# Patient Record
Sex: Female | Born: 1940 | Race: Black or African American | Hispanic: No | Smoking: Current some day smoker
Health system: Southern US, Community
[De-identification: ages and names within clinical notes are randomized; demographics above are authoritative.]

## PROBLEM LIST (undated history)

## (undated) DIAGNOSIS — F32A Depression, unspecified: Secondary | ICD-10-CM

## (undated) DIAGNOSIS — E785 Hyperlipidemia, unspecified: Secondary | ICD-10-CM

## (undated) DIAGNOSIS — M810 Age-related osteoporosis without current pathological fracture: Secondary | ICD-10-CM

## (undated) DIAGNOSIS — G459 Transient cerebral ischemic attack, unspecified: Secondary | ICD-10-CM

## (undated) DIAGNOSIS — D649 Anemia, unspecified: Secondary | ICD-10-CM

## (undated) DIAGNOSIS — T7840XA Allergy, unspecified, initial encounter: Secondary | ICD-10-CM

## (undated) DIAGNOSIS — K635 Polyp of colon: Secondary | ICD-10-CM

## (undated) DIAGNOSIS — M0520 Rheumatoid vasculitis with rheumatoid arthritis of unspecified site: Secondary | ICD-10-CM

## (undated) DIAGNOSIS — E079 Disorder of thyroid, unspecified: Secondary | ICD-10-CM

## (undated) DIAGNOSIS — C801 Malignant (primary) neoplasm, unspecified: Secondary | ICD-10-CM

## (undated) DIAGNOSIS — F329 Major depressive disorder, single episode, unspecified: Secondary | ICD-10-CM

## (undated) DIAGNOSIS — H269 Unspecified cataract: Secondary | ICD-10-CM

## (undated) DIAGNOSIS — I10 Essential (primary) hypertension: Secondary | ICD-10-CM

## (undated) DIAGNOSIS — I639 Cerebral infarction, unspecified: Secondary | ICD-10-CM

## (undated) HISTORY — DX: Major depressive disorder, single episode, unspecified: F32.9

## (undated) HISTORY — DX: Disorder of thyroid, unspecified: E07.9

## (undated) HISTORY — DX: Anemia, unspecified: D64.9

## (undated) HISTORY — PX: CATARACT EXTRACTION: SUR2

## (undated) HISTORY — DX: Transient cerebral ischemic attack, unspecified: G45.9

## (undated) HISTORY — DX: Rheumatoid vasculitis with rheumatoid arthritis of unspecified site: M05.20

## (undated) HISTORY — PX: TUMOR REMOVAL: SHX12

## (undated) HISTORY — DX: Allergy, unspecified, initial encounter: T78.40XA

## (undated) HISTORY — DX: Cerebral infarction, unspecified: I63.9

## (undated) HISTORY — DX: Unspecified cataract: H26.9

## (undated) HISTORY — DX: Polyp of colon: K63.5

## (undated) HISTORY — DX: Hyperlipidemia, unspecified: E78.5

## (undated) HISTORY — DX: Depression, unspecified: F32.A

## (undated) HISTORY — DX: Essential (primary) hypertension: I10

---

## 1974-11-09 HISTORY — PX: ABDOMINAL HYSTERECTOMY: SHX81

## 1993-11-09 DIAGNOSIS — C801 Malignant (primary) neoplasm, unspecified: Secondary | ICD-10-CM

## 1993-11-09 HISTORY — DX: Malignant (primary) neoplasm, unspecified: C80.1

## 2008-02-28 LAB — HM COLONOSCOPY

## 2009-07-01 LAB — CONVERTED CEMR LAB: Pap Smear: NORMAL

## 2009-08-13 ENCOUNTER — Ambulatory Visit (HOSPITAL_BASED_OUTPATIENT_CLINIC_OR_DEPARTMENT_OTHER): Admission: RE | Admit: 2009-08-13 | Discharge: 2009-08-13 | Payer: Self-pay | Admitting: Rheumatology

## 2009-08-13 ENCOUNTER — Ambulatory Visit: Payer: Self-pay | Admitting: Diagnostic Radiology

## 2009-12-17 ENCOUNTER — Ambulatory Visit: Payer: Self-pay | Admitting: Family

## 2009-12-17 ENCOUNTER — Telehealth: Payer: Self-pay | Admitting: Family

## 2009-12-17 DIAGNOSIS — M810 Age-related osteoporosis without current pathological fracture: Secondary | ICD-10-CM | POA: Insufficient documentation

## 2009-12-17 DIAGNOSIS — I10 Essential (primary) hypertension: Secondary | ICD-10-CM | POA: Insufficient documentation

## 2009-12-17 DIAGNOSIS — M069 Rheumatoid arthritis, unspecified: Secondary | ICD-10-CM | POA: Insufficient documentation

## 2009-12-17 DIAGNOSIS — E559 Vitamin D deficiency, unspecified: Secondary | ICD-10-CM | POA: Insufficient documentation

## 2009-12-17 DIAGNOSIS — F17200 Nicotine dependence, unspecified, uncomplicated: Secondary | ICD-10-CM | POA: Insufficient documentation

## 2009-12-17 DIAGNOSIS — F329 Major depressive disorder, single episode, unspecified: Secondary | ICD-10-CM

## 2009-12-17 DIAGNOSIS — R32 Unspecified urinary incontinence: Secondary | ICD-10-CM | POA: Insufficient documentation

## 2009-12-17 DIAGNOSIS — E785 Hyperlipidemia, unspecified: Secondary | ICD-10-CM | POA: Insufficient documentation

## 2009-12-17 DIAGNOSIS — F32A Depression, unspecified: Secondary | ICD-10-CM | POA: Insufficient documentation

## 2009-12-17 DIAGNOSIS — D509 Iron deficiency anemia, unspecified: Secondary | ICD-10-CM | POA: Insufficient documentation

## 2009-12-17 DIAGNOSIS — J309 Allergic rhinitis, unspecified: Secondary | ICD-10-CM | POA: Insufficient documentation

## 2009-12-18 ENCOUNTER — Ambulatory Visit: Payer: Self-pay | Admitting: Family

## 2009-12-18 LAB — CONVERTED CEMR LAB
ALT: 9 units/L (ref 0–35)
AST: 13 units/L (ref 0–37)
Albumin: 4.2 g/dL (ref 3.5–5.2)
Alkaline Phosphatase: 66 units/L (ref 39–117)
BUN: 13 mg/dL (ref 6–23)
Basophils Absolute: 0 10*3/uL (ref 0.0–0.1)
Basophils Relative: 0 % (ref 0–1)
Bilirubin, Direct: 0.1 mg/dL (ref 0.0–0.3)
CO2: 26 meq/L (ref 19–32)
Calcium: 10 mg/dL (ref 8.4–10.5)
Chloride: 106 meq/L (ref 96–112)
Cholesterol: 251 mg/dL — ABNORMAL HIGH (ref 0–200)
Creatinine, Ser: 0.81 mg/dL (ref 0.40–1.20)
Eosinophils Absolute: 0.3 10*3/uL (ref 0.0–0.7)
Eosinophils Relative: 4 % (ref 0–5)
Glucose, Bld: 89 mg/dL (ref 70–99)
HCT: 40.4 % (ref 36.0–46.0)
HDL: 42 mg/dL (ref >39)
Hemoglobin: 12.7 g/dL (ref 12.0–15.0)
Indirect Bilirubin: 0.6 mg/dL (ref 0.0–0.9)
LDL Cholesterol: 174 mg/dL — ABNORMAL HIGH (ref 0–99)
Lymphocytes Relative: 37 % (ref 12–46)
Lymphs Abs: 2.7 10*3/uL (ref 0.7–4.0)
MCHC: 31.4 g/dL (ref 30.0–36.0)
MCV: 88.4 fL (ref 78.0–100.0)
Monocytes Absolute: 0.6 10*3/uL (ref 0.1–1.0)
Monocytes Relative: 8 % (ref 3–12)
Neutro Abs: 3.7 10*3/uL (ref 1.7–7.7)
Neutrophils Relative %: 50 % (ref 43–77)
Platelets: 319 10*3/uL (ref 150–400)
Potassium: 4.5 meq/L (ref 3.5–5.3)
RBC: 4.57 M/uL (ref 3.87–5.11)
RDW: 16.1 % — ABNORMAL HIGH (ref 11.5–15.5)
Sodium: 143 meq/L (ref 135–145)
Total Bilirubin: 0.7 mg/dL (ref 0.3–1.2)
Total CHOL/HDL Ratio: 6
Total Protein: 7.3 g/dL (ref 6.0–8.3)
Triglycerides: 174 mg/dL — ABNORMAL HIGH (ref <150)
VLDL: 35 mg/dL (ref 0–40)
Vit D, 1,25-Dihydroxy: 17 — ABNORMAL LOW (ref 30–89)
WBC: 7.3 10*3/uL (ref 4.0–10.5)

## 2009-12-20 ENCOUNTER — Telehealth (INDEPENDENT_AMBULATORY_CARE_PROVIDER_SITE_OTHER): Payer: Self-pay | Admitting: *Deleted

## 2009-12-23 ENCOUNTER — Telehealth: Payer: Self-pay | Admitting: Family

## 2009-12-26 ENCOUNTER — Telehealth: Payer: Self-pay | Admitting: Family

## 2009-12-27 ENCOUNTER — Ambulatory Visit: Payer: Self-pay | Admitting: Family Medicine

## 2009-12-30 ENCOUNTER — Ambulatory Visit: Payer: Self-pay | Admitting: Internal Medicine

## 2010-01-01 ENCOUNTER — Ambulatory Visit: Payer: Self-pay

## 2010-01-01 ENCOUNTER — Telehealth: Payer: Self-pay | Admitting: Family

## 2010-01-01 ENCOUNTER — Ambulatory Visit: Payer: Self-pay | Admitting: Cardiology

## 2010-01-02 ENCOUNTER — Encounter: Payer: Self-pay | Admitting: Family

## 2010-01-15 ENCOUNTER — Ambulatory Visit: Payer: Self-pay | Admitting: Family

## 2010-01-15 LAB — CONVERTED CEMR LAB
BUN: 20 mg/dL (ref 6–23)
Basophils Absolute: 0 10*3/uL (ref 0.0–0.1)
Basophils Relative: 0 % (ref 0–1)
CO2: 28 meq/L (ref 19–32)
Calcium: 10.3 mg/dL (ref 8.4–10.5)
Chloride: 106 meq/L (ref 96–112)
Creatinine, Ser: 0.94 mg/dL (ref 0.40–1.20)
Eosinophils Absolute: 0.3 10*3/uL (ref 0.0–0.7)
Eosinophils Relative: 4 % (ref 0–5)
Glucose, Bld: 97 mg/dL (ref 70–99)
HCT: 40.4 % (ref 36.0–46.0)
Hemoglobin: 12.8 g/dL (ref 12.0–15.0)
INR: 1.04 (ref <1.50)
Lymphocytes Relative: 37 % (ref 12–46)
Lymphs Abs: 3.1 10*3/uL (ref 0.7–4.0)
MCHC: 31.7 g/dL (ref 30.0–36.0)
MCV: 88.2 fL (ref 78.0–100.0)
Monocytes Absolute: 0.6 10*3/uL (ref 0.1–1.0)
Monocytes Relative: 7 % (ref 3–12)
Neutro Abs: 4.4 10*3/uL (ref 1.7–7.7)
Neutrophils Relative %: 53 % (ref 43–77)
Platelets: 300 10*3/uL (ref 150–400)
Potassium: 4 meq/L (ref 3.5–5.3)
Prothrombin Time: 13.5 s (ref 11.6–15.2)
RBC: 4.58 M/uL (ref 3.87–5.11)
RDW: 15.2 % (ref 11.5–15.5)
Sodium: 143 meq/L (ref 135–145)
WBC: 8.4 10*3/uL (ref 4.0–10.5)
aPTT: 24 s (ref 24–37)

## 2010-01-16 ENCOUNTER — Encounter: Payer: Self-pay | Admitting: Family

## 2010-02-20 ENCOUNTER — Telehealth: Payer: Self-pay | Admitting: Family

## 2010-02-21 ENCOUNTER — Telehealth: Payer: Self-pay | Admitting: Family

## 2010-04-14 ENCOUNTER — Ambulatory Visit (HOSPITAL_BASED_OUTPATIENT_CLINIC_OR_DEPARTMENT_OTHER): Admission: RE | Admit: 2010-04-14 | Discharge: 2010-04-14 | Payer: Self-pay | Admitting: Internal Medicine

## 2010-04-14 ENCOUNTER — Ambulatory Visit: Payer: Self-pay | Admitting: Diagnostic Radiology

## 2010-04-14 ENCOUNTER — Ambulatory Visit: Payer: Self-pay | Admitting: Family

## 2010-04-14 LAB — CONVERTED CEMR LAB
ALT: 10 units/L (ref 0–35)
AST: 15 units/L (ref 0–37)
Albumin: 4.3 g/dL (ref 3.5–5.2)
Alkaline Phosphatase: 60 units/L (ref 39–117)
BUN: 13 mg/dL (ref 6–23)
CO2: 28 meq/L (ref 19–32)
Calcium: 9.9 mg/dL (ref 8.4–10.5)
Chloride: 104 meq/L (ref 96–112)
Creatinine, Ser: 0.76 mg/dL (ref 0.40–1.20)
Glucose, Bld: 87 mg/dL (ref 70–99)
Potassium: 4.6 meq/L (ref 3.5–5.3)
Sodium: 142 meq/L (ref 135–145)
Total Bilirubin: 0.5 mg/dL (ref 0.3–1.2)
Total Protein: 7.3 g/dL (ref 6.0–8.3)

## 2010-04-14 LAB — HM MAMMOGRAPHY: HM Mammogram: NEGATIVE

## 2010-04-21 ENCOUNTER — Encounter: Payer: Self-pay | Admitting: Family

## 2010-04-21 LAB — CONVERTED CEMR LAB
Cholesterol: 230 mg/dL — ABNORMAL HIGH (ref 0–200)
HDL: 41 mg/dL (ref >39)
LDL Cholesterol: 151 mg/dL — ABNORMAL HIGH (ref 0–99)
Total CHOL/HDL Ratio: 5.6
Triglycerides: 190 mg/dL — ABNORMAL HIGH (ref <150)
VLDL: 38 mg/dL (ref 0–40)

## 2010-04-22 ENCOUNTER — Encounter: Payer: Self-pay | Admitting: Family

## 2010-05-13 ENCOUNTER — Encounter: Payer: Self-pay | Admitting: Family

## 2010-07-21 ENCOUNTER — Ambulatory Visit: Payer: Self-pay | Admitting: Family

## 2010-07-21 LAB — CONVERTED CEMR LAB
ALT: 9 units/L (ref 0–35)
AST: 11 units/L (ref 0–37)
Albumin: 4.2 g/dL (ref 3.5–5.2)
Alkaline Phosphatase: 53 units/L (ref 39–117)
Bilirubin, Direct: 0.1 mg/dL (ref 0.0–0.3)
Cholesterol: 219 mg/dL — ABNORMAL HIGH (ref 0–200)
HDL: 51 mg/dL (ref >39)
Indirect Bilirubin: 0.4 mg/dL (ref 0.0–0.9)
LDL Cholesterol: 149 mg/dL — ABNORMAL HIGH (ref 0–99)
Total Bilirubin: 0.5 mg/dL (ref 0.3–1.2)
Total CHOL/HDL Ratio: 4.3
Total Protein: 6.9 g/dL (ref 6.0–8.3)
Triglycerides: 96 mg/dL (ref <150)
VLDL: 19 mg/dL (ref 0–40)

## 2010-07-22 ENCOUNTER — Telehealth: Payer: Self-pay | Admitting: Family

## 2010-08-18 ENCOUNTER — Ambulatory Visit: Payer: Self-pay | Admitting: Family

## 2010-08-18 LAB — CONVERTED CEMR LAB
BUN: 20 mg/dL (ref 6–23)
CO2: 28 meq/L (ref 19–32)
Calcium: 9.9 mg/dL (ref 8.4–10.5)
Chloride: 104 meq/L (ref 96–112)
Cholesterol: 205 mg/dL — ABNORMAL HIGH (ref 0–200)
Creatinine, Ser: 0.84 mg/dL (ref 0.40–1.20)
Glucose, Bld: 103 mg/dL — ABNORMAL HIGH (ref 70–99)
HDL: 47 mg/dL (ref >39)
LDL Cholesterol: 124 mg/dL — ABNORMAL HIGH (ref 0–99)
Potassium: 4.6 meq/L (ref 3.5–5.3)
Sodium: 143 meq/L (ref 135–145)
Total CHOL/HDL Ratio: 4.4
Triglycerides: 170 mg/dL — ABNORMAL HIGH (ref <150)
VLDL: 34 mg/dL (ref 0–40)

## 2010-08-19 ENCOUNTER — Encounter: Payer: Self-pay | Admitting: Family

## 2010-08-31 ENCOUNTER — Encounter: Payer: Self-pay | Admitting: Family

## 2010-09-01 ENCOUNTER — Encounter: Payer: Self-pay | Admitting: Family

## 2010-09-02 ENCOUNTER — Encounter: Payer: Self-pay | Admitting: Family

## 2010-09-08 ENCOUNTER — Encounter: Payer: Self-pay | Admitting: Family

## 2010-09-08 ENCOUNTER — Telehealth: Payer: Self-pay | Admitting: Family

## 2010-09-17 ENCOUNTER — Encounter: Payer: Self-pay | Admitting: Family

## 2010-09-17 ENCOUNTER — Ambulatory Visit: Payer: Self-pay | Admitting: Internal Medicine

## 2010-09-26 ENCOUNTER — Telehealth: Payer: Self-pay | Admitting: Family

## 2010-10-14 ENCOUNTER — Ambulatory Visit: Payer: Self-pay | Admitting: Family

## 2010-10-14 DIAGNOSIS — R413 Other amnesia: Secondary | ICD-10-CM | POA: Insufficient documentation

## 2010-10-14 DIAGNOSIS — R55 Syncope and collapse: Secondary | ICD-10-CM | POA: Insufficient documentation

## 2010-10-14 DIAGNOSIS — F411 Generalized anxiety disorder: Secondary | ICD-10-CM | POA: Insufficient documentation

## 2010-10-14 LAB — CONVERTED CEMR LAB
Folate: 15.9 ng/mL
Vitamin B-12: 213 pg/mL (ref 211–911)

## 2010-10-15 ENCOUNTER — Telehealth: Payer: Self-pay | Admitting: Family

## 2010-11-12 ENCOUNTER — Ambulatory Visit: Admit: 2010-11-12 | Payer: Self-pay | Admitting: Family

## 2010-11-12 ENCOUNTER — Ambulatory Visit
Admission: RE | Admit: 2010-11-12 | Discharge: 2010-11-12 | Payer: Self-pay | Source: Home / Self Care | Attending: Family | Admitting: Family

## 2010-11-12 LAB — CONVERTED CEMR LAB
Basophils Absolute: 0 10*3/uL (ref 0.0–0.1)
Basophils Relative: 0 % (ref 0–1)
Eosinophils Absolute: 0.3 10*3/uL (ref 0.0–0.7)
Eosinophils Relative: 4 % (ref 0–5)
HCT: 37.1 % (ref 36.0–46.0)
Hemoglobin: 11.6 g/dL — ABNORMAL LOW (ref 12.0–15.0)
Lymphocytes Relative: 28 % (ref 12–46)
Lymphs Abs: 2.6 10*3/uL (ref 0.7–4.0)
MCHC: 31.3 g/dL (ref 30.0–36.0)
MCV: 87.7 fL (ref 78.0–100.0)
Monocytes Absolute: 0.8 10*3/uL (ref 0.1–1.0)
Monocytes Relative: 9 % (ref 3–12)
Neutro Abs: 5.5 10*3/uL (ref 1.7–7.7)
Neutrophils Relative %: 59 % (ref 43–77)
Platelets: 299 10*3/uL (ref 150–400)
RBC: 4.23 M/uL (ref 3.87–5.11)
RDW: 15.8 % — ABNORMAL HIGH (ref 11.5–15.5)
WBC: 9.2 10*3/uL (ref 4.0–10.5)

## 2010-11-14 ENCOUNTER — Telehealth: Payer: Self-pay | Admitting: Family

## 2010-11-14 ENCOUNTER — Encounter: Payer: Self-pay | Admitting: Family

## 2010-11-14 LAB — CONVERTED CEMR LAB
Ferritin: 28 ng/mL (ref 10–291)
Folate: 13.5 ng/mL
Iron: 57 ug/dL (ref 42–145)
Saturation Ratios: 18 % — ABNORMAL LOW (ref 20–55)
TIBC: 320 ug/dL (ref 250–470)
UIBC: 263 ug/dL
Vitamin B-12: 188 pg/mL — ABNORMAL LOW (ref 211–911)

## 2010-11-19 ENCOUNTER — Telehealth: Payer: Self-pay | Admitting: Family

## 2010-11-19 DIAGNOSIS — D518 Other vitamin B12 deficiency anemias: Secondary | ICD-10-CM | POA: Insufficient documentation

## 2010-11-28 ENCOUNTER — Other Ambulatory Visit: Payer: Self-pay | Admitting: Family

## 2010-11-28 ENCOUNTER — Ambulatory Visit
Admission: RE | Admit: 2010-11-28 | Discharge: 2010-11-28 | Payer: Self-pay | Source: Home / Self Care | Attending: Family | Admitting: Family

## 2010-11-28 LAB — FECAL OCCULT BLOOD, IMMUNOCHEMICAL: Fecal Occult Bld: NEGATIVE

## 2010-11-30 ENCOUNTER — Encounter: Payer: Self-pay | Admitting: Family

## 2010-12-09 NOTE — Assessment & Plan Note (Signed)
Summary: headache/swh   Vital Signs:  Patient profile:   70 year old female Weight:      143 pounds Pulse rate:   62 / minute Pulse rhythm:   regular BP sitting:   140 / 88  (left arm) Cuff size:   regular  Vitals Entered By: Army Fossa CMA (December 27, 2009 1:28 PM) CC: Pt states she is still having real bad HA on the left side ongoing over the past 3 weeks has been on an ATB(amoxicillin) , URI symptoms Comments Saw melissa originally for this   History of Present Illness:       This is a 70 year old woman who presents with URI symptoms.  The symptoms began 3 weeks ago.  Pt took amoxicillin ---see last visit.  The patient complains of nasal congestion, purulent nasal discharge, sore throat, productive cough, and earache.  Associated symptoms include fever.  The patient denies low-grade fever (<100.5 degrees), fever of 100.5-103 degrees, fever of 103.1-104 degrees, fever to >104 degrees, stiff neck, dyspnea, wheezing, rash, vomiting, diarrhea, use of an antipyretic, and response to antipyretic.  The patient also reports headache.  The patient denies itchy watery eyes, itchy throat, sneezing, seasonal symptoms, response to antihistamine, muscle aches, and severe fatigue.  Risk factors for Strep sinusitis include unilateral facial pain.  The patient denies the following risk factors for Strep sinusitis: unilateral nasal discharge, poor response to decongestant, double sickening, tooth pain, Strep exposure, tender adenopathy, and absence of cough.    Current Medications (verified): 1)  Metoprolol Succinate 50 Mg Xr24h-Tab (Metoprolol Succinate) .... Take 1 Tablet By Mouth Once A Day 2)  Methotrexate 2.5 Mg Tabs (Methotrexate Sodium) .... 8 Tablets By Mouth Once Weekly 3)  Pravastatin Sodium 40 Mg Tabs (Pravastatin Sodium) .... Take 1 Tab By Mouth At Bedtime 4)  Folic Acid 1 Mg Tabs (Folic Acid) .... Take 1 Tablet By Mouth Once A Day 5)  Caltrate 600+d 600-400 Mg-Unit Tabs (Calcium  Carbonate-Vitamin D) .... One Tablet By Mouth Bid 6)  Zyban 150 Mg Xr12h-Tab (Bupropion Hcl (Smoking Deter)) .... One Tab By Mouth Daily X 3 Days, Then Increase To One Tablet By Mouth Two Times A Day X 7-12 Weeks 7)  Pravastatin Sodium 40 Mg Tabs (Pravastatin Sodium) .... One Tab By Mouth Daily At Bedtime 8)  Levaquin 500 Mg Tabs (Levofloxacin) .Marland Kitchen.. 1 By Mouth Once Daily 9)  Prednisone 10 Mg Tabs (Prednisone) .... 3 By Mouth Once Daily For 3 Days Then 2 By Mouth Once Daily For 3 Days Then 1 By Mouth Once Daily For 3 Days  Allergies (verified): No Known Drug Allergies  Past History:  Past medical, surgical, family and social histories (including risk factors) reviewed for relevance to current acute and chronic problems.  Past Medical History: Reviewed history from 12/17/2009 and no changes required. Allergic rhinitis Anemia-iron deficiency Depression Hyperlipidemia Hypertension Urinary incontinence Rheumatoid arthritis  Past Surgical History: Reviewed history from 12/17/2009 and no changes required. Hysterectomy  Family History: Reviewed history from 12/17/2009 and no changes required. Family History of Arthritis Family History of CAD Female 1st degree relative <50 Family History High cholesterol Family History Hypertension Family History of Stroke F 1st degree relative <60  Mom- deceased at 70, had colostomy due to severe GI bleed Dad-deceased at 89, HTN, ?cancer sister- CABG x 3 at age 68.  Brother- cirrhosis, ETOH abuse 2 additional sisters- one with arthritis (osteoarthritis) one sister with HTN  Daugher- colitis Son with HTN, kidney transplant- due to  kidney problems after deset storm. 3 daughters with thyroid problems  Social History: Reviewed history from 12/17/2009 and no changes required. Divorced 3 sons  5 daughters Retired from [employer] denies ETOH 1/2 PPD x 32 years Denies drug use.  Review of Systems      See HPI  Physical Exam  General:   Well-developed,well-nourished,in no acute distress; alert,appropriate and cooperative throughout examination Ears:  External ear exam shows no significant lesions or deformities.  Otoscopic examination reveals clear canals, tympanic membranes are intact bilaterally without bulging, retraction, inflammation or discharge. Hearing is grossly normal bilaterally. Nose:  L frontal sinus tenderness and L maxillary sinus tenderness.   Mouth:  Oral mucosa and oropharynx without lesions or exudates.  Teeth in good repair. Neck:  No deformities, masses, or tenderness noted. Lungs:  Normal respiratory effort, chest expands symmetrically. Lungs are clear to auscultation, no crackles or wheezes. Heart:  normal rate and no murmur.   Psych:  Oriented X3 and normally interactive.     Impression & Recommendations:  Problem # 1:  SINUSITIS (ICD-473.9)  Her updated medication list for this problem includes:    Levaquin 500 Mg Tabs (Levofloxacin) .Marland Kitchen... 1 by mouth once daily  Take antibiotics for full duration. Discussed treatment options including indications for coronal CT scan of sinuses and ENT referral.   Complete Medication List: 1)  Metoprolol Succinate 50 Mg Xr24h-tab (Metoprolol succinate) .... Take 1 tablet by mouth once a day 2)  Methotrexate 2.5 Mg Tabs (Methotrexate sodium) .... 8 tablets by mouth once weekly 3)  Pravastatin Sodium 40 Mg Tabs (Pravastatin sodium) .... Take 1 tab by mouth at bedtime 4)  Folic Acid 1 Mg Tabs (Folic acid) .... Take 1 tablet by mouth once a day 5)  Caltrate 600+d 600-400 Mg-unit Tabs (Calcium carbonate-vitamin d) .... One tablet by mouth bid 6)  Zyban 150 Mg Xr12h-tab (Bupropion hcl (smoking deter)) .... One tab by mouth daily x 3 days, then increase to one tablet by mouth two times a day x 7-12 weeks 7)  Pravastatin Sodium 40 Mg Tabs (Pravastatin sodium) .... One tab by mouth daily at bedtime 8)  Levaquin 500 Mg Tabs (Levofloxacin) .Marland Kitchen.. 1 by mouth once daily 9)   Prednisone 10 Mg Tabs (Prednisone) .... 3 by mouth once daily for 3 days then 2 by mouth once daily for 3 days then 1 by mouth once daily for 3 days  Other Orders: Radiology Referral (Radiology) Admin of Therapeutic Inj  intramuscular or subcutaneous (16109) Depo- Medrol 80mg  (J1040) Prescriptions: PREDNISONE 10 MG TABS (PREDNISONE) 3 by mouth once daily for 3 days then 2 by mouth once daily for 3 days then 1 by mouth once daily for 3 days  #18 x 0   Entered and Authorized by:   Loreen Freud DO   Signed by:   Loreen Freud DO on 12/27/2009   Method used:   [facility] [phone]* (retail)       [address] [place]  [address]       Ph: [phone] or [phone]       Fax: [phone]   RxID:   [id number] LEVAQUIN 500 MG TABS (LEVOFLOXACIN) 1 by mouth once daily  #10 x 0   Entered and Authorized by:   Loreen Freud DO   Signed by:   Loreen Freud DO on 12/27/2009   Method used:   [facility]  to        [facility]. [phone]* (retail)       [address] [place]  [address]       Ph: [phone] or [phone]       Fax: [phone]   RxID:   [id number]    Medication Administration  Injection # 1:    Medication: Depo- Medrol 80mg     Diagnosis: HEADACHE (ICD-784.0)    Route: IM    Site: RUOQ gluteus    Exp Date: 12/2010    Lot #: Madelaine Bhat    Mfr: novaplus    Patient tolerated injection without complications    Given by: Army Fossa CMA (December 27, 2009 2:11 PM)  Orders Added: 1)  Radiology Referral [Radiology] 2)  Admin of Therapeutic Inj  intramuscular or subcutaneous [96372] 3)  Depo- Medrol 80mg  [J1040] 4)  Est. Patient Level III [36644]

## 2010-12-09 NOTE — Letter (Signed)
Summary: [facility]  [facility]   Imported By: Lanelle Bal 05/26/2010 08:42:28  _____________________________________________________________________  External Attachment:    Type:   Image     Comment:   External Document

## 2010-12-09 NOTE — Assessment & Plan Note (Signed)
Summary: 1 MONTH FOLLOW UP/[facility]--Rm 5   Vital Signs:  Patient profile:   70 year old female Height:      66.5 inches Weight:      144.50 pounds BMI:     23.06 Temp:     97.8 degrees F oral Pulse rate:   66 / minute Pulse rhythm:   regular Resp:     16 per minute BP sitting:   134 / 82  (right arm) Cuff size:   regular  Vitals Entered By: Mervin Kung CMA Duncan Dull) (August 18, 2010 8:01 AM) CC: rm 5   1 month f/u, Hypertension Management Is Patient Diabetic? No Pain Assessment Patient in pain? no      Comments Pt states she has had intermittent onstipation x 2 yrs but recently is getting worse.  Pt will need rx for Crestor sent to pharmacy. Pt completed Pravastatin.  Nicki Guadalajara Fergerson CMA Duncan Dull)  August 18, 2010 8:08 AM    Primary Care Provider:  Lemont Fillers FNP  CC:  rm 5   1 month f/u and Hypertension Management.  History of Present Illness: Ms Sallas is a 70 year old female who presents today for follow up.    1. Hyperlipidemia-Denies muscle pain, reports that she has been compliant with the Pravastatin 80mg .  "I might have missed one or two doses."  2. RA- Pt is following with Dr. Corliss Skains- on Methotrexate.  Feels like this is well controlled  3. Constipation-  notes that she sometimes will go as long as one week without a BM  4. Osteoporosis- was on fosamax briefly, discontinued due to  concern re: atypical fractures.   Hypertension History:      She complains of headache, but denies chest pain, palpitations, dyspnea with exertion, and peripheral edema.  Further comments include: mild sinus HA.        Positive major cardiovascular risk factors include female age 50 years old or older, hyperlipidemia, hypertension, and current tobacco user.     Allergies: No Known Drug Allergies  Past History:  Past Medical History: Last updated: 12/17/2009 Allergic rhinitis Anemia-iron deficiency Depression Hyperlipidemia Hypertension Urinary  incontinence Rheumatoid arthritis  Past Surgical History: Last updated: 12/17/2009 Hysterectomy  Review of Systems       see HPI  Physical Exam  General:  Well-developed,well-nourished,in no acute distress; alert,appropriate and cooperative throughout examination Lungs:  Normal respiratory effort, chest expands symmetrically. Lungs are clear to auscultation, no crackles or wheezes. Heart:  Normal rate and regular rhythm. S1 and S2 normal without gallop, murmur, click, rub or other extra sounds. Abdomen:  Bowel sounds positive,abdomen soft and non-tender without masses, organomegaly or hernias noted.   Impression & Recommendations:  Problem # 1:  CONSTIPATION (ICD-564.00)  Her updated medication list for this problem includes:    Metamucil 0.52 Gm Caps (Psyllium) ..... Start 2 caps by mouth once daily.  may increase up to 5 caps once daily as needed for regularity  Problem # 2:  HYPERTENSION (ICD-401.9)  Her updated medication list for this problem includes:    Metoprolol Succinate 50 Mg Xr24h-tab (Metoprolol succinate) .Marland Kitchen... Take 1 tablet by mouth once a day    Hydrochlorothiazide 25 Mg Tabs (Hydrochlorothiazide) ..... One tablet by mouth daily  Orders: TLB-BMP (Basic Metabolic Panel-BMET) (80048-METABOL) Prescription Created Electronically [phone])  Problem # 3:  HYPERLIPIDEMIA (ICD-272.4)  The following medications were removed from the medication list:    Crestor 10 Mg Tabs (Rosuvastatin calcium) ..... One tablet by mouth daily Her  updated medication list for this problem includes:    Pravastatin Sodium 80 Mg Tabs (Pravastatin sodium) ..... One tab by mouth once daily in the evening  Orders: TLB-Lipid Panel (80061-LIPID)  Problem # 4:  OSTEOPOROSIS (ICD-733.00) Assessment: Comment Only Will order f/u dexascan, may consider prolia or reclast.  Complete Medication List: 1)  Metoprolol Succinate 50 Mg Xr24h-tab (Metoprolol succinate) .... Take 1 tablet by mouth once  a day 2)  Methotrexate 2.5 Mg Tabs (Methotrexate sodium) .... 8 tablets by mouth once weekly 3)  Folic Acid 1 Mg Tabs (Folic acid) .... Take 1 tablet by mouth once a day 4)  Caltrate 600+d 600-400 Mg-unit Tabs (Calcium carbonate-vitamin d) .... One tablet by mouth bid 5)  Hydrochlorothiazide 25 Mg Tabs (Hydrochlorothiazide) .... One tablet by mouth daily 6)  Claritin 10 Mg Caps (Loratadine) .... One tablet by mouth daily as needed for allergies 7)  Metamucil 0.52 Gm Caps (Psyllium) .... Start 2 caps by mouth once daily.  may increase up to 5 caps once daily as needed for regularity 8)  Pravastatin Sodium 80 Mg Tabs (Pravastatin sodium) .... One tab by mouth once daily in the evening  Hypertension Assessment/Plan:      The patient's hypertensive risk group is category B: At least one risk factor (excluding diabetes) with no target organ damage.  Her calculated 10 year risk of coronary heart disease is 13 %.  Today's blood pressure is 134/82.  Her blood pressure goal is < 140/90.  Patient Instructions: 1)  Please schedule a bone density test at the front desk. 2)  Please complete your blood work today on the first floor. 3)  Call if your constipation does not improve with the metamucil and increased water. 4)  Follow up in 3 months for a medicare wellness visit. Prescriptions: PRAVASTATIN SODIUM 80 MG TABS (PRAVASTATIN SODIUM) one tab by mouth once daily in the evening  #30 x 3   Entered and Authorized by:   Lemont Fillers FNP   Signed by:   Lemont Fillers FNP on 08/18/2010   Method used:   [facility] to        [facility]. [phone]* (retail)       [address] [place]  [address]       Ph: [phone] or [phone]       Fax: [phone]   RxID:   [id number] METOPROLOL SUCCINATE 50 MG XR24H-TAB (METOPROLOL SUCCINATE) Take 1 tablet by mouth once a day  #30 x 3   Entered and Authorized by:   Lemont Fillers FNP   Signed by:   Lemont Fillers  FNP on 08/18/2010   Method used:   [facility] to        [facility]. [phone]* (retail)       [address]       [place], [place]  [address]       Ph: [phone] or [phone]       Fax: [phone]   RxID:   [id number]

## 2010-12-09 NOTE — Assessment & Plan Note (Signed)
Summary: 3 MONTH FOLLOW UP/[facility]--room 4    Vital Signs:  Patient profile:   70 year old female Height:      66.5 inches Weight:      143.50 pounds BMI:     22.90 Temp:     98.0 degrees F oral Pulse rate:   72 / minute Pulse rhythm:   regular Resp:     16 per minute BP sitting:   142 / 78  (left arm) Cuff size:   regular  Vitals Entered By: Mervin Kung CMA (April 14, 2010 8:14 AM) CC: room 4  3 month follow up. Needs to schedule mammogram Is Patient Diabetic? No   Primary Care Provider:  Laury Axon  CC:  room 4  3 month follow up. Needs to schedule mammogram.  History of Present Illness: Nancy [name] is a 70 year old female who presents today for follow up of her HTN.  She reports + compliance with her medications.  She has no complaints today.  Reports that she has been following with Dr. Corliss Skains for her RA which is well controlled on methotrexate- just notes some stiffness in her hands.    Allergies (verified): No Known Drug Allergies  Physical Exam  General:  Well-developed,well-nourished,in no acute distress; alert,appropriate and cooperative throughout examination Lungs:  Normal respiratory effort, chest expands symmetrically. Lungs are clear to auscultation, no crackles or wheezes. Heart:  Normal rate and regular rhythm. S1 and S2 normal without gallop, murmur, click, rub or other extra sounds.   Impression & Recommendations:  Problem # 1:  HYPERTENSION (ICD-401.9)  Repeat BP in the room today was 132/82.  Will continue current medications, check CMET today to evaluate electrolytes and LFT's (on statin) Her updated medication list for this problem includes:    Metoprolol Succinate 50 Mg Xr24h-tab (Metoprolol succinate) .Marland Kitchen... Take 1 tablet by mouth once a day    Hydrochlorothiazide 25 Mg Tabs (Hydrochlorothiazide) ..... One tablet by mouth daily  Orders: T-Comprehensive Metabolic Panel [id number]) Prescription Created Electronically [id number])  Problem # 2:   HYPERLIPIDEMIA (ICD-272.4) Assessment: Comment Only Pt will return fasting for FLP Her updated medication list for this problem includes:    Pravastatin Sodium 40 Mg Tabs (Pravastatin sodium) ..... One tab by mouth daily at bedtime  Orders: T-Comprehensive Metabolic Panel ([id number])Future Orders: T-Lipid Profile ([id number]) ... 04/18/2010  Labs Reviewed: SGOT: 13 (12/18/2009)   SGPT: 9 (12/18/2009)  Prior 10 Yr Risk Heart Disease: > 32 % (01/01/2010)   HDL:42 (12/18/2009)  LDL:174 (12/18/2009)  Chol:251 (12/18/2009)  Trig:174 (12/18/2009)  Problem # 3:  ARTHRITIS, RHEUMATOID (ICD-714.0) Assessment: Unchanged Patient follows with Dr. Corliss Skains, stable on methotrexate- defer management to rheumatology.  Complete Medication List: 1)  Metoprolol Succinate 50 Mg Xr24h-tab (Metoprolol succinate) .... Take 1 tablet by mouth once a day 2)  Methotrexate 2.5 Mg Tabs (Methotrexate sodium) .... 8 tablets by mouth once weekly 3)  Folic Acid 1 Mg Tabs (Folic acid) .... Take 1 tablet by mouth once a day 4)  Caltrate 600+d 600-400 Mg-unit Tabs (Calcium carbonate-vitamin d) .... One tablet by mouth bid 5)  Pravastatin Sodium 40 Mg Tabs (Pravastatin sodium) .... One tab by mouth daily at bedtime 6)  Hydrochlorothiazide 25 Mg Tabs (Hydrochlorothiazide) .... One tablet by mouth daily 7)  Claritin 10 Mg Caps (Loratadine) .... One tablet by mouth daily as needed for allergies  Other Orders: Mammogram (Screening) (Mammo)  Patient Instructions: 1)  Please complete your mammogram downstairs in the Imaging Center-first floor. 2)  Complete  blood work today Advice worker lab partners. 3)  Please return fasting for a follow up cholesterol 4)  Follow up in 3 months for an appointment. Prescriptions: PRAVASTATIN SODIUM 40 MG TABS (PRAVASTATIN SODIUM) one tab by mouth daily at bedtime  #30 x 3   Entered and Authorized by:   Lemont Fillers FNP   Signed by:   Lemont Fillers FNP on  04/14/2010   Method used:   [facility] to        [facility]. [phone]* (retail)       [address], [place]  [address]       Ph: [phone] or [phone]       Fax: [phone]   RxID:   [id number] METOPROLOL SUCCINATE 50 MG XR24H-TAB (METOPROLOL SUCCINATE) Take 1 tablet by mouth once a day  #30 x 2   Entered and Authorized by:   Lemont Fillers FNP   Signed by:   Lemont Fillers FNP on 04/14/2010   Method used:   [facility] to        [facility]. [phone]* (retail)       [address]       [place], [place]  [address]       Ph: [phone] or [phone]       Fax: [phone]   RxID:   [id number]   Current Allergies (reviewed today): No known allergies

## 2010-12-09 NOTE — Progress Notes (Signed)
  Phone Note Outgoing Call   Summary of Call: Please call Ms Cade and let her know that I have reviewed Dr. Alford Highland rec's from the stress test.  Her BP was high.  I would like to add a BP med and have her return in 2 weeks for follow up appointment Initial call taken by: Lemont Fillers FNP,  January 01, 2010 12:49 PM  Follow-up for Phone Call        Pt notified to add HCTZ to current meds. Has f/u 01-15-10/ Follow-up by: Mervin Kung CMA,  January 01, 2010 3:03 PM    New/Updated Medications: HYDROCHLOROTHIAZIDE 25 MG TABS (HYDROCHLOROTHIAZIDE) one tablet by mouth daily Prescriptions: HYDROCHLOROTHIAZIDE 25 MG TABS (HYDROCHLOROTHIAZIDE) one tablet by mouth daily  #30 x 0   Entered and Authorized by:   Lemont Fillers FNP   Signed by:   Lemont Fillers FNP on 01/01/2010   Method used:   [facility]ron[facility] to        [facility]. [phone]* (retail)       [address]       [place], [place]  [address]       Ph: [phone] or [phone]       Fax: [phone]   RxID:   [id number]

## 2010-12-09 NOTE — Progress Notes (Signed)
Summary: discuss labs  Phone Note Outgoing Call   Summary of Call: Please call patient and verify that she is taking pravastatin, if so, please ask her to increase dose as below.  She should follow up in 1 month for LFT's, FLP (280.9) thanks Initial call taken by: Lemont Fillers FNP,  December 20, 2009 11:58 PM  Follow-up for Phone Call        left message on machine .......Marland KitchenDoristine [name]  December 23, 2009 2:56 PM   spoke w/ patient says she hasn't been taken pravastatin regularly so informed patient instead of increasing dose to make sure she is taking medication daily and f/u for recheck at next office visit patient agreed........Marland KitchenDoristine [name]  December 23, 2009 4:47 PM     New/Updated Medications: PRAVASTATIN SODIUM 40 MG TABS (PRAVASTATIN SODIUM) one and 1/2 tabs by mouth daily at bedtime PRAVASTATIN SODIUM 40 MG TABS (PRAVASTATIN SODIUM) one tab by mouth daily at bedtime Prescriptions: PRAVASTATIN SODIUM 40 MG TABS (PRAVASTATIN SODIUM) one and 1/2 tabs by mouth daily at bedtime  #45 x 0   Entered and Authorized by:   Lemont Fillers FNP   Signed by:   Lemont Fillers FNP on 12/21/2009   Method used:   [facility]        [facility] [phone]* (retail)       [address]       [place], [place]  [address]       Ph: [phone] or [phone]       Fax: [phone]   RxID:   [id number]

## 2010-12-09 NOTE — Progress Notes (Signed)
Summary: lab result  Phone Note Outgoing Call   Summary of Call: Please call patient and let her know that her lab work is all normal.  B12 is on the low end of normal.  I would like for her to add a daily OTC B12 vitamin as below. Initial call taken by: Lemont Fillers FNP,  October 15, 2010 8:24 AM  Follow-up for Phone Call        Pt informed. Nicki Guadalajara Fergerson CMA Duncan Dull)  October 15, 2010 9:54 AM     New/Updated Medications: VITAMIN B-12 250 MCG TABS (CYANOCOBALAMIN) one tablet by mouth once daily

## 2010-12-09 NOTE — Letter (Signed)
   [place] at [facility] [address] [place], [place]  [address]  [place] Phone: [phone]      April 22, 2010   Nancy [name] [address] [place], [place] [address]  RE:  LAB RESULTS  Dear  Ms. STUDENT,  The following is an interpretation of your most recent lab tests.  Please take note of any instructions provided or changes to medications that have resulted from your lab work.  Health professionals look at cholesterol as more involved than just the total cholesterol. We consider the level of LDL (bad) cholesterol, HDL (good), cholesterol, and Triglycerides (Grease) in the blood.  1. Your LDL should be under 100, and the HDL should be over 45, if you have any vascular disease such as heart attack, angina, stroke, TIA (mini stroke), claudication (pain in the legs when you walk due to poor circulation),  Abdominal Aortic Aneurysm (AAA), diabetes or prediabetes.  2. Your LDL should be under 130 if you have any two of the following:     a. Smoke or chew tobacco,     b. High blood pressure (if you are on medication or over 140/90 without medication),     c. Female gender,    d. HDL below 40,    e. A female relative (father, brother, or son), who have had any vascular event          as described in #1. above under the age of 41, or a female relative (mother,       sister, or daughter) who had an event as described above under age 60. (An HDL over 60 will subtract one risk factor from the total, so if you have two items in # 2 above, but an HDL over 60, you then fall into category # 3 below).  3. Your LDL should be under 160 if you have any one of the above.  Triglycerides should be under 200 with the ideal being under 150.  For diabetes or pre-diabetes, the ideal HgbA1C should be under 6.0%.  If you fall into any of the above categories, you should make a follow up appointment to discuss this with your physician.  LIPID PANEL:  Fair - review at your next visit,  Please see the enclosed Rx for a change in medication Triglyceride: 190   Cholesterol: 230   LDL: 151   HDL: 41   Chol/HDL%:  5.6 Ratio  Please call my office to make arrangements for further tests or an appointment. Please call to arrange a follow up appointment for fasting lab work in 1 month.  I have increased your pravastatin from 40 mg to 80 mg to try to get your cholesterol to goal.  This has been sent to your pharmacy.   Medications Prescribed or Changed PRAVACHOL 80 MG TABS (PRAVASTATIN SODIUM) one tablet by mouth daily   Sincerely Yours,    Lemont Fillers FNP

## 2010-12-09 NOTE — Letter (Signed)
Summary: Imaging Options Form/[facility]  Imaging Options Form/[facility]   Imported By: Lanelle Bal 01/01/2010 11:19:29  _____________________________________________________________________  External Attachment:    Type:   Image     Comment:   External Document

## 2010-12-09 NOTE — Assessment & Plan Note (Signed)
Summary: hosp f/u / tf,cma--Rm 5   Vital Signs:  Patient profile:   70 year old female Height:      66.5 inches Weight:      142.75 pounds BMI:     22.78 Temp:     97.9 degrees F oral Pulse rate:   72 / minute Pulse rhythm:   regular Resp:     16 per minute BP sitting:   110 / 70  (right arm) Cuff size:   regular  Vitals Entered By: Mervin Kung CMA Duncan Dull) (October 14, 2010 8:07 AM) CC: Pt here for hospital f/u of low BP., Depression Is Patient Diabetic? No Pain Assessment Patient in pain? no      Comments HCTZ 25mg  was changed to 1/2 tablet daily in the hospital. Pt agrees all other med doses and directions are correct. Nicki Guadalajara Fergerson CMA Duncan Dull)  October 14, 2010 8:14 AM    Primary Care Provider:  Lemont Fillers FNP  CC:  Pt here for hospital f/u of low BP. and Depression.  History of Present Illness: Ms.  Klar is a 70 year old female who present today for hospital follow up of a Vasovagal syncopal event.  1. Syncope- She was hospitalized 10/22-10/24.  During this hospitalization she underwent a CT scan of her head which showed no acute abnormality. She also had bilateral carotid artery dopplers which were negative for ICA stenosis. A 2-D echo was performed which revealed an ejection fraction of 60-65% with a normal left ventricular size and systolic function. She had 3 sets of negative cardiac enzymes. It was thought that her vasovagal event may have been due to dehydration. As result,  her hydrochlorothiazide was placed on hold throughout her admission. Upon discharge they recommended that she take a half tablet of her hydrochlorothiazide.  Since discharge, she notes that she has had some lightheadeded- she usually notes that this occurs on the days when she takes HCTZ.  No further syncope or near syncope.  Denies associated palpitations/chest pain or shortness of breath.    2) Anxiety- notes some difficulty focusing on tasks,  forgot number of mail boxes.   Goes somewhere can't remeber how she got there.  Forgets directions on how to drive somewhere.   Difficulty remembering names.  Mother had alzheimers and PD.  She has been hospitalized for depression in the past with "mental breakdown."  Some difficulty sleeping.  Denies suicidal ideation.  Panic attacks mostly at night.   3) Tobacco abuse- still smoking 1/2 PPD   Allergies (verified): No Known Drug Allergies  Past History:  Past Medical History: Last updated: 12/17/2009 Allergic rhinitis Anemia-iron deficiency Depression Hyperlipidemia Hypertension Urinary incontinence Rheumatoid arthritis  Past Surgical History: Last updated: 12/17/2009 Hysterectomy  Family History: Reviewed history from 12/17/2009 and no changes required. Family History of Arthritis Family History of CAD Female 1st degree relative <50 Family History High cholesterol Family History Hypertension Family History of Stroke F 1st degree relative <60  Mom- deceased at 9, had colostomy due to severe GI bleed Dad-deceased at 57, HTN, ?cancer sister- CABG x 3 at age 74.  Brother- cirrhosis, ETOH abuse 2 additional sisters- one with arthritis (osteoarthritis) one sister with HTN  Daugher- colitis Son with HTN, kidney transplant- due to kidney problems after deset storm. 3 daughters with thyroid problems  Social History: Reviewed history from 12/17/2009 and no changes required. Divorced 3 sons  5 daughters Retired from [employer] denies ETOH 1/2 PPD x 32 years Denies drug  use.  Review of Systems       Constitutional: Denies Fever ENT:  Denies nasal congestion or sore throat. Resp: Denies cough CV:  Denies Chest Pain GI:  Denies nausea or vomitting GU: Denies dysuria Lymphatic: some tenderness left neck Musculoskeletal:  feet, hands, shoulders, feet Skin:  Denies Rashes Psychiatric: Denies depression Neuro: Denies numbness     Physical Exam  General:  Well-developed,well-nourished,in  no acute distress; alert,appropriate and cooperative throughout examination Head:  Normocephalic and atraumatic without obvious abnormalities. No apparent alopecia or balding. Ears:  L ear lobe with healed tear from earing. Lungs:  Normal respiratory effort, chest expands symmetrically. Lungs are clear to auscultation, no crackles or wheezes. Heart:  Normal rate and regular rhythm. S1 and S2 normal without gallop, murmur, click, rub or other extra sounds. Extremities:  No peripheral edema Skin:  No rashes or induration noted. Psych:  Cognition and judgment appear intact. Alert and cooperative with normal attention span and concentration. No apparent delusions, illusions, hallucinations   Impression & Recommendations:  Problem # 1:  VASOVAGAL SYNCOPE (ICD-780.2) Assessment Improved No further syncope since her discharge.  Inpatient evaluation was unremarkable. She is currently taking 1/2 tab of HCTZ, will plan to stop HCTZ completely as her blood pressure is on the low side and she reports some light headedness on the days she takes it.  Records from hospitalization were reviewed.   Problem # 2:  HYPERTENSION (ICD-401.9) D/C HCTZ The following medications were removed from the medication list:    Hydrochlorothiazide 25 Mg Tabs (Hydrochlorothiazide) ..... One tablet by mouth daily Her updated medication list for this problem includes:    Metoprolol Succinate 50 Mg Xr24h-tab (Metoprolol succinate) .Marland Kitchen... Take 1 tablet by mouth once a day  BP today: 110/70 Prior BP: 134/82 (08/18/2010)  Prior 10 Yr Risk Heart Disease: 13 % (08/18/2010)  Labs Reviewed: K+: 4.6 (08/18/2010) Creat: : 0.84 (08/18/2010)   Chol: 205 (08/18/2010)   HDL: 47 (08/18/2010)   LDL: 124 (08/18/2010)   TG: 170 (08/18/2010)  Problem # 3:  ANXIETY (ICD-300.00) Assessment: Deteriorated  Suspect that her "memory loss" is due to anxiety.  See discussion below. Will give her a trial of sertraline to see if this helps. 45  minutes were spent with the patient today.  Greater than 50% of this time was spent counseling patient on her memory loss/anxiety Her updated medication list for this problem includes:    Sertraline Hcl 50 Mg Tabs (Sertraline hcl) .Marland Kitchen... 1/2 tablet by mouth once daily for 1 week, then increase to 1 tablet by mouth daily on the second week  Orders: Prescription Created Electronically [phone])  Problem # 4:  MEMORY LOSS (ICD-780.93) Assessment: Comment Only Check baseline laboratories as below.  MMSE  was performed today.  Patient scored 25/30.  She has only an 8th grade education.  No clear cognitive impairment at this time.  Suspect that her subjective symptoms are due to anxiety. Given family history of alzheimers, consider repeating annually. Orders: TLB-B12 + Folate Pnl ([id number]-B12/FOL) [facility] ([id number])  Problem # 5:  OSTEOPOROSIS (ICD-733.00) Assessment: Comment Only Patient is now agreeable to resuming fosamax.  She has been counseled on risks and benefits Her updated medication list for this problem includes:    Alendronate Sodium 70 Mg Tabs (Alendronate sodium) ..... One tablet by mouth once weekly on an empty stomach.  sit upright, no food or other medicines for 30 minutes after dose.  Complete Medication List: 1)  Metoprolol Succinate 50 Mg Xr24h-tab (Metoprolol  succinate) .... Take 1 tablet by mouth once a day 2)  Methotrexate 2.5 Mg Tabs (Methotrexate sodium) .... 8 tablets by mouth once weekly 3)  Folic Acid 1 Mg Tabs (Folic acid) .... Take 1 tablet by mouth once a day 4)  Caltrate 600+d 600-400 Mg-unit Tabs (Calcium carbonate-vitamin d) .... One tablet by mouth bid 5)  Claritin 10 Mg Caps (Loratadine) .... One tablet by mouth daily as needed for allergies 6)  Metamucil 0.52 Gm Caps (Psyllium) .... Start 2 caps by mouth once daily.  may increase up to 5 caps once daily as needed for regularity 7)  Pravastatin Sodium 80 Mg Tabs (Pravastatin sodium) .... One tab by mouth  once daily in the evening 8)  Plaquenil 200 Mg Tabs (Hydroxychloroquine sulfate) .... One tablet by mouth two times a day 9)  Prednisone 5 Mg Tabs (Prednisone) .... One tablet for 1 month, 2.5mg  for one month, then 2.5 mg every other day for 1 month then stop 10)  Sertraline Hcl 50 Mg Tabs (Sertraline hcl) .... 1/2 tablet by mouth once daily for 1 week, then increase to 1 tablet by mouth daily on the second week 11)  Alendronate Sodium 70 Mg Tabs (Alendronate sodium) .... One tablet by mouth once weekly on an empty stomach.  sit upright, no food or other medicines for 30 minutes after dose.  Patient Instructions: 1)  Please call if you develop BP greater tha 150/90 or less than 110/80. 2)  Check your blood pressure several times a week.   3)  Stop HCTZ. 4)  Sertraline- Please start 1/2 tablet once a day for one week, then increase to a full tablet. 5)  It will likely take several weeks before you will notice improvement. 6)  Side effects of this medicine may include drowsiness or nausea.  If this becomes an issue for you call for further instructions. 7)  Very rarely people may develop suicidal thoughts when taking these types of medicines- should this happen to you, discontinue medication and go directly to the emergency room. 8)  Please arrange a follow up appointment in 1 month  Prescriptions: ALENDRONATE SODIUM 70 MG TABS (ALENDRONATE SODIUM) one tablet by mouth once weekly on an empty stomach.  Sit upright, no food or other medicines for 30 minutes after dose.  #4 x 5   Entered and Authorized by:   Lemont Fillers FNP   Signed by:   Lemont Fillers FNP on 10/14/2010   Method used:   Electronically to        [facility] [phone]* (retail)       [address] [place]  [address]       Ph: [phone] or [phone]       Fax: [phone]   RxID:   [id number] SERTRALINE HCL 50 MG TABS (SERTRALINE HCL) 1/2 tablet by mouth once daily for 1 week, then increase  to 1 tablet by mouth daily on the second week  #30 x 1   Entered and Authorized by:   Lemont Fillers FNP   Signed by:   Lemont Fillers FNP on 10/14/2010   Method used:   [facility] to        [facility] [phone]* (retail)       [address]       [place], [place]  [address]       Ph: [phone] or [phone]  Fax: [phone]   RxID:   [id number]    Orders Added: 1)  TLB-B12 + Folate Pnl [[id number]/FOL] 2)  [facility] [[id number]] 3)  Est. Patient Level IV [[address]] 4)  Prescription Created Electronically [phone]    Current Allergies (reviewed today): No known allergies

## 2010-12-09 NOTE — Progress Notes (Signed)
Summary: FAXED DR Corliss Skains FOR RECORDS   Phone Note Outgoing Call   Call placed by: [facility] Call placed to: DR DEVESHWAR  Summary of Call: FAXED [facility] DEVESHWAS FOR RECORDS ON PATIENT  Initial call taken by: Roselle Locus,  December 17, 2009 3:24 PM

## 2010-12-09 NOTE — Assessment & Plan Note (Signed)
Summary: new to be est - jr   Vital Signs:  Patient profile:   70 year old female Height:      66.5 inches Weight:      142 pounds BMI:     22.66 O2 Sat:      100 % on Room air Temp:     98 degrees F oral Pulse rate:   57 / minute Pulse rhythm:   regular Resp:     18 per minute BP sitting:   152 / 90  (right arm) Cuff size:   regular  Vitals Entered By: Glendell Docker CMA (December 17, 2009 1:46 PM)  O2 Flow:  Room air  History of Present Illness: Ms Caprio is a 70 year old female who presents to establish care. Moved from [place] 6/10.    Preventative- notes that he last tetanus was approximately 2 years ago, not sure of date.  Up to date on pneumovax and flu shot. Exercises 3x a week- walking and aerobics.  Notes that her diet is healthy, "most of the time".  Up to date on mammogram, Pap.    RA- notes that she underwent 4 month treatment with remicaid- which she tells me was from remicaid. Now on methotrexate which she feels is helping her symptoms.   HTN- keeps a low sodium diet  Depression- notes that depression is well controlled at present, has been associated with situational stressors in the past.    Hyperlipidemia- trying to watch her cholesterol  Tobacco abuse- has tried "cold [place]", she does not wish to try chantix due to a friend who had a recent adverse effect to this med.  Wants to quit.    Preventive Screening-Counseling & Management  Alcohol-Tobacco     Alcohol drinks/day: 0     Smoking Status: current     Packs/Day: 0.5     Year Started: 190's  Caffeine-Diet-Exercise     Caffeine use/day: 4 cups coffee daily     Does Patient Exercise: yes     Times/week: 3  Allergies (verified): No Known Drug Allergies  Past History:  Past Medical History: Allergic rhinitis Anemia-iron deficiency Depression Hyperlipidemia Hypertension Urinary incontinence Rheumatoid arthritis  Past Surgical History: Hysterectomy  Family History: Family History of  Arthritis Family History of CAD Female 1st degree relative <50 Family History High cholesterol Family History Hypertension Family History of Stroke F 1st degree relative <60  Mom- deceased at 30, had colostomy due to severe GI bleed Dad-deceased at 88, HTN, ?cancer sister- CABG x 3 at age 41.  Brother- cirrhosis, ETOH abuse 2 additional sisters- one with arthritis (osteoarthritis) one sister with HTN  Daugher- colitis Son with HTN, kidney transplant- due to kidney problems after deset storm. 3 daughters with thyroid problems  Social History: Divorced 3 sons  5 daughters Retired from [employer] denies ETOH 1/2 PPD x 32 years Denies drug use.Smoking Status:  current Packs/Day:  0.5 Caffeine use/day:  4 cups coffee daily Does Patient Exercise:  yes  Review of Systems       Constitutional: Denies Fever ENT:  + nasal congestion x 2 weeks, taking otc sinus/allergy pills, clear nasal discharge. Resp: mild cough CV:  Denies Chest Pain, notes occasional chest tightness and fluttering in chest, these symptoms occur at rest and with exertion.   GI:  Denies nausea or vomitting GU: Denies dysuria Lymphatic: Denies lymphadenopathy Musculoskeletal:  Denies muscle, + joint pain in right shoulder and fingers Skin:  Denies Rashes Psychiatric: Denies current depression  Neuro: Denies numbness     Physical Exam  General:  Thin AA female, appears younger than stated age.   Awake, alert, NAD Head:  Normocephalic and atraumatic without obvious abnormalities. No apparent alopecia or balding. Eyes:  PERRLA Ears:  impacted cerumen right ear, cerumen removed with curette Mouth:  Oral mucosa and oropharynx without lesions or exudates.  Teeth in good repair. Neck:  No deformities, masses, or tenderness noted. Breasts:  No mass, nodules, thickening, tenderness, bulging, retraction, inflamation, nipple discharge or skin changes noted.   Lungs:  Normal respiratory effort, chest expands  symmetrically. Lungs are clear to auscultation, no crackles or wheezes. Heart:  Normal rate and regular rhythm. S1 and S2 normal without gallop, murmur, click, rub or other extra sounds. Abdomen:  Bowel sounds positive,abdomen soft and non-tender without masses, organomegaly or hernias noted. Msk:  normal ROM and no joint swelling.   Extremities:  No clubbing, cyanosis, edema, or deformity noted with normal full range of motion of all joints.   Neurologic:  alert & oriented X3, gait normal, and DTRs symmetrical and normal.   Skin:  Intact without suspicious lesions or rashes Cervical Nodes:  No lymphadenopathy noted Axillary Nodes:  No palpable lymphadenopathy Psych:  Cognition and judgment appear intact. Alert and cooperative with normal attention span and concentration. No apparent delusions, illusions, hallucinations   Impression & Recommendations:  Problem # 1:  HYPERTENSION (ICD-401.9) Assessment Deteriorated Took a benadryl/sinus prep last night which I suspect has some sudafed in it.  Patient instructed to avoid products with sudafed.  Took her BP med this AM.  Patient counselled on low sodium diet. Plan f/u BP check in 1 month. Her updated medication list for this problem includes:    Metoprolol Succinate 50 Mg Xr24h-tab (Metoprolol succinate) .Marland Kitchen... Take 1 tablet by mouth once a day  Problem # 2:  ARTHRITIS, RHEUMATOID (ICD-714.0) Assessment: Unchanged Patient is followed by Dr Corliss Skains.  Feels that her RA is well controlled with methotrexate.  She recently weaned herself off of prednisone. Will check CBC due to methotrexate use  Problem # 3:  OSTEOPOROSIS (ICD-733.00) Assessment: Comment Only + history prednisone use, she tells me that she weaned herself off of it.  Last dose over 1 week ago. Previously on 5 mg.  Tells me that she was previously treated with fosamax but she discontinued this med due to fear of femur fx per recent reports of adverse events.  I counseled patient  that she is likely at high risk of fracture given osteoporosis and benefits likely outweigh risks for her.  She will consider resuming this med.  In the meantime, she is agreeable to starting a calcium supplement.    Problem # 4:  VITAMIN D DEFICIENCY (ICD-268.9) Was previously on vitamin D 400  units daily, will change to caltrate + D two times a day, plan to check a vitamin D level as well  Problem # 5:  HYPERLIPIDEMIA (ICD-272.4) Assessment: Comment Only Will check FLP and LFT's, continue pravastatin. Her updated medication list for this problem includes:    Pravastatin Sodium 40 Mg Tabs (Pravastatin sodium) .Marland Kitchen... Take 1 tab by mouth at bedtime  Problem # 6:  TOBACCO ABUSE (ICD-305.1)  wants to quit smoking, declines patch or chantix, agreeablet to zyban.  Patient counselled on importance of smoking cessation.  Her updated medication list for this problem includes:    Zyban 150 Mg Xr12h-tab (Bupropion hcl (smoking deter)) ..... One tab by mouth daily x 3 days, then increase  to one tablet by mouth two times a day x 7-12 weeks  Orders: Tobacco use cessation intermediate 3-10 minutes (30160)  Problem # 7:  SINUSITIS (ICD-473.9)  I suspect mild sinusitis, will treat with amoxicillin Her updated medication list for this problem includes:    Amoxicillin 500 Mg Cap (Amoxicillin) .Marland Kitchen... Take 1 capsule by mouth three times a day x 10 days  Orders: Prescription Created Electronically [phone])  Problem # 8:  PALPITATIONS, OCCASIONAL (ICD-785.1) Assessment: Comment Only  Patient notes that she occasionally experiences palpitations and associated chest tightness.  + family history of CAD.  Will check TSH and will also order exercise stress test.  EKG today notes sinus bradycardia rate 53.   Her updated medication list for this problem includes:    Metoprolol Succinate 50 Mg Xr24h-tab (Metoprolol succinate) .Marland Kitchen... Take 1 tablet by mouth once a day  Orders: Misc. Referral (Misc.  Ref)  Complete Medication List: 1)  Metoprolol Succinate 50 Mg Xr24h-tab (Metoprolol succinate) .... Take 1 tablet by mouth once a day 2)  Methotrexate 2.5 Mg Tabs (Methotrexate sodium) .... 8 tablets by mouth once weekly 3)  Pravastatin Sodium 40 Mg Tabs (Pravastatin sodium) .... Take 1 tab by mouth at bedtime 4)  Folic Acid 1 Mg Tabs (Folic acid) .... Take 1 tablet by mouth once a day 5)  Caltrate 600+d 600-400 Mg-unit Tabs (Calcium carbonate-vitamin d) .... One tablet by mouth bid 6)  Zyban 150 Mg Xr12h-tab (Bupropion hcl (smoking deter)) .... One tab by mouth daily x 3 days, then increase to one tablet by mouth two times a day x 7-12 weeks 7)  Amoxicillin 500 Mg Cap (Amoxicillin) .... Take 1 capsule by mouth three times a day x 10 days  Other Orders: Mammogram (Screening) (Mammo) Cerumen Impaction Removal (35573)  Patient Instructions: 1)  Stop Smoking Tips: Choose a Quit date. Cut down before the Quit date. decide what you will do as a substitute when you feel the urge to smoke(gum,toothpick,exercise). 2)  Start Zyban (bupropion) to help you quit smoking. 3)  Please complete your screening mammogram in Early April 4)  Please return fasting for the following labs: 5)  Vitamin D 268.9 6)  CBC v58.83 7)  BMET 401.9 8)  FLP 272.4 9)  LFT's 272.4 10)  TSH 785.1 11)  Call if you develop fever over 101, increasing sinus pressure, pain with eye movement, increased facial tenderness of swelling, or if you develop visual changes. 12)  Please follow up in 1 month for a blood pressure check. Prescriptions: AMOXICILLIN 500 MG CAP (AMOXICILLIN) Take 1 capsule by mouth three times a day X 10 days  #30 x 0   Entered and Authorized by:   Lemont Fillers FNP   Signed by:   Lemont Fillers FNP on 12/17/2009   Method used:   Print then Give to Patient   RxID:   [id number] ZYBAN 150 MG XR12H-TAB (BUPROPION HCL (SMOKING DETER)) one tab by mouth daily x 3 days, then increase to one  tablet by mouth two times a day x 7-12 weeks  #60 x 2   Entered and Authorized by:   Lemont Fillers FNP   Signed by:   Lemont Fillers FNP on 12/17/2009   Method used:   Print then Give to Patient   RxID:   [id number]   Current Allergies (reviewed today): No known allergies    Preventive Care Screening  Last Flu Shot:    Date:  09/10/2009  Results:  given   Pap Smear:    Date:  07/01/2009    Results:  normal   Mammogram:    Date:  01/28/2009    Results:  normal   Last Pneumovax:    Date:  01/01/2009    Results:  given   Colonoscopy:    Date:  02/28/2008    Results:  Polyps Removed   Bone Density:    Date:  02/14/2008    Results:  Osteoporosis std dev      Preventive Care Screening  Last Flu Shot:    Date:  09/10/2009    Results:  given   Pap Smear:    Date:  07/01/2009    Results:  normal   Mammogram:    Date:  01/28/2009    Results:  normal   Last Pneumovax:    Date:  01/01/2009    Results:  given   Colonoscopy:    Date:  02/28/2008    Results:  Polyps Removed   Bone Density:    Date:  02/14/2008    Results:  Osteoporosis std dev

## 2010-12-09 NOTE — Assessment & Plan Note (Signed)
Summary: 1 MONTH FOLLOW UP/MHF   Vital Signs:  Patient profile:   70 year old female Height:      66.5 inches Weight:      142 pounds BMI:     22.66 Temp:     98.0 degrees F oral Pulse rate:   72 / minute Pulse rhythm:   regular Resp:     16 per minute BP sitting:   110 / 62  (right arm) Cuff size:   regular  Vitals Entered By: Mervin Kung CMA (January 15, 2010 2:47 PM) CC: room 5  One month follow up Comments Pt states she is staying constipated with no relief after making some diet changes.   Primary Care Provider:  Laury Axon  CC:  room 5  One month follow up.  History of Present Illness: Nancy [name] is a 70 year old female who presents today for follow up.  Since she last saw me she saw Dr. Laury Axon for HA/sinusitus and was treated with Levaquin and prednisone.  Tells me that her symptoms are much better, though does occasionally get some left ear pain.  Stress test was normal.  HCTZ was added for BP control.    Patient notes that her joint pain gets worse when she is constipation.  Also notes easy bruisibilty.  Allergies (verified): No Known Drug Allergies  Physical Exam  General:  Well-developed,well-nourished,in no acute distress; alert,appropriate and cooperative throughout examination Ears:  ?mild serous effusion left ear without erythema or bulging.   Neck:  No deformities, masses, or tenderness noted. Lungs:  Normal respiratory effort, chest expands symmetrically. Lungs are clear to auscultation, no crackles or wheezes. Heart:  Normal rate and regular rhythm. S1 and S2 normal without gallop, murmur, click, rub or other extra sounds.   Impression & Recommendations:  Problem # 1:  HEADACHE (ICD-784.0) Assessment Improved She continues to take metoprolol, symptoms improved.  ? mild effusion in left ear.  This may be causing some of her ear discomfort.  Theres is no erythema or bulging.  She does note that she experiences some seasonal allergies.  I recommended that she  add claritin 10mg  by mouth daily.  Her updated medication list for this problem includes:    Metoprolol Succinate 50 Mg Xr24h-tab (Metoprolol succinate) .Marland Kitchen... Take 1 tablet by mouth once a day  Problem # 2:  HYPERTENSION (ICD-401.9) Assessment: Improved BP improved since addition of HCTZ, check BMET, continue same. Her updated medication list for this problem includes:    Metoprolol Succinate 50 Mg Xr24h-tab (Metoprolol succinate) .Marland Kitchen... Take 1 tablet by mouth once a day    Hydrochlorothiazide 25 Mg Tabs (Hydrochlorothiazide) ..... One tablet by mouth daily  BP today: 110/62 Prior BP: 144/74 (01/01/2010)  Prior 10 Yr Risk Heart Disease: > 32 % (01/01/2010)  Labs Reviewed: K+: 4.5 (12/18/2009) Creat: : 0.81 (12/18/2009)   Chol: 251 (12/18/2009)   HDL: 42 (12/18/2009)   LDL: 174 (12/18/2009)   TG: 174 (12/18/2009)  Orders: T-Basic Metabolic Panel [phone])  Complete Medication List: 1)  Metoprolol Succinate 50 Mg Xr24h-tab (Metoprolol succinate) .... Take 1 tablet by mouth once a day 2)  Methotrexate 2.5 Mg Tabs (Methotrexate sodium) .... 8 tablets by mouth once weekly 3)  Folic Acid 1 Mg Tabs (Folic acid) .... Take 1 tablet by mouth once a day 4)  Caltrate 600+d 600-400 Mg-unit Tabs (Calcium carbonate-vitamin d) .... One tablet by mouth bid 5)  Zyban 150 Mg Xr12h-tab (Bupropion hcl (smoking deter)) .... One tablet by mouth  two times a day x 7-12 weeks 6)  Pravastatin Sodium 40 Mg Tabs (Pravastatin sodium) .... One tab by mouth daily at bedtime 7)  Hydrochlorothiazide 25 Mg Tabs (Hydrochlorothiazide) .... One tablet by mouth daily 8)  Claritin 10 Mg Caps (Loratadine) .... One tablet by mouth daily as needed for allergies  Other Orders: T-CBC w/Diff ([id number]) INR/PT-[facility] ([address]) T-PTT ([id number])  Patient Instructions: 1)  Please follow up in 3 months- sooner if problems or concerns.  Current Allergies (reviewed today): No known allergies

## 2010-12-09 NOTE — Letter (Signed)
   [place] at [facility] [address] [place], [place]  [address]  [place] Phone: [phone]      August 19, 2010   Nancy [name] [address] [place], [place] [address]  RE:  LAB RESULTS  Dear  Ms. OSORIA,  The following is an interpretation of your most recent lab tests.  Please take note of any instructions provided or changes to medications that have resulted from your lab work.  ELECTROLYTES:  Good - no changes needed  KIDNEY FUNCTION TESTS:  Good - no changes needed  LIPID PANEL:  Stable - no changes needed Triglyceride: 170   Cholesterol: 205   LDL: 124   HDL: 47   Chol/HDL%:  4.4 Ratio Your cholesterol is improving.  Continue the pravastatin and continue to work hard on a low fat/low cholesterol diet.  Please see attached information about dietary changes.  Also, your triglycerides are high.  Try to avoid concentrated sweets.  Subsitute whole grained versions of rice, pasta, bread and cut back on your portions of these foods.    Sincerely Yours,    Lemont Fillers FNP  Appended Document:  Mailed.

## 2010-12-09 NOTE — Letter (Signed)
   [place] at [facility] [address] [place], [place]  [address]  [place] Phone: [phone]      January 16, 2010   KAMYIA THOMASON [address] [place], [place] [address]  RE:  LAB RESULTS  Dear  Ms. DIEMER,  The following is an interpretation of your most recent lab tests.  Please take note of any instructions provided or changes to medications that have resulted from your lab work.  ELECTROLYTES:  Good - no changes needed  KIDNEY FUNCTION TESTS:  Good - no changes needed   DIABETIC STUDIES:  Excellent - no changes needed Blood Glucose: 97    CBC:  Good - no changes needed Your blood clotting studies were also normal.   Sincerely Yours,    Lemont Fillers FNP

## 2010-12-09 NOTE — Miscellaneous (Signed)
Summary: flu vaccine  Clinical Lists Changes  Orders: Added new Service order of Flu Vaccine 78yrs + MEDICARE PATIENTS (A6301) - Signed Added new Service order of Administration Flu vaccine - MCR (S0109) - Signed Observations: Added new observation of FLU VAX VIS: 06/03/2010 version (08/18/2010 10:59) Added new observation of FLU VAXLOT: [id number] (08/18/2010 10:59) Added new observation of FLU VAXMFR: Glaxosmithkline (08/18/2010 10:59) Added new observation of FLU VAX EXP: 05/09/2011 (08/18/2010 10:59) Added new observation of FLU VAX DSE: 0.21ml (08/18/2010 10:59) Added new observation of FLU VAX: Fluvax 3+ (08/18/2010 10:59)     Flu Vaccine Consent Questions     Do you have a history of severe allergic reactions to this vaccine? no    Any prior history of allergic reactions to egg and/or gelatin? no    Do you have a sensitivity to the preservative Thimersol? no    Do you have a past history of Guillan-Barre Syndrome? no    Do you currently have an acute febrile illness? no    Have you ever had a severe reaction to latex? no    Vaccine information given and explained to patient? yes    Are you currently pregnant? no    Lot Number:[id number]   Exp Date:05/09/2011   Site Given  Left Deltoid IM.  Nicki Guadalajara Fergerson CMA (AAMA)  August 18, 2010 11:00 AM

## 2010-12-09 NOTE — Miscellaneous (Signed)
  Clinical Lists Changes  Medications: Added new medication of PLAQUENIL 200 MG TABS (HYDROXYCHLOROQUINE SULFATE) one tablet by mouth two times a day Added new medication of PREDNISONE 5 MG TABS (PREDNISONE) one tablet for 1 month, 2.5mg  for one month, then 2.5 mg every other day for 1 month then stop

## 2010-12-09 NOTE — Letter (Signed)
Summary: [facility]  [facility]   Imported By: Lanelle Bal 01/21/2010 10:23:01  _____________________________________________________________________  External Attachment:    Type:   Image     Comment:   External Document

## 2010-12-09 NOTE — Progress Notes (Signed)
Summary: needs bone density  Phone Note Outgoing Call   Summary of Call: Could you please call patient and arrange a dexascan?  I had asked her to schedule last visit.  Initial call taken by: Lemont Fillers FNP,  September 08, 2010 3:52 PM  Follow-up for Phone Call        Advised pt. Pt was transferred to Marj to schedule appt. Nicki Guadalajara Fergerson CMA Duncan Dull)  September 09, 2010 10:35 AM   Additional Follow-up for Phone Call Additional follow up Details #1::        PATIENT SCHEDULED  Nancy [name]  September 09, 2010 11:10 AM

## 2010-12-09 NOTE — Miscellaneous (Signed)
Summary: BONE DENSITY  Clinical Lists Changes  Orders: Added new Test order of T-Bone Densitometry (77080) - Signed Added new Test order of T-Lumbar Vertebral Assessment (77082) - Signed 

## 2010-12-09 NOTE — Letter (Signed)
Summary: Records Dated 08-08-09 thru 11-12-09/[facility]   Records Dated 08-08-09 thru 11-12-09/[facility]   Imported By: Lanelle Bal 12/30/2009 11:59:33  _____________________________________________________________________  External Attachment:    Type:   Image     Comment:   External Document

## 2010-12-09 NOTE — Progress Notes (Signed)
  Phone Note Outgoing Call   Action Taken: Assistance medications ready for pick up Summary of Call: Called patient and left message for her to return my call to discuss bone density test. Initial call taken by: Lemont Fillers FNP,  September 29, 2010 10:17 AM  Follow-up for Phone Call        Called patient, reviewed bone density findings.  She will consider fosamax.  Patient reports that she had a syncopal event 1 month ago at church and was admitted at [facility].  They adjusted her BP meds.  Recommended that she be seen in office for f/u.  She will come tomorrow at 8AM.

## 2010-12-09 NOTE — Progress Notes (Signed)
Summary: Bad Headache again   Phone Note Call from Patient   Caller: Daughter Summary of Call: Grandaughter- Tamika called & states that pt. was seen here on 2/8 and was prescribed antibiotics and lastnight she has started having bad headaches again and needs to know if she can have something for the headaches? Call pt. back at [phone] Initial call taken by: Michaelle Copas,  December 26, 2009 1:58 PM  Follow-up for Phone Call        Patient states she started having HA again yesterday.  No nasal congestion.  +sinus pain, pressure.  afebrile.   BP ok 140's/70-80.  Took 800mg  ibuprofen, no relief.  Patiet has 3 days left of amoxil for sinusitis. [facility]  December 26, 2009 3:27 PM  discussed with Efraim Kaufmann - needs ov, ov scheduled for tomorrow afternoon.  Advised ED if severe HA Shary Decamp  December 26, 2009 4:25 PM

## 2010-12-09 NOTE — Progress Notes (Signed)
Summary: records rec   Phone Note Other Incoming   Caller: smoc  Summary of Call: records rec from dr devashwar.  coded and on your desk  Initial call taken by: Roselle Locus,  December 23, 2009 9:49 AM

## 2010-12-09 NOTE — Progress Notes (Signed)
Summary: Hydrochlorothiazide Refill request  Phone Note Refill Request Message from:  Fax from Pharmacy on February 20, 2010 9:33 AM  Refills Requested: Medication #1:  HYDROCHLOROTHIAZIDE 25 MG TABS one tablet by mouth daily   Supply Requested: 1 month   Last Ref[facility]: [facility]2010 [facility] [address] [place] [phone] (fax)   Method Requested: Electronic Next Appointment Scheduled: 6.6.11 8:15AM Initial call taken by: Lannette Donath,  February 20, 2010 9:35 AM    Prescriptions: HYDROCHLOROTHIAZIDE 25 MG TABS (HYDROCHLOROTHIAZIDE) one tablet by mouth daily  #30 x 1   Entered by:   Mervin Kung CMA   Authorized by:   Lemont Fillers FNP   Signed by:   Mervin Kung CMA on 02/20/2010   Method used:   [facility]ron[facility] to        [facility] [phone]* (retail)       [address]       [place], [place]  [address]       Ph: [phone] or [phone]       Fax: [phone]   RxID:   [id number]

## 2010-12-09 NOTE — Letter (Signed)
Summary: [facility]  [facility]   Imported By: Lanelle Bal 09/20/2010 09:07:40  _____________________________________________________________________  External Attachment:    Type:   Image     Comment:   External Document

## 2010-12-09 NOTE — Assessment & Plan Note (Signed)
Summary: 3 month f/u / tf,cma-Rm 4   Vital Signs:  Patient profile:   70 year old female Height:      66.5 inches Weight:      142.75 pounds BMI:     22.78 Temp:     97.7 degrees F oral Pulse rate:   66 / minute Pulse rhythm:   regular Resp:     16 per minute BP sitting:   160 / 82  (right arm) Cuff size:   regular  Vitals Entered By: Mervin Kung CMA Duncan Dull) (July 21, 2010 8:12 AM)  Primary Care Provider:  Lemont Fillers FNP  CC:  Rm 4  3 month f/u. Needs refills on Metoprolol & Pravachol. Also wants to know if she should continue HCTZ; has not had it refilled. and Hypertension Management.  History of Present Illness: Nancy [name] is a 70 year old female who presents today for follow up of her hypertension.  She ran out of her HCTZ and has not had it for some time.  Notes + compliance with beta blocker.  Pt notes that she has had a lot of stress this summer due to her children being ill.    Hypertension History:      She denies headache, chest pain, palpitations, dyspnea with exertion, orthopnea, and peripheral edema.        Positive major cardiovascular risk factors include female age 70 years old or older, hyperlipidemia, hypertension, and current tobacco user.     Allergies (verified): No Known Drug Allergies  Past History:  Past Medical History: Last updated: 12/17/2009 Allergic rhinitis Anemia-iron deficiency Depression Hyperlipidemia Hypertension Urinary incontinence Rheumatoid arthritis  Past Surgical History: Last updated: 12/17/2009 Hysterectomy  Physical Exam  General:  Well-developed,well-nourished,in no acute distress; alert,appropriate and cooperative throughout examination Lungs:  Normal respiratory effort, chest expands symmetrically. Lungs are clear to auscultation, no crackles or wheezes. Heart:  Normal rate and regular rhythm. S1 and S2 normal without gallop, murmur, click, rub or other extra sounds. Extremities:  No clubbing,  cyanosis, edema, or deformity noted with normal full range of motion of all joints.   Psych:  Cognition and judgment appear intact. Alert and cooperative with normal attention span and concentration. No apparent delusions, illusions, hallucinations   Impression & Recommendations:  Problem # 1:  HYPERTENSION (ICD-401.9) Assessment Deteriorated  Plan to resume HCTZ and have patient f/u in 1 month Her updated medication list for this problem includes:    Metoprolol Succinate 50 Mg Xr24h-tab (Metoprolol succinate) .Marland Kitchen... Take 1 tablet by mouth once a day    Hydrochlorothiazide 25 Mg Tabs (Hydrochlorothiazide) ..... One tablet by mouth daily  Orders: Prescription Created Electronically [phone])  Problem # 2:  HYPERLIPIDEMIA (ICD-272.4) Assessment: Comment Only Pravachol was increased last visit.  Will repeat FLP and check LFT's Her updated medication list for this problem includes:    Pravachol 80 Mg Tabs (Pravastatin sodium) ..... One tablet by mouth daily  Orders: TLB-Hepatic/Liver Function Pnl (80076-HEPATIC) TLB-Lipid Panel (80061-LIPID)  Complete Medication List: 1)  Metoprolol Succinate 50 Mg Xr24h-tab (Metoprolol succinate) .... Take 1 tablet by mouth once a day 2)  Methotrexate 2.5 Mg Tabs (Methotrexate sodium) .... 8 tablets by mouth once weekly 3)  Folic Acid 1 Mg Tabs (Folic acid) .... Take 1 tablet by mouth once a day 4)  Caltrate 600+d 600-400 Mg-unit Tabs (Calcium carbonate-vitamin d) .... One tablet by mouth bid 5)  Pravachol 80 Mg Tabs (Pravastatin sodium) .... One tablet by mouth daily 6)  Hydrochlorothiazide  25 Mg Tabs (Hydrochlorothiazide) .... One tablet by mouth daily 7)  Claritin 10 Mg Caps (Loratadine) .... One tablet by mouth daily as needed for allergies  Hypertension Assessment/Plan:      The patient's hypertensive risk group is category B: At least one risk factor (excluding diabetes) with no target organ damage.  Her calculated 10 year risk of coronary heart  disease is 27 %.  Today's blood pressure is 160/82.  Her blood pressure goal is < 140/90.  Patient Instructions: 1)  Please schedule a follow-up appointment in 1 month. 2)  Complete your  blood work downstairs today. Prescriptions: HYDROCHLOROTHIAZIDE 25 MG TABS (HYDROCHLOROTHIAZIDE) one tablet by mouth daily  #30 x 3   Entered and Authorized by:   Lemont Fillers FNP   Signed by:   Lemont Fillers FNP on 07/21/2010   Method used:   [facility] [phone]* (retail)       [address] [place]  [address]       Ph: [phone] or [phone]       Fax: [phone]   RxID:   [id number] PRAVACHOL 80 MG TABS (PRAVASTATIN SODIUM) one tablet by mouth daily  #30 Tablet x 3   Entered and Authorized by:   Lemont Fillers FNP   Signed by:   Lemont Fillers FNP on 07/21/2010   Method used:   [facility]        [facility] [phone]* (retail)       [address] [place]  [address]       Ph: [phone] or [phone]       Fax: [phone]   RxID:   [id number] METOPROLOL SUCCINATE 50 MG XR24H-TAB (METOPROLOL SUCCINATE) Take 1 tablet by mouth once a day  #30 x 3   Entered and Authorized by:   Lemont Fillers FNP   Signed by:   Lemont Fillers FNP on 07/21/2010   Method used:   [facility]        [facility] [phone]* (retail)       [address]       [place], [place]  [address]       Ph: [phone] or [phone]       Fax: [phone]   RxID:   [id number]   CC: Rm 4  3 month f/u. Needs refills on Metoprolol & Pravachol. Also wants to know if she should continue HCTZ; has not had it refilled., Hypertension Management Is Patient Diabetic? No   Current Allergies (reviewed today): No known allergies

## 2010-12-09 NOTE — Progress Notes (Signed)
Summary: pravastatin  Phone Note Outgoing Call   Summary of Call: Please call patient and let her know that her cholesterol is not at goal.  I would like for her to start a different medication for her cholesterol.  Stop pravastatin, start crestor.  We will plan to repeat her cholesterol and LFT's in 3 months. Initial call taken by: Lemont Fillers FNP,  July 22, 2010 9:20 AM  Follow-up for Phone Call        Notified pt per [facility] instructions. She stated that she had not been taking Pravastatin regularly the last few months. She wants to take it regularly and then have [place] recheck it before she starts a different med. Please advise. Nicki Guadalajara Fergerson CMA Duncan Dull)  July 23, 2010 9:20 AM   Additional Follow-up for Phone Call Additional follow up Details #1::        That should be fine, pls cancel rx for crestor. Additional Follow-up by: Lemont Fillers FNP,  July 23, 2010 9:21 AM    Additional Follow-up for Phone Call Additional follow up Details #2::    [facility]. Mervin Kung CMA (AAMA)  July 23, 2010 9:24 AM   New/Updated Medications: CRESTOR 10 MG TABS (ROSUVASTATIN CALCIUM) one tablet by mouth daily Prescriptions: CRESTOR 10 MG TABS (ROSUVASTATIN CALCIUM) one tablet by mouth daily  #30 x 3   Entered and Authorized by:   Lemont Fillers FNP   Signed by:   Lemont Fillers FNP on 07/22/2010   Method used:   [facility]        [facility] [phone]* (retail)       [address]       [place], [place]  [address]       Ph: [phone] or [phone]       Fax: [phone]   RxID:   [id number]

## 2010-12-09 NOTE — Progress Notes (Signed)
Summary: duplicate refill  Phone Note Refill Request Message from:  Fax from Pharmacy on February 21, 2010 8:49 AM  Refills Requested: Medication #1:  HYDROCHLOROTHIAZIDE 25 MG TABS one tablet by mouth daily   Dosage confirmed as above?Dosage Confirmed   Supply Requested: 1 month   [facility] [address] [place] [phone] (fax)  Next Appointment Scheduled: 6.6.11 8:15AM Initial call taken by: Lannette Donath,  February 21, 2010 8:51 AM  Follow-up for Phone Call        Spoke to pharmacist and verified that electronic authorization was received yesterday.  They will remove request from their fax system. Mervin Kung CMA  February 21, 2010 2:25 PM

## 2010-12-11 NOTE — Letter (Signed)
   [place] at [facility] [address] [place], [place]  [address]  [place] Phone: [phone]      November 30, 2010   Nancy [name] [address] [place], [place] [address]  RE:  LAB RESULTS  Dear  [name],  The following is an interpretation of your most recent lab tests.  Please take note of any instructions provided or changes to medications that have resulted from your lab work.  Hemoccult Test for blood in stool:  negative  The stool sample was negative for blood.     Sincerely Yours,    Lemont Fillers FNP  Appended Document:  Mailed.

## 2010-12-11 NOTE — Progress Notes (Signed)
Summary: lab results  Phone Note Outgoing Call   Summary of Call: Please call patient and let her know that her b12 levels are a little bit low.  I would like for her to add an otc b12 supplement as below.  She should keep upcoming apt in march. Initial call taken by: Lemont Fillers FNP,  November 19, 2010 11:31 AM  Follow-up for Phone Call        Left detailed message on pt's voice mail re: Bridgitte Felicetti's instructions and to call if any questions. Nicki Guadalajara Fergerson CMA (AAMA)  November 19, 2010 1:34 PM   New Problems: ANEMIA, B12 DEFICIENCY (ICD-281.1)   New Problems: ANEMIA, B12 DEFICIENCY (ICD-281.1) New/Updated Medications: VITAMIN B-12 100 MCG TABS (CYANOCOBALAMIN) one tablet by mouth daily

## 2010-12-11 NOTE — Assessment & Plan Note (Signed)
Summary: 1 MONTH FOLLOW UP/MHF--rm 5   Vital Signs:  Patient profile:   70 year old female Height:      66.5 inches Weight:      144.25 pounds BMI:     23.02 Temp:     97.7 degrees F oral Pulse rate:   60 / minute Pulse rhythm:   regular Resp:     16 per minute BP sitting:   124 / 68  (right arm) Cuff size:   regular  Vitals Entered By: Mervin Kung CMA Duncan Dull) (November 12, 2010 8:03 AM) CC: Pt here for 1 month f/u.  Has had abdominal pain and diarrhea last Saturday. Diarrhea resolved but still has abdominal pain after eating. Is Patient Diabetic? No Pain Assessment Patient in pain? no      Comments Pt has completed Prednisone. Rheumatologist has prescribed a new med that she has not filled yet; trying to decide if she wants to start it.  Nicki Guadalajara Fergerson CMA (AAMA)  November 12, 2010 8:12 AM    Primary Care Provider:  Lemont Fillers FNP  CC:  Pt here for 1 month f/u.  Has had abdominal pain and diarrhea last Saturday. Diarrhea resolved but still has abdominal pain after eating.Marland Kitchen  History of Present Illness: Ms.  Salvas is a 70 year old female who presents today for follow up.  1)  Anxiety- Last visit she was started on Sertraline.  She reports that she is tolerating medication without adverse side effects.  Reports that her nerves are "calm".  No longer feeling depressed.  Sleeping better.  Memory is some better.    2) Osteoporosis-  using generic fosamax.    3) HTN-  less light headed,  stopped HCTZ,  continuing metoprolol.  4) Diarrhea-  Friday evening had abdominal cramping, gas, diarrhea.  Took immodium.  Stools now more formed, but still having some nausea.   Allergies (verified): No Known Drug Allergies  Past History:  Past Medical History: Last updated: 12/17/2009 Allergic rhinitis Anemia-iron deficiency Depression Hyperlipidemia Hypertension Urinary incontinence Rheumatoid arthritis  Past Surgical History: Last updated:  12/17/2009 Hysterectomy  Review of Systems       see HPI  Physical Exam  General:  Well-developed,well-nourished,in no acute distress; alert,appropriate and cooperative throughout examination Head:  Normocephalic and atraumatic without obvious abnormalities. No apparent alopecia or balding. Lungs:  Normal respiratory effort, chest expands symmetrically. Lungs are clear to auscultation, no crackles or wheezes. Heart:  Normal rate and regular rhythm. S1 and S2 normal without gallop, murmur, click, rub or other extra sounds. Abdomen:  mild generalized abdominal tenderness without guarding.  + BS, soft, non-distended.   Impression & Recommendations:  Problem # 1:  ANXIETY (ICD-300.00) Assessment Improved  Improved, continue sertraline.  Her updated medication list for this problem includes:    Sertraline Hcl 50 Mg Tabs (Sertraline hcl) ..... One tablet by mouth once daily  Orders: Prescription Created Electronically [phone])  Problem # 2:  GASTROENTERITIS, ACUTE (ICD-558.9) Assessment: Improved clinically improving.  Will check CBC. Orders: TLB-CBC Platelet - w/Differential (85025-CBCD)  Problem # 3:  HYPERTENSION (ICD-401.9) Assessment: Improved BP is stable on current dose of metoprolol and off of HCTZ. Continue same.   Her updated medication list for this problem includes:    Metoprolol Succinate 50 Mg Xr24h-tab (Metoprolol succinate) .Marland Kitchen... Take 1 tablet by mouth once a day  BP today: 124/68 Prior BP: 110/70 (10/14/2010)  Prior 10 Yr Risk Heart Disease: 13 % (08/18/2010)  Labs Reviewed: K+:  4.6 (08/18/2010) Creat: : 0.84 (08/18/2010)   Chol: 205 (08/18/2010)   HDL: 47 (08/18/2010)   LDL: 124 (08/18/2010)   TG: 170 (08/18/2010)  Complete Medication List: 1)  Metoprolol Succinate 50 Mg Xr24h-tab (Metoprolol succinate) .... Take 1 tablet by mouth once a day 2)  Methotrexate 2.5 Mg Tabs (Methotrexate sodium) .... 8 tablets by mouth once weekly 3)  Folic Acid 1 Mg Tabs  (Folic acid) .... Take 1 tablet by mouth once a day 4)  Caltrate 600+d 600-400 Mg-unit Tabs (Calcium carbonate-vitamin d) .... One tablet by mouth bid 5)  Claritin 10 Mg Caps (Loratadine) .... One tablet by mouth daily as needed for allergies 6)  Metamucil 0.52 Gm Caps (Psyllium) .... Start 2 caps by mouth once daily.  may increase up to 5 caps once daily as needed for regularity 7)  Pravastatin Sodium 80 Mg Tabs (Pravastatin sodium) .... One tab by mouth once daily in the evening 8)  Plaquenil 200 Mg Tabs (Hydroxychloroquine sulfate) .... One tablet by mouth two times a day 9)  Sertraline Hcl 50 Mg Tabs (Sertraline hcl) .... One tablet by mouth once daily 10)  Alendronate Sodium 70 Mg Tabs (Alendronate sodium) .... One tablet by mouth once weekly on an empty stomach.  sit upright, no food or other medicines for 30 minutes after dose. 11)  Vitamin B-12 250 Mcg Tabs (Cyanocobalamin) .... One tablet by mouth once daily 12)  Ibuprofen 800 Mg Tabs (Ibuprofen) .... Take 1 tablet every 8 hours as needed.  Patient Instructions: 1)  Please call if your abdominal discomfort worsens or is not resolved by the weekend, if recurrent diarrhea, or if vomitting. 2)  Follow up in 3 months.   Prescriptions: SERTRALINE HCL 50 MG TABS (SERTRALINE HCL) one tablet by mouth once daily  #30 x 2   Entered and Authorized by:   Lemont Fillers FNP   Signed by:   Lemont Fillers FNP on 11/12/2010   Method used:   [facility]        [facility] [phone]* (retail)       [address] [place]  [address]       Ph: [phone] or [phone]       Fax: [phone]   RxID:   [id number]    Orders Added: 1)  TLB-CBC Platelet - w/Differential [85025-CBCD] 2)  Est. Patient Level III [36644] 3)  Prescription Created Electronically [phone]    Current Allergies (reviewed today): No known allergies

## 2010-12-11 NOTE — Letter (Signed)
Summary: MMSE Form  MMSE Form   Imported By: Lanelle Bal 10/25/2010 11:25:23  _____________________________________________________________________  External Attachment:    Type:   Image     Comment:   External Document

## 2010-12-11 NOTE — Letter (Signed)
Summary: [facility]: Immunoassay Fecal Occult Blood (iFOB) Order Psychologist, counselling at [facility]  [address]   [place], [place] [address]   Phone: [phone]  Fax: [phone]      [facility]: Immunoassay Fecal Occult Blood (iFOB) Order Form   November 14, 2010 MRN: [id number]   Nancy [name] 1941-03-26   Physicican Name:_____[name], NP  Diagnosis Code:______285.9____________________      Mervin Kung CMA (AAMA)

## 2010-12-11 NOTE — Progress Notes (Signed)
Summary: lab result, additional testing needed.  ---- Converted from flag ---- ---- 11/14/2010 10:21 AM, Lemont Fillers FNP wrote: Yes, please let her know that she is mildly anemic.  ---- 11/14/2010 10:03 AM, Mervin Kung CMA (AAMA) wrote: Tests cannot be added as serum is needed to run these tests and only a lavender tube was drawn previously. Is it ok to call pt to return for additional testing?  ---- 11/14/2010 9:55 AM, Lemont Fillers FNP wrote: Please see if the following labs can be added:  TIBC, ferritin, serum iron, b12/folate, IFOB  Diagnosis Anemia NOS ------------------------------  Phone Note Call from Patient   Summary of Call: Notified pt she is mildly anemic and will need to return to the lab for additional work up of this per Sandford Craze, NP. Pt voices understanding and states she will return to the lab this afternoon. States her abdominal pain has improved. Order entered and faxed to the lab. Pt states she will see [place] at 1:30pm to pick up IFOB kit.  Nicki Guadalajara Fergerson CMA Duncan Dull)  November 14, 2010 10:40 AM   Follow-up for Phone Call        Pt picked up IFOB on Friday. Process explained to pt and she voices understanding. Nicki Guadalajara Fergerson CMA (AAMA)  November 17, 2010 10:45 AM

## 2010-12-15 ENCOUNTER — Ambulatory Visit (INDEPENDENT_AMBULATORY_CARE_PROVIDER_SITE_OTHER): Payer: Medicare Other | Admitting: Family

## 2010-12-15 ENCOUNTER — Telehealth: Payer: Self-pay | Admitting: Family

## 2010-12-15 ENCOUNTER — Encounter: Payer: Self-pay | Admitting: Family

## 2010-12-15 DIAGNOSIS — R350 Frequency of micturition: Secondary | ICD-10-CM

## 2010-12-15 DIAGNOSIS — M069 Rheumatoid arthritis, unspecified: Secondary | ICD-10-CM

## 2010-12-15 DIAGNOSIS — J329 Chronic sinusitis, unspecified: Secondary | ICD-10-CM

## 2010-12-15 LAB — CONVERTED CEMR LAB
Bilirubin Urine: NEGATIVE
Glucose, Urine, Semiquant: NEGATIVE
Ketones, urine, test strip: NEGATIVE
Nitrite: NEGATIVE
Protein, U semiquant: NEGATIVE
Specific Gravity, Urine: <1.005
Urobilinogen, UA: 0.2
WBC Urine, dipstick: NEGATIVE
pH: 8

## 2010-12-25 NOTE — Assessment & Plan Note (Signed)
Summary: congestion/ss--rm 5   Vital Signs:  Patient profile:   70 year old female Height:      66.5 inches Weight:      144.50 pounds BMI:     23.06 Temp:     98.3 degrees F oral Pulse rate:   72 / minute Pulse rhythm:   regular Resp:     18 per minute BP sitting:   140 / 84  (right arm) Cuff size:   regular  Vitals Entered By: Mervin Kung CMA Duncan Dull) (December 15, 2010 1:21 PM) CC: Pt states she has had headaches, sneezing, productive cough and pain in both arms and hands since Friday. Pt states she has had urinary frequency and pressure x 1 week. Is Patient Diabetic? No Pain Assessment Patient in pain? no      Comments Pt not taking claritin. Nicki Guadalajara Fergerson CMA Duncan Dull)  December 15, 2010 1:27 PM    Primary Care Provider:  Lemont Fillers FNP  CC:  Pt states she has had headaches, sneezing, and productive cough and pain in both arms and hands since Friday. Pt states she has had urinary frequency and pressure x 1 week.Marland Kitchen  History of Present Illness: Ms.  Eble is a 70 year old female who presents today with multiple complaints.    1) HA- notes frontal HA, nose burning, chest congestion.  Clear phlegm.  Symptoms started Friday (4 days ago)  Denies associated fever.  Also having frequency and urgency of urination.    2) RA- was taken off of her prednisone. Was given rx for hydroxychloroquine.  She did not start this medication due to concerns about side effects. She is now complaining of pain and swelling in both hands and the right shoulder pain.    Allergies (verified): No Known Drug Allergies  Past History:  Past Medical History: Last updated: 12/17/2009 Allergic rhinitis Anemia-iron deficiency Depression Hyperlipidemia Hypertension Urinary incontinence Rheumatoid arthritis  Past Surgical History: Last updated: 12/17/2009 Hysterectomy  Review of Systems       see HPI  Physical Exam  General:  Awake, alert, in NAD.  Looks tired. Eyes:  PERRLA,  sclera clear Ears:  External ear exam shows no significant lesions or deformities.  Otoscopic examination reveals clear canals, tympanic membranes are intact bilaterally without bulging, retraction, inflammation or discharge. Hearing is grossly normal bilaterally. Mouth:  Oral mucosa and oropharynx without lesions or exudates.  Teeth in good repair. Neck:  No deformities, masses, or tenderness noted. Lungs:  Normal respiratory effort, chest expands symmetrically. Lungs are clear to auscultation, no crackles or wheezes. Heart:  Normal rate and regular rhythm. S1 and S2 normal without gallop, murmur, click, rub or other extra sounds. Extremities:  bilateral hands noted to have swelling at PIP joints.  + tenderness to palpation.     Impression & Recommendations:  Problem # 1:  SINUSITIS (ICD-473.9) Assessment New  Will plan treatement with ceftin Her updated medication list for this problem includes:    Ceftin 500 Mg Tabs (Cefuroxime axetil) ..... One tablet by mouth two times a day for 10 days  Orders: Prescription Created Electronically [phone])  Problem # 2:  ARTHRITIS, RHEUMATOID (ICD-714.0) Assessment: Deteriorated Called Dr. Corliss Skains, case was reviewed with her.  She recommended prednisone taper as ordered and that patient start plaquenil.    Complete Medication List: 1)  Metoprolol Succinate 50 Mg Xr24h-tab (Metoprolol succinate) .... Take 1 tablet by mouth once a day 2)  Methotrexate 2.5 Mg Tabs (Methotrexate sodium) .Marland KitchenMarland KitchenMarland Kitchen  8 tablets by mouth once weekly 3)  Folic Acid 1 Mg Tabs (Folic acid) .... Take 1 tablet by mouth once a day 4)  Caltrate 600+d 600-400 Mg-unit Tabs (Calcium carbonate-vitamin d) .... One tablet by mouth bid 5)  Claritin 10 Mg Caps (Loratadine) .... One tablet by mouth daily as needed for allergies 6)  Metamucil 0.52 Gm Caps (Psyllium) .... Start 2 caps by mouth once daily.  may increase up to 5 caps once daily as needed for regularity 7)  Pravastatin Sodium 80  Mg Tabs (Pravastatin sodium) .... One tab by mouth once daily in the evening 8)  Plaquenil 200 Mg Tabs (Hydroxychloroquine sulfate) .... One tablet by mouth two times a day 9)  Sertraline Hcl 50 Mg Tabs (Sertraline hcl) .... One tablet by mouth once daily 10)  Alendronate Sodium 70 Mg Tabs (Alendronate sodium) .... One tablet by mouth once weekly on an empty stomach.  sit upright, no food or other medicines for 30 minutes after dose. 11)  Vitamin B-12 250 Mcg Tabs (Cyanocobalamin) .... One tablet by mouth once daily 12)  Ibuprofen 800 Mg Tabs (Ibuprofen) .... Take 1 tablet every 8 hours as needed. 13)  Vitamin B-12 100 Mcg Tabs (Cyanocobalamin) .... One tablet by mouth daily 14)  Ceftin 500 Mg Tabs (Cefuroxime axetil) .... One tablet by mouth two times a day for 10 days 15)  Prednisone 5 Mg Tabs (Prednisone) .... 4 tabs by mouth daily x 4 days, then 3 tabs daily for 4 days, then 2 tabs daily for 7 days, then 1.5 tabs for 1 week, then 1 tab for one week, then 1/2 tab for 1 week  Other Orders: UA Dipstick w/o Micro (manual) ([id number])  Patient Instructions: 1)  Call if you develop fever over 101, increasing sinus pressure, pain with eye movement, increased facial tenderness of swelling, or if you develop visual changes. Prescriptions: PREDNISONE 5 MG TABS (PREDNISONE) 4 tabs by mouth daily x 4 days, then 3 tabs daily for 4 days, then 2 tabs daily for 7 days, then 1.5 tabs for 1 week, then 1 tab for one week, then 1/2 tab for 1 week  #64 x 0   Entered and Authorized by:   Lemont Fillers FNP   Signed by:   Lemont Fillers FNP on 12/15/2010   Method used:   Faxed to ...       [facility] [place] [phone]* (retail)       [phone] [address], [place]  [address]       Ph: [phone] or [phone]       Fax: [phone]   RxID:   [id number] CEFTIN 500 MG TABS (CEFUROXIME AXETIL) one tablet by mouth two times a day for 10 days  #20 x 0   Entered and Authorized by:   Lemont Fillers FNP   Signed by:   Lemont Fillers FNP on 12/15/2010   Method used:   Electronically to        [facility] [phone]* (retail)       [address] [place]  [address]       Ph: [phone] or [phone]       Fax: [phone]   RxID:   [id number]    Orders Added: 1)  UA Dipstick w/o Micro (manual) [81002] 2)  Est. Patient Level III [24580] 3)  Prescription Created Electronically [  G8553]    Current Allergies (reviewed today): No known allergies    Laboratory Results   Urine Tests   Date/Time Reported: Mervin Kung CMA (AAMA)  December 15, 2010 2:21 PM   Routine Urinalysis   Color: yellow Appearance: Clear Glucose: negative   (Normal Range: Negative) Bilirubin: negative   (Normal Range: Negative) Ketone: negative   (Normal Range: Negative) Spec. Gravity: <1.005   (Normal Range: 1.003-1.035) Blood: trace-intact   (Normal Range: Negative) pH: 8.0   (Normal Range: 5.0-8.0) Protein: negative   (Normal Range: Negative) Urobilinogen: 0.2   (Normal Range: 0-1) Nitrite: negative   (Normal Range: Negative) Leukocyte Esterace: negative   (Normal Range: Negative)

## 2010-12-25 NOTE — Progress Notes (Signed)
Summary: additional recommendations  Phone Note Outgoing Call   Call placed by: Lemont Fillers FNP,  December 15, 2010 3:29 PM Call placed to: Specialist Summary of Call: Spoke with Dr. Corliss Skains.  She recommends prednisone taper- I have sent to patient's pharmacy.  She also recommended that she start plaquenil.  Please call patient and review these recommendations. Initial call taken by: Lemont Fillers FNP,  December 15, 2010 3:30 PM  Follow-up for Phone Call        Pt notified. Nicki Guadalajara Fergerson CMA Duncan Dull)  December 16, 2010 11:51 AM

## 2011-01-14 ENCOUNTER — Telehealth: Payer: Self-pay | Admitting: Family

## 2011-01-14 ENCOUNTER — Encounter: Payer: Self-pay | Admitting: Family

## 2011-01-14 ENCOUNTER — Ambulatory Visit (INDEPENDENT_AMBULATORY_CARE_PROVIDER_SITE_OTHER): Payer: Medicare Other | Admitting: Family

## 2011-01-14 ENCOUNTER — Other Ambulatory Visit: Payer: Self-pay | Admitting: Internal Medicine

## 2011-01-14 ENCOUNTER — Ambulatory Visit (HOSPITAL_BASED_OUTPATIENT_CLINIC_OR_DEPARTMENT_OTHER)
Admission: RE | Admit: 2011-01-14 | Discharge: 2011-01-14 | Disposition: A | Discharge status: Home / Self Care | Payer: Medicare Other | Source: Ambulatory Visit | Attending: Internal Medicine | Admitting: Internal Medicine

## 2011-01-14 DIAGNOSIS — R1031 Right lower quadrant pain: Secondary | ICD-10-CM | POA: Insufficient documentation

## 2011-01-14 DIAGNOSIS — K5900 Constipation, unspecified: Secondary | ICD-10-CM

## 2011-01-14 DIAGNOSIS — M069 Rheumatoid arthritis, unspecified: Secondary | ICD-10-CM

## 2011-01-14 LAB — CONVERTED CEMR LAB
BUN: 16 mg/dL (ref 6–23)
CO2: 25 meq/L (ref 19–32)
Calcium: 9.4 mg/dL (ref 8.4–10.5)
Chloride: 105 meq/L (ref 96–112)
Creatinine, Ser: 0.79 mg/dL (ref 0.40–1.20)
Glucose, Bld: 94 mg/dL (ref 70–99)
Potassium: 4.5 meq/L (ref 3.5–5.3)
Sodium: 141 meq/L (ref 135–145)

## 2011-01-14 MED ORDER — IOHEXOL 300 MG/ML  SOLN
100.0000 mL | Freq: Once | INTRAMUSCULAR | Status: AC | PRN
  Administered 2011-01-14: 100 mL via INTRAVENOUS

## 2011-01-20 NOTE — Progress Notes (Signed)
----   Converted from flag ---- ---- 01/14/2011 1:47 PM, Mervin Kung CMA (AAMA) wrote: Nancy Fetter in Imaging.  ---- 01/14/2011 12:29 PM, Lemont Fillers FNP wrote: Thank you.  Could you please ask them to call me with report on my cell? [phone].   ---- 01/14/2011 12:04 PM, Mervin Kung CMA Duncan Dull) wrote: Edwena Felty results given to [facility]. Pt is having CT @ 4:30 today. ------------------------------

## 2011-01-20 NOTE — Assessment & Plan Note (Signed)
Summary: 3 month fu/mhf--rm 5   Vital Signs:  Patient profile:   70 year old female Height:      66.5 inches Weight:      143.75 pounds BMI:     22.94 Temp:     98.2 degrees F oral Pulse rate:   72 / minute Pulse rhythm:   regular Resp:     16 per minute BP sitting:   136 / 64  (right arm) Cuff size:   regular  Vitals Entered By: Mervin Kung CMA Duncan Dull) (January 14, 2011 8:20 AM) CC: Pt here for 3 month follow up. Is Patient Diabetic? No Pain Assessment Patient in pain? no      Comments Pt completed prednisone and ceftin. Mervin Kung CMA Duncan Dull)  January 14, 2011 8:24 AM    Primary Care Provider:  Lemont Fillers FNP  CC:  Pt here for 3 month follow up.Marland Kitchen  History of Present Illness: Nancy [name] is a 70 year old female who presents today for follow up.  She presents today with chief complaint of constipation.    Constipation- notes that this has been going on for several years.  Has tried metamucil in the past, with some improvement, but quickly returns.  Dulcolax helps.  Symptoms worsened 1 week ago and are accompanied by lower abdominal pain.  Denies associated fever.  Small BM yesterday- prior to that she had not had BM since saturday3/3.  Denies hematochezia. Symptoms improved with use of metamucil.  RA- started plaquenil- reports considerable improvement in her symptoms.  She has f/u with Rheumatology on  3/23.       Allergies (verified): No Known Drug Allergies  Past History:  Past Medical History: Last updated: 12/17/2009 Allergic rhinitis Anemia-iron deficiency Depression Hyperlipidemia Hypertension Urinary incontinence Rheumatoid arthritis  Past Surgical History: Last updated: 12/17/2009 Hysterectomy  Review of Systems       see HPI  Physical Exam  General:  Well-developed,well-nourished,in no acute distress; alert,appropriate and cooperative throughout examination Head:  Normocephalic and atraumatic without obvious abnormalities. No  apparent alopecia or balding. Neck:  No deformities, masses, or tenderness noted. Lungs:  Normal respiratory effort, chest expands symmetrically. few bibasilar crackles noted. Heart:  Normal rate and regular rhythm. S1 and S2 normal without gallop, murmur, click, rub or other extra sounds. Abdomen:  soft abdomen, + BS, RLQ guarding with palpation. Extremities:  bilateral hands with decreased inflammation/swelling of PIP joints.  Some swelling noted bilateral knees.  R shoulder tender to palpation.   Impression & Recommendations:  Problem # 1:  RLQ PAIN (ICD-789.03) Assessment New Pt noted to have guarding with palpation of right lower quadrant.  Sent for CT abdomen and pelvis to rule out diverticulitis.  She tells me that she had appendectomy during her hysterectomy.  Orders: Misc. Referral (Misc. Ref)  Problem # 2:  CONSTIPATION (ICD-564.00) Assessment: New Pt notes improvement when she takes metamucil.  Plan to continue same.  Her updated medication list for this problem includes:    Metamucil 0.52 Gm Caps (Psyllium) ..... Start 2 caps by mouth once daily.  may increase up to 5 caps once daily as needed for regularity  Problem # 3:  ARTHRITIS, RHEUMATOID (ICD-714.0) Assessment: Improved Clinically improved on current regimen, pt to keep follow up with Dr. Corliss Skains.  Complete Medication List: 1)  Metoprolol Succinate 50 Mg Xr24h-tab (Metoprolol succinate) .... Take 1 tablet by mouth once a day 2)  Methotrexate 2.5 Mg Tabs (Methotrexate sodium) .... 8 tablets  by mouth once weekly 3)  Folic Acid 1 Mg Tabs (Folic acid) .... Take 1 tablet by mouth once a day 4)  Caltrate 600+d 600-400 Mg-unit Tabs (Calcium carbonate-vitamin d) .... One tablet by mouth bid 5)  Claritin 10 Mg Caps (Loratadine) .... One tablet by mouth daily as needed for allergies 6)  Metamucil 0.52 Gm Caps (Psyllium) .... Start 2 caps by mouth once daily.  may increase up to 5 caps once daily as needed for regularity 7)   Pravastatin Sodium 80 Mg Tabs (Pravastatin sodium) .... One tab by mouth once daily in the evening 8)  Plaquenil 200 Mg Tabs (Hydroxychloroquine sulfate) .... One tablet by mouth two times a day 9)  Sertraline Hcl 50 Mg Tabs (Sertraline hcl) .... One tablet by mouth once daily 10)  Alendronate Sodium 70 Mg Tabs (Alendronate sodium) .... One tablet by mouth once weekly on an empty stomach.  sit upright, no food or other medicines for 30 minutes after dose. 11)  Ibuprofen 800 Mg Tabs (Ibuprofen) .... Take 1 tablet every 8 hours as needed. 12)  Vitamin B-12 100 Mcg Tabs (Cyanocobalamin) .... One tablet by mouth daily  Other Orders: TLB-BMP (Basic Metabolic Panel-BMET) (80048-METABOL)  Patient Instructions: 1)  Please complete your lab work on the first floor this AM. 2)  Please complete your CT abdomen later this afternoon.  3)  We will call you with results. 4)  Follow up in 1 week.   Orders Added: 1)  TLB-BMP (Basic Metabolic Panel-BMET) [80048-METABOL] 2)  Misc. Referral [Misc. Ref] 3)  Est. Patient Level IV [[address]]    Current Allergies (reviewed today): No known allergies

## 2011-03-16 ENCOUNTER — Encounter: Payer: Self-pay | Admitting: Family

## 2011-03-18 ENCOUNTER — Encounter: Payer: Self-pay | Admitting: Family

## 2011-03-30 ENCOUNTER — Other Ambulatory Visit: Payer: Self-pay | Admitting: Family

## 2011-03-31 NOTE — Telephone Encounter (Signed)
Spoke to pt, Dr Corliss Skains has been managing her "joint pain" and pt has been out of Methotrexate and pt was requesting refill of Prednisone to help with her pain in place of Methotrexate. Advised pt to call Dr Fatima Sanger office tomorrow re: methotrexate refill or alternative treatment. We are denying Prednisone, pt voices understanding.

## 2011-04-02 ENCOUNTER — Other Ambulatory Visit: Payer: Self-pay | Admitting: Gastroenterology

## 2011-04-02 ENCOUNTER — Ambulatory Visit (INDEPENDENT_AMBULATORY_CARE_PROVIDER_SITE_OTHER): Payer: Medicare Other | Admitting: Gastroenterology

## 2011-04-02 ENCOUNTER — Encounter: Payer: Self-pay | Admitting: Gastroenterology

## 2011-04-02 VITALS — BP 171/91 | HR 65 | Temp 97.7°F | Ht 65.5 in | Wt 143.0 lb

## 2011-04-02 DIAGNOSIS — B181 Chronic viral hepatitis B without delta-agent: Secondary | ICD-10-CM | POA: Insufficient documentation

## 2011-04-02 LAB — HEPATIC FUNCTION PANEL
ALT: 9 U/L (ref 0–35)
AST: 14 U/L (ref 0–37)
Albumin: 4.2 g/dL (ref 3.5–5.2)
Alkaline Phosphatase: 68 U/L (ref 39–117)
Bilirubin, Direct: 0.1 mg/dL (ref 0.0–0.3)
Indirect Bilirubin: 0.3 mg/dL (ref 0.0–0.9)
Total Bilirubin: 0.4 mg/dL (ref 0.3–1.2)
Total Protein: 7 g/dL (ref 6.0–8.3)

## 2011-04-02 LAB — HEPATITIS B SURFACE ANTIBODY,QUALITATIVE: Hep B S Ab: NEGATIVE

## 2011-04-02 LAB — HEPATITIS B SURFACE ANTIGEN: Hepatitis B Surface Ag: NEGATIVE

## 2011-04-03 LAB — HEPATITIS B E ANTIBODY: Hepatitis Be Antibody: NONREACTIVE

## 2011-04-03 LAB — HEPATITIS B E ANTIGEN: Hepatitis Be Antigen: NONREACTIVE

## 2011-04-07 LAB — HEPATITIS B CORE ANTIBODY, TOTAL: Hep B Core Total Ab: POSITIVE — AB

## 2011-04-07 LAB — HEPATITIS B CORE ANTIBODY, IGM: Hep B C IgM: NEGATIVE

## 2011-04-09 LAB — HEPATITIS B DNA, ULTRAQUANTITATIVE, PCR

## 2011-04-09 NOTE — Progress Notes (Signed)
cc: Primary Care Physician:  Sandford Craze, FNP-BC, [facility], [address], [place], [place] [address], Fax [phone] Referring Physician:  Pollyann Savoy, MD, [facility], [address] [place], [place], [place] [address], Fax [phone]    Reason for referral:  Positive hepatitis B core IgM.    history:  The patient is a 70 year old woman who I have been asked to see in consultation by Dr. Corliss Skains regarding a positive hepatitis B core IgM antibody in the context of attempting to treat rheumatoid  arthritis.  According to the patient, she has no known history of liver disease. The patient was treated with Remicade while living in [place] for 4 months, and was unaware of her hepatitis B testing prior to commencing  therapy. She reports she was intolerant of it developing a psoriatic  skin reaction and therefore was discontinued. There were no adverse liver events that she was aware of. When she moved to the area, she  saw Dr. Corliss Skains regarding her rheumatoid arthritis. Dr. Corliss Skains did standard lab testing, I gather in anticipation of using a TNF-alpha inhibitor.  On 08/08/2009, her hepatitis B surface antigen was negative, but the hepatitis B core antibody IgM fraction was positive. At the same time her hepatitis C was antibody was negative.  Her liver enzymes were normal.  On followup testing on 08/16/2009, her hepatitis B DNA was negative.  On 05/13/2010, her liver enzymes were normal, and her Hepatitis B surface antibody was positive.  There are currently no symptoms to suggest liver disease nor are there symptoms to suggest decompensated liver disease.  With respect to risk factors for liver disease, she has not consumed any alcohol in years and before that rarely. There is no history of intravenous or intranasal drug use. There is no history of tattoos.  She received a blood transfusion in the 1960s when she  hemorrhaged after delivery of her child. There is a history of unsterile body piercing. There is no family history of liver disease. She cannot  recall receiving hepatitis A or B vaccination though she worked in a nursing in 1995.   Past MEDICAL HISTORY:  Significant for hypertension and osteoporosis. 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 There is a history of  degenerative disk disease of cervical spine, as well as the rheumatoid arthritis. There is also a history of dyslipidemia.   PAST SURGICAL HISTORY:  Hysterectomy in 1976, possible appendectomy at the same time, removal of tumor left leg 1979, but she cannot give further details of this.   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   CURRENT MEDICATIONS:  Alendronate 70 mg p.o. weekly, calcium carbonate with vitamin D 600 mg/40 units p.o., folic acid 1 mg daily, hydroxychloroquine 200 mg daily, ibuprofen 800 mg p.o. p.r.n., loratadine 10 mg p.o. daily,  methotrexate, metoprolol, 50 mg p.o. daily, pravastatin 80 mg p.o. daily, psyllium 0.52 g capsules daily, sertraline 50 mg p.o. daily, vitamin B12 100 mcg p.o. daily.    Allergies:  Denies.   habits:  Smoking a third a pack cigarettes per day. Alcohol as above.   FAMILY HISTORY:  As above.   Social history:  Has 8 children. She is divorced and retired from [employer] work.   REVIEW OF SYSTEMS:  All 10 systems reviewed today with the patient and they  are negative other than which is mentioned above. CES-D was 20.    PHYSICAL EXAMINATION:  Constitutional: Appeared well and stated age. Vital signs: Height 65.5 inches, weight 143 pounds, blood pressure 171/91, pulse 65, temperature 97.7 Fahrenheit.  Ears, nose, mouth and throat:   Unremarkable oropharynx.  No thyromegaly or neck masses.  Chest:  Resonant to percussion.  Clear to auscultation.   Cardiovascular:  Heart sounds normal S1, S2 without murmurs or rubs.  There is no  peripheral edema.  Abdominal:  Normal bowel sounds.  No masses or tenderness.  I could not appreciate a liver edge or spleen tip.  I could not appreciate any hernias.  Lymphatics:  No cervical or  inguinal lymphadenopathy.  Central Nervous System:  No asterixis or focal neurologic findings.  Dermatologic:  Anicteric without palmar  erythema or spider angiomata.  Eyes:  Anicteric sclerae.  Pupils are equal and reactive to light.   laboratories:  On 08/08/2009, ALT less than 8, AST 12, ALP 67, total bilirubin 0.7, creatinine 0.87, albumin 4.2. Her hepatitis B surface antigen was negative yet the IgM fraction of hepatitis B core antibody was  positive. Her hepatitis C antibody was negative.   On 08/16/2009, her hepatitis B DNA was not calculable and not detectable.   Repeat lab testing on 05/13/2010, AST was 12, ALT was 10. Hepatitis B surface antibody was positive.   ASSESSMENT:  The patient is a 70 year old woman with a history of a positive IgM fraction of hepatitis B core antibody without risk factors for liver disease, normal liver enzymes, and a negative hepatitis B surface  antigen and hepatitis B DNA. The serologies were discovered over a year and half ago, and they have not been repeated in a systematic basis to date, so we do not have an opportunity to follow these over  time to see if it is false positive or indicative of resolving infection.  At this time, I will repeat all the hepatitis B serologies.  In my discussion today with the patient, I discussed the issue with the positive hepatitis B core IgM antibody. I have explained to her it  may be a false-positive but we will need to confirm this by doing all the lab testing that I ordered today.   PLAN:  1. I have ordered hepatitis B surface antigen, antibody, hepatitis B E antigen, antibody, and HBV DNA. 2. Will order liver enzymes. 3. Will repeat the  hepatitis B core IgM and the total hepatitis B core antibody. 4. If all the serology is negative, will write her a letter to that effect and she would not need to return in follow up. 5. If the serologies are positive, will need to determine whether this is a true infection or a false positive.            Brooke Dare, MD    ADDENDUM:   Her liver enzymes are completely normal.  Her Hep B surface antigen and antibody are both negative.   Her Total Hep B core antibody is positive but the IgM fraction is negative.  The HBV DNA is pending.  At this time, though I still need the HBV DNA for the final recommendation, I suspect the Hep B core total and IgM tests were false positives.   Even if they could have indicated acute infection in 2010 (Hep B core IgM positive), the negative result now with a positive Total Hep B core antibody would indicate prior exposure and part of the profile  of labs indicating immunity, for which a TNFalpha inhibitor would be safe without the need for HBV monitoring.  403 .[id number]  D:  Thu May 24 21:11:30 2012 ; T:  Fri May 25 14:14:21 2012  Job #:  [id number]

## 2011-04-16 ENCOUNTER — Encounter: Payer: Self-pay | Admitting: Gastroenterology

## 2011-04-24 ENCOUNTER — Other Ambulatory Visit: Payer: Self-pay | Admitting: Family

## 2011-04-24 ENCOUNTER — Encounter: Payer: Self-pay | Admitting: Family Medicine

## 2011-04-24 ENCOUNTER — Ambulatory Visit (INDEPENDENT_AMBULATORY_CARE_PROVIDER_SITE_OTHER): Payer: Medicare Other | Admitting: Family Medicine

## 2011-04-24 VITALS — BP 112/74 | HR 65 | Temp 98.1°F | Resp 18 | Ht 66.5 in | Wt 140.0 lb

## 2011-04-24 DIAGNOSIS — Z1231 Encounter for screening mammogram for malignant neoplasm of breast: Secondary | ICD-10-CM

## 2011-04-24 DIAGNOSIS — L989 Disorder of the skin and subcutaneous tissue, unspecified: Secondary | ICD-10-CM

## 2011-04-24 MED ORDER — MUPIROCIN 2 % EX OINT: TOPICAL_OINTMENT | Freq: Three times a day (TID) | CUTANEOUS | Status: AC

## 2011-04-24 NOTE — Progress Notes (Signed)
OFFICE NOTE  04/24/2011  CC:  Chief Complaint  Patient presents with  . Medication Reaction    blisters of fingers onset last week     HPI:   Patient is a 70 y.o. African-American female who is here for rash on left hand 4th digit. Noted about 1 wk ago, very itchy but not painful.  No other areas of skin affected.   No fever, no malaise. She has RA, and the spot came up about 2 wks after restarting plaquenil.  She remembers having a similar rash---more fingers involved--in the past after getting a remicaid infusion.  So, she is suspicious that what she currently has is a drug rxn.  Pertinent PMH:  RA HTN Hyperlipidemia  MEDS;   Outpatient Prescriptions Prior to Visit  Medication Sig Dispense Refill  . Calcium Carbonate-Vitamin D (CALTRATE 600+D) 600-400 MG-UNIT per tablet Take 1 tablet by mouth 2 (two) times daily.        . folic acid (FOLVITE) 1 MG tablet Take 1 mg by mouth daily.        . hydroxychloroquine (PLAQUENIL) 200 MG tablet Take 200 mg by mouth 2 (two) times daily.        Marland Kitchen ibuprofen (ADVIL,MOTRIN) 800 MG tablet Take 800 mg by mouth every 8 (eight) hours as needed.        . loratadine (CLARITIN) 10 MG tablet Take 10 mg by mouth daily as needed. For allergies       . methotrexate (RHEUMATREX) 2.5 MG tablet Take 8 tabs by mouth once weekly.  Caution:Chemotherapy. Protect from light.       . metoprolol (TOPROL-XL) 50 MG 24 hr tablet Take 50 mg by mouth daily.        . vitamin B-12 (CYANOCOBALAMIN) 100 MCG tablet Take 100 mcg by mouth daily.        Marland Kitchen alendronate (FOSAMAX) 70 MG tablet Take 70 mg by mouth every 7 (seven) days. Take with a full glass of water on an empty stomach.  Sit upright, no food or other medicines for 30 minutes after dose.       . pravastatin (PRAVACHOL) 80 MG tablet Take 80 mg by mouth daily. In the evening.       . psyllium (METAMUCIL) 0.52 G capsule as directed.        . sertraline (ZOLOFT) 50 MG tablet Take 50 mg by mouth daily.        Prednisone  5mg  qd  PE: Blood pressure 112/74, pulse 65, temperature 98.1 F (36.7 C), temperature source Oral, resp. rate 18, height 5' 6.5" (1.689 m), weight 140 lb (63.504 kg), SpO2 98.00%. Gen: Alert, well appearing.  Patient is oriented to person, place, time, and situation. SKIN: near the proximal nail fold on the 4th finger of the left hand she has a 1 cm oval greyish swelling with punctate hole draining clear fluid.  There are 3-4 punctate papulopustular lesions trailing away from this on lateral edge of finger.  No tenderness, no erythema.  The nail itself is normal.  No streaking. Remainder of skin/nails normal.  IMPRESSION AND PLAN:  Skin lesion Looks like a paronychium but it is not abutting the nail, plus it has the punctate lesions next to it. I recommended we treat this like paronychium first--warm soaks x bid, express any draining contents, apply bactroban ointment tid. If not improving in 4-5 days, then d/c plaquenil for a week to see if lesion spontaneously resolves.  I advised her to notify Dr.  Devoshwar, her Rheumatologist, if she does decide to try temporary d/c of the plaquenil.     FOLLOW UP:  Return if symptoms worsen or fail to improve.

## 2011-04-24 NOTE — Assessment & Plan Note (Signed)
Looks like a paronychium but it is not abutting the nail, plus it has the punctate lesions next to it. I recommended we treat this like paronychium first--warm soaks x bid, express any draining contents, apply bactroban ointment tid. If not improving in 4-5 days, then d/c plaquenil for a week to see if lesion spontaneously resolves.  I advised her to notify Dr. Consuella Lose, her Rheumatologist, if she does decide to try temporary d/c of the plaquenil.

## 2011-04-27 ENCOUNTER — Telehealth: Payer: Self-pay | Admitting: Family

## 2011-04-27 MED ORDER — METOPROLOL SUCCINATE ER 50 MG PO TB24: 50.0000 mg | ORAL_TABLET | Freq: Every day | ORAL | Status: DC

## 2011-04-27 NOTE — Telephone Encounter (Signed)
Refill- metoprolol succ er 50mg  tab. Take 1 tablet by mouth once a day. Qty 30. Last fill 4.24.12

## 2011-04-28 ENCOUNTER — Ambulatory Visit (HOSPITAL_BASED_OUTPATIENT_CLINIC_OR_DEPARTMENT_OTHER)
Admission: RE | Admit: 2011-04-28 | Discharge: 2011-04-28 | Disposition: A | Discharge status: Home / Self Care | Payer: Medicare Other | Source: Ambulatory Visit | Attending: Family | Admitting: Family

## 2011-04-28 DIAGNOSIS — Z1231 Encounter for screening mammogram for malignant neoplasm of breast: Secondary | ICD-10-CM

## 2011-06-15 ENCOUNTER — Ambulatory Visit (INDEPENDENT_AMBULATORY_CARE_PROVIDER_SITE_OTHER): Payer: Medicare Other | Admitting: Family

## 2011-06-15 ENCOUNTER — Encounter: Payer: Self-pay | Admitting: Family

## 2011-06-15 DIAGNOSIS — M069 Rheumatoid arthritis, unspecified: Secondary | ICD-10-CM

## 2011-06-15 DIAGNOSIS — R609 Edema, unspecified: Secondary | ICD-10-CM

## 2011-06-15 DIAGNOSIS — L989 Disorder of the skin and subcutaneous tissue, unspecified: Secondary | ICD-10-CM

## 2011-06-15 MED ORDER — FUROSEMIDE 20 MG PO TABS: ORAL_TABLET | ORAL | Status: DC

## 2011-06-15 NOTE — Assessment & Plan Note (Signed)
Resolved

## 2011-06-15 NOTE — Progress Notes (Signed)
Subjective:    [name], female    DOB: [birth year], 70 y.o.   MRN: [id number]  HPI  [name] is a 70 yr old female who presents today for follow up.  Skin rash- she reports that this has resolved.  She continues plaquenil.  She reports that she is only taking one tablet a day instead of two tabs a day.    Hep B-  Saw ID. Was told that she may have been exposed in the past but that the infection is no longer present.   Swelling started about 1 month ago.  Notes that denies any shortness of breath.  She reports some chest wall tenderness but no chest pain.   Mood is good.  Notes that she feels better if she keeps moving.  Sleeps well.     Review of Systems    see HPI  Past Medical History  Diagnosis Date  . Allergy   . Anemia     iron deficiency  . Depression   . Hyperlipidemia   . Hypertension   . Urinary incontinence   . Arthritis     rheumatoid  . Hep C w/o coma, chronic     History   Social History  . Marital Status: Divorced    Spouse Name: N/A    Number of Children: 8  . Years of Education: N/A   Occupational History  .      Retired from [employer]   Social History Main Topics  . Smoking status: Current Everyday Smoker -- 32 years  . Smokeless tobacco: Not on file   Comment: 6-7 cigarettes a day.  . Alcohol Use: No  . Drug Use: No  . Sexually Active: Not on file   Other Topics Concern  . Not on file   Social History Narrative  . No narrative on file    Past Surgical History  Procedure Date  . Abdominal hysterectomy     Family History  Problem Relation Age of Onset  . Hypertension Father   . Heart disease Sister 16    CABG x 3  . Cirrhosis Brother   . Alcohol abuse Brother   . Colitis Daughter   . Hypertension Son   . Kidney disease Son     transplant due to kidney problems after [facility]  . Arthritis Other   . Coronary artery disease Other   . Hyperlipidemia Other   . Hypertension Other   . Stroke Other    . Arthritis Sister   . Hypertension Sister   . Thyroid disease Daughter   . Thyroid disease Daughter   . Thyroid disease Daughter     No Known Allergies  Current Outpatient Prescriptions on File Prior to Visit  Medication Sig Dispense Refill  . alendronate (FOSAMAX) 70 MG tablet Take 70 mg by mouth every 7 (seven) days. Take with a full glass of water on an empty stomach.  Sit upright, no food or other medicines for 30 minutes after dose.       . Calcium Carbonate-Vitamin D (CALTRATE 600+D) 600-400 MG-UNIT per tablet Take 1 tablet by mouth 2 (two) times daily.        . folic acid (FOLVITE) 1 MG tablet Take 1 mg by mouth daily.        . hydroxychloroquine (PLAQUENIL) 200 MG tablet Take 200 mg by mouth 2 (two) times daily.        Marland Kitchen ibuprofen (ADVIL,MOTRIN) 800 MG tablet Take 800  mg by mouth every 8 (eight) hours as needed.        . loratadine (CLARITIN) 10 MG tablet Take 10 mg by mouth daily as needed. For allergies       . methotrexate (RHEUMATREX) 2.5 MG tablet Take 8 tabs by mouth once weekly.  Caution:Chemotherapy. Protect from light.       . metoprolol (TOPROL-XL) 50 MG 24 hr tablet Take 1 tablet (50 mg total) by mouth daily.  30 tablet  1  . psyllium (METAMUCIL) 0.52 G capsule as directed.        . vitamin B-12 (CYANOCOBALAMIN) 100 MCG tablet Take 100 mcg by mouth daily.          BP 120/68  Pulse 66  Temp(Src) 98.1 F (36.7 C) (Oral)  Resp 16  Ht 5' 6.5" (1.689 m)  Wt 142 lb 1.3 oz (64.447 kg)  BMI 22.59 kg/m2     Objective:   Physical Exam  Constitutional: She appears well-developed and well-nourished.  Cardiovascular: Normal rate and regular rhythm.   Pulmonary/Chest: Effort normal and breath sounds normal.  Musculoskeletal:       1 -2 + bilateral lower extremity edema.   Psychiatric:       Mood is slightly flat today, but pleasant and cooperative.           Assessment & Plan:  Skin rash-  She has apt with Dr. Corliss Skains on 8/21.

## 2011-06-15 NOTE — Assessment & Plan Note (Signed)
Will add lasix prn.  If shortness of breath or symptoms worsen, consider echo.

## 2011-06-15 NOTE — Patient Instructions (Signed)
Please follow up in 1 month.  

## 2011-06-15 NOTE — Assessment & Plan Note (Signed)
This is managed by rheumatology.

## 2011-07-14 ENCOUNTER — Ambulatory Visit (INDEPENDENT_AMBULATORY_CARE_PROVIDER_SITE_OTHER): Payer: Medicare Other | Admitting: Family

## 2011-07-14 ENCOUNTER — Encounter: Payer: Self-pay | Admitting: Family

## 2011-07-14 DIAGNOSIS — I10 Essential (primary) hypertension: Secondary | ICD-10-CM

## 2011-07-14 DIAGNOSIS — M069 Rheumatoid arthritis, unspecified: Secondary | ICD-10-CM

## 2011-07-14 DIAGNOSIS — R609 Edema, unspecified: Secondary | ICD-10-CM

## 2011-07-14 DIAGNOSIS — F329 Major depressive disorder, single episode, unspecified: Secondary | ICD-10-CM

## 2011-07-14 DIAGNOSIS — F3289 Other specified depressive episodes: Secondary | ICD-10-CM

## 2011-07-14 LAB — BASIC METABOLIC PANEL
BUN: 21 mg/dL (ref 6–23)
CO2: 23 mEq/L (ref 19–32)
Calcium: 10.1 mg/dL (ref 8.4–10.5)
Chloride: 107 mEq/L (ref 96–112)
Creat: 0.78 mg/dL (ref 0.50–1.10)
Glucose, Bld: 92 mg/dL (ref 70–99)
Potassium: 4.6 mEq/L (ref 3.5–5.3)
Sodium: 142 mEq/L (ref 135–145)

## 2011-07-14 NOTE — Patient Instructions (Signed)
Complete your blood work on the first floor.  Follow up in 3 months, sooner if problems or concerns.

## 2011-07-14 NOTE — Progress Notes (Signed)
Subjective:    [name], female    DOB: 02-03-1941, 70 y.o.   MRN: [id number]  HPI  Nancy [name] is a 70 yr old female who presents today for follow up of her edema.  1) Edema- last visit she was started on lasix 20mg  once daily PRN.  She reports that she is using about 1-2 times a week. Overall swelling is improved.    2) Depression- reports that this remains well controlled.  3) RA- reports that she has had significant swelling.   She took a taper of prednisone which helped with the pain.  She is now on methotrexate injection. This is being managed by Dr. Corliss Skains.       Review of Systems see HPI    Past Medical History  Diagnosis Date  . Allergy   . Anemia     iron deficiency  . Depression   . Hyperlipidemia   . Hypertension   . Urinary incontinence   . Arthritis     rheumatoid  . Hep C w/o coma, chronic     History   Social History  . Marital Status: Divorced    Spouse Name: N/A    Number of Children: 8  . Years of Education: N/A   Occupational History  .      Retired from [employer]   Social History Main Topics  . Smoking status: Current Everyday Smoker -- 32 years  . Smokeless tobacco: Not on file   Comment: 6-7 cigarettes a day.  . Alcohol Use: No  . Drug Use: No  . Sexually Active: Not on file   Other Topics Concern  . Not on file   Social History Narrative  . No narrative on file    Past Surgical History  Procedure Date  . Abdominal hysterectomy     Family History  Problem Relation Age of Onset  . Hypertension Father   . Heart disease Sister 37    CABG x 3  . Cirrhosis Brother   . Alcohol abuse Brother   . Colitis Daughter   . Hypertension Son   . Kidney disease Son     transplant due to kidney problems after [facility]  . Arthritis Other   . Coronary artery disease Other   . Hyperlipidemia Other   . Hypertension Other   . Stroke Other   . Arthritis Sister   . Hypertension Sister   . Thyroid disease  Daughter   . Thyroid disease Daughter   . Thyroid disease Daughter     No Known Allergies  Current Outpatient Prescriptions on File Prior to Visit  Medication Sig Dispense Refill  . Calcium Carbonate-Vitamin D (CALTRATE 600+D) 600-400 MG-UNIT per tablet Take 1 tablet by mouth 2 (two) times daily.        . folic acid (FOLVITE) 1 MG tablet Take 1 mg by mouth 2 (two) times daily.       . hydroxychloroquine (PLAQUENIL) 200 MG tablet Take 200 mg by mouth 2 (two) times daily.        Marland Kitchen ibuprofen (ADVIL,MOTRIN) 800 MG tablet Take 800 mg by mouth every 8 (eight) hours as needed.        . loratadine (CLARITIN) 10 MG tablet Take 10 mg by mouth daily as needed. For allergies       . metoprolol (TOPROL-XL) 50 MG 24 hr tablet Take 1 tablet (50 mg total) by mouth daily.  30 tablet  1  . psyllium (METAMUCIL)  0.52 G capsule as directed.        Marland Kitchen alendronate (FOSAMAX) 70 MG tablet Take 70 mg by mouth every 7 (seven) days. Take with a full glass of water on an empty stomach.  Sit upright, no food or other medicines for 30 minutes after dose.       . furosemide (LASIX) 20 MG tablet One tablet daily as needed for swelling.  30 tablet  1  . vitamin B-12 (CYANOCOBALAMIN) 100 MCG tablet Take 100 mcg by mouth daily.          BP 130/80  Pulse 78  Temp(Src) 97.8 F (36.6 C) (Oral)  Resp 16  Ht 5' 6.5" (1.689 m)  Wt 143 lb 1.3 oz (64.901 kg)  BMI 22.75 kg/m2    Objective:   Physical Exam  Constitutional: She appears well-developed and well-nourished.  HENT:  Head: Normocephalic and atraumatic.  Cardiovascular: Normal rate and regular rhythm.   Pulmonary/Chest: Effort normal and breath sounds normal.  Musculoskeletal: She exhibits no edema.       Some swelling of the PIP joints of 1st finger bilaterally  Psychiatric: She has a normal mood and affect. Her behavior is normal. Judgment and thought content normal.          Assessment & Plan:

## 2011-07-15 ENCOUNTER — Encounter: Payer: Self-pay | Admitting: Family

## 2011-07-15 NOTE — Assessment & Plan Note (Signed)
This is reported as stable. She is not taking any medications currently.  Will monitor.

## 2011-07-15 NOTE — Assessment & Plan Note (Signed)
Reports feeling better since her recent round of prednisone.  Defer management to Rheumatology.

## 2011-07-15 NOTE — Assessment & Plan Note (Signed)
Improved.  Continue lasix on a PRN basis.  Check BMET today.

## 2011-07-23 ENCOUNTER — Other Ambulatory Visit: Payer: Self-pay | Admitting: Family

## 2011-07-31 ENCOUNTER — Encounter: Payer: Self-pay | Admitting: Family Medicine

## 2011-07-31 ENCOUNTER — Other Ambulatory Visit: Payer: Self-pay | Admitting: Family Medicine

## 2011-07-31 ENCOUNTER — Ambulatory Visit (INDEPENDENT_AMBULATORY_CARE_PROVIDER_SITE_OTHER): Payer: Medicare Other | Admitting: Family Medicine

## 2011-07-31 VITALS — BP 160/94 | HR 66 | Temp 97.8°F | Ht 66.5 in | Wt 143.8 lb

## 2011-07-31 DIAGNOSIS — R21 Rash and other nonspecific skin eruption: Secondary | ICD-10-CM

## 2011-07-31 NOTE — Progress Notes (Signed)
OFFICE NOTE  07/31/2011  CC:  Chief Complaint  Patient presents with  . exposed to mold  . spots on leg    on back of left leg- itching yesterday     HPI: Patient is a 70 y.o. African-American female who is here for rash. Noted a couple of days ago on back of left calf, a bit itchy.  Has not applied anything to it. No other skin lesions.  Has had black mold problem in her apartment due to excessive humidity in apt, has been getting it eradicated last couple of weeks and hasn't been in her home during that time but lived in potentially mold infested environment in apt prior to that for a while. Has mild chronic nasal allegy symptoms, claritin helps as does nasal saline spray. Pertinent PMH:  Rheumatoid arthritis, currently on plaquenil and methotrexate.  Pertinent Meds:  PE: Blood pressure 160/94, pulse 66, temperature 97.8 F (36.6 C), temperature source Oral, height 5' 6.5" (1.689 m), weight 143 lb 12.8 oz (65.227 kg), SpO2 99.00%. Gen: Alert, well appearing.  Patient is oriented to person, place, time, and situation. Chest: symmetric expansion, nonlabored respirations.  Clear and equal breath sounds in all lung fields.   CV: RRR, no m/r/g.  Peripheral pulses 2+ and symmetric. SKIN: back of left calf with approx 1-2 cm hyperpigmented macular lesion, small area of fissuring in the skin here, with slight indurated feel.  No papules, no vesicles, no tenderness, no erythema, no streaking, no nevus.  IMPRESSION AND PLAN: Rash, c/w small area of irritation or inflammation that is a bit excoriated. Fungal rash possible, esp given exposure history. I recommended she do trial of OTC lamisil cream and I also sent a scraping of the area for KOH prep and fungal culture.   FOLLOW UP: prn

## 2011-07-31 NOTE — Patient Instructions (Signed)
Buy over the counter cream called Lamisil (or generic/store brand for lamisil, active ingredient is terbinafine) and apply to the area twice daily.

## 2011-08-01 LAB — KOH PREP: RESULT - KOH: NONE SEEN

## 2011-08-03 NOTE — Progress Notes (Signed)
Quick Note:  Pls notify: initial lab test for mold/yeast (rash on leg) was NEGATIVE. The fungal culture takes 4 wks to do, so we'll call her when we get a result. Thx--PM ______

## 2011-08-27 LAB — CULTURE, FUNGUS WITHOUT SMEAR

## 2011-10-12 ENCOUNTER — Ambulatory Visit: Payer: Medicare Other | Admitting: Family

## 2011-10-16 ENCOUNTER — Encounter: Payer: Self-pay | Admitting: Family

## 2011-10-16 ENCOUNTER — Ambulatory Visit (INDEPENDENT_AMBULATORY_CARE_PROVIDER_SITE_OTHER): Payer: Medicare Other | Admitting: Family

## 2011-10-16 DIAGNOSIS — M069 Rheumatoid arthritis, unspecified: Secondary | ICD-10-CM

## 2011-10-16 DIAGNOSIS — D649 Anemia, unspecified: Secondary | ICD-10-CM

## 2011-10-16 DIAGNOSIS — E538 Deficiency of other specified B group vitamins: Secondary | ICD-10-CM

## 2011-10-16 DIAGNOSIS — F3289 Other specified depressive episodes: Secondary | ICD-10-CM

## 2011-10-16 DIAGNOSIS — I10 Essential (primary) hypertension: Secondary | ICD-10-CM

## 2011-10-16 DIAGNOSIS — R609 Edema, unspecified: Secondary | ICD-10-CM

## 2011-10-16 DIAGNOSIS — Z23 Encounter for immunization: Secondary | ICD-10-CM

## 2011-10-16 DIAGNOSIS — M810 Age-related osteoporosis without current pathological fracture: Secondary | ICD-10-CM

## 2011-10-16 DIAGNOSIS — F329 Major depressive disorder, single episode, unspecified: Secondary | ICD-10-CM

## 2011-10-16 NOTE — Assessment & Plan Note (Signed)
This is stable. Especially now that she has been volunteering to help a young child. She is not on medication.  Monitor.

## 2011-10-16 NOTE — Patient Instructions (Addendum)
Please complete your lab work prior to leaving today. Follow up in 3 months, sooner if problems/concerns.

## 2011-10-16 NOTE — Assessment & Plan Note (Signed)
Stable off of lasix.

## 2011-10-16 NOTE — Assessment & Plan Note (Signed)
We discussed the importance of treating her.  She stopped fosamax due to risk of side effects.  She has not had bone density in >2 yrs. Will obtain and consider Reclast infusion.  For now, I have advised pt to resume fosamax. We discussed that her frequent use of prednisone further thins her bones making treatment that much more important.

## 2011-10-16 NOTE — Progress Notes (Signed)
Subjective:    [name], female    DOB: Mar 15, 1941, 70 y.o.   MRN: [id number]  HPI  Ms.  Nancy [name] is a 70 yr old female here today for routine follow up:  1) Edema- reports that she has not been using the lasix. Tells me that rheum thinks that it is just inflammation of her ankles due to RA.  2) RA- she is following with Dr. Corliss Skains- she reports significant hand/foot pain some days.   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  She is injoying being out of the home.    4) HTN-  She is on toprol- no problems. Denies chest pain or sob.      Review of Systems See HPI  Past Medical History  Diagnosis Date  . Allergy   . Anemia     iron deficiency  . Depression   . Hyperlipidemia   . Hypertension   . Urinary incontinence   . Arthritis     rheumatoid  . Hep C w/o coma, chronic     History   Social History  . Marital Status: Divorced    Spouse Name: N/A    Number of Children: 8  . Years of Education: N/A   Occupational History  .      Retired from [employer]   Social History Main Topics  . Smoking status: Current Everyday Smoker -- 32 years    Types: Cigarettes  . Smokeless tobacco: Never Used   Comment: 6-7 cigarettes a day.  . Alcohol Use: No  . Drug Use: No  . Sexually Active: Not on file   Other Topics Concern  . Not on file   Social History Narrative  . No narrative on file    Past Surgical History  Procedure Date  . Abdominal hysterectomy     Family History  Problem Relation Age of Onset  . Hypertension Father   . Heart disease Sister 59    CABG x 3  . Cirrhosis Brother   . Alcohol abuse Brother   . Colitis Daughter   . Hypertension Son   . Kidney disease Son     transplant due to kidney problems after [facility]  . Arthritis Other   . Coronary artery disease Other   . Hyperlipidemia Other   . Hypertension Other   . Stroke Other   . Arthritis Sister    . Hypertension Sister   . Thyroid disease Daughter   . Thyroid disease Daughter   . Thyroid disease Daughter     No Known Allergies  Current Outpatient Prescriptions on File Prior to Visit  Medication Sig Dispense Refill  . Calcium Carbonate-Vitamin D (CALTRATE 600+D) 600-400 MG-UNIT per tablet Take 1 tablet by mouth 2 (two) times daily.        . folic acid (FOLVITE) 1 MG tablet Take 1 mg by mouth 2 (two) times daily.       . hydroxychloroquine (PLAQUENIL) 200 MG tablet Take 200 mg by mouth 2 (two) times daily.        Marland Kitchen ibuprofen (ADVIL,MOTRIN) 800 MG tablet Take 800 mg by mouth every 8 (eight) hours as needed.        . loratadine (CLARITIN) 10 MG tablet Take 10 mg by mouth daily as needed. For allergies       . methotrexate 1 G injection Inject 0.8 mg into the muscle once a  week.        . metoprolol (TOPROL-XL) 50 MG 24 hr tablet TAKE 1 TABLET BY MOUTH EVERY DAY  30 tablet  2  . psyllium (METAMUCIL) 0.52 G capsule as directed.        . vitamin B-12 (CYANOCOBALAMIN) 100 MCG tablet Take 100 mcg by mouth daily.          BP 130/84  Pulse 66  Temp(Src) 98 F (36.7 C) (Oral)  Resp 16  Ht 5' 6.5" (1.689 m)  Wt 142 lb 1.3 oz (64.447 kg)  BMI 22.59 kg/m2       Objective:   Physical Exam  Constitutional: She appears well-developed and well-nourished. No distress.  Cardiovascular: Normal rate and regular rhythm.   No murmur heard. Pulmonary/Chest: Effort normal and breath sounds normal. No respiratory distress. She has no wheezes. She has no rales. She exhibits no tenderness.  Neurological:       Joint swelling noted in hands and ankles.  Psychiatric: She has a normal mood and affect. Her behavior is normal. Judgment and thought content normal.          Assessment & Plan:

## 2011-10-16 NOTE — Assessment & Plan Note (Signed)
This is being managed by Dr. Corliss Skains Rheum.

## 2011-10-17 LAB — BASIC METABOLIC PANEL
BUN: 14 mg/dL (ref 6–23)
CO2: 26 mEq/L (ref 19–32)
Calcium: 9.6 mg/dL (ref 8.4–10.5)
Chloride: 105 mEq/L (ref 96–112)
Creat: 0.75 mg/dL (ref 0.50–1.10)
Glucose, Bld: 90 mg/dL (ref 70–99)
Potassium: 4.7 mEq/L (ref 3.5–5.3)
Sodium: 139 mEq/L (ref 135–145)

## 2011-10-18 ENCOUNTER — Encounter: Payer: Self-pay | Admitting: Family

## 2011-10-23 ENCOUNTER — Ambulatory Visit
Admission: RE | Admit: 2011-10-23 | Discharge: 2011-10-23 | Disposition: A | Discharge status: Home / Self Care | Payer: Medicare Other | Source: Ambulatory Visit

## 2011-10-23 ENCOUNTER — Telehealth: Payer: Self-pay | Admitting: *Deleted

## 2011-10-23 NOTE — Telephone Encounter (Signed)
Received call from [facility] radiology stating pt had bone density performed 09/17/10. Today's test has been cancelled.

## 2011-10-27 ENCOUNTER — Telehealth: Payer: Self-pay | Admitting: *Deleted

## 2011-10-27 NOTE — Telephone Encounter (Signed)
Spoke to [place] at [facility]. She states they only received an SST on 10/16/11 and they did not receive the requisition or specimens for the vit d. b12 or cbc. Do we need to have pt come back to the lab to obtain or can they be checked at her f/u in March? Please advise.

## 2011-10-27 NOTE — Telephone Encounter (Signed)
Will check in March.

## 2011-10-27 NOTE — Telephone Encounter (Signed)
Message copied by Kathi Simpers on Tue Oct 27, 2011  9:04 AM ------      Message from: O'SULLIVAN, MELISSA      Created: Mon Oct 26, 2011 11:25 AM       Could you pls check with [facility] re: B12, CBC and vitamin D ordered during her last visit? Only BMET resulted. thanks

## 2011-11-06 ENCOUNTER — Ambulatory Visit: Payer: Self-pay

## 2011-11-26 ENCOUNTER — Other Ambulatory Visit: Payer: Self-pay | Admitting: Family

## 2011-12-24 DIAGNOSIS — Z79899 Other long term (current) drug therapy: Secondary | ICD-10-CM | POA: Diagnosis not present

## 2012-01-11 ENCOUNTER — Encounter: Payer: Self-pay | Admitting: Family

## 2012-01-11 ENCOUNTER — Ambulatory Visit (INDEPENDENT_AMBULATORY_CARE_PROVIDER_SITE_OTHER): Payer: Medicare Other | Admitting: Family

## 2012-01-11 ENCOUNTER — Ambulatory Visit (HOSPITAL_BASED_OUTPATIENT_CLINIC_OR_DEPARTMENT_OTHER)
Admission: RE | Admit: 2012-01-11 | Discharge: 2012-01-11 | Disposition: A | Discharge status: Home / Self Care | Payer: Medicare Other | Source: Ambulatory Visit | Attending: Family | Admitting: Family

## 2012-01-11 ENCOUNTER — Other Ambulatory Visit: Payer: Self-pay | Admitting: Family

## 2012-01-11 VITALS — BP 126/60 | HR 68 | Temp 97.8°F | Resp 16 | Wt 137.1 lb

## 2012-01-11 DIAGNOSIS — R634 Abnormal weight loss: Secondary | ICD-10-CM | POA: Diagnosis not present

## 2012-01-11 DIAGNOSIS — R0602 Shortness of breath: Secondary | ICD-10-CM | POA: Insufficient documentation

## 2012-01-11 DIAGNOSIS — F17200 Nicotine dependence, unspecified, uncomplicated: Secondary | ICD-10-CM

## 2012-01-11 DIAGNOSIS — R5381 Other malaise: Secondary | ICD-10-CM | POA: Diagnosis not present

## 2012-01-11 DIAGNOSIS — D649 Anemia, unspecified: Secondary | ICD-10-CM

## 2012-01-11 DIAGNOSIS — E038 Other specified hypothyroidism: Secondary | ICD-10-CM

## 2012-01-11 DIAGNOSIS — M47814 Spondylosis without myelopathy or radiculopathy, thoracic region: Secondary | ICD-10-CM | POA: Insufficient documentation

## 2012-01-11 DIAGNOSIS — M545 Low back pain, unspecified: Secondary | ICD-10-CM | POA: Diagnosis not present

## 2012-01-11 DIAGNOSIS — I700 Atherosclerosis of aorta: Secondary | ICD-10-CM | POA: Diagnosis not present

## 2012-01-11 DIAGNOSIS — R079 Chest pain, unspecified: Secondary | ICD-10-CM | POA: Diagnosis not present

## 2012-01-11 DIAGNOSIS — M549 Dorsalgia, unspecified: Secondary | ICD-10-CM | POA: Insufficient documentation

## 2012-01-11 DIAGNOSIS — I10 Essential (primary) hypertension: Secondary | ICD-10-CM | POA: Insufficient documentation

## 2012-01-11 DIAGNOSIS — M810 Age-related osteoporosis without current pathological fracture: Secondary | ICD-10-CM

## 2012-01-11 NOTE — Assessment & Plan Note (Signed)
BP is stable on Toprol, continue same.

## 2012-01-11 NOTE — Patient Instructions (Addendum)
Complete your blood work prior to leaving.  Please complete your chest xray on the first floor.  Follow up in 3 months, sooner if symptoms worsen or if your symptoms do not improve.

## 2012-01-11 NOTE — Assessment & Plan Note (Signed)
I advised pt that the safety of electronic cigarettes is not well known at this point.  I did encourage her to work on quitting smoking.

## 2012-01-11 NOTE — Progress Notes (Signed)
Subjective:    [name], female    DOB: 13-Oct-1941, 71 y.o.   MRN: [id number]  HPI  [name] is a 71 yr old female who presents today with several concerns.   Had cough/cold.  These symptoms are improving.  She reports pain in the left mid back comes up her neck.  Pain is worse with turning.  Pain is improved by use of ibuprofen.    Fatigue- reports that she has felt this way for the last 2 weeks. Denies known fever.  Appetite is only fair this week.  Notes + weight loss- about 3 pounds in the last 1 month.  Feels off balance.    Tobacco abuse-    Wants to quit smoking.  She is interested in electric cigarettes.    Review of Systems  Constitutional: Positive for fatigue and unexpected weight change.  Genitourinary: Negative for dysuria.  Musculoskeletal: Positive for arthralgias.      see HPI  Past Medical History  Diagnosis Date  . Allergy   . Anemia     iron deficiency  . Depression   . Hyperlipidemia   . Hypertension   . Urinary incontinence   . Arthritis     rheumatoid  . Hep C w/o coma, chronic     History   Social History  . Marital Status: Divorced    Spouse Name: N/A    Number of Children: 8  . Years of Education: N/A   Occupational History  .      Retired from [employer]   Social History Main Topics  . Smoking status: Current Everyday Smoker -- 32 years    Types: Cigarettes  . Smokeless tobacco: Never Used   Comment: 6-7 cigarettes a day.  . Alcohol Use: No  . Drug Use: No  . Sexually Active: Not on file   Other Topics Concern  . Not on file   Social History Narrative  . No narrative on file    Past Surgical History  Procedure Date  . Abdominal hysterectomy     Family History  Problem Relation Age of Onset  . Hypertension Father   . Heart disease Sister 13    CABG x 3  . Cirrhosis Brother   . Alcohol abuse Brother   . Colitis Daughter   . Hypertension Son   . Kidney disease Son     transplant due to  kidney problems after [facility]  . Arthritis Other   . Coronary artery disease Other   . Hyperlipidemia Other   . Hypertension Other   . Stroke Other   . Arthritis Sister   . Hypertension Sister   . Thyroid disease Daughter   . Thyroid disease Daughter   . Thyroid disease Daughter     No Known Allergies  Current Outpatient Prescriptions on File Prior to Visit  Medication Sig Dispense Refill  . alendronate (FOSAMAX) 70 MG tablet Take 70 mg by mouth every 7 (seven) days. Take with a full glass of water on an empty stomach.       . Calcium Carbonate-Vitamin D (CALTRATE 600+D) 600-400 MG-UNIT per tablet Take 1 tablet by mouth 2 (two) times daily.        . folic acid (FOLVITE) 1 MG tablet Take 1 mg by mouth 2 (two) times daily.       . hydroxychloroquine (PLAQUENIL) 200 MG tablet Take 200 mg by mouth 2 (two) times daily.        Marland Kitchen  ibuprofen (ADVIL,MOTRIN) 800 MG tablet Take 800 mg by mouth every 8 (eight) hours as needed.        . loratadine (CLARITIN) 10 MG tablet Take 10 mg by mouth daily as needed. For allergies       . methotrexate 1 G injection Inject 0.8 mg into the muscle once a week.        . metoprolol succinate (TOPROL-XL) 50 MG 24 hr tablet TAKE 1 TABLET BY MOUTH EVERY DAY  30 tablet  2  . psyllium (METAMUCIL) 0.52 G capsule as directed.        . vitamin B-12 (CYANOCOBALAMIN) 100 MCG tablet Take 100 mcg by mouth daily.          BP 126/60  Pulse 68  Temp(Src) 97.8 F (36.6 C) (Oral)  Resp 16  Wt 137 lb 1.3 oz (62.179 kg)  SpO2 99%    Objective:   Physical Exam  Constitutional: She appears well-developed and well-nourished. No distress.  HENT:  Head: Normocephalic.  Eyes: Pupils are equal, round, and reactive to light. No scleral icterus.  Cardiovascular: Normal rate and regular rhythm.   No murmur heard. Pulmonary/Chest: Effort normal and breath sounds normal. No respiratory distress. She has no wheezes. She has no rales. She exhibits no tenderness.  Abdominal:  Soft. Bowel sounds are normal.  Musculoskeletal: She exhibits no edema.  Skin: Skin is warm and dry. No rash noted. No erythema. No pallor.  Psychiatric: She has a normal mood and affect. Her behavior is normal. Judgment and thought content normal.          Assessment & Plan:

## 2012-01-11 NOTE — Assessment & Plan Note (Signed)
Given recent URI symptoms/cough.  Will obtain cxr to exclude pna as cause for her back pain.

## 2012-01-11 NOTE — Assessment & Plan Note (Signed)
Did not complete dexa as radiology told her it was too soon.  Will request date of last dexa as our records show 11/11.

## 2012-01-12 ENCOUNTER — Telehealth: Payer: Self-pay | Admitting: *Deleted

## 2012-01-12 ENCOUNTER — Telehealth: Payer: Self-pay | Admitting: Family

## 2012-01-12 DIAGNOSIS — E0590 Thyrotoxicosis, unspecified without thyrotoxic crisis or storm: Secondary | ICD-10-CM

## 2012-01-12 DIAGNOSIS — E039 Hypothyroidism, unspecified: Secondary | ICD-10-CM | POA: Insufficient documentation

## 2012-01-12 LAB — CBC WITH DIFFERENTIAL/PLATELET
Basophils Absolute: 0 10*3/uL (ref 0.0–0.1)
Basophils Relative: 0 % (ref 0–1)
Eosinophils Absolute: 0.3 10*3/uL (ref 0.0–0.7)
Eosinophils Relative: 3 % (ref 0–5)
HCT: 37.6 % (ref 36.0–46.0)
Hemoglobin: 11.7 g/dL — ABNORMAL LOW (ref 12.0–15.0)
Lymphocytes Relative: 27 % (ref 12–46)
Lymphs Abs: 2.6 10*3/uL (ref 0.7–4.0)
MCH: 26.1 pg (ref 26.0–34.0)
MCHC: 31.1 g/dL (ref 30.0–36.0)
MCV: 83.7 fL (ref 78.0–100.0)
Monocytes Absolute: 0.6 10*3/uL (ref 0.1–1.0)
Monocytes Relative: 7 % (ref 3–12)
Neutro Abs: 6.2 10*3/uL (ref 1.7–7.7)
Neutrophils Relative %: 63 % (ref 43–77)
Platelets: 387 10*3/uL (ref 150–400)
RBC: 4.49 MIL/uL (ref 3.87–5.11)
RDW: 15.5 % (ref 11.5–15.5)
WBC: 9.7 10*3/uL (ref 4.0–10.5)

## 2012-01-12 LAB — T3, FREE: T3, Free: 3.2 pg/mL (ref 2.3–4.2)

## 2012-01-12 LAB — TSH: TSH: 0.062 u[IU]/mL — ABNORMAL LOW (ref 0.350–4.500)

## 2012-01-12 LAB — T4, FREE: Free T4: 1.48 ng/dL (ref 0.80–1.80)

## 2012-01-12 NOTE — Telephone Encounter (Signed)
Message copied by Kathi Simpers on Tue Jan 12, 2012  5:16 PM ------      Message from: O'SULLIVAN, MELISSA      Created: Tue Jan 12, 2012  4:51 PM       Could you pls call lab and request free T3/free T4 to be added on to yesterdays labs? (hyperthyroid)

## 2012-01-12 NOTE — Telephone Encounter (Signed)
Tests added per Annice Pih at [facility].

## 2012-01-12 NOTE — Telephone Encounter (Signed)
Spoke with pt and let her know that her thyroid is appearing overactive.  This could explain her weight loss and fatigue. I would like to refer her to see an endocrinologist to further evaluate her thyroid. Pt is agreeable.

## 2012-01-15 ENCOUNTER — Ambulatory Visit: Payer: Medicare Other | Admitting: Family

## 2012-02-04 DIAGNOSIS — R946 Abnormal results of thyroid function studies: Secondary | ICD-10-CM | POA: Diagnosis not present

## 2012-02-04 DIAGNOSIS — E559 Vitamin D deficiency, unspecified: Secondary | ICD-10-CM | POA: Diagnosis not present

## 2012-02-04 DIAGNOSIS — R5381 Other malaise: Secondary | ICD-10-CM | POA: Diagnosis not present

## 2012-02-08 ENCOUNTER — Telehealth: Payer: Self-pay | Admitting: Family

## 2012-02-08 NOTE — Telephone Encounter (Signed)
Opened in Error.

## 2012-02-16 ENCOUNTER — Encounter (HOSPITAL_COMMUNITY): Payer: Self-pay | Admitting: Emergency Medicine

## 2012-02-16 ENCOUNTER — Emergency Department (HOSPITAL_COMMUNITY)
Admission: EM | Admit: 2012-02-16 | Discharge: 2012-02-16 | Disposition: A | Discharge status: Home / Self Care | Payer: Medicare Other | Attending: Emergency Medicine | Admitting: Emergency Medicine

## 2012-02-16 DIAGNOSIS — Z79899 Other long term (current) drug therapy: Secondary | ICD-10-CM | POA: Insufficient documentation

## 2012-02-16 DIAGNOSIS — M2550 Pain in unspecified joint: Secondary | ICD-10-CM | POA: Insufficient documentation

## 2012-02-16 DIAGNOSIS — M069 Rheumatoid arthritis, unspecified: Secondary | ICD-10-CM | POA: Diagnosis not present

## 2012-02-16 DIAGNOSIS — E785 Hyperlipidemia, unspecified: Secondary | ICD-10-CM | POA: Diagnosis not present

## 2012-02-16 DIAGNOSIS — M542 Cervicalgia: Secondary | ICD-10-CM | POA: Insufficient documentation

## 2012-02-16 DIAGNOSIS — M79609 Pain in unspecified limb: Secondary | ICD-10-CM | POA: Diagnosis not present

## 2012-02-16 DIAGNOSIS — R609 Edema, unspecified: Secondary | ICD-10-CM | POA: Diagnosis not present

## 2012-02-16 DIAGNOSIS — Z8619 Personal history of other infectious and parasitic diseases: Secondary | ICD-10-CM | POA: Diagnosis not present

## 2012-02-16 DIAGNOSIS — IMO0001 Reserved for inherently not codable concepts without codable children: Secondary | ICD-10-CM | POA: Diagnosis not present

## 2012-02-16 DIAGNOSIS — M25549 Pain in joints of unspecified hand: Secondary | ICD-10-CM | POA: Diagnosis not present

## 2012-02-16 DIAGNOSIS — F3289 Other specified depressive episodes: Secondary | ICD-10-CM | POA: Insufficient documentation

## 2012-02-16 DIAGNOSIS — M7989 Other specified soft tissue disorders: Secondary | ICD-10-CM | POA: Insufficient documentation

## 2012-02-16 DIAGNOSIS — M549 Dorsalgia, unspecified: Secondary | ICD-10-CM | POA: Insufficient documentation

## 2012-02-16 DIAGNOSIS — F329 Major depressive disorder, single episode, unspecified: Secondary | ICD-10-CM | POA: Insufficient documentation

## 2012-02-16 DIAGNOSIS — I10 Essential (primary) hypertension: Secondary | ICD-10-CM | POA: Insufficient documentation

## 2012-02-16 DIAGNOSIS — M25519 Pain in unspecified shoulder: Secondary | ICD-10-CM | POA: Diagnosis not present

## 2012-02-16 MED ORDER — PREDNISONE 20 MG PO TABS: 20.0000 mg | ORAL_TABLET | Freq: Every day | ORAL | Status: DC

## 2012-02-16 MED ORDER — PREDNISONE 20 MG PO TABS
40.0000 mg | ORAL_TABLET | Freq: Once | ORAL | Status: AC
  Administered 2012-02-16: 40 mg via ORAL
  Filled 2012-02-16: qty 2

## 2012-02-16 MED ORDER — DIAZEPAM 5 MG PO TABS
5.0000 mg | ORAL_TABLET | Freq: Once | ORAL | Status: AC
  Administered 2012-02-16: 5 mg via ORAL
  Filled 2012-02-16: qty 1

## 2012-02-16 MED ORDER — HYDROMORPHONE HCL PF 1 MG/ML IJ SOLN
1.0000 mg | Freq: Once | INTRAMUSCULAR | Status: AC
  Administered 2012-02-16: 1 mg via INTRAMUSCULAR
  Filled 2012-02-16: qty 1

## 2012-02-16 MED ORDER — OXYCODONE-ACETAMINOPHEN 5-325 MG PO TABS: 1.0000 | ORAL_TABLET | ORAL | Status: AC | PRN

## 2012-02-16 NOTE — Discharge Instructions (Signed)
Arthralgia Your caregiver has diagnosed you as suffering from an arthralgia. Arthralgia means there is pain in a joint. This can come from many reasons including:  Bruising the joint which causes soreness (inflammation) in the joint.   Wear and tear on the joints which occur as we grow older (osteoarthritis).   Overusing the joint.   Various forms of arthritis.   Infections of the joint.  Regardless of the cause of pain in your joint, most of these different pains respond to anti-inflammatory drugs and rest. The exception to this is when a joint is infected, and these cases are treated with antibiotics, if it is a bacterial infection. HOME CARE INSTRUCTIONS   Rest the injured area for as long as directed by your caregiver. Then slowly start using the joint as directed by your caregiver and as the pain allows. Crutches as directed may be useful if the ankles, knees or hips are involved. If the knee was splinted or casted, continue use and care as directed. If an stretchy or elastic wrapping bandage has been applied today, it should be removed and re-applied every 3 to 4 hours. It should not be applied tightly, but firmly enough to keep swelling down. Watch toes and feet for swelling, bluish discoloration, coldness, numbness or excessive pain. If any of these problems (symptoms) occur, remove the ace bandage and re-apply more loosely. If these symptoms persist, contact your caregiver or return to this location.   For the first 24 hours, keep the injured extremity elevated on pillows while lying down.   Apply ice for 15 to 20 minutes to the sore joint every couple hours while awake for the first half day. Then 3 to 4 times per day for the first 48 hours. Put the ice in a plastic bag and place a towel between the bag of ice and your skin.   Wear any splinting, casting, elastic bandage applications, or slings as instructed.   Only take over-the-counter or prescription medicines for pain,  discomfort, or fever as directed by your caregiver. Do not use aspirin immediately after the injury unless instructed by your physician. Aspirin can cause increased bleeding and bruising of the tissues.   If you were given crutches, continue to use them as instructed and do not resume weight bearing on the sore joint until instructed.  Persistent pain and inability to use the sore joint as directed for more than 2 to 3 days are warning signs indicating that you should see a caregiver for a follow-up visit as soon as possible. Initially, a hairline fracture (break in bone) may not be evident on X-rays. Persistent pain and swelling indicate that further evaluation, non-weight bearing or use of the joint (use of crutches or slings as instructed), or further X-rays are indicated. X-rays may sometimes not show a small fracture until a week or 10 days later. Make a follow-up appointment with your own caregiver or one to whom we have referred you. A radiologist (specialist in reading X-rays) may read your X-rays. Make sure you know how you are to obtain your X-ray results. Do not assume everything is normal if you do not hear from us. SEEK MEDICAL CARE IF: Bruising, swelling, or pain increases. SEEK IMMEDIATE MEDICAL CARE IF:   Your fingers or toes are numb or blue.   The pain is not responding to medications and continues to stay the same or get worse.   The pain in your joint becomes severe.   You develop a fever over   102 F (38.9 C).   It becomes impossible to move or use the joint.  MAKE SURE YOU:   Understand these instructions.   Will watch your condition.   Will get help right away if you are not doing well or get worse.  Document Released: 10/26/2005 Document Revised: 10/15/2011 Document Reviewed: 06/13/2008 Exit[employer] Patient Information 2012 [employer].Arthralgia Your caregiver has diagnosed you as suffering from an arthralgia. Arthralgia means there is pain in a joint. This can come  from many reasons including:  Bruising the joint which causes soreness (inflammation) in the joint.   Wear and tear on the joints which occur as we grow older (osteoarthritis).   Overusing the joint.   Various forms of arthritis.   Infections of the joint.  Regardless of the cause of pain in your joint, most of these different pains respond to anti-inflammatory drugs and rest. The exception to this is when a joint is infected, and these cases are treated with antibiotics, if it is a bacterial infection. HOME CARE INSTRUCTIONS   Rest the injured area for as long as directed by your caregiver. Then slowly start using the joint as directed by your caregiver and as the pain allows. Crutches as directed may be useful if the ankles, knees or hips are involved. If the knee was splinted or casted, continue use and care as directed. If an stretchy or elastic wrapping bandage has been applied today, it should be removed and re-applied every 3 to 4 hours. It should not be applied tightly, but firmly enough to keep swelling down. Watch toes and feet for swelling, bluish discoloration, coldness, numbness or excessive pain. If any of these problems (symptoms) occur, remove the ace bandage and re-apply more loosely. If these symptoms persist, contact your caregiver or return to this location.   For the first 24 hours, keep the injured extremity elevated on pillows while lying down.   Apply ice for 15 to 20 minutes to the sore joint every couple hours while awake for the first half day. Then 3 to 4 times per day for the first 48 hours. Put the ice in a plastic bag and place a towel between the bag of ice and your skin.   Wear any splinting, casting, elastic bandage applications, or slings as instructed.   Only take over-the-counter or prescription medicines for pain, discomfort, or fever as directed by your caregiver. Do not use aspirin immediately after the injury unless instructed by your physician. Aspirin  can cause increased bleeding and bruising of the tissues.   If you were given crutches, continue to use them as instructed and do not resume weight bearing on the sore joint until instructed.  Persistent pain and inability to use the sore joint as directed for more than 2 to 3 days are warning signs indicating that you should see a caregiver for a follow-up visit as soon as possible. Initially, a hairline fracture (break in bone) may not be evident on X-rays. Persistent pain and swelling indicate that further evaluation, non-weight bearing or use of the joint (use of crutches or slings as instructed), or further X-rays are indicated. X-rays may sometimes not show a small fracture until a week or 10 days later. Make a follow-up appointment with your own caregiver or one to whom we have referred you. A radiologist (specialist in reading X-rays) may read your X-rays. Make sure you know how you are to obtain your X-ray results. Do not assume everything is normal if you do   not hear from us. SEEK MEDICAL CARE IF: Bruising, swelling, or pain increases. SEEK IMMEDIATE MEDICAL CARE IF:   Your fingers or toes are numb or blue.   The pain is not responding to medications and continues to stay the same or get worse.   The pain in your joint becomes severe.   You develop a fever over 102 F (38.9 C).   It becomes impossible to move or use the joint.  MAKE SURE YOU:   Understand these instructions.   Will watch your condition.   Will get help right away if you are not doing well or get worse.  Document Released: 10/26/2005 Document Revised: 10/15/2011 Document Reviewed: 06/13/2008 Exit[employer] Patient Information 2012 [employer]. 

## 2012-02-16 NOTE — ED Notes (Signed)
Pt presents to department for evaluation of all over body aches. Ongoing x2 weeks. Pt states severe soreness and pain to both shoulders, neck and back. Denies recent injury. Able to move all extremities without difficulty. Pt is alert and oriented x4. 9/10 pain at the time.

## 2012-02-16 NOTE — ED Provider Notes (Signed)
History    71 year old female with generalized body aches. Patient is history rheumatoid arthritis says that the pain is in her usual distributions worse than it typically is. Patient had previously been on chronic prednisone for a period of roughly 8 years. She said she was stopped about 6 weeks ago. Pain has been increasingly worse the last couple weeks. Pain in bilateral hands, both shoulders, right worse than left, back and neck. Denies any trauma. No fevers or chills. Mild swelling of bilateral hands unusual for her. Patient has been taking ibuprofen with little relief. Patient is on methotrexate and Plaquenil.  CSN: [id number]  Arrival date & time 02/16/12  1146   First MD Initiated Contact with Patient 02/16/12 1251      Chief Complaint  Patient presents with  . Generalized Body Aches    "Hurt all over."    (Consider location/radiation/quality/duration/timing/severity/associated sxs/prior treatment) HPI  Past Medical History  Diagnosis Date  . Allergy   . Anemia     iron deficiency  . Depression   . Hyperlipidemia   . Hypertension   . Urinary incontinence   . Hep C w/o coma, chronic   . Arthritis     rheumatoid    Past Surgical History  Procedure Date  . Abdominal hysterectomy     Family History  Problem Relation Age of Onset  . Hypertension Father   . Heart disease Sister 10    CABG x 3  . Cirrhosis Brother   . Alcohol abuse Brother   . Colitis Daughter   . Hypertension Son   . Kidney disease Son     transplant due to kidney problems after [facility]  . Arthritis Other   . Coronary artery disease Other   . Hyperlipidemia Other   . Hypertension Other   . Stroke Other   . Arthritis Sister   . Hypertension Sister   . Thyroid disease Daughter   . Thyroid disease Daughter   . Thyroid disease Daughter     History  Substance Use Topics  . Smoking status: Current Everyday Smoker -- 32 years    Types: Cigarettes  . Smokeless tobacco: Never Used   Comment: 6-7 cigarettes a day.  . Alcohol Use: No    OB History    Grav Para Term Preterm Abortions TAB SAB Ect Mult Living                  Review of Systems   Review of symptoms negative unless otherwise noted in HPI.   Allergies  Review of patient's allergies indicates no known allergies.  Home Medications   Current Outpatient Rx  Name Route Sig Dispense Refill  . CALCIUM CARBONATE-VITAMIN D 600-400 MG-UNIT PO TABS Oral Take 1 tablet by mouth 2 (two) times daily.      Marland Kitchen FOLIC ACID 1 MG PO TABS Oral Take 1 mg by mouth 2 (two) times daily.     Marland Kitchen HYDROXYCHLOROQUINE SULFATE 200 MG PO TABS Oral Take 200 mg by mouth 2 (two) times daily.      . IBUPROFEN 800 MG PO TABS Oral Take 800 mg by mouth every 8 (eight) hours as needed. For pain    . LORATADINE 10 MG PO TABS Oral Take 10 mg by mouth daily as needed. For allergies     . METHOTREXATE CHEMO INJECTION (PF) 1 GM **50 MG/ML** Intramuscular Inject 0.8 mg into the muscle once a week. On Sunday    . METOPROLOL SUCCINATE ER 50 MG PO  TB24 Oral Take 50 mg by mouth daily. Take with or immediately following a meal.    . PSYLLIUM 58.6 % PO PACK Oral Take 1 packet by mouth daily as needed. For constipation    . VITAMIN B-12 100 MCG PO TABS Oral Take 100 mcg by mouth daily.        BP 135/66  Pulse 83  Temp(Src) 98.3 F (36.8 C) (Oral)  Resp 20  SpO2 100%  Physical Exam  Nursing note and vitals reviewed. Constitutional: She is oriented to person, place, and time. She appears well-developed and well-nourished. No distress.  HENT:  Head: Normocephalic and atraumatic.  Right Ear: External ear normal.  Mouth/Throat: Oropharynx is clear and moist.  Eyes: Conjunctivae are normal. Right eye exhibits no discharge. Left eye exhibits no discharge.  Cardiovascular: Normal rate, regular rhythm and normal heart sounds.  Exam reveals no gallop and no friction rub.   No murmur heard. Pulmonary/Chest: Effort normal and breath sounds normal. No  respiratory distress.  Abdominal: Soft. She exhibits no distension. There is no tenderness.  Musculoskeletal: She exhibits edema and tenderness.       Mild diffuse edema bilateral hands. No overlying skin changes. Tenderness with range of motion of her wrist, bilateral shoulders and neck. No midline spinal.  Neurological: She is alert and oriented to person, place, and time. No cranial nerve deficit. She exhibits normal muscle tone.  Skin: Skin is warm and dry.  Psychiatric: She has a normal mood and affect. Her behavior is normal. Thought content normal.    ED Course  Procedures (including critical care time)  Labs Reviewed - No data to display No results found.   1. Arthralgia       MDM  71 year old female with arthralgias in multiple joints. Suspect related to rheumatoid arthritis. No evidence of septic joint on exam. Patient is mildly uncomfortable appearing but not toxic. There is no history of trauma. We'll give a short course of steroids given the apparent severity of patient's current symptoms.  prescription for Percocet as well. Patient states that she has a followup appointment with her rheumatologist next month. Understands return precautions sooner.        Raeford Razor, MD 02/16/12 [phone]

## 2012-02-16 NOTE — ED Notes (Signed)
Pt medicated, will monitor before discharged.

## 2012-02-16 NOTE — ED Notes (Signed)
Pt discharged home. Wheelchair to discharge. Had no further questions. Instructed to follow up with PCP.

## 2012-02-16 NOTE — ED Notes (Signed)
Pt reports pain all over x 3 weeks. Pt seen at PMD and told she had pneumonia. Pt c/o pain in neck increasing and headache.

## 2012-02-18 DIAGNOSIS — M069 Rheumatoid arthritis, unspecified: Secondary | ICD-10-CM | POA: Diagnosis not present

## 2012-02-18 DIAGNOSIS — Z79899 Other long term (current) drug therapy: Secondary | ICD-10-CM | POA: Diagnosis not present

## 2012-02-18 DIAGNOSIS — M25579 Pain in unspecified ankle and joints of unspecified foot: Secondary | ICD-10-CM | POA: Diagnosis not present

## 2012-02-22 ENCOUNTER — Telehealth: Payer: Self-pay | Admitting: Family

## 2012-02-22 MED ORDER — METOPROLOL SUCCINATE ER 50 MG PO TB24: 50.0000 mg | ORAL_TABLET | Freq: Every day | ORAL | Status: DC

## 2012-02-22 NOTE — Telephone Encounter (Signed)
Metoprolol refill sent to pharmacy

## 2012-02-22 NOTE — Telephone Encounter (Signed)
Refill- metoprolol succ er 50mg  tab. Take one tablet by mouth every day. Qty 30 last fill 3.16.13

## 2012-04-15 ENCOUNTER — Ambulatory Visit (INDEPENDENT_AMBULATORY_CARE_PROVIDER_SITE_OTHER): Payer: Medicare Other | Admitting: Family

## 2012-04-15 ENCOUNTER — Encounter: Payer: Self-pay | Admitting: Family

## 2012-04-15 VITALS — BP 130/80 | HR 60 | Temp 97.6°F | Resp 14 | Ht 66.5 in | Wt 135.0 lb

## 2012-04-15 DIAGNOSIS — M069 Rheumatoid arthritis, unspecified: Secondary | ICD-10-CM | POA: Diagnosis not present

## 2012-04-15 DIAGNOSIS — E0590 Thyrotoxicosis, unspecified without thyrotoxic crisis or storm: Secondary | ICD-10-CM | POA: Diagnosis not present

## 2012-04-15 DIAGNOSIS — I10 Essential (primary) hypertension: Secondary | ICD-10-CM

## 2012-04-15 LAB — BASIC METABOLIC PANEL
BUN: 19 mg/dL (ref 6–23)
CO2: 27 mEq/L (ref 19–32)
Calcium: 9.3 mg/dL (ref 8.4–10.5)
Chloride: 106 mEq/L (ref 96–112)
Creat: 0.72 mg/dL (ref 0.50–1.10)
Glucose, Bld: 79 mg/dL (ref 70–99)
Potassium: 4.1 mEq/L (ref 3.5–5.3)
Sodium: 141 mEq/L (ref 135–145)

## 2012-04-15 NOTE — Patient Instructions (Signed)
Please follow up in 3 months. Complete your lab work prior to leaving. 

## 2012-04-15 NOTE — Assessment & Plan Note (Signed)
Management per endocrinology.

## 2012-04-15 NOTE — Assessment & Plan Note (Addendum)
BP Readings from Last 3 Encounters:  04/15/12 130/80  02/16/12 135/66  01/11/12 126/60   BP stable, continue current meds. Due for BMET.

## 2012-04-15 NOTE — Progress Notes (Signed)
Subjective:    [name], female    DOB: [birth year], 71 y.o.   MRN: [id number]  HPI  Ms.  Nancy [name] is a 71 yr old female who presents today for follow up.  HTN-  She denies chest pain or shortness of breath.    RA- reports flare, saw Dr. Corliss [name] and was placed back on pred.  She will establish with Dr. Cardell [name] later this month.  She is continued on plaquenil and methotrexate injections. Some swelling of left ankle. She attributes this to her RA.  Hyperthyroid- she is following with Dr. Welford [name] at [facility].   Review of Systems see HPI    Past Medical History  Diagnosis Date  . Allergy   . Anemia     iron deficiency  . Depression   . Hyperlipidemia   . Hypertension   . Urinary incontinence   . Hep C w/o coma, chronic   . Arthritis     rheumatoid    History   Social History  . Marital Status: Divorced    Spouse Name: N/A    Number of Children: 8  . Years of Education: N/A   Occupational History  .      Retired from [employer]   Social History Main Topics  . Smoking status: Current Everyday Smoker -- 32 years    Types: Cigarettes  . Smokeless tobacco: Never Used   Comment: 6-7 cigarettes a day.  . Alcohol Use: No  . Drug Use: No  . Sexually Active: Not on file   Other Topics Concern  . Not on file   Social History Narrative  . No narrative on file    Past Surgical History  Procedure Date  . Abdominal hysterectomy     Family History  Problem Relation Age of Onset  . Hypertension Father   . Heart disease Sister 56    CABG x 3  . Cirrhosis Brother   . Alcohol abuse Brother   . Colitis Daughter   . Hypertension Son   . Kidney disease Son     transplant due to kidney problems after [name]  . Arthritis Other   . Coronary artery disease Other   . Hyperlipidemia Other   . Hypertension Other   . Stroke Other   . Arthritis Sister   . Hypertension Sister   . Thyroid disease Daughter   . Thyroid disease Daughter   . Thyroid  disease Daughter     No Known Allergies  Current Outpatient Prescriptions on File Prior to Visit  Medication Sig Dispense Refill  . Calcium Carbonate-Vitamin D (CALTRATE 600+D) 600-400 MG-UNIT per tablet Take 1 tablet by mouth 2 (two) times daily.        . folic acid (FOLVITE) 1 MG tablet Take 1 mg by mouth 2 (two) times daily.       . hydroxychloroquine (PLAQUENIL) 200 MG tablet Take 200 mg by mouth 2 (two) times daily.        Marland Kitchen ibuprofen (ADVIL,MOTRIN) 800 MG tablet Take 800 mg by mouth every 8 (eight) hours as needed. For pain      . loratadine (CLARITIN) 10 MG tablet Take 10 mg by mouth daily as needed. For allergies       . methotrexate 1 G injection Inject 0.8 mg into the muscle once a week. On Sunday      . metoprolol succinate (TOPROL-XL) 50 MG 24 hr tablet Take 1 tablet (50 mg total) by mouth daily. Take with  or immediately following a meal.  30 tablet  2  . psyllium (METAMUCIL) 58.6 % packet Take 1 packet by mouth daily as needed. For constipation      . vitamin B-12 (CYANOCOBALAMIN) 100 MCG tablet Take 100 mcg by mouth daily.          BP 130/80  Pulse 60  Temp(Src) 97.6 F (36.4 C) (Oral)  Resp 14  Ht 5' 6.5" (1.689 m)  Wt 135 lb (61.236 kg)  BMI 21.46 kg/m2  SpO2 99%    Objective:   Physical Exam  Constitutional: She appears well-developed and well-nourished. No distress.  Cardiovascular: Normal rate and regular rhythm.   No murmur heard. Pulmonary/Chest: Effort normal and breath sounds normal. No respiratory distress. She has no wheezes. She has no rales. She exhibits no tenderness.  Musculoskeletal:       Slight swelling of left lateral ankle.   Psychiatric: She has a normal mood and affect. Her behavior is normal. Judgment and thought content normal.          Assessment & Plan:

## 2012-04-15 NOTE — Assessment & Plan Note (Addendum)
Recent flare, but now back at baseline. Defer management to rheumatology.

## 2012-04-18 ENCOUNTER — Encounter: Payer: Self-pay | Admitting: Family

## 2012-04-21 DIAGNOSIS — M069 Rheumatoid arthritis, unspecified: Secondary | ICD-10-CM | POA: Diagnosis not present

## 2012-05-02 ENCOUNTER — Telehealth: Payer: Self-pay | Admitting: Family

## 2012-05-02 MED ORDER — METOPROLOL SUCCINATE ER 50 MG PO TB24: 50.0000 mg | ORAL_TABLET | Freq: Every day | ORAL | Status: DC

## 2012-05-02 NOTE — Telephone Encounter (Signed)
90 days request  Metoprolol succ er 50mg  tab.

## 2012-05-02 NOTE — Telephone Encounter (Signed)
Refill sent to pharmacy #[id number] x no refills.

## 2012-05-05 DIAGNOSIS — R946 Abnormal results of thyroid function studies: Secondary | ICD-10-CM | POA: Diagnosis not present

## 2012-06-02 DIAGNOSIS — M069 Rheumatoid arthritis, unspecified: Secondary | ICD-10-CM | POA: Diagnosis not present

## 2012-06-07 DIAGNOSIS — M069 Rheumatoid arthritis, unspecified: Secondary | ICD-10-CM | POA: Diagnosis not present

## 2012-07-06 DIAGNOSIS — M069 Rheumatoid arthritis, unspecified: Secondary | ICD-10-CM | POA: Diagnosis not present

## 2012-07-12 ENCOUNTER — Ambulatory Visit (INDEPENDENT_AMBULATORY_CARE_PROVIDER_SITE_OTHER): Payer: Medicare Other | Admitting: Family

## 2012-07-12 ENCOUNTER — Encounter: Payer: Self-pay | Admitting: Family

## 2012-07-12 VITALS — BP 116/60 | HR 61 | Temp 97.6°F | Resp 16 | Ht 66.5 in | Wt 129.0 lb

## 2012-07-12 DIAGNOSIS — M069 Rheumatoid arthritis, unspecified: Secondary | ICD-10-CM | POA: Diagnosis not present

## 2012-07-12 DIAGNOSIS — I10 Essential (primary) hypertension: Secondary | ICD-10-CM

## 2012-07-12 DIAGNOSIS — E0590 Thyrotoxicosis, unspecified without thyrotoxic crisis or storm: Secondary | ICD-10-CM | POA: Diagnosis not present

## 2012-07-12 MED ORDER — METOPROLOL SUCCINATE ER 50 MG PO TB24: 50.0000 mg | ORAL_TABLET | Freq: Every day | ORAL | Status: DC

## 2012-07-12 NOTE — Assessment & Plan Note (Signed)
On orencia/methotrexate.   Hopefully, she is going to have good results with the Orencia.  Defer management to Dr. Cardell Peach.

## 2012-07-12 NOTE — Patient Instructions (Addendum)
Please follow up in 3 months.  Call for flu shot.

## 2012-07-12 NOTE — Assessment & Plan Note (Signed)
I think that her weakness and fatigue are multifactorial in setting of hyperthyroid and RA.  I have encouraged her to keep her upcoming apt with endocrinology and make sure that she is eating healthy well balanced meals to avoid further weight loss.

## 2012-07-12 NOTE — Progress Notes (Signed)
Subjective:    [name], female    DOB: 10-13-1941, 71 y.o.   MRN: [id number]  HPI  [name] is a 71 yr old female who presents today for follow up.  1) HTN-  Pt continues on metoprolol.  2) RA- Has now established with Dr. Cardell [name].    Pt reports that she was weaned off of prednisone.   She is no longer taking  plaquenil, but instead is on Orencia.  She started Orencia last week. Reports that pain and stiffness has been worse since she has been off of prednisone.   3) Hyper thyroid- following with Dr. Welford [name] at [facility].  She reports that she has follow up next month.    Reports that Dr Nancy [name] is monitoring her TSH only at this point.  Pt report + fatigue, occasional weakness and weight loss. Doesn't think that she has been eating well.   Review of Systems See HPI  Past Medical History  Diagnosis Date  . Allergy   . Anemia     iron deficiency  . Depression   . Hyperlipidemia   . Hypertension   . Urinary incontinence   . Arthritis     rheumatoid    History   Social History  . Marital Status: Divorced    Spouse Name: N/A    Number of Children: 8  . Years of Education: N/A   Occupational History  .      Retired from [employer]   Social History Main Topics  . Smoking status: Current Everyday Smoker -- 32 years    Types: Cigarettes  . Smokeless tobacco: Never Used   Comment: 6-7 cigarettes a day.  . Alcohol Use: No  . Drug Use: No  . Sexually Active: Not on file   Other Topics Concern  . Not on file   Social History Narrative  . No narrative on file    Past Surgical History  Procedure Date  . Abdominal hysterectomy     Family History  Problem Relation Age of Onset  . Hypertension Father   . Heart disease Sister 58    CABG x 3  . Cirrhosis Brother   . Alcohol abuse Brother   . Colitis Daughter   . Hypertension Son   . Kidney disease Son     transplant due to kidney problems after [facility]  . Arthritis Other   .  Coronary artery disease Other   . Hyperlipidemia Other   . Hypertension Other   . Stroke Other   . Arthritis Sister   . Hypertension Sister   . Thyroid disease Daughter   . Thyroid disease Daughter   . Thyroid disease Daughter     No Known Allergies  Current Outpatient Prescriptions on File Prior to Visit  Medication Sig Dispense Refill  . Abatacept (ORENCIA IV) Inject into the vein every 14 (fourteen) days.      . Calcium Carbonate-Vitamin D (CALTRATE 600+D) 600-400 MG-UNIT per tablet Take 1 tablet by mouth 2 (two) times daily.        . folic acid (FOLVITE) 1 MG tablet Take 1 mg by mouth 2 (two) times daily.       Marland Kitchen ibuprofen (ADVIL,MOTRIN) 800 MG tablet Take 800 mg by mouth every 8 (eight) hours as needed. For pain      . loratadine (CLARITIN) 10 MG tablet Take 10 mg by mouth daily as needed. For allergies       . methotrexate 1  G injection Inject 0.8 mg into the muscle once a week. On Sunday      . psyllium (METAMUCIL) 58.6 % packet Take 1 packet by mouth daily as needed. For constipation      . vitamin B-12 (CYANOCOBALAMIN) 100 MCG tablet Take 100 mcg by mouth daily.        Marland Kitchen DISCONTD: metoprolol succinate (TOPROL-XL) 50 MG 24 hr tablet Take 1 tablet (50 mg total) by mouth daily. Take with or immediately following a meal.  90 tablet  0    BP 116/60  Pulse 61  Temp 97.6 F (36.4 C) (Oral)  Resp 16  Ht 5' 6.5" (1.689 m)  Wt 129 lb (58.514 kg)  BMI 20.51 kg/m2  SpO2 98%        Objective:   Physical Exam  Constitutional: She appears well-developed and well-nourished. No distress.  Cardiovascular: Normal rate and regular rhythm.   No murmur heard. Pulmonary/Chest: Effort normal and breath sounds normal. No respiratory distress. She has no wheezes. She has no rales. She exhibits no tenderness.  Abdominal: Soft. Bowel sounds are normal. She exhibits no distension and no mass. There is no tenderness. There is no rebound and no guarding.  Psychiatric: She has a normal mood  and affect. Her behavior is normal. Judgment and thought content normal.          Assessment & Plan:

## 2012-07-12 NOTE — Assessment & Plan Note (Signed)
Well controlled on Toprol xl, continue same.

## 2012-07-20 DIAGNOSIS — M069 Rheumatoid arthritis, unspecified: Secondary | ICD-10-CM | POA: Diagnosis not present

## 2012-08-03 DIAGNOSIS — M069 Rheumatoid arthritis, unspecified: Secondary | ICD-10-CM | POA: Diagnosis not present

## 2012-08-09 DIAGNOSIS — I10 Essential (primary) hypertension: Secondary | ICD-10-CM | POA: Diagnosis not present

## 2012-08-09 DIAGNOSIS — R079 Chest pain, unspecified: Secondary | ICD-10-CM | POA: Diagnosis not present

## 2012-08-09 DIAGNOSIS — D649 Anemia, unspecified: Secondary | ICD-10-CM | POA: Diagnosis not present

## 2012-08-09 DIAGNOSIS — R0602 Shortness of breath: Secondary | ICD-10-CM | POA: Diagnosis not present

## 2012-08-09 DIAGNOSIS — R110 Nausea: Secondary | ICD-10-CM | POA: Diagnosis not present

## 2012-08-09 DIAGNOSIS — M069 Rheumatoid arthritis, unspecified: Secondary | ICD-10-CM | POA: Diagnosis not present

## 2012-08-09 DIAGNOSIS — E039 Hypothyroidism, unspecified: Secondary | ICD-10-CM | POA: Diagnosis not present

## 2012-08-09 DIAGNOSIS — Z79899 Other long term (current) drug therapy: Secondary | ICD-10-CM | POA: Diagnosis not present

## 2012-08-09 DIAGNOSIS — R0789 Other chest pain: Secondary | ICD-10-CM | POA: Diagnosis not present

## 2012-08-11 DIAGNOSIS — R079 Chest pain, unspecified: Secondary | ICD-10-CM | POA: Diagnosis not present

## 2012-08-11 DIAGNOSIS — R0789 Other chest pain: Secondary | ICD-10-CM | POA: Diagnosis not present

## 2012-08-17 DIAGNOSIS — Z780 Asymptomatic menopausal state: Secondary | ICD-10-CM | POA: Diagnosis not present

## 2012-08-17 DIAGNOSIS — M810 Age-related osteoporosis without current pathological fracture: Secondary | ICD-10-CM | POA: Diagnosis not present

## 2012-08-18 ENCOUNTER — Other Ambulatory Visit: Payer: Self-pay | Admitting: Family

## 2012-08-18 DIAGNOSIS — Z1231 Encounter for screening mammogram for malignant neoplasm of breast: Secondary | ICD-10-CM

## 2012-08-23 DIAGNOSIS — M810 Age-related osteoporosis without current pathological fracture: Secondary | ICD-10-CM | POA: Diagnosis not present

## 2012-08-23 DIAGNOSIS — R946 Abnormal results of thyroid function studies: Secondary | ICD-10-CM | POA: Diagnosis not present

## 2012-09-05 DIAGNOSIS — M069 Rheumatoid arthritis, unspecified: Secondary | ICD-10-CM | POA: Diagnosis not present

## 2012-09-14 ENCOUNTER — Telehealth: Payer: Self-pay | Admitting: Family

## 2012-09-14 ENCOUNTER — Ambulatory Visit
Admission: RE | Admit: 2012-09-14 | Discharge: 2012-09-14 | Disposition: A | Discharge status: Home / Self Care | Payer: Medicare Other | Source: Ambulatory Visit | Attending: Family | Admitting: Family

## 2012-09-14 DIAGNOSIS — Z1231 Encounter for screening mammogram for malignant neoplasm of breast: Secondary | ICD-10-CM | POA: Diagnosis not present

## 2012-09-14 NOTE — Telephone Encounter (Signed)
Called pt. Reviewed abnormal mammogram results and need to complete additional studies.  Pt instructed to contact us if she has not heard back from the breast center in 1 week re: scheduling of additional imaging. Pt verbalizes understanding.

## 2012-09-15 ENCOUNTER — Other Ambulatory Visit: Payer: Self-pay | Admitting: Family

## 2012-09-15 DIAGNOSIS — R928 Other abnormal and inconclusive findings on diagnostic imaging of breast: Secondary | ICD-10-CM

## 2012-09-26 ENCOUNTER — Ambulatory Visit
Admission: RE | Admit: 2012-09-26 | Discharge: 2012-09-26 | Disposition: A | Discharge status: Home / Self Care | Payer: Medicare Other | Source: Ambulatory Visit | Attending: Family | Admitting: Family

## 2012-09-26 DIAGNOSIS — R928 Other abnormal and inconclusive findings on diagnostic imaging of breast: Secondary | ICD-10-CM

## 2012-10-03 DIAGNOSIS — M810 Age-related osteoporosis without current pathological fracture: Secondary | ICD-10-CM | POA: Diagnosis not present

## 2012-10-03 DIAGNOSIS — M069 Rheumatoid arthritis, unspecified: Secondary | ICD-10-CM | POA: Diagnosis not present

## 2012-10-12 ENCOUNTER — Encounter: Payer: Self-pay | Admitting: Family

## 2012-10-12 ENCOUNTER — Ambulatory Visit (INDEPENDENT_AMBULATORY_CARE_PROVIDER_SITE_OTHER): Payer: Medicare Other | Admitting: Family

## 2012-10-12 VITALS — BP 110/80 | HR 64 | Temp 98.0°F | Resp 16 | Ht 66.5 in | Wt 135.1 lb

## 2012-10-12 DIAGNOSIS — F17200 Nicotine dependence, unspecified, uncomplicated: Secondary | ICD-10-CM

## 2012-10-12 DIAGNOSIS — E0590 Thyrotoxicosis, unspecified without thyrotoxic crisis or storm: Secondary | ICD-10-CM | POA: Diagnosis not present

## 2012-10-12 DIAGNOSIS — M069 Rheumatoid arthritis, unspecified: Secondary | ICD-10-CM | POA: Diagnosis not present

## 2012-10-12 DIAGNOSIS — I10 Essential (primary) hypertension: Secondary | ICD-10-CM | POA: Diagnosis not present

## 2012-10-12 DIAGNOSIS — F329 Major depressive disorder, single episode, unspecified: Secondary | ICD-10-CM

## 2012-10-12 LAB — BASIC METABOLIC PANEL
BUN: 11 mg/dL (ref 6–23)
CO2: 27 mEq/L (ref 19–32)
Calcium: 9 mg/dL (ref 8.4–10.5)
Chloride: 109 mEq/L (ref 96–112)
Creat: 0.64 mg/dL (ref 0.50–1.10)
Glucose, Bld: 80 mg/dL (ref 70–99)
Potassium: 4.8 mEq/L (ref 3.5–5.3)
Sodium: 144 mEq/L (ref 135–145)

## 2012-10-12 MED ORDER — BUPROPION HCL ER (SR) 150 MG PO TB12: 150.0000 mg | ORAL_TABLET | Freq: Two times a day (BID) | ORAL | Status: DC

## 2012-10-12 NOTE — Progress Notes (Signed)
Subjective:    [name], female    DOB: 1941-01-14, 71 y.o.   MRN: [id number]  HPI  [name] is a 71 yr old female who presents today for follow up.  1) HTN-  She is maintained on metoprolol.  2) RA- following with Dr. Cardell Peach.  She is back on prednisone 5mg .  Reports that she is feeling better.  Joint pain and fatigue are improved. She had flu shot at rheumatology office.    3) Depression-Reports good mood at this point.  Anxiety is wel controlled.    4) Hyperthyroid- she is followed by Endo and reports that her last thyroid test was "normal."  5) tobacco- Has tried patches/gum, electronic cigarettes.  She reports that she quit for 6-7 months back in the 1980's.  She is smoking 6-7 cigarettes a day.    Review of Systems    see HPI  Past Medical History  Diagnosis Date  . Allergy   . Anemia     iron deficiency  . Depression   . Hyperlipidemia   . Hypertension   . Urinary incontinence   . Arthritis     rheumatoid    History   Social History  . Marital Status: Divorced    Spouse Name: N/A    Number of Children: 8  . Years of Education: N/A   Occupational History  .      Retired from [employer]   Social History Main Topics  . Smoking status: Current Every Day Smoker -- 32 years    Types: Cigarettes  . Smokeless tobacco: Never Used     Comment: 6-7 cigarettes a day.  . Alcohol Use: No  . Drug Use: No  . Sexually Active: Not on file   Other Topics Concern  . Not on file   Social History Narrative  . No narrative on file    Past Surgical History  Procedure Date  . Abdominal hysterectomy     Family History  Problem Relation Age of Onset  . Hypertension Father   . Heart disease Sister 81    CABG x 3  . Cirrhosis Brother   . Alcohol abuse Brother   . Colitis Daughter   . Hypertension Son   . Kidney disease Son     transplant due to kidney problems after [facility]  . Arthritis Other   . Coronary artery disease Other   .  Hyperlipidemia Other   . Hypertension Other   . Stroke Other   . Arthritis Sister   . Hypertension Sister   . Thyroid disease Daughter   . Thyroid disease Daughter   . Thyroid disease Daughter     No Known Allergies  Current Outpatient Prescriptions on File Prior to Visit  Medication Sig Dispense Refill  . Abatacept (ORENCIA IV) Inject into the vein every 14 (fourteen) days.      . Calcium Carbonate-Vitamin D (CALTRATE 600+D) 600-400 MG-UNIT per tablet Take 1 tablet by mouth 2 (two) times daily.        . folic acid (FOLVITE) 1 MG tablet Take 1 mg by mouth 2 (two) times daily.       Marland Kitchen ibuprofen (ADVIL,MOTRIN) 800 MG tablet Take 800 mg by mouth every 8 (eight) hours as needed. For pain      . loratadine (CLARITIN) 10 MG tablet Take 10 mg by mouth daily as needed. For allergies       . methotrexate 1 G injection Inject 0.8 mg into  the muscle once a week. On Sunday      . metoprolol succinate (TOPROL-XL) 50 MG 24 hr tablet Take 1 tablet (50 mg total) by mouth daily. Take with or immediately following a meal.  90 tablet  0  . psyllium (METAMUCIL) 58.6 % packet Take 1 packet by mouth daily as needed. For constipation      . vitamin B-12 (CYANOCOBALAMIN) 100 MCG tablet Take 100 mcg by mouth daily.          BP 110/80  Pulse 64  Temp 98 F (36.7 C) (Oral)  Resp 16  Ht 5' 6.5" (1.689 m)  Wt 135 lb 1.3 oz (61.272 kg)  BMI 21.48 kg/m2  SpO2 99%    Objective:   Physical Exam  Constitutional: She appears well-developed and well-nourished. No distress.  HENT:  Head: Normocephalic and atraumatic.  Right Ear: Tympanic membrane and ear canal normal.       L TM- occluded by cerumen.  Cerumen removed with curette which revealed normal TM.  Cardiovascular: Normal rate and regular rhythm.   No murmur heard. Pulmonary/Chest: Effort normal and breath sounds normal. No respiratory distress. She has no wheezes. She has no rales. She exhibits no tenderness.  Musculoskeletal: She exhibits no  edema.  Psychiatric: She has a normal mood and affect. Her behavior is normal. Judgment and thought content normal.          Assessment & Plan:

## 2012-10-12 NOTE — Patient Instructions (Addendum)
Please complete your lab work prior to leaving.  Zyban- start 1 tablet by mouth daily for 3 days,then increase to twice daily on 4th day. Follow up in 6 weeks, sooner if problems/concerns.

## 2012-10-12 NOTE — Assessment & Plan Note (Addendum)
BP stable on toprol xl- continues same. Obtain bmet.

## 2012-10-12 NOTE — Assessment & Plan Note (Addendum)
>  5 minutes spent counseling pt on tobacco cessation.  She wishes to try zyban.  I also gave the the number for the smoking cessation program at [facility].

## 2012-10-12 NOTE — Assessment & Plan Note (Signed)
Improved on prednisone, orencia- managed by Rheumatology- Dr. Cardell Peach.

## 2012-10-12 NOTE — Assessment & Plan Note (Signed)
Clinically stable.  Managed by endo.  Monitor.

## 2012-10-12 NOTE — Assessment & Plan Note (Signed)
Stable.  Monitor.  

## 2012-11-04 DIAGNOSIS — M069 Rheumatoid arthritis, unspecified: Secondary | ICD-10-CM | POA: Diagnosis not present

## 2012-11-05 ENCOUNTER — Other Ambulatory Visit: Payer: Self-pay | Admitting: Family

## 2012-11-22 ENCOUNTER — Ambulatory Visit (INDEPENDENT_AMBULATORY_CARE_PROVIDER_SITE_OTHER): Payer: Medicare Other | Admitting: Family

## 2012-11-22 ENCOUNTER — Encounter: Payer: Self-pay | Admitting: Family

## 2012-11-22 VITALS — BP 130/80 | HR 62 | Temp 98.0°F | Resp 16 | Ht 66.5 in | Wt 137.0 lb

## 2012-11-22 DIAGNOSIS — F17200 Nicotine dependence, unspecified, uncomplicated: Secondary | ICD-10-CM | POA: Diagnosis not present

## 2012-11-22 NOTE — Assessment & Plan Note (Signed)
She has cut back and I commended her. Continue zyban.  Recommended 1 tablet of benadryl HS prn for sleep.  Plan to substitute nicorette gum for remaining 3 cigarettes.  Support provided.

## 2012-11-22 NOTE — Progress Notes (Signed)
Subjective:    [name], female    DOB: 1941-03-15, 72 y.o.   MRN: [id number]  HPI  Nancy [name] is a 72 yr old female who presents today for follow up.  1) Tobacco abuse- started zyban last visit. She has cut down from 6-8 cig a day to 3-4 cig a day.  Feels more gassy and notes that she can fall asleep but has trouble staying asleep.  Review of Systems See HPI  Past Medical History  Diagnosis Date  . Allergy   . Anemia     iron deficiency  . Depression   . Hyperlipidemia   . Hypertension   . Urinary incontinence   . Arthritis     rheumatoid    History   Social History  . Marital Status: Divorced    Spouse Name: N/A    Number of Children: 8  . Years of Education: N/A   Occupational History  .      Retired from [employer]   Social History Main Topics  . Smoking status: Current Every Day Smoker -- 32 years    Types: Cigarettes  . Smokeless tobacco: Never Used     Comment: 3-4 cigarettes a day.  . Alcohol Use: No  . Drug Use: No  . Sexually Active: Not on file   Other Topics Concern  . Not on file   Social History Narrative  . No narrative on file    Past Surgical History  Procedure Date  . Abdominal hysterectomy     Family History  Problem Relation Age of Onset  . Hypertension Father   . Heart disease Sister 2    CABG x 3  . Cirrhosis Brother   . Alcohol abuse Brother   . Colitis Daughter   . Hypertension Son   . Kidney disease Son     transplant due to kidney problems after [facility]  . Arthritis Other   . Coronary artery disease Other   . Hyperlipidemia Other   . Hypertension Other   . Stroke Other   . Arthritis Sister   . Hypertension Sister   . Thyroid disease Daughter   . Thyroid disease Daughter   . Thyroid disease Daughter     No Known Allergies  Current Outpatient Prescriptions on File Prior to Visit  Medication Sig Dispense Refill  . Abatacept (ORENCIA IV) Inject into the vein every 30 (thirty) days.        Marland Kitchen buPROPion (WELLBUTRIN SR) 150 MG 12 hr tablet Take 1 tablet (150 mg total) by mouth 2 (two) times daily.  60 tablet  2  . Calcium Carbonate-Vitamin D (CALTRATE 600+D) 600-400 MG-UNIT per tablet Take 1 tablet by mouth 2 (two) times daily.        . folic acid (FOLVITE) 1 MG tablet Take 1 mg by mouth 2 (two) times daily.       Marland Kitchen ibuprofen (ADVIL,MOTRIN) 800 MG tablet Take 800 mg by mouth every 8 (eight) hours as needed. For pain      . loratadine (CLARITIN) 10 MG tablet Take 10 mg by mouth daily as needed. For allergies       . methotrexate 1 G injection Inject 0.8 mg into the muscle once a week. On Sunday      . metoprolol succinate (TOPROL-XL) 50 MG 24 hr tablet TAKE 1 TABLET BY MOUTH EVERY DAY WITH OR IMMEDIATELY FOLLOWING A MEAL  90 tablet  1  . predniSONE (DELTASONE) 5 MG tablet  Take 5 mg by mouth daily.      . psyllium (METAMUCIL) 58.6 % packet Take 1 packet by mouth daily as needed. For constipation      . vitamin B-12 (CYANOCOBALAMIN) 100 MCG tablet Take 100 mcg by mouth daily.          BP 130/80  Pulse 62  Temp 98 F (36.7 C) (Oral)  Resp 16  Ht 5' 6.5" (1.689 m)  Wt 137 lb (62.143 kg)  BMI 21.78 kg/m2  SpO2 99%       Objective:   Physical Exam  Constitutional: She appears well-developed and well-nourished.  Psychiatric: She has a normal mood and affect. Her behavior is normal. Judgment and thought content normal.          Assessment & Plan:

## 2012-11-22 NOTE — Patient Instructions (Addendum)
Please follow up in 3 months.  

## 2012-12-05 DIAGNOSIS — M069 Rheumatoid arthritis, unspecified: Secondary | ICD-10-CM | POA: Diagnosis not present

## 2013-01-05 DIAGNOSIS — M069 Rheumatoid arthritis, unspecified: Secondary | ICD-10-CM | POA: Diagnosis not present

## 2013-01-05 DIAGNOSIS — M810 Age-related osteoporosis without current pathological fracture: Secondary | ICD-10-CM | POA: Diagnosis not present

## 2013-01-07 ENCOUNTER — Other Ambulatory Visit: Payer: Self-pay | Admitting: Family

## 2013-02-03 DIAGNOSIS — M069 Rheumatoid arthritis, unspecified: Secondary | ICD-10-CM | POA: Diagnosis not present

## 2013-02-20 ENCOUNTER — Encounter: Payer: Self-pay | Admitting: Family

## 2013-02-20 ENCOUNTER — Encounter: Payer: Self-pay | Admitting: Gastroenterology

## 2013-02-20 ENCOUNTER — Ambulatory Visit (INDEPENDENT_AMBULATORY_CARE_PROVIDER_SITE_OTHER): Payer: Medicare Other | Admitting: Family

## 2013-02-20 VITALS — BP 140/82 | HR 64 | Temp 98.2°F | Resp 16 | Ht 66.5 in | Wt 144.1 lb

## 2013-02-20 DIAGNOSIS — D649 Anemia, unspecified: Secondary | ICD-10-CM

## 2013-02-20 DIAGNOSIS — I10 Essential (primary) hypertension: Secondary | ICD-10-CM

## 2013-02-20 DIAGNOSIS — E0590 Thyrotoxicosis, unspecified without thyrotoxic crisis or storm: Secondary | ICD-10-CM | POA: Diagnosis not present

## 2013-02-20 DIAGNOSIS — F3289 Other specified depressive episodes: Secondary | ICD-10-CM

## 2013-02-20 DIAGNOSIS — Z860100 Personal history of colon polyps, unspecified: Secondary | ICD-10-CM

## 2013-02-20 DIAGNOSIS — F17200 Nicotine dependence, unspecified, uncomplicated: Secondary | ICD-10-CM | POA: Diagnosis not present

## 2013-02-20 DIAGNOSIS — F329 Major depressive disorder, single episode, unspecified: Secondary | ICD-10-CM

## 2013-02-20 LAB — CBC WITH DIFFERENTIAL/PLATELET
Basophils Absolute: 0 10*3/uL (ref 0.0–0.1)
Basophils Relative: 0 % (ref 0–1)
Eosinophils Absolute: 0.4 10*3/uL (ref 0.0–0.7)
Eosinophils Relative: 4 % (ref 0–5)
HCT: 34.1 % — ABNORMAL LOW (ref 36.0–46.0)
Hemoglobin: 11 g/dL — ABNORMAL LOW (ref 12.0–15.0)
Lymphocytes Relative: 36 % (ref 12–46)
Lymphs Abs: 3.2 10*3/uL (ref 0.7–4.0)
MCH: 25.8 pg — ABNORMAL LOW (ref 26.0–34.0)
MCHC: 32.3 g/dL (ref 30.0–36.0)
MCV: 79.9 fL (ref 78.0–100.0)
Monocytes Absolute: 0.6 10*3/uL (ref 0.1–1.0)
Monocytes Relative: 7 % (ref 3–12)
Neutro Abs: 4.8 10*3/uL (ref 1.7–7.7)
Neutrophils Relative %: 53 % (ref 43–77)
Platelets: 392 10*3/uL (ref 150–400)
RBC: 4.27 MIL/uL (ref 3.87–5.11)
RDW: 17.2 % — ABNORMAL HIGH (ref 11.5–15.5)
WBC: 8.9 10*3/uL (ref 4.0–10.5)

## 2013-02-20 LAB — BASIC METABOLIC PANEL
BUN: 16 mg/dL (ref 6–23)
CO2: 26 mEq/L (ref 19–32)
Calcium: 9.3 mg/dL (ref 8.4–10.5)
Chloride: 109 mEq/L (ref 96–112)
Creat: 0.74 mg/dL (ref 0.50–1.10)
Glucose, Bld: 89 mg/dL (ref 70–99)
Potassium: 4.5 mEq/L (ref 3.5–5.3)
Sodium: 145 mEq/L (ref 135–145)

## 2013-02-20 LAB — TSH: TSH: 0.701 u[IU]/mL (ref 0.350–4.500)

## 2013-02-20 LAB — T4, FREE: Free T4: 1.26 ng/dL (ref 0.80–1.80)

## 2013-02-20 LAB — T3, FREE: T3, Free: 2.6 pg/mL (ref 2.3–4.2)

## 2013-02-20 MED ORDER — VARENICLINE TARTRATE 0.5 MG X 11 & 1 MG X 42 PO MISC: ORAL | Status: DC

## 2013-02-20 NOTE — Assessment & Plan Note (Signed)
Obtain tfts

## 2013-02-20 NOTE — Progress Notes (Signed)
Subjective:    [name], female    DOB: [birth year], 72 y.o.   MRN: [id number]  HPI  [name] is a 72 yr old female who presents today for follow up.  1) HTN- she is currently maintained on toprol xl 50mg . She denies CP/SOB or swelling.   2) Depression- she is currently maintained on bupropion- primarily for help with smoking cessation.  Reports that she feels down sometimes because she feels bored now that she is retired.  3) Tobacco abuse- Using zyban.  Rports that she went 1.5 days without a cigarette but has trouble sleeping on zyban. hyroidism-    4) RA- follows with Nancy [name]- currently maintained on prednisone, methotrexate and orencia.    Review of Systems See HPI  Past Medical History  Diagnosis Date  . Allergy   . Anemia     iron deficiency  . Depression   . Hyperlipidemia   . Hypertension   . Urinary incontinence   . Arthritis     rheumatoid    History   Social History  . Marital Status: Divorced    Spouse Name: N/A    Number of Children: 8  . Years of Education: N/A   Occupational History  .      Retired from [employer]   Social History Main Topics  . Smoking status: Current Every Day Smoker -- 32 years    Types: Cigarettes  . Smokeless tobacco: Never Used     Comment: 3-6 cigarettes a day.  . Alcohol Use: No  . Drug Use: No  . Sexually Active: Not on file   Other Topics Concern  . Not on file   Social History Narrative  . No narrative on file    Past Surgical History  Procedure Laterality Date  . Abdominal hysterectomy      Family History  Problem Relation Age of Onset  . Hypertension Father   . Heart disease Sister 86    CABG x 3  . Cirrhosis Brother   . Alcohol abuse Brother   . Colitis Daughter   . Hypertension Son   . Kidney disease Son     transplant due to kidney problems after [facility]  . Arthritis Other   . Coronary artery disease Other   . Hyperlipidemia Other   . Hypertension Other   .  Stroke Other   . Arthritis Sister   . Hypertension Sister   . Thyroid disease Daughter   . Thyroid disease Daughter   . Thyroid disease Daughter     No Known Allergies  Current Outpatient Prescriptions on File Prior to Visit  Medication Sig Dispense Refill  . Abatacept (ORENCIA IV) Inject into the vein every 30 (thirty) days.       Marland Kitchen buPROPion (WELLBUTRIN SR) 150 MG 12 hr tablet TAKE 1 TABLET BY MOUTH TWICE A DAY  60 tablet  2  . Calcium Carbonate-Vitamin D (CALTRATE 600+D) 600-400 MG-UNIT per tablet Take 1 tablet by mouth 2 (two) times daily.        . folic acid (FOLVITE) 1 MG tablet Take 1 mg by mouth 2 (two) times daily.       Marland Kitchen ibuprofen (ADVIL,MOTRIN) 800 MG tablet Take 800 mg by mouth every 8 (eight) hours as needed. For pain      . loratadine (CLARITIN) 10 MG tablet Take 10 mg by mouth daily as needed. For allergies       . methotrexate 1 G injection  Inject 0.8 mg into the muscle once a week. On Sunday      . metoprolol succinate (TOPROL-XL) 50 MG 24 hr tablet TAKE 1 TABLET BY MOUTH EVERY DAY WITH OR IMMEDIATELY FOLLOWING A MEAL  90 tablet  1  . predniSONE (DELTASONE) 5 MG tablet Take 5 mg by mouth daily.      . psyllium (METAMUCIL) 58.6 % packet Take 1 packet by mouth daily as needed. For constipation      . vitamin B-12 (CYANOCOBALAMIN) 100 MCG tablet Take 100 mcg by mouth daily.         No current facility-administered medications on file prior to visit.    BP 140/82  Pulse 64  Temp(Src) 98.2 F (36.8 C) (Oral)  Resp 16  Ht 5' 6.5" (1.689 m)  Wt 144 lb 1.3 oz (65.354 kg)  BMI 22.91 kg/m2  SpO2 98%       Objective:   Physical Exam  Constitutional: She is oriented to person, place, and time. She appears well-developed and well-nourished. No distress.  HENT:  Head: Normocephalic and atraumatic.  Cardiovascular: Normal rate and regular rhythm.   No murmur heard. Pulmonary/Chest: Effort normal and breath sounds normal. No respiratory distress. She has no wheezes.  She has no rales. She exhibits no tenderness.  Lymphadenopathy:    She has no cervical adenopathy.  Neurological: She is alert and oriented to person, place, and time.  Skin: Skin is warm and dry.  Psychiatric: She has a normal mood and affect. Her behavior is normal. Judgment and thought content normal.          Assessment & Plan:

## 2013-02-20 NOTE — Assessment & Plan Note (Signed)
BP Readings from Last 3 Encounters:  02/20/13 140/82  11/22/12 130/80  10/12/12 110/80   BP control OK.  Continue current dose of toprol xl.

## 2013-02-20 NOTE — Assessment & Plan Note (Signed)
Stable. She is having trouble sleeping on the zyban. Taper off and see how she does.

## 2013-02-20 NOTE — Patient Instructions (Addendum)
Please complete lab work prior to leaving. Take wellbutrin once daily for 1 week then stop. Start chantix when you stop wellbutrin. Follow up in 6 weeks.

## 2013-02-20 NOTE — Assessment & Plan Note (Signed)
Will d/c zyban and give trial of chantix.  Trial of chantix. Common side effects including rare risk of suicide ideation was discussed with the patient today.   We discussed that patient can continue to smoke for 1 week after starting chantix, but then must discontinue cigarettes.  He is also instructed to contact us prior to completion of the starter month pack for an rx for the continuation month pack.  5 minutes spent with patient today on tobacco cessation counseling.

## 2013-02-21 ENCOUNTER — Encounter: Payer: Self-pay | Admitting: Family

## 2013-03-02 DIAGNOSIS — R946 Abnormal results of thyroid function studies: Secondary | ICD-10-CM | POA: Diagnosis not present

## 2013-03-06 DIAGNOSIS — M069 Rheumatoid arthritis, unspecified: Secondary | ICD-10-CM | POA: Diagnosis not present

## 2013-03-17 ENCOUNTER — Ambulatory Visit (AMBULATORY_SURGERY_CENTER): Payer: Medicare Other | Admitting: *Deleted

## 2013-03-17 VITALS — Ht 66.5 in | Wt 143.0 lb

## 2013-03-17 DIAGNOSIS — Z1211 Encounter for screening for malignant neoplasm of colon: Secondary | ICD-10-CM

## 2013-03-17 MED ORDER — SUPREP BOWEL PREP KIT 17.5-3.13-1.6 GM/177ML PO SOLN: ORAL | Status: DC

## 2013-03-31 ENCOUNTER — Encounter: Payer: Self-pay | Admitting: Gastroenterology

## 2013-03-31 ENCOUNTER — Ambulatory Visit (AMBULATORY_SURGERY_CENTER): Payer: Medicare Other | Admitting: Gastroenterology

## 2013-03-31 VITALS — BP 117/67 | HR 61 | Temp 96.2°F | Resp 27 | Ht 66.0 in | Wt 143.0 lb

## 2013-03-31 DIAGNOSIS — I10 Essential (primary) hypertension: Secondary | ICD-10-CM | POA: Diagnosis not present

## 2013-03-31 DIAGNOSIS — D126 Benign neoplasm of colon, unspecified: Secondary | ICD-10-CM

## 2013-03-31 DIAGNOSIS — Z1211 Encounter for screening for malignant neoplasm of colon: Secondary | ICD-10-CM

## 2013-03-31 DIAGNOSIS — K648 Other hemorrhoids: Secondary | ICD-10-CM

## 2013-03-31 MED ORDER — SODIUM CHLORIDE 0.9 % IV SOLN: 500.0000 mL | INTRAVENOUS | Status: DC

## 2013-03-31 NOTE — Progress Notes (Signed)
Called to room to assist during endoscopic procedure.  Patient ID and intended procedure confirmed with present staff. Received instructions for my participation in the procedure from the performing physician.  

## 2013-03-31 NOTE — Progress Notes (Addendum)
Patient did not have preoperative order for IV antibiotic SSI prophylaxis. (G8918)  Patient did not experience any of the following events: a burn prior to discharge; a fall within the facility; wrong site/side/patient/procedure/implant event; or a hospital transfer or hospital admission upon discharge from the facility. (G8907)  

## 2013-03-31 NOTE — Op Note (Signed)
[facility] [address] [place], [address]   COLONOSCOPY PROCEDURE REPORT  PATIENT: Nancy [name], Nancy [name]  MR#: [id number] BIRTHDATE: 1941/01/05 , 72  yrs. old GENDER: Female ENDOSCOPIST: Louis Meckel, MD REFERRED HQ:[id number] Peggyann Juba, FNP PROCEDURE DATE:  03/31/2013 PROCEDURE:   Colonoscopy with snare polypectomy ASA CLASS:   Class II INDICATIONS:average risk screening. MEDICATIONS: MAC sedation, administered by CRNA and propofol (Diprivan) 200mg  IV  DESCRIPTION OF PROCEDURE:   After the risks benefits and alternatives of the procedure were thoroughly explained, informed consent was obtained.  A digital rectal exam revealed no abnormalities of the rectum.   The LB [id number]  endoscope was introduced through the anus and advanced to the cecum, which was identified by both the appendix and ileocecal valve. No adverse events experienced.   The quality of the prep was Suprep good  The instrument was then slowly withdrawn as the colon was fully examined.      COLON FINDINGS: A sessile 3mm polyp was found in the ascending colon.  A polypectomy was performed with a cold snare.  The resection was complete and the polyp tissue was completely retrieved.   Internal hemorrhoids were found.   The colon mucosa was otherwise normal.  Retroflexed views revealed no abnormalities. The time to cecum=2 minutes 58 seconds.  Withdrawal time=8 minutes 01 seconds.  The scope was withdrawn and the procedure completed. COMPLICATIONS: There were no complications.  ENDOSCOPIC IMPRESSION: 1.   Sessile polyp was found in the ascending colon; polypectomy was performed with a cold snare 2.   Internal hemorrhoids 3.   The colon mucosa was otherwise normal  RECOMMENDATIONS: If the polyp(s) removed today are proven to be adenomatous (pre-cancerous) polyps, you will need a repeat colonoscopy in 5 years.  Otherwise you should continue to follow colorectal cancer screening  guidelines for "routine risk" patients with colonoscopy in 10 years.  You will receive a letter within 1-2 weeks with the results of your biopsy as well as final recommendations.  Please call my office if you have not received a letter after 3 weeks.   eSigned:  Louis Meckel, MD 03/31/2013 11:29 AM   cc:

## 2013-03-31 NOTE — Patient Instructions (Addendum)
YOU HAD AN ENDOSCOPIC PROCEDURE TODAY AT THE [facility]: Refer to the procedure report that was given to you for any specific questions about what was found during the examination.  If the procedure report does not answer your questions, please call your gastroenterologist to clarify.  If you requested that your care partner not be given the details of your procedure findings, then the procedure report has been included in a sealed envelope for you to review at your convenience later.  YOU SHOULD EXPECT: Some feelings of bloating in the abdomen. Passage of more gas than usual.  Walking can help get rid of the air that was put into your GI tract during the procedure and reduce the bloating. If you had a lower endoscopy (such as a colonoscopy or flexible sigmoidoscopy) you may notice spotting of blood in your stool or on the toilet paper. If you underwent a bowel prep for your procedure, then you may not have a normal bowel movement for a few days.  DIET: Your first meal following the procedure should be a light meal and then it is ok to progress to your normal diet.  A half-sandwich or bowl of soup is an example of a good first meal.  Heavy or fried foods are harder to digest and may make you feel nauseous or bloated.  Likewise meals heavy in dairy and vegetables can cause extra gas to form and this can also increase the bloating.  Drink plenty of fluids but you should avoid alcoholic beverages for 24 hours.  ACTIVITY: Your care partner should take you home directly after the procedure.  You should plan to take it easy, moving slowly for the rest of the day.  You can resume normal activity the day after the procedure however you should NOT DRIVE or use heavy machinery for 24 hours (because of the sedation medicines used during the test).    SYMPTOMS TO REPORT IMMEDIATELY: A gastroenterologist can be reached at any hour.  During normal business hours, 8:30 AM to 5:00 PM Monday through Friday,  call [phone].  After hours and on weekends, please call the GI answering service at [phone] who will take a message and have the physician on call contact you.   Following lower endoscopy (colonoscopy or flexible sigmoidoscopy):  Excessive amounts of blood in the stool  Significant tenderness or worsening of abdominal pains  Swelling of the abdomen that is new, acute  Fever of 100F or higher    FOLLOW UP: If any biopsies were taken you will be contacted by phone or by letter within the next 1-3 weeks.  Call your gastroenterologist if you have not heard about the biopsies in 3 weeks.  Our staff will call the home number listed on your records the next business day following your procedure to check on you and address any questions or concerns that you may have at that time regarding the information given to you following your procedure. This is a courtesy call and so if there is no answer at the home number and we have not heard from you through the emergency physician on call, we will assume that you have returned to your regular daily activities without incident.  SIGNATURES/CONFIDENTIALITY: You and/or your care partner have signed paperwork which will be entered into your electronic medical record.  These signatures attest to the fact that that the information above on your After Visit Summary has been reviewed and is understood.  Full responsibility of the confidentiality   of this discharge information lies with you and/or your care-partner.     

## 2013-04-04 ENCOUNTER — Ambulatory Visit: Payer: Medicare Other | Admitting: Family

## 2013-04-04 ENCOUNTER — Telehealth: Payer: Self-pay

## 2013-04-04 NOTE — Telephone Encounter (Signed)
  Follow up Call-  Call back number 03/31/2013  Post procedure Call Back phone  # [phone]  Permission to leave phone message Yes     Patient questions:  Do you have a fever, pain , or abdominal swelling? no Pain Score  0 *  Have you tolerated food without any problems? yes  Have you been able to return to your normal activities? yes  Do you have any questions about your discharge instructions: Diet   no Medications  no Follow up visit  no  Do you have questions or concerns about your Care? no  Actions: * If pain score is 4 or above: No action needed, pain <4.

## 2013-04-06 DIAGNOSIS — M069 Rheumatoid arthritis, unspecified: Secondary | ICD-10-CM | POA: Diagnosis not present

## 2013-04-12 ENCOUNTER — Encounter: Payer: Self-pay | Admitting: Gastroenterology

## 2013-05-05 ENCOUNTER — Other Ambulatory Visit: Payer: Self-pay | Admitting: *Deleted

## 2013-05-05 ENCOUNTER — Other Ambulatory Visit: Payer: Self-pay | Admitting: Family

## 2013-05-05 DIAGNOSIS — I10 Essential (primary) hypertension: Secondary | ICD-10-CM

## 2013-05-05 MED ORDER — METOPROLOL SUCCINATE ER 50 MG PO TB24: ORAL_TABLET | ORAL | Status: DC

## 2013-05-05 NOTE — Telephone Encounter (Signed)
Refill fr metoprolol sent to [facility] on [address], [place]

## 2013-05-17 DIAGNOSIS — R946 Abnormal results of thyroid function studies: Secondary | ICD-10-CM | POA: Diagnosis not present

## 2013-06-19 ENCOUNTER — Encounter: Payer: Self-pay | Admitting: Physician Assistant

## 2013-06-19 ENCOUNTER — Ambulatory Visit (INDEPENDENT_AMBULATORY_CARE_PROVIDER_SITE_OTHER): Payer: Medicare Other | Admitting: Physician Assistant

## 2013-06-19 VITALS — BP 126/70 | HR 64 | Temp 97.8°F | Resp 14 | Wt 142.2 lb

## 2013-06-19 DIAGNOSIS — R309 Painful micturition, unspecified: Secondary | ICD-10-CM

## 2013-06-19 DIAGNOSIS — R3915 Urgency of urination: Secondary | ICD-10-CM

## 2013-06-19 DIAGNOSIS — R350 Frequency of micturition: Secondary | ICD-10-CM | POA: Diagnosis not present

## 2013-06-19 DIAGNOSIS — R300 Dysuria: Secondary | ICD-10-CM

## 2013-06-19 DIAGNOSIS — N390 Urinary tract infection, site not specified: Secondary | ICD-10-CM | POA: Diagnosis not present

## 2013-06-19 LAB — POCT URINALYSIS DIPSTICK
Glucose, UA: NEGATIVE
Nitrite, UA: NEGATIVE
Spec Grav, UA: >=1.03
Urobilinogen, UA: 0.2
pH, UA: 6

## 2013-06-19 MED ORDER — NITROFURANTOIN MONOHYD MACRO 100 MG PO CAPS: 100.0000 mg | ORAL_CAPSULE | Freq: Two times a day (BID) | ORAL | Status: DC

## 2013-06-19 NOTE — Patient Instructions (Addendum)
Please take antibiotic as prescribed for UTI.  Drink plenty of water.  Avoid caffeine because it sometimes irritate the bladder which will worsen your UTI symptoms.  Try a probiotic daily to help populate your GI tract with good bacteria.  Urinary Tract Infection Urinary tract infections (UTIs) can develop anywhere along your urinary tract. Your urinary tract is your body's drainage system for removing wastes and extra water. Your urinary tract includes two kidneys, two ureters, a bladder, and a urethra. Your kidneys are a pair of bean-shaped organs. Each kidney is about the size of your fist. They are located below your ribs, one on each side of your spine. CAUSES Infections are caused by microbes, which are microscopic organisms, including fungi, viruses, and bacteria. These organisms are so small that they can only be seen through a microscope. Bacteria are the microbes that most commonly cause UTIs. SYMPTOMS  Symptoms of UTIs may vary by age and gender of the patient and by the location of the infection. Symptoms in young women typically include a frequent and intense urge to urinate and a painful, burning feeling in the bladder or urethra during urination. Older women and men are more likely to be tired, shaky, and weak and have muscle aches and abdominal pain. A fever may mean the infection is in your kidneys. Other symptoms of a kidney infection include pain in your back or sides below the ribs, nausea, and vomiting. DIAGNOSIS To diagnose a UTI, your caregiver will ask you about your symptoms. Your caregiver also will ask to provide a urine sample. The urine sample will be tested for bacteria and white blood cells. White blood cells are made by your body to help fight infection. TREATMENT  Typically, UTIs can be treated with medication. Because most UTIs are caused by a bacterial infection, they usually can be treated with the use of antibiotics. The choice of antibiotic and length of treatment  depend on your symptoms and the type of bacteria causing your infection. HOME CARE INSTRUCTIONS  If you were prescribed antibiotics, take them exactly as your caregiver instructs you. Finish the medication even if you feel better after you have only taken some of the medication.  Drink enough water and fluids to keep your urine clear or pale yellow.  Avoid caffeine, tea, and carbonated beverages. They tend to irritate your bladder.  Empty your bladder often. Avoid holding urine for long periods of time.  Empty your bladder before and after sexual intercourse.  After a bowel movement, women should cleanse from front to back. Use each tissue only once. SEEK MEDICAL CARE IF:   You have back pain.  You develop a fever.  Your symptoms do not begin to resolve within 3 days. SEEK IMMEDIATE MEDICAL CARE IF:   You have severe back pain or lower abdominal pain.  You develop chills.  You have nausea or vomiting.  You have continued burning or discomfort with urination. MAKE SURE YOU:   Understand these instructions.  Will watch your condition.  Will get help right away if you are not doing well or get worse. Document Released: 08/05/2005 Document Revised: 04/26/2012 Document Reviewed: 12/04/2011 [facility] Patient Information 2014 [place], [place].

## 2013-06-19 NOTE — Assessment & Plan Note (Signed)
Macrobid 100mg  BID x 5 days.  Please drink plenty of water.  Avoid caffeine intake as it irritates the bladder and may worsen UTI symptoms.  Can try cranberry supplement if you wish.  Encourage daily probiotics.  Patient educated on alarm symptoms.

## 2013-06-19 NOTE — Progress Notes (Signed)
[name], female   DOB: 06-21-1941, 72 y.o.   MRN: [id number]   Patient is a 72 year-old african [place] female who presents to clinic today c/o urinary urgency and frequency x 1 week.  Patient endorses dysuria, bladder "fullness", suprapubic pain. Denies hematuria. Denies fever, chills, flank pain, nausea, vomiting.  Denies significant history of UTI.  Has taken some AZO with minimal relief of symptoms.  Denies hx of kidney stone.  Otherwise been doing well.  Past Medical History  Diagnosis Date  . Allergy   . Anemia     iron deficiency  . Depression   . Hyperlipidemia   . Hypertension   . Urinary incontinence   . Arthritis     rheumatoid  . Cataract    Current Outpatient Prescriptions on File Prior to Visit  Medication Sig Dispense Refill  . Calcium Carbonate-Vitamin D (CALTRATE 600+D) 600-400 MG-UNIT per tablet Take 1 tablet by mouth 2 (two) times daily.        . folic acid (FOLVITE) 1 MG tablet Take 1 mg by mouth 2 (two) times daily.       Marland Kitchen ibuprofen (ADVIL,MOTRIN) 800 MG tablet Take 800 mg by mouth every 8 (eight) hours as needed. For pain      . loratadine (CLARITIN) 10 MG tablet Take 10 mg by mouth daily as needed. For allergies       . methotrexate 1 G injection Inject 0.8 mg into the muscle once a week. On Sunday      . metoprolol succinate (TOPROL-XL) 50 MG 24 hr tablet TAKE 1 TABLET BY MOUTH EVERY DAY WITH OR IMMEDIATELY FOLLOWING A MEAL  90 tablet  1  . predniSONE (DELTASONE) 5 MG tablet Take 5 mg by mouth daily.      . psyllium (METAMUCIL) 58.6 % packet Take 1 packet by mouth daily as needed. For constipation      . vitamin B-12 (CYANOCOBALAMIN) 100 MCG tablet Take 100 mcg by mouth daily.         No current facility-administered medications on file prior to visit.   No Known Allergies Family History  Problem Relation Age of Onset  . Hypertension Father   . Heart disease Sister 10    CABG x 3  . Cirrhosis Brother   . Alcohol abuse Brother   .  Colitis Daughter   . Colon polyps Daughter   . Hypertension Son   . Kidney disease Son     transplant due to kidney problems after [facility]  . Arthritis Other   . Coronary artery disease Other   . Hyperlipidemia Other   . Hypertension Other   . Stroke Other   . Arthritis Sister   . Hypertension Sister   . Thyroid disease Daughter   . Thyroid disease Daughter   . Thyroid disease Daughter    History   Social History  . Marital Status: Divorced    Spouse Name: N/A    Number of Children: 8  . Years of Education: N/A   Occupational History  .      Retired from [employer]   Social History Main Topics  . Smoking status: Current Every Day Smoker -- 32 years    Types: Cigarettes  . Smokeless tobacco: Never Used     Comment: 3-6 cigarettes a day.  . Alcohol Use: No  . Drug Use: No  . Sexually Active: None   Other Topics Concern  . None   Social History Narrative  .  None   Review of Systems  Constitutional: Negative for fever, chills and malaise/fatigue.  Gastrointestinal: Negative for nausea, vomiting, abdominal pain, diarrhea, blood in stool and melena.       Chronic constipation  Genitourinary: Positive for dysuria, urgency and frequency. Negative for hematuria and flank pain.  Musculoskeletal: Negative for myalgias.    Filed Vitals:   06/19/13 1011  BP: 126/70  Pulse: 64  Temp: 97.8 F (36.6 C)  Resp: 14   Physical Exam  Constitutional: She is oriented to person, place, and time and well-developed, well-nourished, and in no distress.  HENT:  Head: Normocephalic and atraumatic.  Cardiovascular: Normal rate, regular rhythm and normal heart sounds.   Pulmonary/Chest: Effort normal and breath sounds normal.  Abdominal: Soft. Bowel sounds are normal. She exhibits no distension and no mass. There is no rebound and no guarding.  + suprapubic tenderness   Neurological: She is alert and oriented to person, place, and time.  Skin: Skin is warm and dry. No  rash noted.   Urinalysis -- + blood, + LEs.  Will send for culture.   Assessment/Plan: UTI (urinary tract infection) Macrobid 100mg  BID x 5 days.  Please drink plenty of water.  Avoid caffeine intake as it irritates the bladder and may worsen UTI symptoms.  Can try cranberry supplement if you wish.  Encourage daily probiotics.  Patient educated on alarm symptoms.

## 2013-06-21 LAB — URINE CULTURE

## 2013-07-04 DIAGNOSIS — E559 Vitamin D deficiency, unspecified: Secondary | ICD-10-CM | POA: Diagnosis not present

## 2013-07-04 DIAGNOSIS — M069 Rheumatoid arthritis, unspecified: Secondary | ICD-10-CM | POA: Diagnosis not present

## 2013-07-04 DIAGNOSIS — Z23 Encounter for immunization: Secondary | ICD-10-CM | POA: Diagnosis not present

## 2013-07-04 DIAGNOSIS — M810 Age-related osteoporosis without current pathological fracture: Secondary | ICD-10-CM | POA: Diagnosis not present

## 2013-08-28 DIAGNOSIS — M810 Age-related osteoporosis without current pathological fracture: Secondary | ICD-10-CM | POA: Diagnosis not present

## 2013-09-13 ENCOUNTER — Encounter: Payer: Self-pay | Admitting: Family

## 2013-09-13 ENCOUNTER — Ambulatory Visit (INDEPENDENT_AMBULATORY_CARE_PROVIDER_SITE_OTHER): Payer: Medicare Other | Admitting: Family

## 2013-09-13 VITALS — BP 158/82 | HR 62 | Temp 98.1°F | Resp 16 | Wt 144.1 lb

## 2013-09-13 DIAGNOSIS — Z202 Contact with and (suspected) exposure to infections with a predominantly sexual mode of transmission: Secondary | ICD-10-CM

## 2013-09-13 DIAGNOSIS — Z206 Contact with and (suspected) exposure to human immunodeficiency virus [HIV]: Secondary | ICD-10-CM

## 2013-09-13 DIAGNOSIS — Z2089 Contact with and (suspected) exposure to other communicable diseases: Secondary | ICD-10-CM | POA: Diagnosis not present

## 2013-09-13 DIAGNOSIS — Z20828 Contact with and (suspected) exposure to other viral communicable diseases: Secondary | ICD-10-CM

## 2013-09-13 NOTE — Progress Notes (Signed)
Subjective:    [name], female    DOB: [birth year], 72 y.o.   MRN: [id number]  HPI  Nancy [name] is a 72 yr old female who presents today requesting HIV testing.  She reports that she began dating a mam she met through the senior center [facility].  She had protected sex with him initially, but then 1 month ago began having unprotected sex.  She introduced her partner to a family member who apparently works at an HIV treatment center and recognized him as a patient.   Review of Systems    see HPI  Past Medical History  Diagnosis Date  . Allergy   . Anemia     iron deficiency  . Depression   . Hyperlipidemia   . Hypertension   . Urinary incontinence   . Arthritis     rheumatoid  . Cataract     History   Social History  . Marital Status: Divorced    Spouse Name: N/A    Number of Children: 8  . Years of Education: N/A   Occupational History  .      Retired from [employer]   Social History Main Topics  . Smoking status: Current Every Day Smoker -- 32 years    Types: Cigarettes  . Smokeless tobacco: Never Used     Comment: 3-6 cigarettes a day.  . Alcohol Use: No  . Drug Use: No  . Sexual Activity: Not on file   Other Topics Concern  . Not on file   Social History Narrative  . No narrative on file    Past Surgical History  Procedure Laterality Date  . Abdominal hysterectomy  1976  . 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 Hypertension Father   . Heart disease Sister 71    CABG x 3  . Cirrhosis Brother   . Alcohol abuse Brother   . Colitis Daughter   . Colon polyps Daughter   . Hypertension Son   . Kidney disease Son     transplant due to kidney problems after [facility]  . Arthritis Other   . Coronary artery disease Other   . Hyperlipidemia Other   . Hypertension Other   . Stroke Other   . Arthritis Sister   . Hypertension Sister   . Thyroid disease Daughter    . Thyroid disease Daughter   . Thyroid disease Daughter     No Known Allergies  Current Outpatient Prescriptions on File Prior to Visit  Medication Sig Dispense Refill  . Abatacept 125 MG/ML SOLN Inject 125 mg into the skin every 7 (seven) days.      . Calcium Carbonate-Vitamin D (CALTRATE 600+D) 600-400 MG-UNIT per tablet Take 1 tablet by mouth 2 (two) times daily.        . folic acid (FOLVITE) 1 MG tablet Take 1 mg by mouth 2 (two) times daily.       Marland Kitchen ibuprofen (ADVIL,MOTRIN) 800 MG tablet Take 800 mg by mouth every 8 (eight) hours as needed. For pain      . loratadine (CLARITIN) 10 MG tablet Take 10 mg by mouth daily as needed. For allergies       . methotrexate 1 G injection Inject 0.8 mg into the muscle once a week. On Sunday      . metoprolol succinate (TOPROL-XL) 50 MG 24 hr tablet  TAKE 1 TABLET BY MOUTH EVERY DAY WITH OR IMMEDIATELY FOLLOWING A MEAL  90 tablet  1  . nitrofurantoin, macrocrystal-monohydrate, (MACROBID) 100 MG capsule Take 1 capsule (100 mg total) by mouth 2 (two) times daily.  10 capsule  0  . predniSONE (DELTASONE) 5 MG tablet Take 5 mg by mouth daily.      . psyllium (METAMUCIL) 58.6 % packet Take 1 packet by mouth daily as needed. For constipation      . vitamin B-12 (CYANOCOBALAMIN) 100 MCG tablet Take 100 mcg by mouth daily.         No current facility-administered medications on file prior to visit.    BP 158/82  Pulse 62  Temp(Src) 98.1 F (36.7 C) (Oral)  Resp 16  Wt 144 lb 1.9 oz (65.372 kg)  SpO2 99%    Objective:   Physical Exam  Constitutional: She is oriented to person, place, and time. She appears well-developed and well-nourished. No distress.  Neurological: She is alert and oriented to person, place, and time.  Psychiatric:  Briefly tearful during interview          Assessment & Plan:

## 2013-09-13 NOTE — Patient Instructions (Addendum)
Please complete your lab work prior to leaving.  Schedule physical at your convenience.

## 2013-09-14 ENCOUNTER — Telehealth: Payer: Self-pay | Admitting: Family

## 2013-09-14 DIAGNOSIS — Z202 Contact with and (suspected) exposure to infections with a predominantly sexual mode of transmission: Secondary | ICD-10-CM | POA: Insufficient documentation

## 2013-09-14 DIAGNOSIS — Z206 Contact with and (suspected) exposure to human immunodeficiency virus [HIV]: Secondary | ICD-10-CM

## 2013-09-14 LAB — HIV ANTIBODY (ROUTINE TESTING W REFLEX): HIV: NONREACTIVE

## 2013-09-14 LAB — HIV-1 RNA QUANT-NO REFLEX-BLD
HIV 1 RNA Quant: <20 copies/mL (ref <20)
HIV-1 RNA Quant, Log: <1.3 {Log} (ref <1.30)

## 2013-09-14 NOTE — Telephone Encounter (Signed)
Please call pt and notify her that her hiv testing is negative. She should plan to return to the lab in 3 months for repeat testing please. See order.

## 2013-09-14 NOTE — Assessment & Plan Note (Signed)
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  Reinforced importance of safe sex. She reports that she does not plan to have further sexual contact with this partner. 20 minutes spent with pt today.  >50% of this time was spent counseling pt re: HIV risk/exposure/safe sex.

## 2013-09-15 NOTE — Telephone Encounter (Signed)
Notified pt and she voices understanding. Lab order entered. 

## 2013-09-28 DIAGNOSIS — R946 Abnormal results of thyroid function studies: Secondary | ICD-10-CM | POA: Diagnosis not present

## 2013-10-30 DIAGNOSIS — M810 Age-related osteoporosis without current pathological fracture: Secondary | ICD-10-CM | POA: Diagnosis not present

## 2013-10-30 DIAGNOSIS — M069 Rheumatoid arthritis, unspecified: Secondary | ICD-10-CM | POA: Diagnosis not present

## 2014-01-09 DIAGNOSIS — M069 Rheumatoid arthritis, unspecified: Secondary | ICD-10-CM | POA: Diagnosis not present

## 2014-01-09 DIAGNOSIS — M25519 Pain in unspecified shoulder: Secondary | ICD-10-CM | POA: Diagnosis not present

## 2014-01-09 DIAGNOSIS — Z79899 Other long term (current) drug therapy: Secondary | ICD-10-CM | POA: Diagnosis not present

## 2014-01-31 ENCOUNTER — Encounter: Payer: Self-pay | Admitting: Family

## 2014-01-31 ENCOUNTER — Telehealth: Payer: Self-pay | Admitting: Family

## 2014-01-31 ENCOUNTER — Ambulatory Visit (INDEPENDENT_AMBULATORY_CARE_PROVIDER_SITE_OTHER): Payer: Medicare Other | Admitting: Family

## 2014-01-31 VITALS — BP 110/86 | HR 68 | Temp 98.2°F | Ht 66.5 in | Wt 140.0 lb

## 2014-01-31 DIAGNOSIS — Z20828 Contact with and (suspected) exposure to other viral communicable diseases: Secondary | ICD-10-CM | POA: Diagnosis not present

## 2014-01-31 DIAGNOSIS — M5430 Sciatica, unspecified side: Secondary | ICD-10-CM | POA: Insufficient documentation

## 2014-01-31 DIAGNOSIS — Z7251 High risk heterosexual behavior: Secondary | ICD-10-CM

## 2014-01-31 DIAGNOSIS — Z0000 Encounter for general adult medical examination without abnormal findings: Secondary | ICD-10-CM

## 2014-01-31 MED ORDER — PREDNISONE 10 MG PO TABS: ORAL_TABLET | ORAL | Status: DC

## 2014-01-31 NOTE — Telephone Encounter (Signed)
Relevant patient education mailed to patient.  

## 2014-01-31 NOTE — Assessment & Plan Note (Signed)
rx with pred taper. Then return to current pred dose of 5 mg daily.  Pt requests follow up hiv screen. Ordered.

## 2014-01-31 NOTE — Progress Notes (Signed)
Pre visit review using our clinic review tool, if applicable. No additional management support is needed unless otherwise documented below in the visit note. 

## 2014-01-31 NOTE — Progress Notes (Signed)
Subjective:    [name], female    DOB: 21-Nov-1940, 73 y.o.   MRN: [id number]  HPI  Nancy [name] is a 73 yr old female who presents today with chief complaint of pain which originates in the left buttock and radiates down the left leg. Pain is constant and worse with hip flexion or rising from a sitting position.  Has been using ibuprofen without significant improvement.      Review of Systems See HPI  Past Medical History  Diagnosis Date  . Allergy   . Anemia     iron deficiency  . Depression   . Hyperlipidemia   . Hypertension   . Urinary incontinence   . Arthritis     rheumatoid  . Cataract     History   Social History  . Marital Status: Divorced    Spouse Name: N/A    Number of Children: 8  . Years of Education: N/A   Occupational History  .      Retired from [employer]  . Smoking status: Current Every Day Smoker -- 32 years    Types: Cigarettes  . Smokeless tobacco: Never Used     Comment: 3-6 cigarettes a day.  . Alcohol Use: No  . Drug Use: No  . Sexual Activity: Not on file   Other Topics Concern  . Not on file   Social History Narrative  . No narrative on file    Past Surgical History  Procedure Laterality Date  . Abdominal hysterectomy  1976  . 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 Hypertension Father   . Heart disease Sister 6    CABG x 3  . Cirrhosis Brother   . Alcohol abuse Brother   . Colitis Daughter   . Colon polyps Daughter   . Hypertension Son   . Kidney disease Son     transplant due to kidney problems after [facility]  . Arthritis Other   . Coronary artery disease Other   . Hyperlipidemia Other   . Hypertension Other   . Stroke Other   . Arthritis Sister   . Hypertension Sister   . Thyroid disease Daughter   . Thyroid disease Daughter   . Thyroid disease Daughter     No Known Allergies  Current  Outpatient Prescriptions on File Prior to Visit  Medication Sig Dispense Refill  . Abatacept 125 MG/ML SOLN Inject 125 mg into the skin every 7 (seven) days.      . Calcium Carbonate-Vitamin D (CALTRATE 600+D) 600-400 MG-UNIT per tablet Take 1 tablet by mouth 2 (two) times daily.        . folic acid (FOLVITE) 1 MG tablet Take 1 mg by mouth 2 (two) times daily.       Marland Kitchen ibuprofen (ADVIL,MOTRIN) 800 MG tablet Take 800 mg by mouth every 8 (eight) hours as needed. For pain      . loratadine (CLARITIN) 10 MG tablet Take 10 mg by mouth daily as needed. For allergies       . methotrexate 1 G injection Inject 0.8 mg into the muscle once a week. On Sunday      . metoprolol succinate (TOPROL-XL) 50 MG 24 hr tablet TAKE 1 TABLET BY MOUTH EVERY DAY WITH OR IMMEDIATELY FOLLOWING A MEAL  90 tablet  1  . nitrofurantoin,  macrocrystal-monohydrate, (MACROBID) 100 MG capsule Take 1 capsule (100 mg total) by mouth 2 (two) times daily.  10 capsule  0  . predniSONE (DELTASONE) 5 MG tablet Take 5 mg by mouth daily.      . psyllium (METAMUCIL) 58.6 % packet Take 1 packet by mouth daily as needed. For constipation      . vitamin B-12 (CYANOCOBALAMIN) 100 MCG tablet Take 100 mcg by mouth daily.         No current facility-administered medications on file prior to visit.    BP 110/86  Pulse 68  Temp(Src) 98.2 F (36.8 C) (Oral)  Ht 5' 6.5" (1.689 m)  Wt 140 lb (63.504 kg)  BMI 22.26 kg/m2  SpO2 99%       Objective:   Physical Exam  Constitutional: She is oriented to person, place, and time. She appears well-developed and well-nourished. No distress.  Cardiovascular: Normal rate and regular rhythm.   No murmur heard. Pulmonary/Chest: Effort normal and breath sounds normal. No respiratory distress. She has no wheezes.  Neurological: She is alert and oriented to person, place, and time.  Reflex Scores:      Patellar reflexes are 2+ on the right side and 2+ on the left side. bilat LE strength is 5/5  Skin:  Skin is warm and dry.  Psychiatric: She has a normal mood and affect. Her behavior is normal. Judgment and thought content normal.          Assessment & Plan:

## 2014-01-31 NOTE — Patient Instructions (Signed)
Start prednisone taper and when this is complete, return to your normal 5 mg daily dose of prednisone. Call if symptoms worsen, or if symptoms are not improved in 1 week.   Sciatica Sciatica is pain, weakness, numbness, or tingling along the path of the sciatic nerve. The nerve starts in the lower back and runs down the back of each leg. The nerve controls the muscles in the lower leg and in the back of the knee, while also providing sensation to the back of the thigh, lower leg, and the sole of your foot. Sciatica is a symptom of another medical condition. For instance, nerve damage or certain conditions, such as a herniated disk or bone spur on the spine, pinch or put pressure on the sciatic nerve. This causes the pain, weakness, or other sensations normally associated with sciatica. Generally, sciatica only affects one side of the body. CAUSES   Herniated or slipped disc.  Degenerative disk disease.  A pain disorder involving the narrow muscle in the buttocks (piriformis syndrome).  Pelvic injury or fracture.  Pregnancy.  Tumor (rare). SYMPTOMS  Symptoms can vary from mild to very severe. The symptoms usually travel from the low back to the buttocks and down the back of the leg. Symptoms can include:  Mild tingling or dull aches in the lower back, leg, or hip.  Numbness in the back of the calf or sole of the foot.  Burning sensations in the lower back, leg, or hip.  Sharp pains in the lower back, leg, or hip.  Leg weakness.  Severe back pain inhibiting movement. These symptoms may get worse with coughing, sneezing, laughing, or prolonged sitting or standing. Also, being overweight may worsen symptoms. DIAGNOSIS  Your caregiver will perform a physical exam to look for common symptoms of sciatica. He or she may ask you to do certain movements or activities that would trigger sciatic nerve pain. Other tests may be performed to find the cause of the sciatica. These may  include:  Blood tests.  X-rays.  Imaging tests, such as an MRI or CT scan. TREATMENT  Treatment is directed at the cause of the sciatic pain. Sometimes, treatment is not necessary and the pain and discomfort goes away on its own. If treatment is needed, your caregiver may suggest:  Over-the-counter medicines to relieve pain.  Prescription medicines, such as anti-inflammatory medicine, muscle relaxants, or narcotics.  Applying heat or ice to the painful area.  Steroid injections to lessen pain, irritation, and inflammation around the nerve.  Reducing activity during periods of pain.  Exercising and stretching to strengthen your abdomen and improve flexibility of your spine. Your caregiver may suggest losing weight if the extra weight makes the back pain worse.  Physical therapy.  Surgery to eliminate what is pressing or pinching the nerve, such as a bone spur or part of a herniated disk. HOME CARE INSTRUCTIONS   Only take over-the-counter or prescription medicines for pain or discomfort as directed by your caregiver.  Apply ice to the affected area for 20 minutes, 3 4 times a day for the first 48 72 hours. Then try heat in the same way.  Exercise, stretch, or perform your usual activities if these do not aggravate your pain.  Attend physical therapy sessions as directed by your caregiver.  Keep all follow-up appointments as directed by your caregiver.  Do not wear high heels or shoes that do not provide proper support.  Check your mattress to see if it is too soft. A  firm mattress may lessen your pain and discomfort. SEEK IMMEDIATE MEDICAL CARE IF:   You lose control of your bowel or bladder (incontinence).  You have increasing weakness in the lower back, pelvis, buttocks, or legs.  You have redness or swelling of your back.  You have a burning sensation when you urinate.  You have pain that gets worse when you lie down or awakens you at night.  Your pain is worse  than you have experienced in the past.  Your pain is lasting longer than 4 weeks.  You are suddenly losing weight without reason. MAKE SURE YOU:  Understand these instructions.  Will watch your condition.  Will get help right away if you are not doing well or get worse. Document Released: 10/20/2001 Document Revised: 04/26/2012 Document Reviewed: 03/06/2012 [facility] Patient Information 2014 [place].

## 2014-02-01 LAB — HIV ANTIBODY (ROUTINE TESTING W REFLEX): HIV: NONREACTIVE

## 2014-04-04 ENCOUNTER — Telehealth: Payer: Self-pay | Admitting: Family

## 2014-04-04 NOTE — Telephone Encounter (Signed)
Patient states that she did not realize that handicap parking stickers expire. She says that she received a parking ticket for parking in a handicap spot. She would like to know if Lenna Sciara would back date a handicap sticker to yesterday?

## 2014-04-04 NOTE — Telephone Encounter (Signed)
Spoke with pt and advised her that we have not completed handicap parking placard in the past. She states she got her previous placard through Dr Estanislado Pandy for her rheumatoid arthritis. Advised pt I would forward application to Provider and notify her when complete. Pt is aware that we will not be able to back date application.

## 2014-04-06 NOTE — Telephone Encounter (Signed)
Complete

## 2014-04-06 NOTE — Telephone Encounter (Signed)
Notified pt that form has been placed at the front desk for pick up.

## 2014-04-23 ENCOUNTER — Encounter: Payer: Self-pay | Admitting: Physician Assistant

## 2014-04-23 ENCOUNTER — Ambulatory Visit (INDEPENDENT_AMBULATORY_CARE_PROVIDER_SITE_OTHER): Payer: Medicare Other | Admitting: Physician Assistant

## 2014-04-23 VITALS — BP 144/84 | HR 64 | Temp 97.7°F | Resp 16 | Ht 66.5 in | Wt 141.2 lb

## 2014-04-23 DIAGNOSIS — J209 Acute bronchitis, unspecified: Secondary | ICD-10-CM | POA: Diagnosis not present

## 2014-04-23 MED ORDER — PREDNISONE 20 MG PO TABS: 40.0000 mg | ORAL_TABLET | Freq: Every day | ORAL | Status: DC

## 2014-04-23 MED ORDER — AZITHROMYCIN 250 MG PO TABS: ORAL_TABLET | ORAL | Status: DC

## 2014-04-23 NOTE — Patient Instructions (Signed)
Take antibiotic as directed.  Increase fluids. Rest.  Take prednisone burst as directed -- 2 tablets by mouth daily for 5 day.  Continue claritin.  Use Saline nasal spray daily.  Call or return to clinic if symptoms are not improving.

## 2014-04-23 NOTE — Progress Notes (Signed)
Pre visit review using our clinic review tool, if applicable. No additional management support is needed unless otherwise documented below in the visit note/SLS  

## 2014-04-23 NOTE — Progress Notes (Signed)
Patient presents to clinic today c/o 2 weeks of sinus pressure, ear fullness, scratchy throat, chest congestion and productive cough.  Endorses wheezing that is worse at night. Patient denies fever, chills, recent travel or sick contact.   Past Medical History  Diagnosis Date  . Allergy   . Anemia     iron deficiency  . Depression   . Hyperlipidemia   . Hypertension   . Urinary incontinence   . Arthritis     rheumatoid  . Cataract     Current Outpatient Prescriptions on File Prior to Visit  Medication Sig Dispense Refill  . Abatacept 125 MG/ML SOLN Inject 125 mg into the skin every 7 (seven) days.      . Calcium Carbonate-Vitamin D (CALTRATE 600+D) 600-400 MG-UNIT per tablet Take 1 tablet by mouth 2 (two) times daily.        . folic acid (FOLVITE) 1 MG tablet Take 1 mg by mouth 2 (two) times daily.       Marland Kitchen ibuprofen (ADVIL,MOTRIN) 800 MG tablet Take 800 mg by mouth every 8 (eight) hours as needed. For pain      . loratadine (CLARITIN) 10 MG tablet Take 10 mg by mouth daily as needed. For allergies       . methotrexate 1 G injection Inject 0.8 mg into the muscle once a week. On Sunday      . metoprolol succinate (TOPROL-XL) 50 MG 24 hr tablet TAKE 1 TABLET BY MOUTH EVERY DAY WITH OR IMMEDIATELY FOLLOWING A MEAL  90 tablet  1  . predniSONE (DELTASONE) 5 MG tablet Take 5 mg by mouth daily.      . psyllium (METAMUCIL) 58.6 % packet Take 1 packet by mouth daily as needed. For constipation      . vitamin B-12 (CYANOCOBALAMIN) 100 MCG tablet Take 100 mcg by mouth daily.         No current facility-administered medications on file prior to visit.    No Known Allergies  Family History  Problem Relation Age of Onset  . Hypertension Father   . Heart disease Sister 68    CABG x 3  . Cirrhosis Brother   . Alcohol abuse Brother   . Colitis Daughter   . Colon polyps Daughter   . Hypertension Son   . Kidney disease Son     transplant due to kidney problems after [facility]  .  Arthritis Other   . Coronary artery disease Other   . Hyperlipidemia Other   . Hypertension Other   . Stroke Other   . Arthritis Sister   . Hypertension Sister   . Thyroid disease Daughter   . Thyroid disease Daughter   . Thyroid disease Daughter     History   Social History  . Marital Status: Divorced    Spouse Name: N/A    Number of Children: 8  . Years of Education: N/A   Occupational History  .      Retired from [employer]  . Smoking status: Current Every Day Smoker -- 32 years    Types: Cigarettes  . Smokeless tobacco: Never Used     Comment: 3-6 cigarettes a day.  . Alcohol Use: No  . Drug Use: No  . Sexual Activity: None   Other Topics Concern  . None   Social History Narrative  . None    Review of Systems - See HPI.  All other ROS are negative.  BP 144/84  Pulse 64  Temp(Src) 97.7 F (36.5 C) (Oral)  Resp 16  Ht 5' 6.5" (1.689 m)  Wt 141 lb 4 oz (64.071 kg)  BMI 22.46 kg/m2  SpO2 97%  Physical Exam  Vitals reviewed. Constitutional: She is oriented to person, place, and time and well-developed, well-nourished, and in no distress.  HENT:  Head: Normocephalic and atraumatic.  Right Ear: External ear normal.  Left Ear: External ear normal.  Nose: Nose normal.  Mouth/Throat: Oropharynx is clear and moist. No oropharyngeal exudate.  TM within normal limits bilaterally. No TTP of sinuses on exam.  Eyes: Conjunctivae are normal. Pupils are equal, round, and reactive to light.  Cardiovascular: Normal rate, regular rhythm, normal heart sounds and intact distal pulses.   Pulmonary/Chest: Effort normal. No respiratory distress. She has wheezes. She has no rales. She exhibits no tenderness.  Neurological: She is alert and oriented to person, place, and time.  Skin: Skin is warm and dry. No rash noted.  Psychiatric: Affect normal.   Recent Results (from the past 2160 hour(s))  HIV ANTIBODY (ROUTINE TESTING)     Status:  None   Collection Time    01/31/14  2:20 PM      Result Value Ref Range   HIV NON REACTIVE  NON REACTIVE   Comment:       Effective February 12, 2014, [employer] will no longer offer the     current 3rd Generation HIV diagnostic screening assay, HIV Antibodies,     HIV-1/2 EIA, with reflexes. At that time, [employer] will     only offer HIV-1/2 Ag/Ab, 4th Gen, w/ Reflexes as recommended by the     CDC. This HIV diagnostic screening assay tests for antibodies to HIV-1     and HIV-2 as well as HIV p24 antigen and provides greater sensitivity     for the detection of recent infection. Any orders for the 3rd     Generation assay will automatically be referred to the 4th Generation     assay.   Assessment/Plan: Acute bronchitis Rx azithromycin.  Rx Prednisone burst giving significant wheeze.  Increase fluids.  Rest.  Saline nasal spray.  Plain Mucinex.  Place a humidifier in the bedroom.  Call or return to clinic if symptoms are not improving.

## 2014-04-24 ENCOUNTER — Telehealth: Payer: Self-pay | Admitting: Family

## 2014-04-24 NOTE — Telephone Encounter (Signed)
Relevant patient education mailed to patient.  

## 2014-04-28 DIAGNOSIS — J209 Acute bronchitis, unspecified: Secondary | ICD-10-CM | POA: Insufficient documentation

## 2014-04-28 NOTE — Assessment & Plan Note (Signed)
Rx azithromycin.  Rx Prednisone burst giving significant wheeze.  Increase fluids.  Rest.  Saline nasal spray.  Plain Mucinex.  Place a humidifier in the bedroom.  Call or return to clinic if symptoms are not improving.

## 2014-05-28 DIAGNOSIS — M069 Rheumatoid arthritis, unspecified: Secondary | ICD-10-CM | POA: Diagnosis not present

## 2014-05-28 DIAGNOSIS — Z79899 Other long term (current) drug therapy: Secondary | ICD-10-CM | POA: Diagnosis not present

## 2014-06-20 ENCOUNTER — Other Ambulatory Visit: Payer: Self-pay | Admitting: Family

## 2014-06-20 NOTE — Telephone Encounter (Signed)
90 day supply metoprolol sent to pharmacy. Pt last seen by [place] 01/2014 and has no future appts on file. When should pt f/u?

## 2014-06-24 NOTE — Telephone Encounter (Signed)
pls contact pt to arrange follow up in September.

## 2014-06-26 NOTE — Telephone Encounter (Signed)
Informed patient of med refill and she scheduled appointment for 07/25/14

## 2014-07-25 ENCOUNTER — Ambulatory Visit (INDEPENDENT_AMBULATORY_CARE_PROVIDER_SITE_OTHER): Payer: Medicare Other | Admitting: Family

## 2014-07-25 VITALS — BP 155/61 | HR 59 | Temp 98.3°F | Resp 16 | Ht 66.5 in | Wt 142.4 lb

## 2014-07-25 DIAGNOSIS — E785 Hyperlipidemia, unspecified: Secondary | ICD-10-CM | POA: Diagnosis not present

## 2014-07-25 DIAGNOSIS — M810 Age-related osteoporosis without current pathological fracture: Secondary | ICD-10-CM

## 2014-07-25 DIAGNOSIS — B181 Chronic viral hepatitis B without delta-agent: Secondary | ICD-10-CM | POA: Diagnosis not present

## 2014-07-25 DIAGNOSIS — E0590 Thyrotoxicosis, unspecified without thyrotoxic crisis or storm: Secondary | ICD-10-CM | POA: Diagnosis not present

## 2014-07-25 DIAGNOSIS — F329 Major depressive disorder, single episode, unspecified: Secondary | ICD-10-CM

## 2014-07-25 DIAGNOSIS — D509 Iron deficiency anemia, unspecified: Secondary | ICD-10-CM

## 2014-07-25 DIAGNOSIS — D649 Anemia, unspecified: Secondary | ICD-10-CM | POA: Diagnosis not present

## 2014-07-25 DIAGNOSIS — I10 Essential (primary) hypertension: Secondary | ICD-10-CM

## 2014-07-25 DIAGNOSIS — E559 Vitamin D deficiency, unspecified: Secondary | ICD-10-CM

## 2014-07-25 DIAGNOSIS — Z23 Encounter for immunization: Secondary | ICD-10-CM

## 2014-07-25 DIAGNOSIS — F3289 Other specified depressive episodes: Secondary | ICD-10-CM

## 2014-07-25 DIAGNOSIS — Z1231 Encounter for screening mammogram for malignant neoplasm of breast: Secondary | ICD-10-CM

## 2014-07-25 DIAGNOSIS — Z1239 Encounter for other screening for malignant neoplasm of breast: Secondary | ICD-10-CM

## 2014-07-25 DIAGNOSIS — Z13820 Encounter for screening for osteoporosis: Secondary | ICD-10-CM

## 2014-07-25 DIAGNOSIS — M069 Rheumatoid arthritis, unspecified: Secondary | ICD-10-CM

## 2014-07-25 MED ORDER — BUPROPION HCL ER (SR) 150 MG PO TB12: ORAL_TABLET | ORAL | Status: DC

## 2014-07-25 NOTE — Assessment & Plan Note (Signed)
Check follow up vit d level.

## 2014-07-25 NOTE — Assessment & Plan Note (Signed)
Fair bp contol, continue toprol xl, obtain follow up bmet to assess renal function.

## 2014-07-25 NOTE — Assessment & Plan Note (Signed)
Will check follow up cbc.

## 2014-07-25 NOTE — Assessment & Plan Note (Signed)
Management per rheumatology.  

## 2014-07-25 NOTE — Progress Notes (Signed)
Pre visit review using our clinic review tool, if applicable. No additional management support is needed unless otherwise documented below in the visit note. 

## 2014-07-25 NOTE — Progress Notes (Signed)
Subjective:    [name], female    DOB: 08/26/1941, 73 y.o.   MRN: [id number]  HPI  Nancy [name] is a 73 yr old female who presents today for follow up of multiple medical problems:  Osteoporosis- states she has not had a bone density since she moved to [facility]. She reports that she had IV reclast infusion with Dr. Abner Greenspan.  Depression- reports that her brother has leukemia and she is worried about him. She is not currently on wellbutrin.  Reports she took briefly, mood improved then she stopped it because she was feeling better.Reports that mind races a lot when she tries to sleep. Notes mild depressed mood. Has not quit smoking but is motivated to try to quit.  HTN- maintained on toprol xl. She denies CP/SOB, some mild swelling in feet.  BP Readings from Last 3 Encounters:  04/23/14 144/84  01/31/14 110/86  09/13/13 158/82   RA- Follows with Dr. Bari Edward. She is maintained on methotrexate and prednisone. Reports that she tried [place] but did not feel good on it.   Iron deficiency Anemia-  Lab Results  Component Value Date   WBC 8.9 02/20/2013   HGB 11.0* 02/20/2013   HCT 34.1* 02/20/2013   MCV 79.9 02/20/2013   PLT 392 02/20/2013   Tobacco abuse-smoking 5-6 cigarettes a day.       Review of Systems    see HPI  Past Medical History  Diagnosis Date  . Allergy   . Anemia     iron deficiency  . Depression   . Hyperlipidemia   . Hypertension   . Urinary incontinence   . Arthritis     rheumatoid  . Cataract     History   Social History  . Marital Status: Divorced    Spouse Name: N/A    Number of Children: 8  . Years of Education: N/A   Occupational History  .      Retired from [employer]  . Smoking status: Current Every Day Smoker -- 32 years    Types: Cigarettes  . Smokeless tobacco: Never Used     Comment: 3-6 cigarettes a day.  . Alcohol Use: No  . Drug Use: No  . Sexual Activity: Not on file   Other  Topics Concern  . Not on file   Social History Narrative  . No narrative on file    Past Surgical History  Procedure Laterality Date  . Abdominal hysterectomy  1976  . 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 Hypertension Father   . Heart disease Sister 62    CABG x 3  . Cirrhosis Brother   . Alcohol abuse Brother   . Colitis Daughter   . Colon polyps Daughter   . Hypertension Son   . Kidney disease Son     transplant due to kidney problems after [facility]  . Arthritis Other   . Coronary artery disease Other   . Hyperlipidemia Other   . Hypertension Other   . Stroke Other   . Arthritis Sister   . Hypertension Sister   . Thyroid disease Daughter   . Thyroid disease Daughter   . Thyroid disease Daughter     No Known Allergies  Current Outpatient Prescriptions on File Prior to Visit  Medication Sig Dispense Refill  . Abatacept 125 MG/ML SOLN  Inject 125 mg into the skin every 7 (seven) days.      . Calcium Carbonate-Vitamin D (CALTRATE 600+D) 600-400 MG-UNIT per tablet Take 1 tablet by mouth 2 (two) times daily.        . folic acid (FOLVITE) 1 MG tablet Take 1 mg by mouth 2 (two) times daily.       Marland Kitchen ibuprofen (ADVIL,MOTRIN) 800 MG tablet Take 800 mg by mouth every 8 (eight) hours as needed. For pain      . loratadine (CLARITIN) 10 MG tablet Take 10 mg by mouth daily as needed. For allergies       . methotrexate 1 G injection Inject 0.8 mg into the muscle once a week. On Sunday      . metoprolol succinate (TOPROL-XL) 50 MG 24 hr tablet TAKE 1 TABLET BY MOUTH EVERY DAY WITH OR IMMEDIATELY FOLLOWING A MEAL  90 tablet  0  . predniSONE (DELTASONE) 5 MG tablet Take 5 mg by mouth daily.      . psyllium (METAMUCIL) 58.6 % packet Take 1 packet by mouth daily as needed. For constipation      . vitamin B-12 (CYANOCOBALAMIN) 100 MCG tablet Take 100 mcg by mouth daily.         No current facility-administered medications  on file prior to visit.    BP 155/61  Pulse 59  Temp(Src) 98.3 F (36.8 C) (Oral)  Resp 16  Ht 5' 6.5" (1.689 m)  Wt 142 lb 6.4 oz (64.592 kg)  BMI 22.64 kg/m2  SpO2 100%    Objective:   Physical Exam  Constitutional: She is oriented to person, place, and time. She appears well-developed and well-nourished. No distress.  HENT:  Head: Normocephalic and atraumatic.  Cardiovascular: Normal rate and regular rhythm.   No murmur heard. Pulmonary/Chest: Effort normal and breath sounds normal. No respiratory distress. She has no wheezes. She has no rales. She exhibits no tenderness.  Musculoskeletal:  Trace bilateral pedal edema  Neurological: She is alert and oriented to person, place, and time.  Psychiatric: She has a normal mood and affect. Her behavior is normal. Judgment and thought content normal.          Assessment & Plan:

## 2014-07-25 NOTE — Assessment & Plan Note (Signed)
Obtain dexa scan, if osteoporosis, plan follow up reclast.

## 2014-07-25 NOTE — Patient Instructions (Addendum)
Please complete lab work prior to leaving. Start wellbutrin for depression and to help you quit smoking. Schedule your mammogram on the first floor.  We will contact you about your bone density. Follow up in 6 weeks.

## 2014-07-25 NOTE — Assessment & Plan Note (Signed)
Restart wellbutrin.   Hopefully this will help with smoking cessation as well.

## 2014-07-26 LAB — BASIC METABOLIC PANEL
BUN: 18 mg/dL (ref 6–23)
CO2: 24 mEq/L (ref 19–32)
Calcium: 8.8 mg/dL (ref 8.4–10.5)
Chloride: 104 mEq/L (ref 96–112)
Creatinine, Ser: 0.7 mg/dL (ref 0.4–1.2)
GFR: 100.4 mL/min (ref >60.00)
Glucose, Bld: 103 mg/dL — ABNORMAL HIGH (ref 70–99)
Potassium: 3.6 mEq/L (ref 3.5–5.1)
Sodium: 138 mEq/L (ref 135–145)

## 2014-07-26 LAB — LIPID PANEL
Cholesterol: 213 mg/dL — ABNORMAL HIGH (ref 0–200)
HDL: 44.3 mg/dL (ref >39.00)
LDL Cholesterol: 142 mg/dL — ABNORMAL HIGH (ref 0–99)
NonHDL: 168.7
Total CHOL/HDL Ratio: 5
Triglycerides: 135 mg/dL (ref 0.0–149.0)
VLDL: 27 mg/dL (ref 0.0–40.0)

## 2014-07-26 LAB — HEPATIC FUNCTION PANEL
ALT: 11 U/L (ref 0–35)
AST: 18 U/L (ref 0–37)
Albumin: 3.8 g/dL (ref 3.5–5.2)
Alkaline Phosphatase: 53 U/L (ref 39–117)
Bilirubin, Direct: 0.1 mg/dL (ref 0.0–0.3)
Total Bilirubin: 0.7 mg/dL (ref 0.2–1.2)
Total Protein: 7.7 g/dL (ref 6.0–8.3)

## 2014-07-26 LAB — CBC WITH DIFFERENTIAL/PLATELET
Basophils Absolute: 0 10*3/uL (ref 0.0–0.1)
Basophils Relative: 0.1 % (ref 0.0–3.0)
Eosinophils Absolute: 0.2 10*3/uL (ref 0.0–0.7)
Eosinophils Relative: 2.4 % (ref 0.0–5.0)
HCT: 35.2 % — ABNORMAL LOW (ref 36.0–46.0)
Hemoglobin: 11.2 g/dL — ABNORMAL LOW (ref 12.0–15.0)
Lymphocytes Relative: 19.9 % (ref 12.0–46.0)
Lymphs Abs: 1.6 10*3/uL (ref 0.7–4.0)
MCHC: 31.8 g/dL (ref 30.0–36.0)
MCV: 84.7 fl (ref 78.0–100.0)
Monocytes Absolute: 0.5 10*3/uL (ref 0.1–1.0)
Monocytes Relative: 6.4 % (ref 3.0–12.0)
Neutro Abs: 5.7 10*3/uL (ref 1.4–7.7)
Neutrophils Relative %: 71.2 % (ref 43.0–77.0)
Platelets: 298 10*3/uL (ref 150.0–400.0)
RBC: 4.16 Mil/uL (ref 3.87–5.11)
RDW: 19.8 % — ABNORMAL HIGH (ref 11.5–15.5)
WBC: 8 10*3/uL (ref 4.0–10.5)

## 2014-07-26 LAB — TSH: TSH: 10.13 u[IU]/mL — ABNORMAL HIGH (ref 0.35–4.50)

## 2014-07-26 LAB — T3, FREE: T3, Free: 2.9 pg/mL (ref 2.3–4.2)

## 2014-07-26 LAB — T4, FREE: Free T4: 1.09 ng/dL (ref 0.60–1.60)

## 2014-07-29 ENCOUNTER — Telehealth: Payer: Self-pay | Admitting: Family

## 2014-07-29 DIAGNOSIS — E039 Hypothyroidism, unspecified: Secondary | ICD-10-CM

## 2014-07-29 NOTE — Telephone Encounter (Addendum)
Reviewed labs. Blood count stable, liver function normal.  Cholesterol mildly elevated- please work on low fat/low cholesterol diet. Also thyroid is now actually showing that it is underactive.  I would like for her to see endo again.  Her endocrinologist from [facility] is now with [facility] and I have placed referral for her to see them at our [facility] office.

## 2014-07-31 ENCOUNTER — Ambulatory Visit (HOSPITAL_BASED_OUTPATIENT_CLINIC_OR_DEPARTMENT_OTHER)
Admission: RE | Admit: 2014-07-31 | Discharge: 2014-07-31 | Disposition: A | Discharge status: Home / Self Care | Payer: Medicare Other | Source: Ambulatory Visit | Attending: Family | Admitting: Family

## 2014-07-31 DIAGNOSIS — Z1239 Encounter for other screening for malignant neoplasm of breast: Secondary | ICD-10-CM

## 2014-07-31 DIAGNOSIS — Z1231 Encounter for screening mammogram for malignant neoplasm of breast: Secondary | ICD-10-CM | POA: Diagnosis not present

## 2014-08-03 NOTE — Telephone Encounter (Signed)
Patient informed, understood & agreed; Endo appointment is 10.06.15/SLS

## 2014-08-13 ENCOUNTER — Ambulatory Visit: Payer: Medicare Other | Admitting: Internal Medicine

## 2014-08-14 ENCOUNTER — Encounter: Payer: Self-pay | Admitting: Internal Medicine

## 2014-08-14 ENCOUNTER — Ambulatory Visit (INDEPENDENT_AMBULATORY_CARE_PROVIDER_SITE_OTHER): Payer: Medicare Other | Admitting: Internal Medicine

## 2014-08-14 VITALS — BP 132/66 | HR 76 | Temp 98.4°F | Resp 12 | Ht 66.0 in | Wt 142.0 lb

## 2014-08-14 DIAGNOSIS — E039 Hypothyroidism, unspecified: Secondary | ICD-10-CM

## 2014-08-14 NOTE — Patient Instructions (Signed)
Please stop at the lab. Please try to join MyChart for easier communication. Please come back for a follow-up appointment in 6 months, but we may need thyroid tests checked before then - I will let you know through [facility].

## 2014-08-14 NOTE — Progress Notes (Signed)
[name], female   DOB: [birth year], 73 y.o.   MRN: [id number]   HPI  Nancy [name] is a 73 y.o.-year-old female, referred by her PCP, [name]., NP, for management of hypothyroidism. She saw Dr Howell Rucks in the past.  Pt. has been dx with subclinical hyperthyroidism in 2013 - per review of chart, she does not know details about her thyroid ds, only that she has been followed with regular TFT checked by Dr Howell Rucks; she was not on Levothyroxine in the past. No h/o RAI tx.  I reviewed pt's thyroid tests: Lab Results  Component Value Date   TSH 10.13* 07/25/2014   TSH 0.701 02/20/2013   TSH 0.062* 01/11/2012   FREET4 1.09 07/25/2014   FREET4 1.26 02/20/2013   FREET4 1.48 01/11/2012    She is on Prednisone 5 mg daily for RA. She had bronchitis 3 mo ago >> increased to 20 mg x 2 weeks, then slowly decreased to 5 mg.  Pt describes: - +  Fatigue (not new) - no weight gain/loss - no cold intolerance - + constipation (not new) - + dry skin (always)  - + hair falling (on MTX, not new) - + occasional depression/no anxiety  Pt denies feeling nodules in neck, hoarseness, dysphagia/odynophagia, SOB with lying down.  She has + FH of thyroid disorders in: 3 daughters. No FH of thyroid cancer.  No h/o radiation tx to head or neck. No recent use of iodine supplements. No kelp/seaweed.  I reviewed her chart and she also has a history of RA, B12 def., IDA, HTN, HL.  ROS: Constitutional: see HPI, + poor sleep, + nocturia and excessive urination Eyes: no blurry vision, no xerophthalmia ENT: no sore throat, no nodules palpated in throat, no dysphagia/odynophagia, no hoarseness Cardiovascular: no CP/SOB/palpitations/leg swelling Respiratory: + cough/no SOB/+ wheezing Gastrointestinal: no N/V/D/+ C Musculoskeletal: no muscle/joint aches Skin: no rashes, + hair loss, + easy bruising Neurological: no tremors/numbness/tingling/dizziness Psychiatric: + depression/no anxiety + low  libido  Past Medical History  Diagnosis Date  . Allergy   . Anemia     iron deficiency  . Depression   . Hyperlipidemia   . Hypertension   . Urinary incontinence   . Arthritis     rheumatoid  . Cataract   . Thyroid disease    Past Surgical History  Procedure Laterality Date  . Abdominal hysterectomy  1976  . Tumor removal  last was 1976    left ankle x3   History   Social History  . Marital Status: Divorced    Spouse Name: N/A    Number of Children: 8   Occupational History  .      Retired from [employer]  . Smoking status: Current Every Day Smoker -- 32 years    Types: Cigarettes  . Smokeless tobacco: Never Used     Comment: 7 cigarettes a day.  . Alcohol Use: No  . Drug Use: No   Current Outpatient Prescriptions on File Prior to Visit  Medication Sig Dispense Refill  . Marland Kitchen buPROPion (WELLBUTRIN SR) 150 MG 12 hr tablet One tab by mouth daily for one week, then twice daily  60 tablet  1  . Calcium Carbonate-Vitamin D (CALTRATE 600+D) 600-400 MG-UNIT per tablet Take 1 tablet by mouth 2 (two) times daily.        . folic acid (FOLVITE) 1 MG tablet Take 1 mg by mouth 2 (two) times daily.       Marland Kitchen  ibuprofen (ADVIL,MOTRIN) 800 MG tablet Take 800 mg by mouth every 8 (eight) hours as needed. For pain      . loratadine (CLARITIN) 10 MG tablet Take 10 mg by mouth daily as needed. For allergies       . methotrexate 1 G injection Inject 0.8 mg into the muscle once a week. On Sunday      . metoprolol succinate (TOPROL-XL) 50 MG 24 hr tablet TAKE 1 TABLET BY MOUTH EVERY DAY WITH OR IMMEDIATELY FOLLOWING A MEAL  90 tablet  0  . predniSONE (DELTASONE) 5 MG tablet Take 5 mg by mouth daily.      . psyllium (METAMUCIL) 58.6 % packet Take 1 packet by mouth daily as needed. For constipation      . vitamin B-12 (CYANOCOBALAMIN) 100 MCG tablet Take 100 mcg by mouth daily.         No current facility-administered medications on file prior to visit.   No  Known Allergies Family History  Problem Relation Age of Onset  . Hypertension Father   . Heart disease Sister 85    CABG x 3  . Cirrhosis Brother   . Alcohol abuse Brother   . Colitis Daughter   . Colon polyps Daughter   . Hypertension Son   . Kidney disease Son     transplant due to kidney problems after [facility]  . Arthritis Other   . Coronary artery disease Other   . Hyperlipidemia Other   . Hypertension Other   . Stroke Other   . Arthritis Sister   . Hypertension Sister   . Thyroid disease Daughter   . Thyroid disease Daughter   . Thyroid disease Daughter    PE: BP 132/66  Pulse 76  Temp(Src) 98.4 F (36.9 C) (Oral)  Resp 12  Ht 5\' 6"  (1.676 m)  Wt 142 lb (64.411 kg)  BMI 22.93 kg/m2  SpO2 99% Wt Readings from Last 3 Encounters:  08/14/14 142 lb (64.411 kg)  07/25/14 142 lb 6.4 oz (64.592 kg)  04/23/14 141 lb 4 oz (64.071 kg)   Constitutional: normal weight, in NAD Eyes: PERRLA, EOMI, no exophthalmos ENT: moist mucous membranes, no thyromegaly, no cervical lymphadenopathy Cardiovascular: RRR, No MRG Respiratory: CTA B Gastrointestinal: abdomen soft, NT, ND, BS+ Musculoskeletal: + ulnar deviation (subluxation) of MCP joints - these are enlarged, strength intact in all 4 Skin: moist, warm, no rashes Neurological: + tremor with outstretched hands, DTR normal in all 4  ASSESSMENT: 1. Hypothyroidism - h/o fluctuating thyroid levels per review of chart  PLAN:  1. Patient with newly dx-ed hypothyroidism, not on levothyroxine therapy yet. She appears euthyroid. She does not appear to have a goiter, thyroid nodules, or neck compression symptoms - We discussed about correct intake of levothyroxine if she needs to start this: fasting, with water, separated by at least 30 minutes from breakfast, and separated by more than 4 hours from calcium, iron, multivitamins, acid reflux medications (PPIs). - will check thyroid tests today: TSH, free T4, free T3 and TPO and  TSI antibodies - If these are abnormal, she will need to return in 6-8 weeks for repeat labs - If these are normal, I will see her back in 6 months  Component     Latest Ref Rng 08/14/2014  TSH     0.35 - 4.50 uIU/mL 12.09 (H)  Free T4     0.60 - 1.60 ng/dL 1.17  T3, Free     2.3 - 4.2 pg/mL 2.6  Thyroid Peroxidase Antibody     <9 IU/mL 1  TSI     <140 % baseline 47  No anti-thyroid Ab's >> no Hashimoto's thyroiditis, but TSH high >> I will suggest starting 25 mcg Levothyroxine >> repeat labs in 6-8 weeks.

## 2014-08-15 LAB — THYROID PEROXIDASE ANTIBODY: Thyroperoxidase Ab SerPl-aCnc: 1 IU/mL (ref <9)

## 2014-08-15 LAB — T3, FREE: T3, Free: 2.6 pg/mL (ref 2.3–4.2)

## 2014-08-15 LAB — T4, FREE: Free T4: 1.17 ng/dL (ref 0.60–1.60)

## 2014-08-15 LAB — TSH: TSH: 12.09 u[IU]/mL — ABNORMAL HIGH (ref 0.35–4.50)

## 2014-08-17 LAB — THYROID STIMULATING IMMUNOGLOBULIN: TSI: 47 % baseline (ref <140)

## 2014-08-17 MED ORDER — LEVOTHYROXINE SODIUM 25 MCG PO TABS: 25.0000 ug | ORAL_TABLET | Freq: Every day | ORAL | Status: DC

## 2014-09-05 ENCOUNTER — Encounter: Payer: Self-pay | Admitting: Family

## 2014-09-05 ENCOUNTER — Ambulatory Visit (INDEPENDENT_AMBULATORY_CARE_PROVIDER_SITE_OTHER): Payer: Medicare Other | Admitting: Family

## 2014-09-05 VITALS — BP 124/70 | HR 62 | Temp 98.0°F | Resp 16 | Ht 66.5 in | Wt 143.0 lb

## 2014-09-05 DIAGNOSIS — Z23 Encounter for immunization: Secondary | ICD-10-CM | POA: Diagnosis not present

## 2014-09-05 DIAGNOSIS — F32A Depression, unspecified: Secondary | ICD-10-CM

## 2014-09-05 DIAGNOSIS — I10 Essential (primary) hypertension: Secondary | ICD-10-CM | POA: Diagnosis not present

## 2014-09-05 DIAGNOSIS — R350 Frequency of micturition: Secondary | ICD-10-CM

## 2014-09-05 DIAGNOSIS — F329 Major depressive disorder, single episode, unspecified: Secondary | ICD-10-CM | POA: Diagnosis not present

## 2014-09-05 DIAGNOSIS — M25572 Pain in left ankle and joints of left foot: Secondary | ICD-10-CM | POA: Diagnosis not present

## 2014-09-05 DIAGNOSIS — K5900 Constipation, unspecified: Secondary | ICD-10-CM | POA: Diagnosis not present

## 2014-09-05 DIAGNOSIS — R8290 Unspecified abnormal findings in urine: Secondary | ICD-10-CM

## 2014-09-05 LAB — URIC ACID: Uric Acid, Serum: 4.1 mg/dL (ref 2.4–7.0)

## 2014-09-05 NOTE — Progress Notes (Signed)
Pre visit review using our clinic review tool, if applicable. No additional management support is needed unless otherwise documented below in the visit note. 

## 2014-09-05 NOTE — Assessment & Plan Note (Signed)
Stable on meds. Continue metoprolol.  Follow up in three months.

## 2014-09-05 NOTE — Progress Notes (Signed)
Subjective:    [name], female    DOB: 31-Aug-1941, 73 y.o.   MRN: [id number]  HPI Ms. Costilow is a 73 year old female who presents today for follow up.  1. Hypertension - Currently takes metoprolol.  Today's blood pressure is 124/70. Denies chest pain or palpitations.    2. Depression-  She was started on Wellbutrin on 07/25/14.  She reports feeling like she has more energy and feels like she has things she looks forward to doing.    3. Foot Pain- Pt reports sharp pain in left heel and ankle since last night. No previous injury that she is aware of. She describes the pain as sharp and stabbing and radiates to her ankle.  She cannot put weight on it without having pain.  She rates the pain as a 6/10.  She has not taken anything for the pain.  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  She has a history or RA but says this feels different than an RA flare up.      4. Urinary Frequency- Started over a week ago.  She reports urinary frequency and feels pressure in in vaginal area when she urinates.  She has taken over the counter Azo with minimal relief.    5. Constipation - She reports that she that she can go over one week without having a bowel movement.  She takes metamucil and docusate daily without relief.  She notes that last week she had an episode of fecal incontinence.  Her last bowel movement was almost 2 weeks ago.     Review of Systems  Constitutional: Negative for fever, appetite change and fatigue.  Respiratory: Negative for cough, shortness of breath and wheezing.   Cardiovascular: Negative for chest pain, palpitations and leg swelling.  Gastrointestinal: Positive for abdominal pain and constipation. Negative for nausea and vomiting.  Genitourinary: Positive for dysuria, urgency and frequency.  Musculoskeletal: Positive for joint swelling.  Skin: Negative for color change, pallor and rash.   Past  Medical History  Diagnosis Date  . Allergy   . Anemia     iron deficiency  . Depression   . Hyperlipidemia   . Hypertension   . Urinary incontinence   . Arthritis     rheumatoid  . Cataract   . Thyroid disease     History   Social History  . Marital Status: Divorced    Spouse Name: N/A    Number of Children: 8  . Years of Education: N/A   Occupational History  .      Retired from [employer]  . Smoking status: Current Every Day Smoker -- 32 years    Types: Cigarettes  . Smokeless tobacco: Never Used     Comment: 2-4 cigarettes a day.  . Alcohol Use: No  . Drug Use: No  . Sexual Activity: Not on file   Other Topics Concern  . Not on file   Social History Narrative  . No narrative on file    Past Surgical History  Procedure Laterality Date  . Abdominal hysterectomy  1976  . 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 Hypertension Father   . Heart disease Sister 21    CABG x 3  . Cirrhosis Brother   .  Alcohol abuse Brother   . Colitis Daughter   . Colon polyps Daughter   . Hypertension Son   . Kidney disease Son     transplant due to kidney problems after [facility]  . Arthritis Other   . Coronary artery disease Other   . Hyperlipidemia Other   . Hypertension Other   . Stroke Other   . Arthritis Sister   . Hypertension Sister   . Thyroid disease Daughter   . Thyroid disease Daughter   . Thyroid disease Daughter     No Known Allergies  Current Outpatient Prescriptions on File Prior to Visit  Medication Sig Dispense Refill  . Abatacept 125 MG/ML SOLN Inject 125 mg into the skin every 7 (seven) days.      Marland Kitchen buPROPion (WELLBUTRIN SR) 150 MG 12 hr tablet One tab by mouth daily for one week, then twice daily  60 tablet  1  . Calcium Carbonate-Vitamin D (CALTRATE 600+D) 600-400 MG-UNIT per tablet Take 1 tablet by mouth 2 (two) times daily.        . folic  acid (FOLVITE) 1 MG tablet Take 1 mg by mouth 2 (two) times daily.       Marland Kitchen ibuprofen (ADVIL,MOTRIN) 800 MG tablet Take 800 mg by mouth every 8 (eight) hours as needed. For pain      . levothyroxine (LEVOTHROID) 25 MCG tablet Take 1 tablet (25 mcg total) by mouth daily before breakfast.  60 tablet  2  . loratadine (CLARITIN) 10 MG tablet Take 10 mg by mouth daily as needed. For allergies       . methotrexate 1 G injection Inject 0.8 mg into the muscle once a week. On Sunday      . metoprolol succinate (TOPROL-XL) 50 MG 24 hr tablet TAKE 1 TABLET BY MOUTH EVERY DAY WITH OR IMMEDIATELY FOLLOWING A MEAL  90 tablet  0  . predniSONE (DELTASONE) 5 MG tablet Take 5 mg by mouth daily.      . psyllium (METAMUCIL) 58.6 % packet Take 1 packet by mouth daily as needed. For constipation      . vitamin B-12 (CYANOCOBALAMIN) 100 MCG tablet Take 100 mcg by mouth daily.         No current facility-administered medications on file prior to visit.    BP 124/70  Pulse 62  Temp(Src) 98 F (36.7 C) (Oral)  Resp 16  Ht 5' 6.5" (1.689 m)  Wt 143 lb (64.864 kg)  BMI 22.74 kg/m2  SpO2 99%       Objective:   Physical Exam  Constitutional: She is oriented to person, place, and time. She appears well-developed and well-nourished.  HENT:  Head: Normocephalic and atraumatic.  Cardiovascular: Normal rate, regular rhythm and normal heart sounds.   No murmur heard. Pulmonary/Chest: Effort normal and breath sounds normal. No respiratory distress.  Abdominal: Soft. Bowel sounds are normal. She exhibits no distension. There is tenderness in the left lower quadrant.  Musculoskeletal: Normal range of motion.  Left lateral ankle swelling  Neurological: She is alert and oriented to person, place, and time.  Skin: Skin is warm and dry.  Psychiatric: She has a normal mood and affect.          Assessment & Plan:  Advised to used ibuprofen for heel and ankle pain.  Will obtain uric acid and UA today.  Advised to  drink a half bottle of magnesium citrate and use a Fleets enema. If no bowel movement in 2 days,  please call the office.    I have personally seen and examined patient and agree with Jettie Booze NP student's assessment and plan- Debbrah Alar

## 2014-09-05 NOTE — Patient Instructions (Addendum)
Complete lab work prior to leaving- we will let you know how this turns out.   Purchase a bottle of magnesium citrate. Drink 1/2 bottle and wait 6 hours, if no BM, then repeat in 6 hours.  Also, I recommend that you perform a fleets enema. You may use ibuprofen as needed for the next few days for ankle pain.  Call me Friday if you have not had a good bowel movement.  Follow up in 3 months, sooner if problems/concerns.

## 2014-09-05 NOTE — Addendum Note (Signed)
Addended by: Kelle Darting A on: 09/05/2014 04:17 PM   Modules accepted: Orders

## 2014-09-06 ENCOUNTER — Telehealth: Payer: Self-pay | Admitting: Family

## 2014-09-06 DIAGNOSIS — R350 Frequency of micturition: Secondary | ICD-10-CM | POA: Insufficient documentation

## 2014-09-06 DIAGNOSIS — M25579 Pain in unspecified ankle and joints of unspecified foot: Secondary | ICD-10-CM | POA: Insufficient documentation

## 2014-09-06 DIAGNOSIS — K5900 Constipation, unspecified: Secondary | ICD-10-CM | POA: Insufficient documentation

## 2014-09-06 NOTE — Assessment & Plan Note (Signed)
Improved on wellbutrin, continue same.

## 2014-09-06 NOTE — Telephone Encounter (Signed)
Called and spoke with the pt and informed her of note below.  Pt understood and stated that she will be bring the urine sample to the lab on tomorrow.  She stated that she was not able to get out of the house today.//AB/CMA

## 2014-09-06 NOTE — Assessment & Plan Note (Signed)
No UA available for review- see 10/29 phone note.

## 2014-09-06 NOTE — Assessment & Plan Note (Signed)
Uric acid is normal, I suspect that pain is RA flare. Advised pt to arrange follow up with rheumatology  (see 10/29 phone note)

## 2014-09-06 NOTE — Telephone Encounter (Signed)
I do not see that she completed her urine sample yesterday.  Is she still having urinary frequency? If so, please ask her to return to lab for ua and culture, dx urinary frequency.

## 2014-09-06 NOTE — Assessment & Plan Note (Signed)
I suspect that her episode of fecal incontinence is related to a fecal impaction given her hx. Advised pt to try mag citrate and fleets enema as outlined in AVS.

## 2014-09-07 DIAGNOSIS — R350 Frequency of micturition: Secondary | ICD-10-CM | POA: Diagnosis not present

## 2014-09-07 DIAGNOSIS — R8290 Unspecified abnormal findings in urine: Secondary | ICD-10-CM | POA: Diagnosis not present

## 2014-09-07 DIAGNOSIS — R8299 Other abnormal findings in urine: Secondary | ICD-10-CM | POA: Diagnosis not present

## 2014-09-07 LAB — POCT URINALYSIS DIPSTICK
Bilirubin, UA: NEGATIVE
Blood, UA: NEGATIVE
Glucose, UA: NEGATIVE
Nitrite, UA: NEGATIVE
Spec Grav, UA: 1.025
Urobilinogen, UA: 0.2
pH, UA: 6.5

## 2014-09-07 NOTE — Addendum Note (Signed)
Addended by: Harl Bowie on: 09/07/2014 03:04 PM   Modules accepted: Orders

## 2014-09-09 ENCOUNTER — Telehealth: Payer: Self-pay | Admitting: Family

## 2014-09-09 MED ORDER — CIPROFLOXACIN HCL 250 MG PO TABS: 250.0000 mg | ORAL_TABLET | Freq: Two times a day (BID) | ORAL | Status: DC

## 2014-09-09 NOTE — Telephone Encounter (Signed)
Please contact pt and let her know urine culture is growing bacteria. rx sent to her pharmacy for cipro.

## 2014-09-09 NOTE — Telephone Encounter (Signed)
Reviewed urine culture. Rx sent to pharmacy. Pt notified re: UTI and to start abx.  Verbalizes understanding.

## 2014-09-10 ENCOUNTER — Telehealth: Payer: Self-pay | Admitting: Family

## 2014-09-10 LAB — URINE CULTURE: Colony Count: >=100000

## 2014-09-10 MED ORDER — CEFUROXIME AXETIL 500 MG PO TABS: 500.0000 mg | ORAL_TABLET | Freq: Two times a day (BID) | ORAL | Status: DC

## 2014-09-10 NOTE — Telephone Encounter (Signed)
Please call pt and let her know that her final urine culture sensitivity came back and shows resistance to cipro, the antibiotic I gave her.  She should stop cipro and start ceftin please.

## 2014-09-10 NOTE — Telephone Encounter (Signed)
Notified pt and she voices understanding. 

## 2014-09-26 ENCOUNTER — Other Ambulatory Visit: Payer: Self-pay | Admitting: Family

## 2014-10-13 ENCOUNTER — Other Ambulatory Visit: Payer: Self-pay | Admitting: Family

## 2014-11-09 DIAGNOSIS — G459 Transient cerebral ischemic attack, unspecified: Secondary | ICD-10-CM

## 2014-11-09 HISTORY — DX: Transient cerebral ischemic attack, unspecified: G45.9

## 2014-12-07 ENCOUNTER — Ambulatory Visit (INDEPENDENT_AMBULATORY_CARE_PROVIDER_SITE_OTHER): Payer: Medicare Other | Admitting: Family

## 2014-12-07 ENCOUNTER — Telehealth: Payer: Self-pay | Admitting: Family

## 2014-12-07 ENCOUNTER — Encounter: Payer: Self-pay | Admitting: Family

## 2014-12-07 VITALS — BP 140/80 | HR 68 | Temp 98.1°F | Resp 16 | Ht 66.5 in | Wt 141.2 lb

## 2014-12-07 DIAGNOSIS — D509 Iron deficiency anemia, unspecified: Secondary | ICD-10-CM

## 2014-12-07 DIAGNOSIS — F329 Major depressive disorder, single episode, unspecified: Secondary | ICD-10-CM

## 2014-12-07 DIAGNOSIS — I10 Essential (primary) hypertension: Secondary | ICD-10-CM

## 2014-12-07 DIAGNOSIS — E538 Deficiency of other specified B group vitamins: Secondary | ICD-10-CM | POA: Diagnosis not present

## 2014-12-07 DIAGNOSIS — F32A Depression, unspecified: Secondary | ICD-10-CM

## 2014-12-07 DIAGNOSIS — M069 Rheumatoid arthritis, unspecified: Secondary | ICD-10-CM | POA: Diagnosis not present

## 2014-12-07 DIAGNOSIS — E039 Hypothyroidism, unspecified: Secondary | ICD-10-CM | POA: Diagnosis not present

## 2014-12-07 LAB — BASIC METABOLIC PANEL
BUN: 17 mg/dL (ref 6–23)
CO2: 26 mEq/L (ref 19–32)
Calcium: 9.3 mg/dL (ref 8.4–10.5)
Chloride: 107 mEq/L (ref 96–112)
Creatinine, Ser: 0.72 mg/dL (ref 0.40–1.20)
GFR: 101.91 mL/min (ref >60.00)
Glucose, Bld: 73 mg/dL (ref 70–99)
Potassium: 4.2 mEq/L (ref 3.5–5.1)
Sodium: 142 mEq/L (ref 135–145)

## 2014-12-07 LAB — CBC WITH DIFFERENTIAL/PLATELET
Basophils Absolute: 0 10*3/uL (ref 0.0–0.1)
Basophils Relative: 0.4 % (ref 0.0–3.0)
Eosinophils Absolute: 0.3 10*3/uL (ref 0.0–0.7)
Eosinophils Relative: 2.3 % (ref 0.0–5.0)
HCT: 34.9 % — ABNORMAL LOW (ref 36.0–46.0)
Hemoglobin: 11.1 g/dL — ABNORMAL LOW (ref 12.0–15.0)
Lymphocytes Relative: 29.9 % (ref 12.0–46.0)
Lymphs Abs: 3.3 10*3/uL (ref 0.7–4.0)
MCHC: 31.9 g/dL (ref 30.0–36.0)
MCV: 80.6 fl (ref 78.0–100.0)
Monocytes Absolute: 0.7 10*3/uL (ref 0.1–1.0)
Monocytes Relative: 6.7 % (ref 3.0–12.0)
Neutro Abs: 6.7 10*3/uL (ref 1.4–7.7)
Neutrophils Relative %: 60.7 % (ref 43.0–77.0)
Platelets: 361 10*3/uL (ref 150.0–400.0)
RBC: 4.33 Mil/uL (ref 3.87–5.11)
RDW: 18.3 % — ABNORMAL HIGH (ref 11.5–15.5)
WBC: 11.1 10*3/uL — ABNORMAL HIGH (ref 4.0–10.5)

## 2014-12-07 LAB — TSH: TSH: 177 u[IU]/mL — ABNORMAL HIGH (ref 0.35–4.50)

## 2014-12-07 LAB — IRON: Iron: 35 ug/dL — ABNORMAL LOW (ref 42–145)

## 2014-12-07 MED ORDER — BUPROPION HCL ER (SR) 150 MG PO TB12: 150.0000 mg | ORAL_TABLET | Freq: Two times a day (BID) | ORAL | Status: DC

## 2014-12-07 MED ORDER — METOPROLOL SUCCINATE ER 50 MG PO TB24: ORAL_TABLET | ORAL | Status: DC

## 2014-12-07 NOTE — Progress Notes (Signed)
Pre visit review using our clinic review tool, if applicable. No additional management support is needed unless otherwise documented below in the visit note. 

## 2014-12-07 NOTE — Assessment & Plan Note (Signed)
Stable on wellbutrin, continue same.

## 2014-12-07 NOTE — Assessment & Plan Note (Signed)
Obtain tsh

## 2014-12-07 NOTE — Assessment & Plan Note (Signed)
She would like referral to rheumatologist in [place], will arrange.

## 2014-12-07 NOTE — Telephone Encounter (Signed)
Could you please call and schedule dexa?  Order is in system from September.

## 2014-12-07 NOTE — Progress Notes (Signed)
   Subjective:    [name], female    DOB: 1941-01-25, 74 y.o.   MRN: [id number]  HPI  Patient here for for follow up of multiple medical problems.  Depression- reports good mood on wellbutrin.  Hypothyroid- Feels well on synthroid.  Lab Results  Component Value Date   TSH 12.09* 08/14/2014    HTN-  toprol xl.  BP Readings from Last 3 Encounters:  12/07/14 140/80  09/05/14 124/70  08/14/14 132/66   RA- she has been taking prednisone.  She has not seen Dr. Cloyd Stagers in some time due to issues with her medicaid.    Past Medical History  Diagnosis Date  . Allergy   . Anemia     iron deficiency  . Depression   . Hyperlipidemia   . Hypertension   . Urinary incontinence   . Arthritis     rheumatoid  . Cataract   . Thyroid disease     Review of Systems  Respiratory:       Mild chest congestion. Denies CP, SOB.    Cardiovascular: Negative for leg swelling.       Objective:    Physical Exam  Constitutional: She is oriented to person, place, and time. She appears well-developed and well-nourished.  HENT:  Head: Normocephalic and atraumatic.  Cardiovascular: Normal rate, regular rhythm and normal heart sounds.   No murmur heard. Pulmonary/Chest: Effort normal and breath sounds normal. No respiratory distress. She has no wheezes.  Neurological: She is alert and oriented to person, place, and time.  Psychiatric: She has a normal mood and affect. Her behavior is normal. Judgment and thought content normal.    BP 140/80 mmHg  Pulse 68  Temp(Src) 98.1 F (36.7 C) (Oral)  Resp 16  Ht 5' 6.5" (1.689 m)  Wt 141 lb 3.2 oz (64.048 kg)  BMI 22.45 kg/m2  SpO2 98% Wt Readings from Last 3 Encounters:  12/07/14 141 lb 3.2 oz (64.048 kg)  09/05/14 143 lb (64.864 kg)  08/14/14 142 lb (64.411 kg)     Lab Results  Component Value Date   WBC 8.0 07/25/2014   HGB 11.2* 07/25/2014   HCT 35.2* 07/25/2014   PLT 298.0 07/25/2014   GLUCOSE 103* 07/25/2014     CHOL 213* 07/25/2014   TRIG 135.0 07/25/2014   HDL 44.30 07/25/2014   LDLCALC 142* 07/25/2014   ALT 11 07/25/2014   AST 18 07/25/2014   NA 138 07/25/2014   K 3.6 07/25/2014   CL 104 07/25/2014   CREATININE 0.7 07/25/2014   BUN 18 07/25/2014   CO2 24 07/25/2014   TSH 12.09* 08/14/2014   INR 1.04 01/15/2010    Mm Digital Screening Bilateral  08/01/2014   CLINICAL DATA:  Screening.  EXAM: DIGITAL SCREENING BILATERAL MAMMOGRAM WITH CAD  COMPARISON:  Previous exam(s).  ACR Breast Density Category b: There are scattered areas of fibroglandular density.  FINDINGS: There are no findings suspicious for malignancy. Images were processed with CAD.  IMPRESSION: No mammographic evidence of malignancy. A result letter of this screening mammogram will be mailed directly to the patient.  RECOMMENDATION: Screening mammogram in one year. (Code:SM-B-01Y)  BI-RADS CATEGORY  1: Negative.   Electronically Signed   By: Shon Hale M.D.   On: 08/01/2014 16:23       Assessment & Plan:   Problem List Items Addressed This Visit    None       [name]., NP

## 2014-12-07 NOTE — Assessment & Plan Note (Addendum)
bp stable on current meds.  Obtain bmet.

## 2014-12-07 NOTE — Patient Instructions (Signed)
Please complete lab work prior to leaving.  Follow up in 6 months.  

## 2014-12-07 NOTE — Assessment & Plan Note (Signed)
Not taking iron, obtain iron level and cbc.

## 2014-12-09 ENCOUNTER — Telehealth: Payer: Self-pay | Admitting: Family

## 2014-12-09 DIAGNOSIS — E039 Hypothyroidism, unspecified: Secondary | ICD-10-CM

## 2014-12-09 NOTE — Telephone Encounter (Signed)
Please contact pt and verify that she is taking synthroid. TSH is very abnormal, I would like her to repeat TSH this week to confirm results and I will then change her dose.

## 2014-12-10 NOTE — Telephone Encounter (Signed)
Noted  

## 2014-12-10 NOTE — Telephone Encounter (Signed)
Spoke with pt. She states she may have missed 2-3 days of levothyroxine but no more than that. Advised pt of need to repeat TSH level and lab appt scheduled for 12/11/14 at 1:15pm.  Lab order entered.

## 2014-12-11 ENCOUNTER — Other Ambulatory Visit (INDEPENDENT_AMBULATORY_CARE_PROVIDER_SITE_OTHER): Payer: Medicare Other

## 2014-12-11 DIAGNOSIS — E039 Hypothyroidism, unspecified: Secondary | ICD-10-CM | POA: Diagnosis not present

## 2014-12-12 LAB — TSH: TSH: 188 u[IU]/mL — ABNORMAL HIGH (ref 0.35–4.50)

## 2014-12-13 ENCOUNTER — Other Ambulatory Visit: Payer: Self-pay | Admitting: Family

## 2014-12-13 DIAGNOSIS — E039 Hypothyroidism, unspecified: Secondary | ICD-10-CM

## 2014-12-13 MED ORDER — LEVOTHYROXINE SODIUM 50 MCG PO TABS: 50.0000 ug | ORAL_TABLET | Freq: Every day | ORAL | Status: DC

## 2014-12-13 NOTE — Telephone Encounter (Signed)
Thyroid test still showing that she needs higher dose.  It actually looks like she is not absorbing the thyroid med at all.  Make sure she takes first thing in AM on empty stomach, nothing but water for 30 min following dose.  Increase levothyroxine to 50 mcg once daily, repeat tsh in 6 weeks, dx hypothyroid.

## 2014-12-13 NOTE — Telephone Encounter (Signed)
Pt notified and made aware.  She agrees with plan.  Lab appointment scheduled for 01/24/15 @ 8:30 am. Future TSH ordered.  Rx sent to the pharmacy.

## 2014-12-16 ENCOUNTER — Emergency Department (HOSPITAL_COMMUNITY): Payer: Medicare Other

## 2014-12-16 ENCOUNTER — Encounter (HOSPITAL_COMMUNITY): Payer: Self-pay | Admitting: Emergency Medicine

## 2014-12-16 ENCOUNTER — Observation Stay (HOSPITAL_COMMUNITY)
Admission: EM | Admit: 2014-12-16 | Discharge: 2014-12-18 | Disposition: A | Discharge status: Home / Self Care | Payer: Medicare Other | Attending: Internal Medicine | Admitting: Internal Medicine

## 2014-12-16 DIAGNOSIS — E039 Hypothyroidism, unspecified: Secondary | ICD-10-CM | POA: Insufficient documentation

## 2014-12-16 DIAGNOSIS — R4701 Aphasia: Secondary | ICD-10-CM | POA: Diagnosis not present

## 2014-12-16 DIAGNOSIS — R4789 Other speech disturbances: Secondary | ICD-10-CM | POA: Diagnosis not present

## 2014-12-16 DIAGNOSIS — D509 Iron deficiency anemia, unspecified: Secondary | ICD-10-CM | POA: Diagnosis not present

## 2014-12-16 DIAGNOSIS — F17210 Nicotine dependence, cigarettes, uncomplicated: Secondary | ICD-10-CM | POA: Diagnosis not present

## 2014-12-16 DIAGNOSIS — F329 Major depressive disorder, single episode, unspecified: Secondary | ICD-10-CM | POA: Diagnosis not present

## 2014-12-16 DIAGNOSIS — Z7952 Long term (current) use of systemic steroids: Secondary | ICD-10-CM | POA: Insufficient documentation

## 2014-12-16 DIAGNOSIS — I6789 Other cerebrovascular disease: Secondary | ICD-10-CM | POA: Diagnosis not present

## 2014-12-16 DIAGNOSIS — R030 Elevated blood-pressure reading, without diagnosis of hypertension: Secondary | ICD-10-CM | POA: Diagnosis not present

## 2014-12-16 DIAGNOSIS — R32 Unspecified urinary incontinence: Secondary | ICD-10-CM | POA: Insufficient documentation

## 2014-12-16 DIAGNOSIS — Z79899 Other long term (current) drug therapy: Secondary | ICD-10-CM | POA: Insufficient documentation

## 2014-12-16 DIAGNOSIS — E785 Hyperlipidemia, unspecified: Secondary | ICD-10-CM | POA: Insufficient documentation

## 2014-12-16 DIAGNOSIS — R4781 Slurred speech: Secondary | ICD-10-CM | POA: Diagnosis not present

## 2014-12-16 DIAGNOSIS — I10 Essential (primary) hypertension: Secondary | ICD-10-CM | POA: Diagnosis not present

## 2014-12-16 DIAGNOSIS — R471 Dysarthria and anarthria: Secondary | ICD-10-CM | POA: Diagnosis not present

## 2014-12-16 DIAGNOSIS — M069 Rheumatoid arthritis, unspecified: Secondary | ICD-10-CM | POA: Diagnosis not present

## 2014-12-16 DIAGNOSIS — E079 Disorder of thyroid, unspecified: Secondary | ICD-10-CM | POA: Insufficient documentation

## 2014-12-16 DIAGNOSIS — H269 Unspecified cataract: Secondary | ICD-10-CM | POA: Insufficient documentation

## 2014-12-16 DIAGNOSIS — Z87891 Personal history of nicotine dependence: Secondary | ICD-10-CM | POA: Diagnosis not present

## 2014-12-16 DIAGNOSIS — G459 Transient cerebral ischemic attack, unspecified: Principal | ICD-10-CM | POA: Diagnosis present

## 2014-12-16 LAB — I-STAT CHEM 8, ED
BUN: 10 mg/dL (ref 6–23)
Calcium, Ion: 1.14 mmol/L (ref 1.13–1.30)
Chloride: 108 mmol/L (ref 96–112)
Creatinine, Ser: 0.6 mg/dL (ref 0.50–1.10)
Glucose, Bld: 167 mg/dL — ABNORMAL HIGH (ref 70–99)
HCT: 38 % (ref 36.0–46.0)
Hemoglobin: 12.9 g/dL (ref 12.0–15.0)
Potassium: 3.8 mmol/L (ref 3.5–5.1)
Sodium: 143 mmol/L (ref 135–145)
TCO2: 17 mmol/L (ref 0–100)

## 2014-12-16 LAB — CBC
HCT: 34.2 % — ABNORMAL LOW (ref 36.0–46.0)
Hemoglobin: 11.2 g/dL — ABNORMAL LOW (ref 12.0–15.0)
MCH: 25.9 pg — ABNORMAL LOW (ref 26.0–34.0)
MCHC: 32.7 g/dL (ref 30.0–36.0)
MCV: 79.2 fL (ref 78.0–100.0)
Platelets: 370 10*3/uL (ref 150–400)
RBC: 4.32 MIL/uL (ref 3.87–5.11)
RDW: 17.2 % — ABNORMAL HIGH (ref 11.5–15.5)
WBC: 10.7 10*3/uL — ABNORMAL HIGH (ref 4.0–10.5)

## 2014-12-16 LAB — RAPID URINE DRUG SCREEN, HOSP PERFORMED
Amphetamines: NOT DETECTED
Barbiturates: NOT DETECTED
Benzodiazepines: NOT DETECTED
Cocaine: NOT DETECTED
Opiates: NOT DETECTED
Tetrahydrocannabinol: NOT DETECTED

## 2014-12-16 LAB — URINALYSIS, ROUTINE W REFLEX MICROSCOPIC
Bilirubin Urine: NEGATIVE
Glucose, UA: NEGATIVE mg/dL
Hgb urine dipstick: NEGATIVE
Ketones, ur: NEGATIVE mg/dL
Leukocytes, UA: NEGATIVE
Nitrite: NEGATIVE
Protein, ur: NEGATIVE mg/dL
Specific Gravity, Urine: 1.009 (ref 1.005–1.030)
Urobilinogen, UA: 1 mg/dL (ref 0.0–1.0)
pH: 6 (ref 5.0–8.0)

## 2014-12-16 LAB — COMPREHENSIVE METABOLIC PANEL
ALT: 10 U/L (ref 0–35)
AST: 21 U/L (ref 0–37)
Albumin: 3.3 g/dL — ABNORMAL LOW (ref 3.5–5.2)
Alkaline Phosphatase: 55 U/L (ref 39–117)
Anion gap: 9 (ref 5–15)
BUN: 10 mg/dL (ref 6–23)
CO2: 22 mmol/L (ref 19–32)
Calcium: 8.9 mg/dL (ref 8.4–10.5)
Chloride: 110 mmol/L (ref 96–112)
Creatinine, Ser: 0.8 mg/dL (ref 0.50–1.10)
GFR calc Af Amer: 83 mL/min — ABNORMAL LOW (ref >90)
GFR calc non Af Amer: 71 mL/min — ABNORMAL LOW (ref >90)
Glucose, Bld: 170 mg/dL — ABNORMAL HIGH (ref 70–99)
Potassium: 3.8 mmol/L (ref 3.5–5.1)
Sodium: 141 mmol/L (ref 135–145)
Total Bilirubin: 0.4 mg/dL (ref 0.3–1.2)
Total Protein: 7.1 g/dL (ref 6.0–8.3)

## 2014-12-16 LAB — PROTIME-INR
INR: 1.09 (ref 0.00–1.49)
Prothrombin Time: 14.2 seconds (ref 11.6–15.2)

## 2014-12-16 LAB — DIFFERENTIAL
Basophils Absolute: 0 10*3/uL (ref 0.0–0.1)
Basophils Relative: 0 % (ref 0–1)
Eosinophils Absolute: 0.1 10*3/uL (ref 0.0–0.7)
Eosinophils Relative: 1 % (ref 0–5)
Lymphocytes Relative: 13 % (ref 12–46)
Lymphs Abs: 1.4 10*3/uL (ref 0.7–4.0)
Monocytes Absolute: 0.2 10*3/uL (ref 0.1–1.0)
Monocytes Relative: 2 % — ABNORMAL LOW (ref 3–12)
Neutro Abs: 8.9 10*3/uL — ABNORMAL HIGH (ref 1.7–7.7)
Neutrophils Relative %: 84 % — ABNORMAL HIGH (ref 43–77)

## 2014-12-16 LAB — I-STAT TROPONIN, ED: Troponin i, poc: 0 ng/mL (ref 0.00–0.08)

## 2014-12-16 LAB — CBG MONITORING, ED: Glucose-Capillary: 184 mg/dL — ABNORMAL HIGH (ref 70–99)

## 2014-12-16 LAB — APTT: aPTT: 25 seconds (ref 24–37)

## 2014-12-16 LAB — ETHANOL: Alcohol, Ethyl (B): <5 mg/dL (ref 0–9)

## 2014-12-16 MED ORDER — PANTOPRAZOLE SODIUM 40 MG PO TBEC
40.0000 mg | DELAYED_RELEASE_TABLET | Freq: Every day | ORAL | Status: DC
  Administered 2014-12-17 – 2014-12-18 (×2): 40 mg via ORAL
  Filled 2014-12-16 (×2): qty 1

## 2014-12-16 MED ORDER — ACETAMINOPHEN 325 MG PO TABS: 650.0000 mg | ORAL_TABLET | ORAL | Status: DC | PRN

## 2014-12-16 MED ORDER — ENOXAPARIN SODIUM 30 MG/0.3ML ~~LOC~~ SOLN
30.0000 mg | Freq: Every day | SUBCUTANEOUS | Status: DC
  Administered 2014-12-17 – 2014-12-18 (×2): 30 mg via SUBCUTANEOUS
  Filled 2014-12-16 (×2): qty 0.3

## 2014-12-16 MED ORDER — METOPROLOL SUCCINATE ER 25 MG PO TB24
50.0000 mg | ORAL_TABLET | Freq: Every day | ORAL | Status: DC
Start: 2014-12-17 — End: 2014-12-18
  Administered 2014-12-17 – 2014-12-18 (×2): 50 mg via ORAL
  Filled 2014-12-16 (×2): qty 2

## 2014-12-16 MED ORDER — PREDNISONE 5 MG PO TABS
5.0000 mg | ORAL_TABLET | Freq: Two times a day (BID) | ORAL | Status: DC
  Administered 2014-12-17 – 2014-12-18 (×3): 5 mg via ORAL
  Filled 2014-12-16 (×3): qty 1

## 2014-12-16 MED ORDER — LORATADINE 10 MG PO TABS
10.0000 mg | ORAL_TABLET | Freq: Every day | ORAL | Status: DC
Start: 2014-12-17 — End: 2014-12-18
  Administered 2014-12-17 – 2014-12-18 (×2): 10 mg via ORAL
  Filled 2014-12-16 (×2): qty 1

## 2014-12-16 MED ORDER — ASPIRIN 325 MG PO TABS
325.0000 mg | ORAL_TABLET | Freq: Every day | ORAL | Status: DC
  Administered 2014-12-17 – 2014-12-18 (×2): 325 mg via ORAL
  Filled 2014-12-16 (×2): qty 1

## 2014-12-16 MED ORDER — STROKE: EARLY STAGES OF RECOVERY BOOK
Freq: Once | Status: AC
  Administered 2014-12-17: 02:00:00

## 2014-12-16 MED ORDER — ASPIRIN 81 MG PO CHEW
324.0000 mg | CHEWABLE_TABLET | Freq: Once | ORAL | Status: AC
  Administered 2014-12-16: 324 mg via ORAL
  Filled 2014-12-16: qty 4

## 2014-12-16 MED ORDER — LEVOTHYROXINE SODIUM 50 MCG PO TABS
50.0000 ug | ORAL_TABLET | Freq: Every day | ORAL | Status: DC
  Administered 2014-12-17 – 2014-12-18 (×2): 50 ug via ORAL
  Filled 2014-12-16 (×2): qty 1

## 2014-12-16 MED ORDER — FOLIC ACID 1 MG PO TABS
1.0000 mg | ORAL_TABLET | Freq: Every day | ORAL | Status: DC
  Administered 2014-12-17 – 2014-12-18 (×2): 1 mg via ORAL
  Filled 2014-12-16 (×2): qty 1

## 2014-12-16 NOTE — ED Notes (Signed)
Dr. Reather Converse called to bedside for difficulty speaking

## 2014-12-16 NOTE — ED Notes (Signed)
Dr. Nicole Kindred, neurology, at the bedside.

## 2014-12-16 NOTE — ED Notes (Signed)
Dr. Reather Converse called from CT, mild seizure activity involving head only. MD acknowledges, comes to see the patient.

## 2014-12-16 NOTE — ED Notes (Signed)
Per EMS, patient was at daughter's house with sudden onset of difficulty speaking recently. Last known well was 1 hour ago. No hx of stroke. bp 200/140's. Dr. Reather Converse called to bedside.

## 2014-12-16 NOTE — ED Provider Notes (Signed)
CSN: [id number]     Arrival date & time 12/16/14  2054 History   First MD Initiated Contact with Patient 12/16/14 2101     Chief Complaint  Patient presents with  . Code Stroke     (Consider location/radiation/quality/duration/timing/severity/associated sxs/prior Treatment) HPI Comments: 74 year old female with history of tobacco abuse, high blood pressure, memory loss anemia, rheumatoid arthritis presents with dysarthria and expressive aphasia that started approximately one hour prior to arrival. Witnessed by family members. No head injury however patient did fall and had mild balance issues followed by new speech changes. No history of stroke, no significant blood thinner use.  Patient did not have any significant alcohol or new medications recently. Symptoms constant.  The history is provided by the patient.    Past Medical History  Diagnosis Date  . Allergy   . Anemia     iron deficiency  . Depression   . Hyperlipidemia   . Hypertension   . Urinary incontinence   . Arthritis     rheumatoid  . Cataract   . Thyroid disease    Past Surgical History  Procedure Laterality Date  . Abdominal hysterectomy  1976  . Tumor removal  last was 1976    left ankle x3   Family History  Problem Relation Age of Onset  . Hypertension Father   . Heart disease Sister 58    CABG x 3  . Cirrhosis Brother   . Alcohol abuse Brother   . Colitis Daughter   . Colon polyps Daughter   . Hypertension Son   . Kidney disease Son     transplant due to kidney problems after [facility]  . Arthritis Other   . Coronary artery disease Other   . Hyperlipidemia Other   . Hypertension Other   . Stroke Other   . Arthritis Sister   . Hypertension Sister   . Thyroid disease Daughter   . Thyroid disease Daughter   . Thyroid disease Daughter    History  Substance Use Topics  . Smoking status: Current Every Day Smoker -- 32 years    Types: E-cigarettes  . Smokeless tobacco: Never Used     Comment:  2-4 cigarettes a day.  . Alcohol Use: No   OB History    No data available     Review of Systems  Constitutional: Negative for fever and chills.  HENT: Negative for congestion.   Eyes: Negative for visual disturbance.  Respiratory: Negative for shortness of breath.   Cardiovascular: Negative for chest pain.  Gastrointestinal: Negative for vomiting and abdominal pain.  Genitourinary: Negative for dysuria and flank pain.  Musculoskeletal: Negative for back pain, neck pain and neck stiffness.  Skin: Negative for rash.  Neurological: Positive for speech difficulty. Negative for seizures, syncope, weakness, light-headedness and headaches.      Allergies  Review of patient's allergies indicates no known allergies.  Home Medications   Prior to Admission medications   Medication Sig Start Date End Date Taking? Authorizing Provider  buPROPion (WELLBUTRIN SR) 150 MG 12 hr tablet Take 1 tablet (150 mg total) by mouth 2 (two) times daily. Patient taking differently: Take 150 mg by mouth daily.  12/07/14  Yes Debbrah Alar, NP  folic acid (FOLVITE) 1 MG tablet Take 1 mg by mouth daily.    Yes Historical Provider, MD  ibuprofen (ADVIL,MOTRIN) 800 MG tablet Take 800 mg by mouth every 8 (eight) hours as needed. For pain   Yes Historical Provider, MD  Ibuprofen 200  MG CAPS Take 400 mg by mouth as needed (for pain).   Yes Historical Provider, MD  levothyroxine (SYNTHROID, LEVOTHROID) 50 MCG tablet Take 1 tablet (50 mcg total) by mouth daily. 12/13/14  Yes Debbrah Alar, NP  loratadine (CLARITIN) 10 MG tablet Take 10 mg by mouth daily as needed. For allergies    Yes Historical Provider, MD  metoprolol succinate (TOPROL-XL) 50 MG 24 hr tablet TAKE 1 TABLET BY MOUTH EVERY DAY WITH OR IMMEDIATELY FOLLOWING A MEAL 12/07/14  Yes Debbrah Alar, NP  predniSONE (DELTASONE) 5 MG tablet Take 5 mg by mouth 2 (two) times daily with a meal.  09/17/12  Yes Nevada Crane, MD   BP 148/65 mmHg  Pulse 58   Temp(Src) 98 F (36.7 C) (Oral)  Resp 18  Ht 5\' 6"  (1.676 m)  Wt 65 lb 2 oz (29.541 kg)  BMI 10.52 kg/m2  SpO2 98% Physical Exam  Constitutional: She is oriented to person, place, and time. She appears well-developed and well-nourished.  HENT:  Head: Normocephalic and atraumatic.  Eyes: Conjunctivae are normal. Right eye exhibits no discharge. Left eye exhibits no discharge.  Neck: Normal range of motion. Neck supple. No tracheal deviation present.  Cardiovascular: Normal rate and regular rhythm.   Pulmonary/Chest: Effort normal and breath sounds normal.  Abdominal: Soft. She exhibits no distension. There is no tenderness. There is no guarding.  Musculoskeletal: She exhibits no edema.  Neurological: She is alert and oriented to person, place, and time. GCS eye subscore is 4. GCS verbal subscore is 5. GCS motor subscore is 6.  Patient has expressive aphasia and mild dysarthria. Patient has no obvious facial droop, no arm or leg drift, sensation intact grossly bilateral upper lower extremities, equal 5+ strength upper and lower extremities finger nose intact, gross vision intact each eye.  Skin: Skin is warm. No rash noted.  Psychiatric: She has a normal mood and affect.  Nursing note and vitals reviewed.   ED Course  Procedures (including critical care time) Labs Review Labs Reviewed  CBC - Abnormal; Notable for the following:    WBC 10.7 (*)    Hemoglobin 11.2 (*)    HCT 34.2 (*)    MCH 25.9 (*)    RDW 17.2 (*)    All other components within normal limits  DIFFERENTIAL - Abnormal; Notable for the following:    Neutrophils Relative % 84 (*)    Neutro Abs 8.9 (*)    Monocytes Relative 2 (*)    All other components within normal limits  COMPREHENSIVE METABOLIC PANEL - Abnormal; Notable for the following:    Glucose, Bld 170 (*)    Albumin 3.3 (*)    GFR calc non Af Amer 71 (*)    GFR calc Af Amer 83 (*)    All other components within normal limits  LIPID PANEL -  Abnormal; Notable for the following:    LDL Cholesterol 121 (*)    All other components within normal limits  GLUCOSE, CAPILLARY - Abnormal; Notable for the following:    Glucose-Capillary 116 (*)    All other components within normal limits  I-STAT CHEM 8, ED - Abnormal; Notable for the following:    Glucose, Bld 167 (*)    All other components within normal limits  CBG MONITORING, ED - Abnormal; Notable for the following:    Glucose-Capillary 184 (*)    All other components within normal limits  ETHANOL  PROTIME-INR  APTT  URINE RAPID DRUG SCREEN (HOSP  PERFORMED)  URINALYSIS, ROUTINE W REFLEX MICROSCOPIC  HEMOGLOBIN A1C  GLUCOSE, CAPILLARY  GLUCOSE, CAPILLARY  GLUCOSE, CAPILLARY  I-STAT TROPOININ, ED  I-STAT TROPOININ, ED    Imaging Review Ct Head Wo Contrast  12/16/2014   CLINICAL DATA:  Sudden onset difficulty speaking recently. Last known well 1 hr ago. No previous history of stroke. Elevated blood pressure.  EXAM: CT HEAD WITHOUT CONTRAST  TECHNIQUE: Contiguous axial images were obtained from the base of the skull through the vertex without intravenous contrast.  COMPARISON:  CT sinuses 12/30/2009  FINDINGS: Diffuse cerebral atrophy. Mild low-attenuation changes in the deep white matter suggesting mild small vessel ischemic change. No ventricular dilatation. No mass effect or midline shift. No abnormal extra-axial fluid collections. Gray-white matter junctions are distinct. Basal cisterns are not effaced. No evidence of acute intracranial hemorrhage. No depressed skull fractures. Visualized paranasal sinuses and mastoid air cells are not opacified.  IMPRESSION: No acute intracranial abnormalities. Mild atrophy and small vessel ischemic change.  These results were called by telephone at the time of interpretation on 12/16/2014 at 9:20 pm to Dr. Elnora Morrison , who verbally acknowledged these results.   Electronically Signed   By: Lucienne Capers M.D.   On: 12/16/2014 21:22   Mri Brain  Without Contrast  12/17/2014   CLINICAL DATA:  Sudden onset of difficulty speaking 12/16/2014. Symptoms have now resolved. Hypertension.  EXAM: MRI HEAD WITHOUT CONTRAST  MRA HEAD WITHOUT CONTRAST  TECHNIQUE: Multiplanar, multiecho pulse sequences of the brain and surrounding structures were obtained without intravenous contrast. Angiographic images of the head were obtained using MRA technique without contrast.  COMPARISON:  CT head 12/16/2014.  FINDINGS: MRI HEAD FINDINGS  No evidence for acute infarction, hemorrhage, mass lesion, hydrocephalus, or extra-axial fluid. Normal for age cerebral volume. Mild subcortical and periventricular T2 and FLAIR hyperintensities, likely chronic microvascular ischemic change. Flow voids are maintained throughout the carotid, basilar, and vertebral arteries. There are no areas of chronic hemorrhage. Pituitary, pineal, and cerebellar tonsils unremarkable. Mild pannus surrounds the odontoid. Mild stenosis suspected at C3-4 due to slip and degenerative disc disease. Visualized calvarium, skull base, and upper cervical osseous structures unremarkable. Scalp and extracranial soft tissues, orbits, sinuses, and mastoids show no acute process.  MRA HEAD FINDINGS  Mild dolichoectasia. The internal carotid arteries are widely patent. The basilar artery is widely patent with both vertebrals contributing.  Minor irregularity of the A1 segment RIGHT anterior cerebral artery. Focal narrowing of the mid M1 segment RIGHT middle cerebral artery estimated 50%. No proximal LEFT M1 or proximal PCA disease. No cerebellar branch occlusion. Minor irregularity of the MCA branches distal to the trifurcation suggesting intracranial atherosclerotic change.  IMPRESSION: No acute intracranial findings.  Good general agreement recent CT.  Mild chronic microvascular ischemic change.  Intracranial atherosclerotic change as described, most notable in the RIGHT M1 MCA where a 50% stenosis is suspected.  Mild  pannus.  Cervical stenosis suspect at C3-C4.   Electronically Signed   By: Rolla Flatten M.D.   On: 12/17/2014 07:17   Mr Jodene Nam Head/brain Wo Cm  12/17/2014   CLINICAL DATA:  Sudden onset of difficulty speaking 12/16/2014. Symptoms have now resolved. Hypertension.  EXAM: MRI HEAD WITHOUT CONTRAST  MRA HEAD WITHOUT CONTRAST  TECHNIQUE: Multiplanar, multiecho pulse sequences of the brain and surrounding structures were obtained without intravenous contrast. Angiographic images of the head were obtained using MRA technique without contrast.  COMPARISON:  CT head 12/16/2014.  FINDINGS: MRI HEAD FINDINGS  No evidence for  acute infarction, hemorrhage, mass lesion, hydrocephalus, or extra-axial fluid. Normal for age cerebral volume. Mild subcortical and periventricular T2 and FLAIR hyperintensities, likely chronic microvascular ischemic change. Flow voids are maintained throughout the carotid, basilar, and vertebral arteries. There are no areas of chronic hemorrhage. Pituitary, pineal, and cerebellar tonsils unremarkable. Mild pannus surrounds the odontoid. Mild stenosis suspected at C3-4 due to slip and degenerative disc disease. Visualized calvarium, skull base, and upper cervical osseous structures unremarkable. Scalp and extracranial soft tissues, orbits, sinuses, and mastoids show no acute process.  MRA HEAD FINDINGS  Mild dolichoectasia. The internal carotid arteries are widely patent. The basilar artery is widely patent with both vertebrals contributing.  Minor irregularity of the A1 segment RIGHT anterior cerebral artery. Focal narrowing of the mid M1 segment RIGHT middle cerebral artery estimated 50%. No proximal LEFT M1 or proximal PCA disease. No cerebellar branch occlusion. Minor irregularity of the MCA branches distal to the trifurcation suggesting intracranial atherosclerotic change.  IMPRESSION: No acute intracranial findings.  Good general agreement recent CT.  Mild chronic microvascular ischemic change.   Intracranial atherosclerotic change as described, most notable in the RIGHT M1 MCA where a 50% stenosis is suspected.  Mild pannus.  Cervical stenosis suspect at C3-C4.   Electronically Signed   By: Rolla Flatten M.D.   On: 12/17/2014 07:17     EKG Interpretation   Date/Time:  Sunday December 16 2014 21:29:36 EST Ventricular Rate:  68 PR Interval:  125 QRS Duration: 80 QT Interval:  417 QTC Calculation: 443 R Axis:   80 Text Interpretation:  Sinus rhythm Atrial premature complex Nonspecific T  abnrm, anterolateral leads Confirmed by Karelly Dewalt  MD, Zamirah Denny ([id number]) on  12/16/2014 9:35:16 PM      MDM   Final diagnoses:  TIA (transient ischemic attack)  Transient cerebral ischemia, unspecified transient cerebral ischemia type  Essential hypertension  Expressive aphasia  Hyperlipidemia   Patient with vascular risk factors presents with acute expressive aphasia and dysarthria. Concern for acute stroke. Code stroke paged immediately. Labs and CT imaging pending.   The patients results and plan were reviewed and discussed.   Any x-rays performed were personally reviewed by myself.   Differential diagnosis were considered with the presenting HPI.  Medications  loratadine (CLARITIN) tablet 10 mg (10 mg Oral Given 12/17/14 1118)  metoprolol succinate (TOPROL-XL) 24 hr tablet 50 mg (50 mg Oral Given 12/17/14 1116)  predniSONE (DELTASONE) tablet 5 mg (5 mg Oral Given 05/11/52 2992)  folic acid (FOLVITE) tablet 1 mg (1 mg Oral Given 12/17/14 1118)  levothyroxine (SYNTHROID, LEVOTHROID) tablet 50 mcg (50 mcg Oral Given 12/17/14 0811)  enoxaparin (LOVENOX) injection 30 mg (30 mg Subcutaneous Given 12/17/14 1119)  aspirin tablet 325 mg (325 mg Oral Given 12/17/14 1118)  acetaminophen (TYLENOL) tablet 650 mg (not administered)  pantoprazole (PROTONIX) EC tablet 40 mg (40 mg Oral Given 12/17/14 1118)  atorvastatin (LIPITOR) tablet 10 mg (10 mg Oral Given 12/17/14 1705)  aspirin chewable tablet 324 mg (324 mg Oral  Given 12/16/14 2152)   stroke: mapping our early stages of recovery book ( Does not apply Given 12/17/14 0211)  polyethylene glycol (MIRALAX / GLYCOLAX) packet 17 g (17 g Oral Given 12/17/14 1118)    Filed Vitals:   12/17/14 1725 12/17/14 2212 12/18/14 0230 12/18/14 0531  BP: 124/57 128/49 136/65 148/65  Pulse: 64 65 63 58  Temp: 98.4 F (36.9 C) 98.6 F (37 C) 97.9 F (36.6 C) 98 F (36.7 C)  TempSrc: Oral Oral  Oral Oral  Resp: 16 18 18 18   Height:      Weight:      SpO2: 96% 98% 99% 98%    Final diagnoses:  TIA (transient ischemic attack)  Transient cerebral ischemia, unspecified transient cerebral ischemia type  Essential hypertension  Expressive aphasia  Hyperlipidemia    Admission/ observation were discussed with the admitting physician, patient and/or family and they are comfortable with the plan.       Mariea Clonts, MD 12/18/14 [phone]

## 2014-12-16 NOTE — ED Notes (Signed)
Attempted to call report

## 2014-12-16 NOTE — Consult Note (Signed)
Admission H&P    Chief Complaint: New onset speech difficulty.  HPI: Nancy [name] is an 74 y.o. female with a history of depression, hyperlipidemia, hypertension and hypothyroidism presenting with acute speech output abnormality which started at 8 PM tonight. There's no previous history of stroke nor TIA. She has not been on antiplatelet therapy. She had no associated focal weakness or numbness. She was able to understand what was being said to her. CT scan of the head showed no acute intracranial abnormality. NIH stroke score was 1 with residual minimal speech hesitancy.  LSN: 8 PM on 12/16/2014 tPA Given: No: Resolving deficits mRankin:  Past Medical History  Diagnosis Date  . Allergy   . Anemia     iron deficiency  . Depression   . Hyperlipidemia   . Hypertension   . Urinary incontinence   . Arthritis     rheumatoid  . Cataract   . Thyroid disease     Past Surgical History  Procedure Laterality Date  . Abdominal hysterectomy  1976  . 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 Hypertension Father   . Heart disease Sister 46    CABG x 3  . Cirrhosis Brother   . Alcohol abuse Brother   . Colitis Daughter   . Colon polyps Daughter   . Hypertension Son   . Kidney disease Son     transplant due to kidney problems after [facility]  . Arthritis Other   . Coronary artery disease Other   . Hyperlipidemia Other   . Hypertension Other   . Stroke Other   . Arthritis Sister   . Hypertension Sister   . Thyroid disease Daughter   . Thyroid disease Daughter   . Thyroid disease Daughter    Social History:  reports that she has been smoking E-cigarettes.  She has smoked for the past 32 years. She has never used smokeless tobacco. She reports that she does not drink alcohol or use illicit drugs.  Allergies: No Known Allergies  Medications: Preadmission medications were reviewed by me.  ROS: History obtained from  patient's family members  General ROS: Recent episodes of feeling generally weak and lightheaded Psychological ROS: negative for - behavioral disorder, hallucinations, memory difficulties, mood swings or suicidal ideation Ophthalmic ROS: negative for - blurry vision, double vision, eye pain or loss of vision ENT ROS: negative for - epistaxis, nasal discharge, oral lesions, sore throat, tinnitus or vertigo Allergy and Immunology ROS: negative for - hives or itchy/watery eyes Hematological and Lymphatic ROS: negative for - bleeding problems, bruising or swollen lymph nodes Endocrine ROS: negative for - galactorrhea, hair pattern changes, polydipsia/polyuria or temperature intolerance Respiratory ROS: negative for - cough, hemoptysis, shortness of breath or wheezing Cardiovascular ROS: negative for - chest pain, dyspnea on exertion, edema or irregular heartbeat Gastrointestinal ROS: negative for - abdominal pain, diarrhea, hematemesis, nausea/vomiting or stool incontinence Genito-Urinary ROS: negative for - dysuria, hematuria, incontinence or urinary frequency/urgency Musculoskeletal ROS: negative for - joint swelling or muscular weakness Neurological ROS: as noted in HPI Dermatological ROS: negative for rash and skin lesion changes  Physical Examination: Blood pressure 169/72, pulse 76, temperature 98.4 F (36.9 C), temperature source Oral, resp. rate 14, height 5\' 6"  (1.676 m), weight 29.541 kg (65 lb 2 oz), SpO2 100 %.  HEENT-  Normocephalic, no lesions, without obvious abnormality.  Normal external eye and conjunctiva.  Normal  TM's bilaterally.  Normal auditory canals and external ears. Normal external nose, mucus membranes and septum.  Normal pharynx. Neck supple with no masses, nodes, nodules or enlargement. Cardiovascular - regular rate and rhythm, S1, S2 normal, no murmur, click, rub or gallop Extremities - no joint deformities, effusion, or inflammation, no edema and no skin  discoloration  Neurologic Examination: Mental Status: Alert, oriented, moderately anxious.  Speech moderately hesitant speech output. Able to follow commands without difficulty. Cranial Nerves: II-Visual fields were normal. III/IV/VI-Pupils were equal and reacted. Extraocular movements were full and conjugate.    V/VII-no facial numbness and no facial weakness. VIII-normal. X-no dysarthria; symmetrical palatal movement. XII-midline tongue extension Motor: 5/5 bilaterally with normal tone and bulk Sensory: Normal throughout. Deep Tendon Reflexes: 2+ and brisk and symmetric. Plantars: Flexor bilaterally Cerebellar: Normal finger-to-nose testing. Carotid auscultation: Normal  Results for orders placed or performed during the hospital encounter of 12/16/14 (from the past 48 hour(s))  Protime-INR     Status: None   Collection Time: 12/16/14  9:02 PM  Result Value Ref Range   Prothrombin Time 14.2 11.6 - 15.2 seconds   INR 1.09 0.00 - 1.49  APTT     Status: None   Collection Time: 12/16/14  9:02 PM  Result Value Ref Range   aPTT 25 24 - 37 seconds  CBC     Status: Abnormal   Collection Time: 12/16/14  9:02 PM  Result Value Ref Range   WBC 10.7 (H) 4.0 - 10.5 K/uL   RBC 4.32 3.87 - 5.11 MIL/uL   Hemoglobin 11.2 (L) 12.0 - 15.0 g/dL   HCT 34.2 (L) 36.0 - 46.0 %   MCV 79.2 78.0 - 100.0 fL   MCH 25.9 (L) 26.0 - 34.0 pg   MCHC 32.7 30.0 - 36.0 g/dL   RDW 17.2 (H) 11.5 - 15.5 %   Platelets 370 150 - 400 K/uL  Differential     Status: Abnormal   Collection Time: 12/16/14  9:02 PM  Result Value Ref Range   Neutrophils Relative % 84 (H) 43 - 77 %   Neutro Abs 8.9 (H) 1.7 - 7.7 K/uL   Lymphocytes Relative 13 12 - 46 %   Lymphs Abs 1.4 0.7 - 4.0 K/uL   Monocytes Relative 2 (L) 3 - 12 %   Monocytes Absolute 0.2 0.1 - 1.0 K/uL   Eosinophils Relative 1 0 - 5 %   Eosinophils Absolute 0.1 0.0 - 0.7 K/uL   Basophils Relative 0 0 - 1 %   Basophils Absolute 0.0 0.0 - 0.1 K/uL  CBG  monitoring, ED     Status: Abnormal   Collection Time: 12/16/14  9:03 PM  Result Value Ref Range   Glucose-Capillary 184 (H) 70 - 99 mg/dL  I-Stat Troponin, ED (not at [facility])     Status: None   Collection Time: 12/16/14  9:17 PM  Result Value Ref Range   Troponin i, poc 0.00 0.00 - 0.08 ng/mL   Comment 3            Comment: Due to the release kinetics of cTnI, a negative result within the first hours of the onset of symptoms does not rule out myocardial infarction with certainty. If myocardial infarction is still suspected, repeat the test at appropriate intervals.   I-Stat Chem 8, ED     Status: Abnormal   Collection Time: 12/16/14  9:18 PM  Result Value Ref Range   Sodium 143 135 - 145 mmol/L  Potassium 3.8 3.5 - 5.1 mmol/L   Chloride 108 96 - 112 mmol/L   BUN 10 6 - 23 mg/dL   Creatinine, Ser 0.60 0.50 - 1.10 mg/dL   Glucose, Bld 167 (H) 70 - 99 mg/dL   Calcium, Ion 1.14 1.13 - 1.30 mmol/L   TCO2 17 0 - 100 mmol/L   Hemoglobin 12.9 12.0 - 15.0 g/dL   HCT 38.0 36.0 - 46.0 %   Ct Head Wo Contrast  12/16/2014   CLINICAL DATA:  Sudden onset difficulty speaking recently. Last known well 1 hr ago. No previous history of stroke. Elevated blood pressure.  EXAM: CT HEAD WITHOUT CONTRAST  TECHNIQUE: Contiguous axial images were obtained from the base of the skull through the vertex without intravenous contrast.  COMPARISON:  CT sinuses 12/30/2009  FINDINGS: Diffuse cerebral atrophy. Mild low-attenuation changes in the deep white matter suggesting mild small vessel ischemic change. No ventricular dilatation. No mass effect or midline shift. No abnormal extra-axial fluid collections. Gray-white matter junctions are distinct. Basal cisterns are not effaced. No evidence of acute intracranial hemorrhage. No depressed skull fractures. Visualized paranasal sinuses and mastoid air cells are not opacified.  IMPRESSION: No acute intracranial abnormalities. Mild atrophy and small vessel ischemic change.   These results were called by telephone at the time of interpretation on 12/16/2014 at 9:20 pm to Dr. Elnora Morrison , who verbally acknowledged these results.   Electronically Signed   By: Lucienne Capers M.D.   On: 12/16/2014 21:22    Assessment: 74 y.o. female with multiple risk factors for stroke presenting with probable subcortical TIA.  Stroke Risk Factors - diabetes mellitus, family history, hyperlipidemia and hypertension  Plan: 1. HgbA1c, fasting lipid panel 2. MRI, MRA  of the brain without contrast 3. PT consult, OT consult, Speech consult 4. Echocardiogram 5. Carotid dopplers 6. Prophylactic therapy-Antiplatelet med: Aspirin  7. Risk factor modification 8. Telemetry monitoring   C.R. Nicole Kindred, MD [facility] [phone]  12/16/2014, 9:51 PM

## 2014-12-16 NOTE — H&P (Signed)
[facility] Admission History and Physical       Nancy [name] UYQ:[id number] DOB: 18-Jun-1941 DOA: 12/16/2014  Referring physician:  PCP: [name]., NP  Specialists:   Chief Complaint: Problems with Speech  HPI: Nancy [name] is a 74 y.o. female with a history of Rheumatoid Arthritis, HTN,  Hyperlipidemia, and Hypothyroidism who presents to the ED with complaints of sudden onset of difficulty speaking.  Her symptoms began 8 PM, 1 hour prior to arrival to the ED.   She reports having headaches off and on this past week.    Her Blood Pressure was found to be 200/140's.    A Ct scan of the head was performred and was negative for acute findings.  Her symptoms resolved while in the ED.    She was seen by Neuro Dr. Nicole Kindred in the ED.     Review of Systems: Unable to Obtain from the Patient  Constitutional: No Weight Loss, No Weight Gain, Night Sweats, Fevers, Chills, Dizziness, Light Headedness, Fatigue, or Generalized Weakness HEENT:  +Headaches, Difficulty Swallowing,Tooth/Dental Problems,Sore Throat,  No Sneezing, Rhinitis, Ear Ache, Nasal Congestion, or Post Nasal Drip,  Cardio-vascular:  No Chest pain, Orthopnea, PND, Edema in Lower Extremities, Anasarca, Dizziness, Palpitations  Resp: No Dyspnea, No DOE, No Productive Cough, No Non-Productive Cough, No Hemoptysis, No Wheezing.    GI: No Heartburn, Indigestion, Abdominal Pain, Nausea, Vomiting, Diarrhea, Constipation, Hematemesis, Hematochezia, Melena, Change in Bowel Habits,  Loss of Appetite  GU: No Dysuria, No Change in Color of Urine, No Urgency or Urinary Frequency, No Flank pain.  Musculoskeletal: +Chronic RA changes of Hands, No Decreased Range of Motion, No Back Pain.  Neurologic: No Syncope, +Transient Expressive Aphasia, No Seizures, Muscle Weakness, Paresthesia, Vision Disturbance or Loss, No Diplopia, No Vertigo, No Difficulty Walking,  Skin: No Rash or Lesions. Psych: No Change in Mood or Affect, No  Depression or Anxiety, No Memory loss, No Confusion, or Hallucinations   Past Medical History  Diagnosis Date  . Allergy   . Anemia     iron deficiency  . Depression   . Hyperlipidemia   . Hypertension   . Urinary incontinence   . Arthritis     rheumatoid  . Cataract   . Thyroid disease      Past Surgical History  Procedure Laterality Date  . Abdominal hysterectomy  1976  . Tumor removal  last was 1976    left ankle x3      Prior to Admission medications   Medication Sig Start Date End Date Taking? Authorizing Provider  buPROPion (WELLBUTRIN SR) 150 MG 12 hr tablet Take 1 tablet (150 mg total) by mouth 2 (two) times daily. Patient taking differently: Take 150 mg by mouth daily.  12/07/14  Yes Debbrah Alar, NP  folic acid (FOLVITE) 1 MG tablet Take 1 mg by mouth daily.    Yes Historical Provider, MD  ibuprofen (ADVIL,MOTRIN) 800 MG tablet Take 800 mg by mouth every 8 (eight) hours as needed. For pain   Yes Historical Provider, MD  Ibuprofen 200 MG CAPS Take 400 mg by mouth as needed (for pain).   Yes Historical Provider, MD  levothyroxine (SYNTHROID, LEVOTHROID) 50 MCG tablet Take 1 tablet (50 mcg total) by mouth daily. 12/13/14  Yes Debbrah Alar, NP  loratadine (CLARITIN) 10 MG tablet Take 10 mg by mouth daily as needed. For allergies    Yes Historical Provider, MD  metoprolol succinate (TOPROL-XL) 50 MG 24 hr tablet TAKE 1 TABLET BY MOUTH  EVERY DAY WITH OR IMMEDIATELY FOLLOWING A MEAL 12/07/14  Yes Debbrah Alar, NP  predniSONE (DELTASONE) 5 MG tablet Take 5 mg by mouth 2 (two) times daily with a meal.  09/17/12  Yes Nevada Crane, MD     No Known Allergies  Social History:  reports that she has been smoking E-cigarettes.  She has smoked for the past 32 years. She has never used smokeless tobacco. She reports that she does not drink alcohol or use illicit drugs.    Family History  Problem Relation Age of Onset  . Hypertension Father   . Heart disease Sister  70    CABG x 3  . Cirrhosis Brother   . Alcohol abuse Brother   . Colitis Daughter   . Colon polyps Daughter   . Hypertension Son   . Kidney disease Son     transplant due to kidney problems after [facility]  . Arthritis Other   . Coronary artery disease Other   . Hyperlipidemia Other   . Hypertension Other   . Stroke Other   . Arthritis Sister   . Hypertension Sister   . Thyroid disease Daughter   . Thyroid disease Daughter   . Thyroid disease Daughter        Physical Exam:  GEN:  Pleasant  74 y.o. female examined and in no acute distress; cooperative with exam Filed Vitals:   12/16/14 2201 12/16/14 2215 12/16/14 2230 12/16/14 2245  BP: 180/75 177/74 152/77 152/62  Pulse: 75 66 67 63  Temp:      TempSrc:      Resp: 21 12 19 18   Height:      Weight:      SpO2: 100% 100% 100% 100%   Blood pressure 152/62, pulse 63, temperature 98.2 F (36.8 C), temperature source Oral, resp. rate 18, height 5\' 6"  (1.676 m), weight 29.541 kg (65 lb 2 oz), SpO2 100 %. PSYCH: SHe is alert and oriented x4; does not appear anxious does not appear depressed; affect is normal HEENT: Normocephalic and Atraumatic, Mucous membranes pink; PERRLA; EOM intact; Fundi:  Benign;  No scleral icterus, Nares: Patent, Oropharynx: Clear, Edentulous or Fair Dentition,    Neck:  FROM, No Cervical Lymphadenopathy nor Thyromegaly or Carotid Bruit; No JVD; Breasts:: Not examined CHEST WALL: No tenderness CHEST: Normal respiration, clear to auscultation bilaterally HEART: Regular rate and rhythm; no murmurs rubs or gallops BACK: No kyphosis or scoliosis; No CVA tenderness ABDOMEN: Positive Bowel Sounds, Scaphoid, Obese, Soft Non-Tender, No Rebound or Guarding; No Masses, No Organomegaly, No Pannus; No Intertriginous candida. Rectal Exam: Not done EXTREMITIES: No Bone or Joint Deformity; Age-Appropriate Arthropathy of the Hands and knees; No Cyanosis, Clubbing, or Edema; No Ulcerations. Genitalia: not  examined PULSES: 2+ and symmetric SKIN: Normal hydration no rash or ulceration  CNS:  Alert and Oriented x 4, No Focal Deficits Mental Status:  Alert, Oriented, Thought Content Appropriate. Speech Fluent without evidence of Aphasia. Able to follow 3 step commands without difficulty.  In No obvious pain.   Cranial Nerves:  II: Discs flat bilaterally; Visual fields Intact, Pupils equal and reactive.    III,IV, VI: Extra-ocular motions intact bilaterally    V,VII: smile symmetric, facial light touch sensation normal bilaterally    VIII: hearing intact bilaterally    IX,X: gag reflex present    XI: bilateral shoulder shrug    XII: midline tongue extension   Motor:  Right:  Upper extremity 5/5     Left:  Upper extremity 5/5     Right:  Lower extremity 5/5    Left:  Lower extremity 5/5     Tone and Bulk:  normal tone throughout; no atrophy noted   Sensory:  Pinprick and light touch intact throughout, bilaterally   Deep Tendon Reflexes: 2+ and symmetric throughout   Plantars/ Babinski:  Right: normal Left: normal    Cerebellar:  Finger to nose without difficulty.   Gait: deferred    Vascular: pulses palpable throughout    Labs on Admission:  Basic Metabolic Panel:  Recent Labs Lab 12/16/14 2102 12/16/14 2118  NA 141 143  K 3.8 3.8  CL 110 108  CO2 22  --   GLUCOSE 170* 167*  BUN 10 10  CREATININE 0.80 0.60  CALCIUM 8.9  --    Liver Function Tests:  Recent Labs Lab 12/16/14 2102  AST 21  ALT 10  ALKPHOS 55  BILITOT 0.4  PROT 7.1  ALBUMIN 3.3*   No results for input(s): LIPASE, AMYLASE in the last 168 hours. No results for input(s): AMMONIA in the last 168 hours. CBC:  Recent Labs Lab 12/16/14 2102 12/16/14 2118  WBC 10.7*  --   NEUTROABS 8.9*  --   HGB 11.2* 12.9  HCT 34.2* 38.0  MCV 79.2  --   PLT 370  --    Cardiac Enzymes: No results for input(s): CKTOTAL, CKMB, CKMBINDEX, TROPONINI in the last 168 hours.  BNP (last 3 results) No results for  input(s): BNP in the last 8760 hours.  ProBNP (last 3 results) No results for input(s): PROBNP in the last 8760 hours.  CBG:  Recent Labs Lab 12/16/14 2103  GLUCAP 184*    Radiological Exams on Admission: Ct Head Wo Contrast  12/16/2014   CLINICAL DATA:  Sudden onset difficulty speaking recently. Last known well 1 hr ago. No previous history of stroke. Elevated blood pressure.  EXAM: CT HEAD WITHOUT CONTRAST  TECHNIQUE: Contiguous axial images were obtained from the base of the skull through the vertex without intravenous contrast.  COMPARISON:  CT sinuses 12/30/2009  FINDINGS: Diffuse cerebral atrophy. Mild low-attenuation changes in the deep white matter suggesting mild small vessel ischemic change. No ventricular dilatation. No mass effect or midline shift. No abnormal extra-axial fluid collections. Gray-white matter junctions are distinct. Basal cisterns are not effaced. No evidence of acute intracranial hemorrhage. No depressed skull fractures. Visualized paranasal sinuses and mastoid air cells are not opacified.  IMPRESSION: No acute intracranial abnormalities. Mild atrophy and small vessel ischemic change.  These results were called by telephone at the time of interpretation on 12/16/2014 at 9:20 pm to Dr. Elnora Morrison , who verbally acknowledged these results.   Electronically Signed   By: Lucienne Capers M.D.   On: 12/16/2014 21:22     EKG: Independently reviewed. Normal Sinus Rhythm at 68 with Nonspecific Changes    Assessment/Plan:   74 y.o. female with  Principal Problem:   1.   TIA (transient ischemic attack)/ Expressive aphasia   TIA Workup   Telemetry Monitoring   Neuro checks   ASA Rx   MRI/MRA Brain, Carotid US, and 2D ECHO ordered   Check Fasting Lipids and HbA1C in AM      Active Problems:   2.   Hyperlipidemia-   Check Lipids in AM   Start Atorvastatin Rx        3.   Essential hypertension   Continue Toprol XL as BP and HR Tolerates   Monitor  BPs   IV  Hydralazine PRN SBP > 170     4.   Hypothyroidism   TSH on 12/11/2014 was 188 and her dose of     Levothyroxine was increased to 17mcg     5.   Rheumatoid arthritis- On Prednisone and Ibuprofen   Continue Prednisone daily   Add Protonix 40 mg Po daily   Discontinue Ibuprofen while on Prednisone         6.   DVT Prophylaxis    Lovenox     Code Status:     FULL CODE Family Communication:  Family at Bedside Disposition Plan:         Time spent:  [place] [facility] Pager [phone]   If [place] Please Contact the Day Rounding Team MD for [facility]  If 7PM-7AM, Please Contact Night-Floor Coverage  [email or web address] Password [email or web address] 12/16/2014, 11:29 PM     ADDENDUM:   Patient was seen and examined on 12/16/2014

## 2014-12-16 NOTE — ED Notes (Signed)
Dr. Jenkins at the bedside.  

## 2014-12-17 ENCOUNTER — Observation Stay (HOSPITAL_COMMUNITY): Payer: Medicare Other

## 2014-12-17 DIAGNOSIS — R4701 Aphasia: Secondary | ICD-10-CM | POA: Diagnosis not present

## 2014-12-17 DIAGNOSIS — I10 Essential (primary) hypertension: Secondary | ICD-10-CM | POA: Diagnosis not present

## 2014-12-17 DIAGNOSIS — G458 Other transient cerebral ischemic attacks and related syndromes: Secondary | ICD-10-CM | POA: Diagnosis not present

## 2014-12-17 DIAGNOSIS — R4789 Other speech disturbances: Secondary | ICD-10-CM | POA: Diagnosis not present

## 2014-12-17 DIAGNOSIS — G459 Transient cerebral ischemic attack, unspecified: Secondary | ICD-10-CM | POA: Diagnosis not present

## 2014-12-17 DIAGNOSIS — E785 Hyperlipidemia, unspecified: Secondary | ICD-10-CM | POA: Diagnosis not present

## 2014-12-17 LAB — LIPID PANEL
Cholesterol: 184 mg/dL (ref 0–200)
HDL: 42 mg/dL (ref >39)
LDL Cholesterol: 121 mg/dL — ABNORMAL HIGH (ref 0–99)
Total CHOL/HDL Ratio: 4.4 RATIO
Triglycerides: 104 mg/dL (ref <150)
VLDL: 21 mg/dL (ref 0–40)

## 2014-12-17 LAB — GLUCOSE, CAPILLARY: Glucose-Capillary: 116 mg/dL — ABNORMAL HIGH (ref 70–99)

## 2014-12-17 MED ORDER — POLYETHYLENE GLYCOL 3350 17 G PO PACK
17.0000 g | PACK | Freq: Once | ORAL | Status: AC
  Administered 2014-12-17: 17 g via ORAL
  Filled 2014-12-17: qty 1

## 2014-12-17 MED ORDER — ATORVASTATIN CALCIUM 10 MG PO TABS
10.0000 mg | ORAL_TABLET | Freq: Every day | ORAL | Status: DC
  Administered 2014-12-17: 10 mg via ORAL
  Filled 2014-12-17: qty 1

## 2014-12-17 NOTE — Progress Notes (Signed)
Pt present from home via [facility] to ED as code stroke.  Meet pt in [place], ED RN reports pt LSN at 2000, c/o slurred speech, no other deficits.  CT negative for bleed.  Pt A/OX4, hypertensive.  Dr Nicole Kindred to bedside in ED.  NIHSS 1, speech improving quickly, no other deficits noted.  Pt will be admitted for stroke work up, no TPA at this time.  ED RN Primitivo Gauze updated on plan and will do QW30 min neuro checks while pt remains in TPA window until 0030.  Family and pt advised of plan and agree, encouraged to report any new or worsening symptoms.

## 2014-12-17 NOTE — Progress Notes (Signed)
STROKE TEAM PROGRESS NOTE   HISTORY Nancy [name] is an 74 y.o. female with a history of depression, hyperlipidemia, hypertension and hypothyroidism presenting with acute speech output abnormality which started at 8 PM tonight 12/16/2014 ([facility]KW). There's no previous history of stroke nor TIA. She has not been on antiplatelet therapy. She had no associated focal weakness or numbness. She was able to understand what was being said to her. CT scan of the head showed no acute intracranial abnormality. NIH stroke score was 1 with residual minimal speech hesitancy. Patient was not administered TPA secondary to Resolving deficits. She was admitted for further evaluation and treatment.   SUBJECTIVE (INTERVAL HISTORY) Her  Daughter and family members are at the bedside.  Overall she feels her condition is rapidly improving. Patient is not in room and gone for tests hence was not seen   OBJECTIVE Temp:  [97.7 F (36.5 C)-98.6 F (37 C)] 97.7 F (36.5 C) (02/08 0924) Pulse Rate:  [63-79] 69 (02/08 0924) Cardiac Rhythm:  [-] Normal sinus rhythm (02/07 2318) Resp:  [12-21] 18 (02/08 0924) BP: (120-195)/(41-77) 120/53 mmHg (02/08 0924) SpO2:  [96 %-100 %] 99 % (02/08 0924) FiO2 (%):  [0 %] 0 % (02/07 2103) Weight:  [29.541 kg (65 lb 2 oz)] 29.541 kg (65 lb 2 oz) (02/07 2104)   Recent Labs Lab 12/16/14 2103 12/17/14 0640  GLUCAP 184* 116*    Recent Labs Lab 12/16/14 2102 12/16/14 2118  NA 141 143  K 3.8 3.8  CL 110 108  CO2 22  --   GLUCOSE 170* 167*  BUN 10 10  CREATININE 0.80 0.60  CALCIUM 8.9  --     Recent Labs Lab 12/16/14 2102  AST 21  ALT 10  ALKPHOS 55  BILITOT 0.4  PROT 7.1  ALBUMIN 3.3*    Recent Labs Lab 12/16/14 2102 12/16/14 2118  WBC 10.7*  --   NEUTROABS 8.9*  --   HGB 11.2* 12.9  HCT 34.2* 38.0  MCV 79.2  --   PLT 370  --    No results for input(s): CKTOTAL, CKMB, CKMBINDEX, TROPONINI in the last 168 hours.  Recent Labs  12/16/14 2102  LABPROT 14.2   INR 1.09    Recent Labs  12/16/14 2201  COLORURINE YELLOW  LABSPEC 1.009  PHURINE 6.0  GLUCOSEU NEGATIVE  HGBUR NEGATIVE  BILIRUBINUR NEGATIVE  KETONESUR NEGATIVE  PROTEINUR NEGATIVE  UROBILINOGEN 1.0  NITRITE NEGATIVE  LEUKOCYTESUR NEGATIVE       Component Value Date/Time   CHOL 184 12/17/2014 0725   TRIG 104 12/17/2014 0725   HDL 42 12/17/2014 0725   CHOLHDL 4.4 12/17/2014 0725   VLDL 21 12/17/2014 0725   LDLCALC 121* 12/17/2014 0725   No results found for: HGBA1C    Component Value Date/Time   LABOPIA NONE DETECTED 12/16/2014 2201   COCAINSCRNUR NONE DETECTED 12/16/2014 2201   LABBENZ NONE DETECTED 12/16/2014 2201   AMPHETMU NONE DETECTED 12/16/2014 2201   THCU NONE DETECTED 12/16/2014 2201   LABBARB NONE DETECTED 12/16/2014 2201     Recent Labs Lab 12/16/14 2102  ETH <5    Ct Head Wo Contrast  12/16/2014   CLINICAL DATA:  Sudden onset difficulty speaking recently. Last known well 1 hr ago. No previous history of stroke. Elevated blood pressure.  EXAM: CT HEAD WITHOUT CONTRAST  TECHNIQUE: Contiguous axial images were obtained from the base of the skull through the vertex without intravenous contrast.  COMPARISON:  CT sinuses 12/30/2009  FINDINGS:  Diffuse cerebral atrophy. Mild low-attenuation changes in the deep white matter suggesting mild small vessel ischemic change. No ventricular dilatation. No mass effect or midline shift. No abnormal extra-axial fluid collections. Gray-white matter junctions are distinct. Basal cisterns are not effaced. No evidence of acute intracranial hemorrhage. No depressed skull fractures. Visualized paranasal sinuses and mastoid air cells are not opacified.  IMPRESSION: No acute intracranial abnormalities. Mild atrophy and small vessel ischemic change.  These results were called by telephone at the time of interpretation on 12/16/2014 at 9:20 pm to Dr. Elnora Morrison , who verbally acknowledged these results.   Electronically Signed   By:  Lucienne Capers M.D.   On: 12/16/2014 21:22   Mri Brain Without Contrast  12/17/2014   CLINICAL DATA:  Sudden onset of difficulty speaking 12/16/2014. Symptoms have now resolved. Hypertension.  EXAM: MRI HEAD WITHOUT CONTRAST  MRA HEAD WITHOUT CONTRAST  TECHNIQUE: Multiplanar, multiecho pulse sequences of the brain and surrounding structures were obtained without intravenous contrast. Angiographic images of the head were obtained using MRA technique without contrast.  COMPARISON:  CT head 12/16/2014.  FINDINGS: MRI HEAD FINDINGS  No evidence for acute infarction, hemorrhage, mass lesion, hydrocephalus, or extra-axial fluid. Normal for age cerebral volume. Mild subcortical and periventricular T2 and FLAIR hyperintensities, likely chronic microvascular ischemic change. Flow voids are maintained throughout the carotid, basilar, and vertebral arteries. There are no areas of chronic hemorrhage. Pituitary, pineal, and cerebellar tonsils unremarkable. Mild pannus surrounds the odontoid. Mild stenosis suspected at C3-4 due to slip and degenerative disc disease. Visualized calvarium, skull base, and upper cervical osseous structures unremarkable. Scalp and extracranial soft tissues, orbits, sinuses, and mastoids show no acute process.  MRA HEAD FINDINGS  Mild dolichoectasia. The internal carotid arteries are widely patent. The basilar artery is widely patent with both vertebrals contributing.  Minor irregularity of the A1 segment RIGHT anterior cerebral artery. Focal narrowing of the mid M1 segment RIGHT middle cerebral artery estimated 50%. No proximal LEFT M1 or proximal PCA disease. No cerebellar branch occlusion. Minor irregularity of the MCA branches distal to the trifurcation suggesting intracranial atherosclerotic change.  IMPRESSION: No acute intracranial findings.  Good general agreement recent CT.  Mild chronic microvascular ischemic change.  Intracranial atherosclerotic change as described, most notable in the  RIGHT M1 MCA where a 50% stenosis is suspected.  Mild pannus.  Cervical stenosis suspect at C3-C4.   Electronically Signed   By: Rolla Flatten M.D.   On: 12/17/2014 07:17   Mr Jodene Nam Head/brain Wo Cm  12/17/2014   CLINICAL DATA:  Sudden onset of difficulty speaking 12/16/2014. Symptoms have now resolved. Hypertension.  EXAM: MRI HEAD WITHOUT CONTRAST  MRA HEAD WITHOUT CONTRAST  TECHNIQUE: Multiplanar, multiecho pulse sequences of the brain and surrounding structures were obtained without intravenous contrast. Angiographic images of the head were obtained using MRA technique without contrast.  COMPARISON:  CT head 12/16/2014.  FINDINGS: MRI HEAD FINDINGS  No evidence for acute infarction, hemorrhage, mass lesion, hydrocephalus, or extra-axial fluid. Normal for age cerebral volume. Mild subcortical and periventricular T2 and FLAIR hyperintensities, likely chronic microvascular ischemic change. Flow voids are maintained throughout the carotid, basilar, and vertebral arteries. There are no areas of chronic hemorrhage. Pituitary, pineal, and cerebellar tonsils unremarkable. Mild pannus surrounds the odontoid. Mild stenosis suspected at C3-4 due to slip and degenerative disc disease. Visualized calvarium, skull base, and upper cervical osseous structures unremarkable. Scalp and extracranial soft tissues, orbits, sinuses, and mastoids show no acute process.  MRA HEAD FINDINGS  Mild dolichoectasia. The internal carotid arteries are widely patent. The basilar artery is widely patent with both vertebrals contributing.  Minor irregularity of the A1 segment RIGHT anterior cerebral artery. Focal narrowing of the mid M1 segment RIGHT middle cerebral artery estimated 50%. No proximal LEFT M1 or proximal PCA disease. No cerebellar branch occlusion. Minor irregularity of the MCA branches distal to the trifurcation suggesting intracranial atherosclerotic change.  IMPRESSION: No acute intracranial findings.  Good general agreement  recent CT.  Mild chronic microvascular ischemic change.  Intracranial atherosclerotic change as described, most notable in the RIGHT M1 MCA where a 50% stenosis is suspected.  Mild pannus.  Cervical stenosis suspect at C3-C4.   Electronically Signed   By: Rolla Flatten M.D.   On: 12/17/2014 07:17   Carotid Doppler  There is 1-39% bilateral ICA stenosis. Vertebral artery flow is antegrade.    2D Echocardiogram  EF 60-65% with no source of embolus.   PHYSICAL EXAM Not done as patient not in room  ASSESSMENT/PLAN [name] is a 74 y.o. female with history of depression, hyperlipidemia, hypertension and hypothyroidism presenting with acute speech output abnormality. She did not receive IV t-PA due to resolving deficits.   TIA:  Versus seizure work up pending  Resultant   No deficits  MRI  No acute stroke  MRA  RIGHT M1 MCA  a 50% stenosis  Carotid Doppler  No significant stenosis   2D Echo  No source of embolus   HgbA1c pending  Lovenox 30 mg sq daily for VTE prophylaxis  Diet Heart thin liquids  no antithrombotic prior to admission, now on aspirin 325 mg orally every day  Patient counseled to be compliant with her antithrombotic medications  Ongoing aggressive stroke risk factor management  Therapy recommendations:  Pending  Disposition:  pending  Hypertension  BP 120-195/45-77 past 24h (12/17/2014 @ 10:42 AM)   Patient  To be counseled to be compliant with her blood pressure medications  Hyperlipidemia  Home meds:  No statin  LDL 121, goal < 70  New lipitor 10 mg added this admission  Continue statin at discharge  Other Stroke Risk Factors  Advanced age  E-Cigarette smoker, advised to stop smoking  , Body mass index is 10.52 kg/(m^2).  Other Active Problems  Constipation  Hypothyroidism   RA on prednisone  Hospital day # 1  Patient needs ongoing TIA work up and will also check EEG.will make final recommendations after review of tests and d/w  patient in am D/W daughters and other family members about stroke risk, need for evaluation and answered questions. Antony Contras, MD    To contact Stroke Continuity provider, please refer to [email or web address] After hours, contact General Neurology

## 2014-12-17 NOTE — Progress Notes (Signed)
UR completed 

## 2014-12-17 NOTE — Progress Notes (Signed)
VASCULAR LAB PRELIMINARY  PRELIMINARY  PRELIMINARY  PRELIMINARY  Carotid duplex completed.    Preliminary report:  Bilateral:  1-39% ICA stenosis.  Vertebral artery flow is antegrade.     Kaiulani Sitton, RVS 12/17/2014, 12:45 PM

## 2014-12-17 NOTE — Progress Notes (Signed)
[facility] Progress Note   TARINI CARRIER CWC:[id number] DOB: February 03, 1941 DOA: 12/16/2014 PCP: [name]., NP  Brief narrative: Nancy [name] is a 74 y.o. female with a history of Rheumatoid Arthritis, HTN, Hyperlipidemia, and Hypothyroidism who presents to the ED with complaints of sudden onset of difficulty speaking.   Subjective: No difficulties with speech at this time. She explained to me that she often gets constipated for a few days. At one time she ended up becoming tachycardic/ sweaty and almost passed out and ended up having a BM on herself.   Assessment/Plan: Principal Problem:   TIA (transient ischemic attack)?   Expressive aphasia - neuro suspects possible seizure instead- EEG ordered  Active Problems: Constipation with vasovagal episode in the past - have recommended daily Miralax and if she does not have a BM then Dulcolax  - give Miralax today    Hyperlipidemia -cont Statin    Essential hypertension -cont Toprol    Hypothyroidism - cont LEvothyroxine    Rheumatoid arthritis - cont Prednisone    Code Status: full code Family Communication:  Disposition Plan: home when work up complete DVT prophylaxis: Lovenox  Consultants: neuro  Procedures:   Antibiotics: Anti-infectives    None         Objective: Filed Weights   12/16/14 2104  Weight: 29.541 kg (65 lb 2 oz)    Intake/Output Summary (Last 24 hours) at 12/17/14 1421 Last data filed at 12/16/14 2330  Gross per 24 hour  Intake      0 ml  Output    250 ml  Net   -250 ml     Vitals Filed Vitals:   12/17/14 0924 12/17/14 1100 12/17/14 1331 12/17/14 1417  BP: 120/53 112/44 112/50 113/28  Pulse: 69 72 76 70  Temp: 97.7 F (36.5 C) 98.5 F (36.9 C) 98.6 F (37 C) 97.9 F (36.6 C)  TempSrc: Oral Oral Oral Oral  Resp: 18 18 16 16   Height:      Weight:      SpO2: 99% 100% 100% 98%    Exam: General: No acute respiratory distress Lungs: Clear to auscultation  bilaterally without wheezes or crackles Cardiovascular: Regular rate and rhythm without murmur gallop or rub normal S1 and S2 Abdomen: Nontender, nondistended, soft, bowel sounds positive, no rebound, no ascites, no appreciable mass Extremities: No significant cyanosis, clubbing, or edema bilateral lower extremities  Data Reviewed: Basic Metabolic Panel:  Recent Labs Lab 12/16/14 2102 12/16/14 2118  NA 141 143  K 3.8 3.8  CL 110 108  CO2 22  --   GLUCOSE 170* 167*  BUN 10 10  CREATININE 0.80 0.60  CALCIUM 8.9  --    Liver Function Tests:  Recent Labs Lab 12/16/14 2102  AST 21  ALT 10  ALKPHOS 55  BILITOT 0.4  PROT 7.1  ALBUMIN 3.3*   No results for input(s): LIPASE, AMYLASE in the last 168 hours. No results for input(s): AMMONIA in the last 168 hours. CBC:  Recent Labs Lab 12/16/14 2102 12/16/14 2118  WBC 10.7*  --   NEUTROABS 8.9*  --   HGB 11.2* 12.9  HCT 34.2* 38.0  MCV 79.2  --   PLT 370  --    Cardiac Enzymes: No results for input(s): CKTOTAL, CKMB, CKMBINDEX, TROPONINI in the last 168 hours. BNP (last 3 results) No results for input(s): BNP in the last 8760 hours.  ProBNP (last 3 results) No results for input(s): PROBNP in the last 8760 hours.  CBG:  Recent Labs Lab 12/16/14 2103 12/17/14 0640  GLUCAP 184* 116*    No results found for this or any previous visit (from the past 240 hour(s)).   Studies:  Recent x-ray studies have been reviewed in detail by the Attending Physician  Scheduled Meds:  Scheduled Meds: . aspirin  325 mg Oral Daily  . atorvastatin  10 mg Oral q1800  . enoxaparin (LOVENOX) injection  30 mg Subcutaneous Daily  . folic acid  1 mg Oral Daily  . levothyroxine  50 mcg Oral QAC breakfast  . loratadine  10 mg Oral Daily  . metoprolol succinate  50 mg Oral Daily  . pantoprazole  40 mg Oral Daily  . predniSONE  5 mg Oral BID WC   Continuous Infusions:   Time spent on care of this patient: 35  min   [place], MD 12/17/2014, 2:21 PM  LOS: 1 day   [facility] Office  [phone] Pager - Text Page per [email or web address]  If 7PM-7AM, please contact night-coverage [email or web address]

## 2014-12-17 NOTE — Procedures (Signed)
History: 74 yo F with transient speech disturbance.   Sedation: None  Technique: This is a 17 channel routine scalp EEG performed at the bedside with bipolar and monopolar montages arranged in accordance to the international 10/20 system of electrode placement. One channel was dedicated to EKG recording.    Background: There is a well defined posterior dominant rhythm of 9 Hz that attenuates with eye opening. There is anterior shifting of the PDR with drowsiness and sleep structures are briefly recorded.   Photic stimulation: Physiologic driving is not performed  EEG Abnormalities: None  Clinical Interpretation: This normal EEG is recorded in the waking and sleep state. There was no seizure or seizure predisposition recorded on this study.   Roland Rack, MD [facility] [phone]  If 7pm- 7am, please page neurology on call as listed in [place].

## 2014-12-17 NOTE — Progress Notes (Signed)
EEG Completed; Results Pending  

## 2014-12-17 NOTE — Progress Notes (Signed)
Pt admitted from ED with stroke like symptoms accmpanied by family, alert and oriented, pt settled in bed, tele monitor put on pt,admission doc paged for orders, call light at bedside,will however continue to monitor. Nancy [name], Nancy [name]

## 2014-12-17 NOTE — Progress Notes (Signed)
  Echocardiogram 2D Echocardiogram has been performed.  Nancy [name] 12/17/2014, 1:07 PM

## 2014-12-18 ENCOUNTER — Telehealth: Payer: Self-pay | Admitting: Family

## 2014-12-18 DIAGNOSIS — G458 Other transient cerebral ischemic attacks and related syndromes: Secondary | ICD-10-CM | POA: Diagnosis not present

## 2014-12-18 DIAGNOSIS — R4701 Aphasia: Secondary | ICD-10-CM | POA: Diagnosis not present

## 2014-12-18 DIAGNOSIS — E785 Hyperlipidemia, unspecified: Secondary | ICD-10-CM | POA: Diagnosis not present

## 2014-12-18 DIAGNOSIS — I10 Essential (primary) hypertension: Secondary | ICD-10-CM | POA: Diagnosis not present

## 2014-12-18 DIAGNOSIS — G459 Transient cerebral ischemic attack, unspecified: Secondary | ICD-10-CM | POA: Diagnosis not present

## 2014-12-18 LAB — HEMOGLOBIN A1C
Hgb A1c MFr Bld: 6.6 % — ABNORMAL HIGH (ref 4.8–5.6)
Mean Plasma Glucose: 143 mg/dL

## 2014-12-18 LAB — GLUCOSE, CAPILLARY
Glucose-Capillary: 138 mg/dL — ABNORMAL HIGH (ref 70–99)
Glucose-Capillary: 121 mg/dL — ABNORMAL HIGH (ref 70–99)
Glucose-Capillary: 66 mg/dL — ABNORMAL LOW (ref 70–99)

## 2014-12-18 MED ORDER — BISACODYL 10 MG RE SUPP
10.0000 mg | Freq: Once | RECTAL | Status: DC
  Filled 2014-12-18: qty 1

## 2014-12-18 MED ORDER — ATORVASTATIN CALCIUM 10 MG PO TABS
10.0000 mg | ORAL_TABLET | Freq: Every day | ORAL | Status: DC
Start: 2014-12-18 — End: 2015-05-17

## 2014-12-18 MED ORDER — BISACODYL 10 MG RE SUPP: 10.0000 mg | Freq: Every day | RECTAL | Status: DC | PRN

## 2014-12-18 MED ORDER — POLYETHYLENE GLYCOL 3350 17 G PO PACK: 17.0000 g | PACK | Freq: Every day | ORAL | Status: DC

## 2014-12-18 MED ORDER — ASPIRIN 325 MG PO TABS: 325.0000 mg | ORAL_TABLET | Freq: Every day | ORAL | Status: DC

## 2014-12-18 NOTE — Telephone Encounter (Signed)
Please contact pt and arrange hospital follow up.

## 2014-12-18 NOTE — Discharge Summary (Addendum)
Physician Discharge Summary  Nancy [name] VVO:[id number] DOB: 11-15-1940 DOA: 12/16/2014  PCP: [name]., NP  Admit date: 12/16/2014 Discharge date: 12/18/2014  Time spent: 45 minutes    Discharge Condition: stable Diet recommendation: heart healthy.   Discharge Diagnoses:  Principal Problem:     Expressive aphasia/ possibleTIA (transient ischemic attack) Active Problems:   Hyperlipidemia   Essential hypertension   Hypothyroidism   Rheumatoid arthritis    History of present illness:  Nancy [name] is a 74 y.o. female with a history of Rheumatoid Arthritis, HTN, Hyperlipidemia, and Hypothyroidism who presents to the ED with complaints of sudden onset of difficulty speaking.  Hospital Course:  Principal Problem:  Possible TIA (transient ischemic attack)?  - see results of testing/ imaging below - neuro suspects either a TIA vs possible seizure instead- EEG - negative for seizure activity - per Stroke team, start full dose ASA and Statin    Active Problems: Constipation with vasovagal episode in the past - she admits to almost passing out once with rapid heart rate and a diaphoresis when she was trying to have a BM  - have recommended daily Miralax and if she does not have a BM then Dulcolax    Hyperlipidemia -cont Statin   Essential hypertension -cont Toprol   Hypothyroidism - cont LEvothyroxine   Rheumatoid arthritis - cont Prednisone  Procedures:  ECHO: Study Conclusions  - Left ventricle: The cavity size was normal. There was mild focal basal hypertrophy of the septum. Systolic function was normal. The estimated ejection fraction was in the range of 60% to 65%. Wall motion was normal; there were no regional wall motion abnormalities. Doppler parameters are consistent with abnormal left ventricular relaxation (grade 1 diastolic dysfunction). Doppler parameters are consistent with high ventricular  filling pressure.  Impressions:  - No cardiac source of emboli was indentified.  Carotid duplex: Bilateral: 1-39% ICA stenosis. Vertebral artery flow is antegrade.    Consultations:  Neuro/ stroke team  Discharge Exam: Filed Weights   12/16/14 2104  Weight: 29.541 kg (65 lb 2 oz)   Filed Vitals:   12/18/14 1341  BP: 150/57  Pulse: 62  Temp: 98 F (36.7 C)  Resp: 16    General: AAO x 3, no distress Cardiovascular: RRR, no murmurs  Respiratory: clear to auscultation bilaterally GI: soft, non-tender, non-distended, bowel sound positive  Discharge Instructions You were cared for by a hospitalist during your hospital stay. If you have any questions about your discharge medications or the care you received while you were in the hospital after you are discharged, you can call the unit and asked to speak with the hospitalist on call if the hospitalist that took care of you is not available. Once you are discharged, your primary care physician will handle any further medical issues. Please note that NO REFILLS for any discharge medications will be authorized once you are discharged, as it is imperative that you return to your primary care physician (or establish a relationship with a primary care physician if you do not have one) for your aftercare needs so that they can reassess your need for medications and monitor your lab values.      Discharge Instructions    Ambulatory referral to Neurology    Complete by:  As directed   Stroke patient. Dr. Leonie Man prefers follow up in 1 month     Diet - low sodium heart healthy    Complete by:  As directed      Increase  activity slowly    Complete by:  As directed             Medication List    TAKE these medications        aspirin 325 MG tablet  Take 1 tablet (325 mg total) by mouth daily.     atorvastatin 10 MG tablet  Commonly known as:  LIPITOR  Take 1 tablet (10 mg total) by mouth daily at 6 PM.     bisacodyl 10 MG  suppository  Commonly known as:  DULCOLAX  Place 1 suppository (10 mg total) rectally daily as needed for moderate constipation (if Miralax ineffective).     buPROPion 150 MG 12 hr tablet  Commonly known as:  WELLBUTRIN SR  Take 1 tablet (150 mg total) by mouth 2 (two) times daily.     folic acid 1 MG tablet  Commonly known as:  FOLVITE  Take 1 mg by mouth daily.     Ibuprofen 200 MG Caps  Take 400 mg by mouth as needed (for pain).     levothyroxine 50 MCG tablet  Commonly known as:  SYNTHROID, LEVOTHROID  Take 1 tablet (50 mcg total) by mouth daily.     loratadine 10 MG tablet  Commonly known as:  CLARITIN  Take 10 mg by mouth daily as needed. For allergies     metoprolol succinate 50 MG 24 hr tablet  Commonly known as:  TOPROL-XL  TAKE 1 TABLET BY MOUTH EVERY DAY WITH OR IMMEDIATELY FOLLOWING A MEAL     polyethylene glycol packet  Commonly known as:  MIRALAX  Take 17 g by mouth daily.     predniSONE 5 MG tablet  Commonly known as:  DELTASONE  Take 5 mg by mouth 2 (two) times daily with a meal.       No Known Allergies Follow-up Information    Follow up with SETHI,PRAMOD, MD In 1 month.   Specialties:  Neurology, Radiology   Why:  Stroke Clinic, Office will call you with appointment date & time   Contact information:   [address] [place] [address] [phone]        The results of significant diagnostics from this hospitalization (including imaging, microbiology, ancillary and laboratory) are listed below for reference.    Significant Diagnostic Studies: Ct Head Wo Contrast  12/16/2014   CLINICAL DATA:  Sudden onset difficulty speaking recently. Last known well 1 hr ago. No previous history of stroke. Elevated blood pressure.  EXAM: CT HEAD WITHOUT CONTRAST  TECHNIQUE: Contiguous axial images were obtained from the base of the skull through the vertex without intravenous contrast.  COMPARISON:  CT sinuses 12/30/2009  FINDINGS: Diffuse  cerebral atrophy. Mild low-attenuation changes in the deep white matter suggesting mild small vessel ischemic change. No ventricular dilatation. No mass effect or midline shift. No abnormal extra-axial fluid collections. Gray-white matter junctions are distinct. Basal cisterns are not effaced. No evidence of acute intracranial hemorrhage. No depressed skull fractures. Visualized paranasal sinuses and mastoid air cells are not opacified.  IMPRESSION: No acute intracranial abnormalities. Mild atrophy and small vessel ischemic change.  These results were called by telephone at the time of interpretation on 12/16/2014 at 9:20 pm to Dr. Elnora Morrison , who verbally acknowledged these results.   Electronically Signed   By: Lucienne Capers M.D.   On: 12/16/2014 21:22   Mri Brain Without Contrast  12/17/2014   CLINICAL DATA:  Sudden onset of difficulty speaking 12/16/2014. Symptoms have  now resolved. Hypertension.  EXAM: MRI HEAD WITHOUT CONTRAST  MRA HEAD WITHOUT CONTRAST  TECHNIQUE: Multiplanar, multiecho pulse sequences of the brain and surrounding structures were obtained without intravenous contrast. Angiographic images of the head were obtained using MRA technique without contrast.  COMPARISON:  CT head 12/16/2014.  FINDINGS: MRI HEAD FINDINGS  No evidence for acute infarction, hemorrhage, mass lesion, hydrocephalus, or extra-axial fluid. Normal for age cerebral volume. Mild subcortical and periventricular T2 and FLAIR hyperintensities, likely chronic microvascular ischemic change. Flow voids are maintained throughout the carotid, basilar, and vertebral arteries. There are no areas of chronic hemorrhage. Pituitary, pineal, and cerebellar tonsils unremarkable. Mild pannus surrounds the odontoid. Mild stenosis suspected at C3-4 due to slip and degenerative disc disease. Visualized calvarium, skull base, and upper cervical osseous structures unremarkable. Scalp and extracranial soft tissues, orbits, sinuses, and  mastoids show no acute process.  MRA HEAD FINDINGS  Mild dolichoectasia. The internal carotid arteries are widely patent. The basilar artery is widely patent with both vertebrals contributing.  Minor irregularity of the A1 segment RIGHT anterior cerebral artery. Focal narrowing of the mid M1 segment RIGHT middle cerebral artery estimated 50%. No proximal LEFT M1 or proximal PCA disease. No cerebellar branch occlusion. Minor irregularity of the MCA branches distal to the trifurcation suggesting intracranial atherosclerotic change.  IMPRESSION: No acute intracranial findings.  Good general agreement recent CT.  Mild chronic microvascular ischemic change.  Intracranial atherosclerotic change as described, most notable in the RIGHT M1 MCA where a 50% stenosis is suspected.  Mild pannus.  Cervical stenosis suspect at C3-C4.   Electronically Signed   By: Rolla Flatten M.D.   On: 12/17/2014 07:17   Mr Jodene Nam Head/brain Wo Cm  12/17/2014   CLINICAL DATA:  Sudden onset of difficulty speaking 12/16/2014. Symptoms have now resolved. Hypertension.  EXAM: MRI HEAD WITHOUT CONTRAST  MRA HEAD WITHOUT CONTRAST  TECHNIQUE: Multiplanar, multiecho pulse sequences of the brain and surrounding structures were obtained without intravenous contrast. Angiographic images of the head were obtained using MRA technique without contrast.  COMPARISON:  CT head 12/16/2014.  FINDINGS: MRI HEAD FINDINGS  No evidence for acute infarction, hemorrhage, mass lesion, hydrocephalus, or extra-axial fluid. Normal for age cerebral volume. Mild subcortical and periventricular T2 and FLAIR hyperintensities, likely chronic microvascular ischemic change. Flow voids are maintained throughout the carotid, basilar, and vertebral arteries. There are no areas of chronic hemorrhage. Pituitary, pineal, and cerebellar tonsils unremarkable. Mild pannus surrounds the odontoid. Mild stenosis suspected at C3-4 due to slip and degenerative disc disease. Visualized calvarium,  skull base, and upper cervical osseous structures unremarkable. Scalp and extracranial soft tissues, orbits, sinuses, and mastoids show no acute process.  MRA HEAD FINDINGS  Mild dolichoectasia. The internal carotid arteries are widely patent. The basilar artery is widely patent with both vertebrals contributing.  Minor irregularity of the A1 segment RIGHT anterior cerebral artery. Focal narrowing of the mid M1 segment RIGHT middle cerebral artery estimated 50%. No proximal LEFT M1 or proximal PCA disease. No cerebellar branch occlusion. Minor irregularity of the MCA branches distal to the trifurcation suggesting intracranial atherosclerotic change.  IMPRESSION: No acute intracranial findings.  Good general agreement recent CT.  Mild chronic microvascular ischemic change.  Intracranial atherosclerotic change as described, most notable in the RIGHT M1 MCA where a 50% stenosis is suspected.  Mild pannus.  Cervical stenosis suspect at C3-C4.   Electronically Signed   By: Rolla Flatten M.D.   On: 12/17/2014 07:17    Microbiology: No results  found for this or any previous visit (from the past 240 hour(s)).   Labs: Basic Metabolic Panel:  Recent Labs Lab 12/16/14 2102 12/16/14 2118  NA 141 143  K 3.8 3.8  CL 110 108  CO2 22  --   GLUCOSE 170* 167*  BUN 10 10  CREATININE 0.80 0.60  CALCIUM 8.9  --    Liver Function Tests:  Recent Labs Lab 12/16/14 2102  AST 21  ALT 10  ALKPHOS 55  BILITOT 0.4  PROT 7.1  ALBUMIN 3.3*   No results for input(s): LIPASE, AMYLASE in the last 168 hours. No results for input(s): AMMONIA in the last 168 hours. CBC:  Recent Labs Lab 12/16/14 2102 12/16/14 2118  WBC 10.7*  --   NEUTROABS 8.9*  --   HGB 11.2* 12.9  HCT 34.2* 38.0  MCV 79.2  --   PLT 370  --    Cardiac Enzymes: No results for input(s): CKTOTAL, CKMB, CKMBINDEX, TROPONINI in the last 168 hours. BNP: BNP (last 3 results) No results for input(s): BNP in the last 8760 hours.  ProBNP  (last 3 results) No results for input(s): PROBNP in the last 8760 hours.  CBG:  Recent Labs Lab 12/16/14 2103 12/17/14 0640 12/18/14 1159  GLUCAP 184* 116* 66*       SignedDebbe Odea, MD [facility] 12/18/2014, 2:13 PM

## 2014-12-18 NOTE — Progress Notes (Signed)
UR completed 

## 2014-12-18 NOTE — Progress Notes (Signed)
STROKE TEAM PROGRESS NOTE   HISTORY Nancy [name] is an 74 y.o. female with a history of depression, hyperlipidemia, hypertension and hypothyroidism presenting with acute speech output abnormality which started at 8 PM tonight 12/16/2014 (LKW). There's no previous history of stroke nor TIA. She has not been on antiplatelet therapy. She had no associated focal weakness or numbness. She was able to understand what was being said to her. CT scan of the head showed no acute intracranial abnormality. NIH stroke score was 1 with residual minimal speech hesitancy. Patient was not administered TPA secondary to Resolving deficits. She was admitted for further evaluation and treatment.   SUBJECTIVE (INTERVAL HISTORY) Family at bedside. Patient with questions about neck pain - appears to be related to RA.2   OBJECTIVE Temp:  [97.9 F (36.6 C)-98.6 F (37 C)] 98 F (36.7 C) (02/09 0531) Pulse Rate:  [58-76] 63 (02/09 1038) Cardiac Rhythm:  [-] Normal sinus rhythm (02/08 2035) Resp:  [16-18] 18 (02/09 0531) BP: (112-148)/(28-65) 148/65 mmHg (02/09 0531) SpO2:  [96 %-100 %] 98 % (02/09 0531)   Recent Labs Lab 12/16/14 2103 12/17/14 0640 12/18/14 1159  GLUCAP 184* 116* 66*    Recent Labs Lab 12/16/14 2102 12/16/14 2118  NA 141 143  K 3.8 3.8  CL 110 108  CO2 22  --   GLUCOSE 170* 167*  BUN 10 10  CREATININE 0.80 0.60  CALCIUM 8.9  --     Recent Labs Lab 12/16/14 2102  AST 21  ALT 10  ALKPHOS 55  BILITOT 0.4  PROT 7.1  ALBUMIN 3.3*    Recent Labs Lab 12/16/14 2102 12/16/14 2118  WBC 10.7*  --   NEUTROABS 8.9*  --   HGB 11.2* 12.9  HCT 34.2* 38.0  MCV 79.2  --   PLT 370  --    No results for input(s): CKTOTAL, CKMB, CKMBINDEX, TROPONINI in the last 168 hours.  Recent Labs  12/16/14 2102  LABPROT 14.2  INR 1.09    Recent Labs  12/16/14 2201  COLORURINE YELLOW  LABSPEC 1.009  PHURINE 6.0  GLUCOSEU NEGATIVE  HGBUR NEGATIVE  BILIRUBINUR NEGATIVE  KETONESUR  NEGATIVE  PROTEINUR NEGATIVE  UROBILINOGEN 1.0  NITRITE NEGATIVE  LEUKOCYTESUR NEGATIVE       Component Value Date/Time   CHOL 184 12/17/2014 0725   TRIG 104 12/17/2014 0725   HDL 42 12/17/2014 0725   CHOLHDL 4.4 12/17/2014 0725   VLDL 21 12/17/2014 0725   LDLCALC 121* 12/17/2014 0725   Lab Results  Component Value Date   HGBA1C 6.6* 12/17/2014      Component Value Date/Time   LABOPIA NONE DETECTED 12/16/2014 2201   COCAINSCRNUR NONE DETECTED 12/16/2014 2201   LABBENZ NONE DETECTED 12/16/2014 2201   AMPHETMU NONE DETECTED 12/16/2014 2201   THCU NONE DETECTED 12/16/2014 2201   LABBARB NONE DETECTED 12/16/2014 2201     Recent Labs Lab 12/16/14 2102  ETH <5    Ct Head Wo Contrast  12/16/2014   CLINICAL DATA:  Sudden onset difficulty speaking recently. Last known well 1 hr ago. No previous history of stroke. Elevated blood pressure.  EXAM: CT HEAD WITHOUT CONTRAST  TECHNIQUE: Contiguous axial images were obtained from the base of the skull through the vertex without intravenous contrast.  COMPARISON:  CT sinuses 12/30/2009  FINDINGS: Diffuse cerebral atrophy. Mild low-attenuation changes in the deep white matter suggesting mild small vessel ischemic change. No ventricular dilatation. No mass effect or midline shift. No abnormal extra-axial fluid  collections. Gray-white matter junctions are distinct. Basal cisterns are not effaced. No evidence of acute intracranial hemorrhage. No depressed skull fractures. Visualized paranasal sinuses and mastoid air cells are not opacified.  IMPRESSION: No acute intracranial abnormalities. Mild atrophy and small vessel ischemic change.  These results were called by telephone at the time of interpretation on 12/16/2014 at 9:20 pm to Dr. Elnora Morrison , who verbally acknowledged these results.   Electronically Signed   By: Lucienne Capers M.D.   On: 12/16/2014 21:22   Mri Brain Without Contrast  12/17/2014   CLINICAL DATA:  Sudden onset of difficulty  speaking 12/16/2014. Symptoms have now resolved. Hypertension.  EXAM: MRI HEAD WITHOUT CONTRAST  MRA HEAD WITHOUT CONTRAST  TECHNIQUE: Multiplanar, multiecho pulse sequences of the brain and surrounding structures were obtained without intravenous contrast. Angiographic images of the head were obtained using MRA technique without contrast.  COMPARISON:  CT head 12/16/2014.  FINDINGS: MRI HEAD FINDINGS  No evidence for acute infarction, hemorrhage, mass lesion, hydrocephalus, or extra-axial fluid. Normal for age cerebral volume. Mild subcortical and periventricular T2 and FLAIR hyperintensities, likely chronic microvascular ischemic change. Flow voids are maintained throughout the carotid, basilar, and vertebral arteries. There are no areas of chronic hemorrhage. Pituitary, pineal, and cerebellar tonsils unremarkable. Mild pannus surrounds the odontoid. Mild stenosis suspected at C3-4 due to slip and degenerative disc disease. Visualized calvarium, skull base, and upper cervical osseous structures unremarkable. Scalp and extracranial soft tissues, orbits, sinuses, and mastoids show no acute process.  MRA HEAD FINDINGS  Mild dolichoectasia. The internal carotid arteries are widely patent. The basilar artery is widely patent with both vertebrals contributing.  Minor irregularity of the A1 segment RIGHT anterior cerebral artery. Focal narrowing of the mid M1 segment RIGHT middle cerebral artery estimated 50%. No proximal LEFT M1 or proximal PCA disease. No cerebellar branch occlusion. Minor irregularity of the MCA branches distal to the trifurcation suggesting intracranial atherosclerotic change.  IMPRESSION: No acute intracranial findings.  Good general agreement recent CT.  Mild chronic microvascular ischemic change.  Intracranial atherosclerotic change as described, most notable in the RIGHT M1 MCA where a 50% stenosis is suspected.  Mild pannus.  Cervical stenosis suspect at C3-C4.   Electronically Signed   By: Rolla Flatten M.D.   On: 12/17/2014 07:17   Mr Jodene Nam Head/brain Wo Cm  12/17/2014   CLINICAL DATA:  Sudden onset of difficulty speaking 12/16/2014. Symptoms have now resolved. Hypertension.  EXAM: MRI HEAD WITHOUT CONTRAST  MRA HEAD WITHOUT CONTRAST  TECHNIQUE: Multiplanar, multiecho pulse sequences of the brain and surrounding structures were obtained without intravenous contrast. Angiographic images of the head were obtained using MRA technique without contrast.  COMPARISON:  CT head 12/16/2014.  FINDINGS: MRI HEAD FINDINGS  No evidence for acute infarction, hemorrhage, mass lesion, hydrocephalus, or extra-axial fluid. Normal for age cerebral volume. Mild subcortical and periventricular T2 and FLAIR hyperintensities, likely chronic microvascular ischemic change. Flow voids are maintained throughout the carotid, basilar, and vertebral arteries. There are no areas of chronic hemorrhage. Pituitary, pineal, and cerebellar tonsils unremarkable. Mild pannus surrounds the odontoid. Mild stenosis suspected at C3-4 due to slip and degenerative disc disease. Visualized calvarium, skull base, and upper cervical osseous structures unremarkable. Scalp and extracranial soft tissues, orbits, sinuses, and mastoids show no acute process.  MRA HEAD FINDINGS  Mild dolichoectasia. The internal carotid arteries are widely patent. The basilar artery is widely patent with both vertebrals contributing.  Minor irregularity of the A1 segment RIGHT anterior cerebral  artery. Focal narrowing of the mid M1 segment RIGHT middle cerebral artery estimated 50%. No proximal LEFT M1 or proximal PCA disease. No cerebellar branch occlusion. Minor irregularity of the MCA branches distal to the trifurcation suggesting intracranial atherosclerotic change.  IMPRESSION: No acute intracranial findings.  Good general agreement recent CT.  Mild chronic microvascular ischemic change.  Intracranial atherosclerotic change as described, most notable in the RIGHT M1 MCA  where a 50% stenosis is suspected.  Mild pannus.  Cervical stenosis suspect at C3-C4.   Electronically Signed   By: Rolla Flatten M.D.   On: 12/17/2014 07:17   Carotid Doppler  There is 1-39% bilateral ICA stenosis. Vertebral artery flow is antegrade.    2D Echocardiogram  EF 60-65% with no source of embolus.    PHYSICAL EXAM : pleasant elderly African-American lady currently not in distress. . Afebrile. Head is nontraumatic. Neck is supple without bruit.    Cardiac exam no murmur or gallop. Lungs are clear to auscultation. Distal pulses are well felt. Neurological Exam :  Awake  Alert oriented x 3. Normal speech and language.eye movements full without nystagmus.fundi were not visualized. Vision acuity and fields appear normal. Hearing is normal. Palatal movements are normal. Face symmetric. Tongue midline. Normal strength, tone, reflexes and coordination. Normal sensation. Gait deferred. ASSESSMENT/PLAN Nancy [name] is a 74 y.o. female with history of depression, hyperlipidemia, hypertension and hypothyroidism presenting with acute speech output abnormality. She did not receive IV t-PA due to resolving deficits.   Likely TIA, doubt seizure.   Resultant   No deficits  MRI  No acute stroke  MRA  RIGHT M1 MCA  a 50% stenosis  Carotid Doppler  No significant stenosis   2D Echo  No source of embolus   EEG negative for seizure  HgbA1c 6.6  Lovenox 30 mg sq daily for VTE prophylaxis    thin liquids  no antithrombotic prior to admission, now on aspirin 325 mg orally every day  Patient counseled to be compliant with her antithrombotic medications  Ongoing aggressive stroke risk factor management  Therapy recommendations:  No therapy needs  Disposition:  Return home with family  NO FURTHER STROKE WORKUP INDICATED  Patient has a 10-15% risk of having another stroke over the next year, the highest risk is within 2 weeks of the most recent TIA .  Ongoing risk factor control  by Primary Care Physician  Stroke Service will sign off. Please call should any needs arise.  Follow up with Dr. Antony Contras, stroke clinic at [facility] in 1 month, order placed.  Hypertension  Stable  Patient  To be counseled to be compliant with her blood pressure medications  Hyperlipidemia  Home meds:  No statin  LDL 121, goal < 70  New lipitor 10 mg added this admission  Continue statin at discharge  Other Stroke Risk Factors  Advanced age  E-Cigarette smoker, advised to stop smoking  Other Active Problems  Constipation  Hypothyroidism   RA on prednisone  Hospital day # 2  BIBY,SHARON  [place] for Pager information 12/18/2014 12:32 PM I have personally examined this patient, reviewed notes, independently viewed imaging studies, participated in medical decision making and plan of care. I have made any additions or clarifications directly to the above note. Agree with note above. Patient had a transient episode of speech difficulties and confusion and bilateral arm tremulousness is unclear if this is a posterior septation TIA which may be more  likely than a seizure. Brain imaging and EEG studies are both unremarkable. She is at risk for recurrent TIAs and and agree with ongoing stroke evaluation. Continue aspirin for stroke prevention and stroke risk factor modification. Discussed with patient and daughter and answered questions. Stroke team will sign off kindly call for questions Antony Contras, MD Medical Director [place] Pager: [phone] 12/18/2014 4:08 PM     To contact Stroke Continuity provider, please refer to [email or web address] After hours, contact General Neurology

## 2014-12-18 NOTE — Progress Notes (Signed)
Pt d/c to home by car with family. Assessment stable. Pt verbalizes understanding of d/c instructions. 

## 2014-12-19 LAB — GLUCOSE, CAPILLARY: Glucose-Capillary: 87 mg/dL (ref 70–99)

## 2014-12-19 NOTE — Telephone Encounter (Signed)
Admit date: 12/16/2014 Discharge date: 12/18/2014  Reason for admission:  Expressive aphasia/ possible TIA (transient ischemic attack)  Transition Care Management Follow-up Telephone Call  How have you been since you were released from the hospital?  Pt states she's doing pretty good.  Denies headache, facial drooping, vision changes, confusion, or weakness or tingling.  Speech is clear and appropriate. Daughter is at home with her to assist as needed.     Do you understand why you were in the hospital? yes   Do you understand the discharge instructions? yes  Items Reviewed:  Medications reviewed: yes  Allergies reviewed: yes  Dietary changes reviewed: yes, low sodium heart healthy  Referrals reviewed: yes, pt was encouraged to schedule appointment with Dr. Leonie Man.  Pt stated that daughter plans to call today.     Functional Questionnaire:   Activities of Daily Living (ADLs):   She states they are independent in the following: ambulation, bathing and hygiene, feeding, continence, grooming, toileting and dressing States they require assistance with the following: none   Any transportation issues/concerns?: no, patient drives, but daughter will be providing transportation for the next couple of days.   Any patient concerns? no   Confirmed importance and date/time of follow-up visits scheduled: yes   Confirmed with patient if condition begins to worsen call PCP or go to the ER: yes   Hospital follow up appointment scheduled for 12/26/14 @ 7:45 am with Debbrah Alar.

## 2014-12-21 NOTE — Telephone Encounter (Signed)
For some reason the order got marked as performed.  I called the patient she is scheduled for next week the day prior to her appt with you

## 2014-12-25 ENCOUNTER — Ambulatory Visit (INDEPENDENT_AMBULATORY_CARE_PROVIDER_SITE_OTHER)
Admission: RE | Admit: 2014-12-25 | Discharge: 2014-12-25 | Disposition: A | Discharge status: Home / Self Care | Payer: Medicare Other | Source: Ambulatory Visit | Attending: Family | Admitting: Family

## 2014-12-25 DIAGNOSIS — Z13820 Encounter for screening for osteoporosis: Secondary | ICD-10-CM | POA: Diagnosis not present

## 2014-12-25 DIAGNOSIS — M810 Age-related osteoporosis without current pathological fracture: Secondary | ICD-10-CM

## 2014-12-25 HISTORY — DX: Age-related osteoporosis without current pathological fracture: M81.0

## 2014-12-26 ENCOUNTER — Encounter: Payer: Self-pay | Admitting: Family

## 2014-12-26 ENCOUNTER — Ambulatory Visit (INDEPENDENT_AMBULATORY_CARE_PROVIDER_SITE_OTHER): Payer: Medicare Other | Admitting: Family

## 2014-12-26 VITALS — BP 160/80 | HR 72 | Temp 98.1°F | Resp 18 | Ht 66.5 in | Wt 140.8 lb

## 2014-12-26 DIAGNOSIS — G458 Other transient cerebral ischemic attacks and related syndromes: Secondary | ICD-10-CM | POA: Diagnosis not present

## 2014-12-26 DIAGNOSIS — Z23 Encounter for immunization: Secondary | ICD-10-CM | POA: Diagnosis not present

## 2014-12-26 DIAGNOSIS — M069 Rheumatoid arthritis, unspecified: Secondary | ICD-10-CM

## 2014-12-26 DIAGNOSIS — I10 Essential (primary) hypertension: Secondary | ICD-10-CM | POA: Diagnosis not present

## 2014-12-26 MED ORDER — PREDNISONE 5 MG PO TABS: 5.0000 mg | ORAL_TABLET | Freq: Two times a day (BID) | ORAL | Status: DC

## 2014-12-26 MED ORDER — AMLODIPINE BESYLATE 5 MG PO TABS: 5.0000 mg | ORAL_TABLET | Freq: Every day | ORAL | Status: DC

## 2014-12-26 MED ORDER — CELECOXIB 200 MG PO CAPS: 200.0000 mg | ORAL_CAPSULE | Freq: Every day | ORAL | Status: DC | PRN

## 2014-12-26 MED ORDER — OMEPRAZOLE 40 MG PO CPDR: 40.0000 mg | DELAYED_RELEASE_CAPSULE | Freq: Every day | ORAL | Status: DC

## 2014-12-26 NOTE — Addendum Note (Signed)
Addended by: Kelle Darting A on: 12/26/2014 09:39 AM   Modules accepted: Orders

## 2014-12-26 NOTE — Assessment & Plan Note (Signed)
Uncontrolled. Refill prednisone. She is using a lot of ibuprofen, advised pt to d/c ibuprofen. Add omeprazole for GI prophylaxis.  Add celebrex prn- if mild pain advised pt to use tylenol first.  Attempted to refer to rheumatology.  Unfortunately because her secondary insurance is [employer], this referral needs to come from her assigned provider whom she has not met and who is apparently in [place]. Her daughter is going to help her try to get either her secondary insurance changed or get her in to see assigned provider so new rheumatology referral can be made.

## 2014-12-26 NOTE — Patient Instructions (Addendum)
Stop ibuprofen, start omeprazole (to protect stomach).  You may use celebrex once daily as needed for joint pain, if pain is not severe, use tylenol instead ( max 3000mg /day). Add amlodipine once daily for blood pressure. Follow up in 1 months.

## 2014-12-26 NOTE — Progress Notes (Signed)
Pre visit review using our clinic review tool, if applicable. No additional management support is needed unless otherwise documented below in the visit note. 

## 2014-12-26 NOTE — Assessment & Plan Note (Signed)
At baseline, now on statin, plan flp next visit.  Continue ASA 325mg  once daily.

## 2014-12-26 NOTE — Assessment & Plan Note (Signed)
Uncontrolled.  Add amlodipine. Continue beta blocker. Follow up in 1 month.

## 2014-12-26 NOTE — Progress Notes (Signed)
Subjective:    [name], female    DOB: 1941/07/01, 74 y.o.   MRN: [id number]  HPI Ms. Lenhardt is here today for follow up after a hospital admission (2/7-2/9) for sudden onset of difficulty speaking. Echocardiogram showed no cardiac source of emboli,  EEG was negative for seizure activity, carotid duplex showed bilateral 1-39% ICA stenosis, MRI of brain showed No evidence for acute infarction, hemorrhage, mass lesion. She has an appointment with Dr. Leonie Man (Neurology) for 04/02/2015. She reports they added lipitor and asa to her med regimen. Reports feeling somewhat tired. She also has extra stress because her sister had MI and a stroke and is on life support in [place]. She has some increased difficulty/hesitance with speech. Since hospital discharge. Reports compliance with metoprolol. BP Readings from Last 3 Encounters:  12/26/14 160/80  12/18/14 150/57  12/07/14 140/80   Review of Systems See HPI  Past Medical History  Diagnosis Date  . Allergy   . Anemia     iron deficiency  . Depression   . Hyperlipidemia   . Hypertension   . Urinary incontinence   . Arthritis     rheumatoid  . Cataract   . Thyroid disease   . TIA (transient ischemic attack) 2016    History   Social History  . Marital Status: Divorced    Spouse Name: N/A  . Number of Children: 8  . Years of Education: N/A   Occupational History  .      Retired from [employer]  . Smoking status: Former Smoker -- 32 years    Types: E-cigarettes    Quit date: 12/13/2014  . Smokeless tobacco: Never Used  . Alcohol Use: No  . Drug Use: No  . Sexual Activity: Not on file   Other Topics Concern  . Not on file   Social History Narrative    Past Surgical History  Procedure Laterality Date  . Abdominal hysterectomy  1976  . 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 Hypertension Father   .  Heart disease Sister 24    CABG x 3  . Cirrhosis Brother   . Alcohol abuse Brother   . Colitis Daughter   . Colon polyps Daughter   . Hypertension Son   . Kidney disease Son     transplant due to kidney problems after [facility]  . Arthritis Other   . Coronary artery disease Other   . Hyperlipidemia Other   . Hypertension Other   . Stroke Other   . Arthritis Sister   . Hypertension Sister   . Thyroid disease Daughter   . Thyroid disease Daughter   . Thyroid disease Daughter     No Known Allergies  Current Outpatient Prescriptions on File Prior to Visit  Medication Sig Dispense Refill  . aspirin 325 MG tablet Take 1 tablet (325 mg total) by mouth daily.    Marland Kitchen atorvastatin (LIPITOR) 10 MG tablet Take 1 tablet (10 mg total) by mouth daily at 6 PM. 30 tablet 0  . bisacodyl (DULCOLAX) 10 MG suppository Place 1 suppository (10 mg total) rectally daily as needed for moderate constipation (if Miralax ineffective). 12 suppository 0  . buPROPion (WELLBUTRIN SR) 150 MG 12 hr tablet Take 1 tablet (150 mg total) by mouth 2 (two) times daily. (Patient taking differently: Take 150 mg  by mouth daily. ) 60 tablet 5  . folic acid (FOLVITE) 1 MG tablet Take 1 mg by mouth daily.     . Ibuprofen 200 MG CAPS Take 400 mg by mouth as needed (for pain).    Marland Kitchen levothyroxine (SYNTHROID, LEVOTHROID) 50 MCG tablet Take 1 tablet (50 mcg total) by mouth daily. 30 tablet 1  . loratadine (CLARITIN) 10 MG tablet Take 10 mg by mouth daily as needed. For allergies     . metoprolol succinate (TOPROL-XL) 50 MG 24 hr tablet TAKE 1 TABLET BY MOUTH EVERY DAY WITH OR IMMEDIATELY FOLLOWING A MEAL 90 tablet 1  . polyethylene glycol (MIRALAX) packet Take 17 g by mouth daily. 14 each 0   No current facility-administered medications on file prior to visit.    BP 160/80 mmHg  Pulse 72  Temp(Src) 98.1 F (36.7 C) (Oral)  Resp 18  Ht 5' 6.5" (1.689 m)  Wt 140 lb 12.8 oz (63.866 kg)  BMI 22.39 kg/m2  SpO2 94%         Objective:   Physical Exam  Constitutional: She is oriented to person, place, and time. She appears well-developed and well-nourished.  Cardiovascular: Normal rate and regular rhythm.   No murmur heard. Pulmonary/Chest: Effort normal and breath sounds normal. No respiratory distress. She has no wheezes. She has no rales. She exhibits no tenderness.  Musculoskeletal:  Finger joints are swollen   Neurological: She is alert and oriented to person, place, and time. She exhibits normal muscle tone. Coordination normal.  + facial symmetry, speech is clear.   Skin: Skin is warm and dry.  Psychiatric: She has a normal mood and affect. Her behavior is normal. Judgment and thought content normal.          Assessment & Plan:

## 2015-01-01 DIAGNOSIS — M810 Age-related osteoporosis without current pathological fracture: Secondary | ICD-10-CM

## 2015-01-01 HISTORY — DX: Age-related osteoporosis without current pathological fracture: M81.0

## 2015-01-01 MED ORDER — CALCIUM CARBONATE-VITAMIN D 600-400 MG-UNIT PO TABS: 1.0000 | ORAL_TABLET | Freq: Two times a day (BID) | ORAL | Status: DC

## 2015-01-03 ENCOUNTER — Telehealth: Payer: Self-pay | Admitting: *Deleted

## 2015-01-03 NOTE — Telephone Encounter (Signed)
Notified pt and she voices understanding. Pt will wait and due Vitamin D at next office visit on 01/25/15.

## 2015-01-03 NOTE — Telephone Encounter (Signed)
-----   Message from Debbrah Alar, NP sent at 01/01/2015 10:02 PM EST ----- Please let pt know that bone density shows osteoporosis.  Add Caltrate +D 600mg  bid, I would also like to check a vit D level- dx osteoporosis. Regular weight bearing exercise such as walking also good for bone health.

## 2015-01-07 ENCOUNTER — Other Ambulatory Visit: Payer: Self-pay | Admitting: Family

## 2015-01-08 ENCOUNTER — Encounter: Payer: Self-pay | Admitting: Family

## 2015-01-23 ENCOUNTER — Telehealth: Payer: Self-pay | Admitting: Family

## 2015-01-23 NOTE — Telephone Encounter (Signed)
Called pt advised her of the change

## 2015-01-23 NOTE — Telephone Encounter (Signed)
OK to cancel apt with me on Friday and make a nurse visit at the time of her lab visit for bp check please.

## 2015-01-23 NOTE — Telephone Encounter (Signed)
Caller name:Bartolini, Marguerette Relation to VX:BLTJ Call back number:[phone] Pharmacy:  Reason for call: pt has lab appt on tomorrow at 8:30 am and she is also scheduled to see Melissa on Friday at 2:00 to recheck bp. Pt states she was told that a nurse would check her bp when she comes for her labs so that she will not have to come back on Friday, states she has a sister in a coma at [facility] and she wants to spend the day with her on Friday if possible. Would like for you to call and let her know.

## 2015-01-24 ENCOUNTER — Ambulatory Visit: Payer: Medicare Other | Admitting: *Deleted

## 2015-01-24 ENCOUNTER — Telehealth: Payer: Self-pay | Admitting: *Deleted

## 2015-01-24 ENCOUNTER — Other Ambulatory Visit (INDEPENDENT_AMBULATORY_CARE_PROVIDER_SITE_OTHER): Payer: Medicare Other

## 2015-01-24 VITALS — BP 158/76 | HR 69

## 2015-01-24 DIAGNOSIS — I10 Essential (primary) hypertension: Secondary | ICD-10-CM

## 2015-01-24 DIAGNOSIS — E039 Hypothyroidism, unspecified: Secondary | ICD-10-CM

## 2015-01-24 LAB — TSH: TSH: 28.56 u[IU]/mL — ABNORMAL HIGH (ref 0.35–4.50)

## 2015-01-24 NOTE — Telephone Encounter (Signed)
FYI patient's BP was 158/76 with HR 69.  She is checking BP at home but states it is always different and she is not sure if cuff works.  Encouraged patient to bring cuff to next visit for comparison.

## 2015-01-24 NOTE — Progress Notes (Signed)
Pre visit review using our clinic review tool, if applicable. No additional management support is needed unless otherwise documented below in the visit note. 

## 2015-01-25 ENCOUNTER — Telehealth: Payer: Self-pay | Admitting: Family

## 2015-01-25 ENCOUNTER — Ambulatory Visit: Payer: Medicare Other | Admitting: Family

## 2015-01-25 DIAGNOSIS — E039 Hypothyroidism, unspecified: Secondary | ICD-10-CM

## 2015-01-25 MED ORDER — LEVOTHYROXINE SODIUM 75 MCG PO TABS: 75.0000 ug | ORAL_TABLET | Freq: Every day | ORAL | Status: DC

## 2015-01-25 NOTE — Telephone Encounter (Signed)
Notified pt and she voices understanding. Lab appt scheduled for 03/08/15 and lab order entered.

## 2015-01-25 NOTE — Telephone Encounter (Signed)
TSH is improving but shows that we still need to increase synthroid. Increase synthroid from 50 to 75 mcg, follow up tsh (dx hypothyroid) in 6 weeks.

## 2015-02-07 DIAGNOSIS — M0579 Rheumatoid arthritis with rheumatoid factor of multiple sites without organ or systems involvement: Secondary | ICD-10-CM | POA: Diagnosis not present

## 2015-02-07 DIAGNOSIS — M069 Rheumatoid arthritis, unspecified: Secondary | ICD-10-CM | POA: Diagnosis not present

## 2015-02-07 DIAGNOSIS — Z79899 Other long term (current) drug therapy: Secondary | ICD-10-CM | POA: Diagnosis not present

## 2015-02-09 ENCOUNTER — Other Ambulatory Visit: Payer: Self-pay | Admitting: Internal Medicine

## 2015-02-12 ENCOUNTER — Other Ambulatory Visit: Payer: Self-pay | Admitting: Family

## 2015-02-12 NOTE — Telephone Encounter (Signed)
Medication Detail      Disp Refills Start End     levothyroxine (SYNTHROID, LEVOTHROID) 75 MCG tablet 30 tablet 1 01/25/2015     Sig - Route: Take 1 tablet (75 mcg total) by mouth daily. - Oral    E-Prescribing Status: Receipt confirmed by pharmacy (01/25/2015 7:14 AM EDT)     Pharmacy    [id number] - [place], [place] request Denied; Last Rx to pharmacy 03.18.16 with 1 remaining refill/SLS

## 2015-02-13 ENCOUNTER — Telehealth: Payer: Self-pay | Admitting: Family

## 2015-02-13 DIAGNOSIS — D649 Anemia, unspecified: Secondary | ICD-10-CM

## 2015-02-13 NOTE — Telephone Encounter (Signed)
Left detailed message on pt's voicemail to call and schedule lab appt or ask for me if she has any questions. IFOB has been given to the lab to give to pt when she returns for lab work.

## 2015-02-13 NOTE — Telephone Encounter (Signed)
Received lab work from rheumatology, notes anemia.  I would like her to return for blood work below.

## 2015-02-14 ENCOUNTER — Encounter (HOSPITAL_COMMUNITY): Payer: Self-pay | Admitting: Emergency Medicine

## 2015-02-14 ENCOUNTER — Emergency Department (HOSPITAL_COMMUNITY)
Admission: EM | Admit: 2015-02-14 | Discharge: 2015-02-14 | Disposition: A | Discharge status: Home / Self Care | Payer: Medicare Other | Attending: Emergency Medicine | Admitting: Emergency Medicine

## 2015-02-14 ENCOUNTER — Emergency Department (HOSPITAL_COMMUNITY): Payer: Medicare Other

## 2015-02-14 ENCOUNTER — Other Ambulatory Visit: Payer: Self-pay | Admitting: Family

## 2015-02-14 DIAGNOSIS — R55 Syncope and collapse: Secondary | ICD-10-CM | POA: Insufficient documentation

## 2015-02-14 DIAGNOSIS — Z8673 Personal history of transient ischemic attack (TIA), and cerebral infarction without residual deficits: Secondary | ICD-10-CM | POA: Insufficient documentation

## 2015-02-14 DIAGNOSIS — M069 Rheumatoid arthritis, unspecified: Secondary | ICD-10-CM | POA: Diagnosis not present

## 2015-02-14 DIAGNOSIS — E785 Hyperlipidemia, unspecified: Secondary | ICD-10-CM | POA: Diagnosis not present

## 2015-02-14 DIAGNOSIS — Z7982 Long term (current) use of aspirin: Secondary | ICD-10-CM | POA: Diagnosis not present

## 2015-02-14 DIAGNOSIS — Z8669 Personal history of other diseases of the nervous system and sense organs: Secondary | ICD-10-CM | POA: Diagnosis not present

## 2015-02-14 DIAGNOSIS — M810 Age-related osteoporosis without current pathological fracture: Secondary | ICD-10-CM | POA: Insufficient documentation

## 2015-02-14 DIAGNOSIS — Z7952 Long term (current) use of systemic steroids: Secondary | ICD-10-CM | POA: Diagnosis not present

## 2015-02-14 DIAGNOSIS — Z862 Personal history of diseases of the blood and blood-forming organs and certain disorders involving the immune mechanism: Secondary | ICD-10-CM | POA: Diagnosis not present

## 2015-02-14 DIAGNOSIS — F329 Major depressive disorder, single episode, unspecified: Secondary | ICD-10-CM | POA: Diagnosis not present

## 2015-02-14 DIAGNOSIS — Z79899 Other long term (current) drug therapy: Secondary | ICD-10-CM | POA: Insufficient documentation

## 2015-02-14 DIAGNOSIS — R51 Headache: Secondary | ICD-10-CM | POA: Diagnosis not present

## 2015-02-14 DIAGNOSIS — I10 Essential (primary) hypertension: Secondary | ICD-10-CM | POA: Insufficient documentation

## 2015-02-14 LAB — CBC
HCT: 36.4 % (ref 36.0–46.0)
Hemoglobin: 11.3 g/dL — ABNORMAL LOW (ref 12.0–15.0)
MCH: 25 pg — ABNORMAL LOW (ref 26.0–34.0)
MCHC: 31 g/dL (ref 30.0–36.0)
MCV: 80.5 fL (ref 78.0–100.0)
Platelets: 306 10*3/uL (ref 150–400)
RBC: 4.52 MIL/uL (ref 3.87–5.11)
RDW: 17.8 % — ABNORMAL HIGH (ref 11.5–15.5)
WBC: 11.6 10*3/uL — ABNORMAL HIGH (ref 4.0–10.5)

## 2015-02-14 LAB — BASIC METABOLIC PANEL
Anion gap: 11 (ref 5–15)
BUN: 16 mg/dL (ref 6–23)
CO2: 23 mmol/L (ref 19–32)
Calcium: 8.8 mg/dL (ref 8.4–10.5)
Chloride: 104 mmol/L (ref 96–112)
Creatinine, Ser: 0.9 mg/dL (ref 0.50–1.10)
GFR calc Af Amer: 72 mL/min — ABNORMAL LOW (ref >90)
GFR calc non Af Amer: 62 mL/min — ABNORMAL LOW (ref >90)
Glucose, Bld: 160 mg/dL — ABNORMAL HIGH (ref 70–99)
Potassium: 3.2 mmol/L — ABNORMAL LOW (ref 3.5–5.1)
Sodium: 138 mmol/L (ref 135–145)

## 2015-02-14 LAB — I-STAT TROPONIN, ED: Troponin i, poc: 0 ng/mL (ref 0.00–0.08)

## 2015-02-14 NOTE — ED Notes (Signed)
Warm blanket given

## 2015-02-14 NOTE — ED Notes (Signed)
Brought pt to room via wheelchair; pt undressed, in gown, on monitor, continuous pulse oximetry and blood pressure cuff

## 2015-02-14 NOTE — Discharge Instructions (Signed)
Near-Syncope °Near-syncope (commonly known as near fainting) is sudden weakness, dizziness, or feeling like you might pass out. During an episode of near-syncope, you may also develop pale skin, have tunnel vision, or feel sick to your stomach (nauseous). Near-syncope may occur when getting up after sitting or while standing for a long time. It is caused by a sudden decrease in blood flow to the brain. This decrease can result from various causes or triggers, most of which are not serious. However, because near-syncope can sometimes be a sign of something serious, a medical evaluation is required. The specific cause is often not determined. °HOME CARE INSTRUCTIONS  °Monitor your condition for any changes. The following actions may help to alleviate any discomfort you are experiencing: °· Have someone stay with you until you feel stable. °· Lie down right away and prop your feet up if you start feeling like you might faint. Breathe deeply and steadily. Wait until all the symptoms have passed. Most of these episodes last only a few minutes. You may feel tired for several hours.   °· Drink enough fluids to keep your urine clear or pale yellow.   °· If you are taking blood pressure or heart medicine, get up slowly when seated or lying down. Take several minutes to sit and then stand. This can reduce dizziness. °· Follow up with your health care provider as directed.  °SEEK IMMEDIATE MEDICAL CARE IF:  °· You have a severe headache.   °· You have unusual pain in the chest, abdomen, or back.   °· You are bleeding from the mouth or rectum, or you have black or tarry stool.   °· You have an irregular or very fast heartbeat.   °· You have repeated fainting or have seizure-like jerking during an episode.   °· You faint when sitting or lying down.   °· You have confusion.   °· You have difficulty walking.   °· You have severe weakness.   °· You have vision problems.   °MAKE SURE YOU:  °· Understand these instructions. °· Will  watch your condition. °· Will get help right away if you are not doing well or get worse. °Document Released: 10/26/2005 Document Revised: 10/31/2013 Document Reviewed: 03/31/2013 °ExitCare® Patient Information ©2015 [employer]. This information is not intended to replace advice given to you by your health care provider. Make sure you discuss any questions you have with your health care provider. ° °Vasovagal Syncope, Adult °Syncope, commonly known as fainting, is a temporary loss of consciousness. It occurs when the blood flow to the brain is reduced. Vasovagal syncope (also called neurocardiogenic syncope) is a fainting spell in which the blood flow to the brain is reduced because of a sudden drop in heart rate and blood pressure. Vasovagal syncope occurs when the brain and the cardiovascular system (blood vessels) do not adequately communicate and respond to each other. This is the most common cause of fainting. It often occurs in response to fear or some other type of emotional or physical stress. The body has a reaction in which the heart starts beating too slowly or the blood vessels expand, reducing blood pressure. This type of fainting spell is generally considered harmless. However, injuries can occur if a person takes a sudden fall during a fainting spell.  °CAUSES  °Vasovagal syncope occurs when a person's blood pressure and heart rate decrease suddenly, usually in response to a trigger. Many things and situations can trigger an episode. Some of these include:  °· Pain.   °· Fear.   °· The sight of blood or   medical procedures, such as blood being drawn from a vein.   °· Common activities, such as coughing, swallowing, stretching, or going to the bathroom.   °· Emotional stress.   °· Prolonged standing, especially in a warm environment.   °· Lack of sleep or rest.   °· Prolonged lack of food.   °· Prolonged lack of fluids.   °· Recent illness. °· The use of certain drugs that affect blood pressure, such  as cocaine, alcohol, marijuana, inhalants, and opiates.   °SYMPTOMS  °Before the fainting episode, you may:  °· Feel dizzy or light headed.   °· Become pale. °· Sense that you are going to faint.   °· Feel like the room is spinning.   °· Have tunnel vision, only seeing directly in front of you.   °· Feel sick to your stomach (nauseous).   °· See spots or slowly lose vision.   °· Hear ringing in your ears.   °· Have a headache.   °· Feel warm and sweaty.   °· Feel a sensation of pins and needles. °During the fainting spell, you will generally be unconscious for no longer than a couple minutes before waking up and returning to normal. If you get up too quickly before your body can recover, you may faint again. Some twitching or jerky movements may occur during the fainting spell.  °DIAGNOSIS  °Your caregiver will ask about your symptoms, take a medical history, and perform a physical exam. Various tests may be done to rule out other causes of fainting. These may include blood tests and tests to check the heart, such as electrocardiography, echocardiography, and possibly an electrophysiology study. When other causes have been ruled out, a test may be done to check the body's response to changes in position (tilt table test). °TREATMENT  °Most cases of vasovagal syncope do not require treatment. Your caregiver may recommend ways to avoid fainting triggers and may provide home strategies for preventing fainting. If you must be exposed to a possible trigger, you can drink additional fluids to help reduce your chances of having an episode of vasovagal syncope. If you have warning signs of an oncoming episode, you can respond by positioning yourself favorably (lying down). °If your fainting spells continue, you may be given medicines to prevent fainting. Some medicines may help make you more resistant to repeated episodes of vasovagal syncope. Special exercises or compression stockings may be recommended. In rare cases, the  surgical placement of a pacemaker is considered. °HOME CARE INSTRUCTIONS  °· Learn to identify the warning signs of vasovagal syncope.   °· Sit or lie down at the first warning sign of a fainting spell. If sitting, put your head down between your legs. If you lie down, swing your legs up in the air to increase blood flow to the brain.   °· Avoid hot tubs and saunas. °· Avoid prolonged standing. °· Drink enough fluids to keep your urine clear or pale yellow. Avoid caffeine. °· Increase salt in your diet as directed by your caregiver.   °· If you have to stand for a long time, perform movements such as:   °¨ Crossing your legs.   °¨ Flexing and stretching your leg muscles.   °¨ Squatting.   °¨ Moving your legs.   °¨ Bending over.   °· Only take over-the-counter or prescription medicines as directed by your caregiver. Do not suddenly stop any medicines without asking your caregiver first.  °SEEK MEDICAL CARE IF:  °· Your fainting spells continue or happen more frequently in spite of treatment.   °· You lose consciousness for more than a couple minutes. °· You   have fainting spells during or after exercising or after being startled.   °· You have new symptoms that occur with the fainting spells, such as:   °¨ Shortness of breath. °¨ Chest pain.   °¨ Irregular heartbeat.   °· You have episodes of twitching or jerky movements that last longer than a few seconds. °· You have episodes of twitching or jerky movements without obvious fainting. °SEEK IMMEDIATE MEDICAL CARE IF:  °· You have injuries or bleeding after a fainting spell.   °· You have episodes of twitching or jerky movements that last longer than 5 minutes.   °· You have more than one spell of twitching or jerky movements before returning to consciousness after fainting. °MAKE SURE YOU:  °· Understand these instructions. °· Will watch your condition. °· Will get help right away if you are not doing well or get worse. °Document Released: 10/12/2012 Document  Reviewed: 10/12/2012 °ExitCare® Patient Information ©2015 [employer]. This information is not intended to replace advice given to you by your health care provider. Make sure you discuss any questions you have with your health care provider. ° °

## 2015-02-14 NOTE — ED Provider Notes (Addendum)
CSN: [id number]     Arrival date & time 02/14/15  1452 History   First MD Initiated Contact with Patient 02/14/15 1818     Chief Complaint  Patient presents with  . Near Syncope     (Consider location/radiation/quality/duration/timing/severity/associated sxs/prior Treatment) Patient is a 74 y.o. female presenting with near-syncope. The history is provided by the patient.  Near Syncope This is a recurrent problem. Pertinent negatives include no chest pain, no abdominal pain, no headaches and no shortness of breath.   patient states she was upstairs in the hospital visiting her sister. She states she then began to feel nauseous and feel like she had a bowel movement. At the same time she can feel lightheaded. She states she went in bowel movement which then felt better. She states she's had multiple episodes like this in the past.  Past Medical History  Diagnosis Date  . Allergy   . Anemia     iron deficiency  . Depression   . Hyperlipidemia   . Hypertension   . Urinary incontinence   . Arthritis     rheumatoid  . Cataract   . Thyroid disease   . TIA (transient ischemic attack) 2016  . Osteoporosis 01/01/2015   Past Surgical History  Procedure Laterality Date  . Abdominal hysterectomy  1976  . Tumor removal  last was 1976    left ankle x3   Family History  Problem Relation Age of Onset  . Hypertension Father   . Heart disease Sister 45    CABG x 3  . Cirrhosis Brother   . Alcohol abuse Brother   . Colitis Daughter   . Colon polyps Daughter   . Hypertension Son   . Kidney disease Son     transplant due to kidney problems after [facility]  . Arthritis Other   . Coronary artery disease Other   . Hyperlipidemia Other   . Hypertension Other   . Stroke Other   . Arthritis Sister   . Hypertension Sister   . Thyroid disease Daughter   . Thyroid disease Daughter   . Thyroid disease Daughter    History  Substance Use Topics  . Smoking status: Former Smoker -- 32  years    Types: E-cigarettes    Quit date: 12/13/2014  . Smokeless tobacco: Never Used  . Alcohol Use: No   OB History    No data available     Review of Systems  Constitutional: Negative for activity change and appetite change.  Eyes: Negative for pain.  Respiratory: Negative for chest tightness and shortness of breath.   Cardiovascular: Positive for near-syncope. Negative for chest pain and leg swelling.  Gastrointestinal: Negative for nausea, vomiting, abdominal pain and diarrhea.  Genitourinary: Negative for flank pain.  Musculoskeletal: Negative for back pain and neck stiffness.  Skin: Negative for rash.  Neurological: Positive for light-headedness. Negative for weakness, numbness and headaches.  Psychiatric/Behavioral: Negative for behavioral problems.      Allergies  Review of patient's allergies indicates no known allergies.  Home Medications   Prior to Admission medications   Medication Sig Start Date End Date Taking? Authorizing Provider  amLODipine (NORVASC) 5 MG tablet Take 1 tablet (5 mg total) by mouth daily. 02/14/15  Yes Debbrah Alar, NP  aspirin 325 MG tablet Take 1 tablet (325 mg total) by mouth daily. 12/18/14  Yes Debbe Odea, MD  atorvastatin (LIPITOR) 10 MG tablet Take 1 tablet (10 mg total) by mouth daily at 6 PM. 12/18/14  Yes Debbe Odea, MD  bisacodyl (DULCOLAX) 10 MG suppository Place 1 suppository (10 mg total) rectally daily as needed for moderate constipation (if Miralax ineffective). 12/18/14  Yes Debbe Odea, MD  buPROPion (WELLBUTRIN SR) 150 MG 12 hr tablet Take 1 tablet (150 mg total) by mouth 2 (two) times daily. Patient taking differently: Take 150 mg by mouth daily.  12/07/14  Yes Debbrah Alar, NP  Calcium Carbonate-Vitamin D (CALTRATE 600+D) 600-400 MG-UNIT per tablet Take 1 tablet by mouth 2 (two) times daily. 01/01/15  Yes Debbrah Alar, NP  etanercept (ENBREL) 50 MG/ML injection Inject 50 mg into the skin once a week.   Yes  Historical Provider, MD  folic acid (FOLVITE) 1 MG tablet Take 1 mg by mouth daily.    Yes Historical Provider, MD  Ibuprofen 200 MG CAPS Take 400 mg by mouth as needed (for pain).   Yes Historical Provider, MD  levothyroxine (SYNTHROID, LEVOTHROID) 75 MCG tablet Take 1 tablet (75 mcg total) by mouth daily. 01/25/15  Yes Debbrah Alar, NP  loratadine (CLARITIN) 10 MG tablet Take 10 mg by mouth daily as needed. For allergies    Yes Historical Provider, MD  methotrexate (RHEUMATREX) 2.5 MG tablet Take 15 mg by mouth once a week. Caution:Chemotherapy. Protect from light. Take on Fridays   Yes Historical Provider, MD  metoprolol succinate (TOPROL-XL) 50 MG 24 hr tablet TAKE 1 TABLET BY MOUTH EVERY DAY WITH OR IMMEDIATELY FOLLOWING A MEAL 12/07/14  Yes Debbrah Alar, NP  omeprazole (PRILOSEC) 40 MG capsule Take 1 capsule (40 mg total) by mouth daily. 12/26/14  Yes Debbrah Alar, NP  polyethylene glycol (MIRALAX) packet Take 17 g by mouth daily. 12/18/14  Yes Debbe Odea, MD  predniSONE (DELTASONE) 5 MG tablet Take 1 tablet (5 mg total) by mouth 2 (two) times daily with a meal. Patient taking differently: Take 5 mg by mouth daily with breakfast.  12/26/14  Yes Debbrah Alar, NP  buPROPion (WELLBUTRIN SR) 150 MG 12 hr tablet TAKE 1 TABLET BY MOUTH TWICE A DAY Patient not taking: Reported on 02/14/2015 01/08/15   Debbrah Alar, NP  buPROPion (WELLBUTRIN SR) 150 MG 12 hr tablet TAKE ONE TABLET BY MOUTH DAILY FOR ONE WEEK, THEN TWICE DAILY Patient not taking: Reported on 02/14/2015 01/08/15   Debbrah Alar, NP  celecoxib (CELEBREX) 200 MG capsule Take 1 capsule (200 mg total) by mouth daily as needed. For joint pain Patient not taking: Reported on 02/14/2015 12/26/14   Debbrah Alar, NP   BP 130/66 mmHg  Pulse 69  Temp(Src) 97.6 F (36.4 C) (Oral)  Resp 20  Ht 5\' 6"  (1.676 m)  Wt 139 lb (63.05 kg)  BMI 22.45 kg/m2  SpO2 99% Physical Exam  Constitutional: She is oriented to  person, place, and time. She appears well-developed and well-nourished.  HENT:  Head: Normocephalic and atraumatic.  Eyes: Pupils are equal, round, and reactive to light.  Neck: Normal range of motion. No JVD present.  Cardiovascular: Normal rate, regular rhythm and normal heart sounds.   Pulmonary/Chest: Effort normal and breath sounds normal. No respiratory distress.  Abdominal: Soft. Bowel sounds are normal.  Musculoskeletal: Normal range of motion.  Neurological: She is alert and oriented to person, place, and time. No cranial nerve deficit.  Skin: Skin is warm and dry.  Psychiatric: She has a normal mood and affect. Her speech is normal.  Nursing note and vitals reviewed.   ED Course  Procedures (including critical care time) Labs Review Labs Reviewed  CBC - Abnormal; Notable for the following:    WBC 11.6 (*)    Hemoglobin 11.3 (*)    MCH 25.0 (*)    RDW 17.8 (*)    All other components within normal limits  BASIC METABOLIC PANEL - Abnormal; Notable for the following:    Potassium 3.2 (*)    Glucose, Bld 160 (*)    GFR calc non Af Amer 62 (*)    GFR calc Af Amer 72 (*)    All other components within normal limits  I-STAT TROPOININ, ED    Imaging Review Dg Chest 2 View  02/14/2015   CLINICAL DATA:  Headache, near-syncope  EXAM: CHEST  2 VIEW  COMPARISON:  08/09/2012  FINDINGS: Cardiomediastinal silhouette is stable. No acute infiltrate or pleural effusion. No pulmonary edema. Osteopenia and mild degenerative changes mid thoracic spine.  IMPRESSION: No active cardiopulmonary disease.   Electronically Signed   By: Lahoma Crocker M.D.   On: 02/14/2015 17:19     EKG Interpretation   Date/Time:  Thursday February 14 2015 15:04:39 EDT Ventricular Rate:  69 PR Interval:  136 QRS Duration: 80 QT Interval:  414 QTC Calculation: 443 R Axis:   92 Text Interpretation:  Normal sinus rhythm Rightward axis Borderline ECG  Confirmed by Alvino Chapel  MD, Ovid Curd [phone]) on 02/14/2015 6:32:39  PM      MDM   Final diagnoses:  Pre-syncope    Patient with lightheadedness with GI symptoms. Feels better now. Likely vasovagal event. EKG overall reassuring. Negative troponin. Lab work reassuring. Will discharge home.     Davonna Belling, MD 02/14/15 [place], MD 02/28/15 [phone]

## 2015-02-14 NOTE — ED Notes (Signed)
Pt was upstairs visiting a family member when she became lightheaded and broke out in a cold sweat- pt reports she felt like she was going to pass out.  Denies syncope.  Admits to vomiting X 1.  Denies pain.  Pt alert and oriented X 4 at present.

## 2015-02-18 ENCOUNTER — Other Ambulatory Visit (INDEPENDENT_AMBULATORY_CARE_PROVIDER_SITE_OTHER): Payer: Medicare Other

## 2015-02-18 DIAGNOSIS — D649 Anemia, unspecified: Secondary | ICD-10-CM

## 2015-02-18 DIAGNOSIS — E039 Hypothyroidism, unspecified: Secondary | ICD-10-CM

## 2015-02-18 DIAGNOSIS — E538 Deficiency of other specified B group vitamins: Secondary | ICD-10-CM

## 2015-02-18 LAB — IRON: Iron: 33 ug/dL — ABNORMAL LOW (ref 42–145)

## 2015-02-19 ENCOUNTER — Telehealth: Payer: Self-pay | Admitting: Family

## 2015-02-19 LAB — IRON AND TIBC
%SAT: 10 % — ABNORMAL LOW (ref 20–55)
Iron: 34 ug/dL — ABNORMAL LOW (ref 42–145)
TIBC: 324 ug/dL (ref 250–470)
UIBC: 290 ug/dL (ref 125–400)

## 2015-02-19 LAB — TSH: TSH: 42.25 u[IU]/mL — ABNORMAL HIGH (ref 0.35–4.50)

## 2015-02-19 LAB — FERRITIN: Ferritin: 61.6 ng/mL (ref 10.0–291.0)

## 2015-02-19 LAB — FOLATE: Folate: >23.3 ng/mL

## 2015-02-19 LAB — VITAMIN B12: Vitamin B-12: 72 pg/mL — ABNORMAL LOW (ref 211–911)

## 2015-02-19 NOTE — Telephone Encounter (Signed)
Notified pt. She isn't currently taking iron supplement but will start.

## 2015-02-19 NOTE — Telephone Encounter (Signed)
Iron level is low. Is she taking supplement?  If not I would like her to add iron 325mg  twice daily

## 2015-02-20 ENCOUNTER — Telehealth: Payer: Self-pay | Admitting: Family

## 2015-02-20 DIAGNOSIS — E039 Hypothyroidism, unspecified: Secondary | ICD-10-CM

## 2015-02-20 NOTE — Telephone Encounter (Signed)
Iron, b12 and thyroid testing are normal.  If she is not taking iron, please restart. If taking iron bid already- increase to tid. Is she taking synthroid?  If not restart, if she is taking regularly increase from 75 mcg to 100 mcg.  Repeat tsh in 6 weeks.  b12 is low. Start 1074mcg IM once weekly x 4 weeks, then once monthly  Keep OV in July.

## 2015-02-22 DIAGNOSIS — Z79899 Other long term (current) drug therapy: Secondary | ICD-10-CM | POA: Diagnosis not present

## 2015-02-22 MED ORDER — LEVOTHYROXINE SODIUM 100 MCG PO TABS: 100.0000 ug | ORAL_TABLET | Freq: Every day | ORAL | Status: DC

## 2015-02-22 NOTE — Telephone Encounter (Signed)
Notified pt and she voices understanding. Previous phone note indicates pt was not taking iron supplement but restarted at PCP recommendation. Pt reports compliance with synthroid and new Rx has been sent and lab order entered. Repeat TSH will be 04/05/15 at 1:30pm. Pt agreeable to start B12 injections. Weekly injections scheduled for 4/20,4/27,5/4 and 5/11. Pt will schedule monthly injection after completing injection on 03/20/15.

## 2015-02-25 ENCOUNTER — Encounter: Payer: Self-pay | Admitting: *Deleted

## 2015-02-25 ENCOUNTER — Telehealth: Payer: Self-pay | Admitting: *Deleted

## 2015-02-25 NOTE — Telephone Encounter (Signed)
Spoke with the pt on the phone and was able to get her appt rescheduled. I also stated I would send her a letter with this information and our address. She thanked me

## 2015-02-26 ENCOUNTER — Other Ambulatory Visit (INDEPENDENT_AMBULATORY_CARE_PROVIDER_SITE_OTHER): Payer: Medicare Other

## 2015-02-26 DIAGNOSIS — D649 Anemia, unspecified: Secondary | ICD-10-CM

## 2015-02-26 LAB — FECAL OCCULT BLOOD, IMMUNOCHEMICAL: Fecal Occult Bld: NEGATIVE

## 2015-02-27 ENCOUNTER — Ambulatory Visit (INDEPENDENT_AMBULATORY_CARE_PROVIDER_SITE_OTHER): Payer: Medicare Other | Admitting: *Deleted

## 2015-02-27 ENCOUNTER — Encounter: Payer: Self-pay | Admitting: Family

## 2015-02-27 DIAGNOSIS — E538 Deficiency of other specified B group vitamins: Secondary | ICD-10-CM

## 2015-02-27 MED ORDER — CYANOCOBALAMIN 1000 MCG/ML IJ SOLN
1000.0000 ug | Freq: Once | INTRAMUSCULAR | Status: AC
  Administered 2015-02-27: 1000 ug via INTRAMUSCULAR

## 2015-02-27 NOTE — Progress Notes (Signed)
Pre visit review using our clinic review tool, if applicable. No additional management support is needed unless otherwise documented below in the visit note.  Patient tolerated injection well.  

## 2015-03-06 ENCOUNTER — Ambulatory Visit (INDEPENDENT_AMBULATORY_CARE_PROVIDER_SITE_OTHER): Payer: Medicare Other | Admitting: *Deleted

## 2015-03-06 DIAGNOSIS — E538 Deficiency of other specified B group vitamins: Secondary | ICD-10-CM

## 2015-03-06 MED ORDER — CYANOCOBALAMIN 1000 MCG/ML IJ SOLN
1000.0000 ug | Freq: Once | INTRAMUSCULAR | Status: AC
  Administered 2015-03-06: 1000 ug via INTRAMUSCULAR

## 2015-03-06 NOTE — Progress Notes (Signed)
Pre visit review using our clinic review tool, if applicable. No additional management support is needed unless otherwise documented below in the visit note.  Patient tolerated injection well.  

## 2015-03-07 ENCOUNTER — Other Ambulatory Visit: Payer: Self-pay | Admitting: Family

## 2015-03-08 ENCOUNTER — Other Ambulatory Visit: Payer: Medicare Other

## 2015-03-13 ENCOUNTER — Ambulatory Visit (INDEPENDENT_AMBULATORY_CARE_PROVIDER_SITE_OTHER): Payer: Medicare Other | Admitting: *Deleted

## 2015-03-13 DIAGNOSIS — E538 Deficiency of other specified B group vitamins: Secondary | ICD-10-CM | POA: Diagnosis not present

## 2015-03-13 MED ORDER — CYANOCOBALAMIN 1000 MCG/ML IJ SOLN
1000.0000 ug | Freq: Once | INTRAMUSCULAR | Status: AC
  Administered 2015-03-13: 1000 ug via INTRAMUSCULAR

## 2015-03-13 NOTE — Progress Notes (Signed)
Pre visit review using our clinic review tool, if applicable. No additional management support is needed unless otherwise documented below in the visit note.   Patient tolerated injection well.  Next injection scheduled for 03/21/15.

## 2015-03-14 DIAGNOSIS — H18413 Arcus senilis, bilateral: Secondary | ICD-10-CM | POA: Diagnosis not present

## 2015-03-14 DIAGNOSIS — H40033 Anatomical narrow angle, bilateral: Secondary | ICD-10-CM | POA: Diagnosis not present

## 2015-03-20 ENCOUNTER — Ambulatory Visit: Payer: Medicare Other

## 2015-03-21 ENCOUNTER — Ambulatory Visit (INDEPENDENT_AMBULATORY_CARE_PROVIDER_SITE_OTHER): Payer: Medicare Other

## 2015-03-21 ENCOUNTER — Other Ambulatory Visit: Payer: Self-pay | Admitting: Family

## 2015-03-21 DIAGNOSIS — E538 Deficiency of other specified B group vitamins: Secondary | ICD-10-CM | POA: Diagnosis not present

## 2015-03-21 MED ORDER — CYANOCOBALAMIN 1000 MCG/ML IJ SOLN
1000.0000 ug | Freq: Once | INTRAMUSCULAR | Status: AC
  Administered 2015-03-21: 1000 ug via INTRAMUSCULAR

## 2015-03-21 NOTE — Progress Notes (Signed)
Pre visit review using our clinic review tool, if applicable. No additional management support is needed unless otherwise documented below in the visit note.  Patient tolerated injection well.  

## 2015-04-02 ENCOUNTER — Ambulatory Visit: Payer: Self-pay | Admitting: Neurology

## 2015-04-05 ENCOUNTER — Other Ambulatory Visit: Payer: Medicare Other

## 2015-04-16 DIAGNOSIS — H25813 Combined forms of age-related cataract, bilateral: Secondary | ICD-10-CM | POA: Diagnosis not present

## 2015-04-16 DIAGNOSIS — H31092 Other chorioretinal scars, left eye: Secondary | ICD-10-CM | POA: Diagnosis not present

## 2015-04-16 DIAGNOSIS — H40013 Open angle with borderline findings, low risk, bilateral: Secondary | ICD-10-CM | POA: Diagnosis not present

## 2015-04-16 DIAGNOSIS — H40033 Anatomical narrow angle, bilateral: Secondary | ICD-10-CM | POA: Diagnosis not present

## 2015-04-20 ENCOUNTER — Other Ambulatory Visit: Payer: Self-pay | Admitting: Family

## 2015-05-02 DIAGNOSIS — H268 Other specified cataract: Secondary | ICD-10-CM | POA: Diagnosis not present

## 2015-05-02 DIAGNOSIS — H2511 Age-related nuclear cataract, right eye: Secondary | ICD-10-CM | POA: Diagnosis not present

## 2015-05-09 DIAGNOSIS — M0579 Rheumatoid arthritis with rheumatoid factor of multiple sites without organ or systems involvement: Secondary | ICD-10-CM | POA: Diagnosis not present

## 2015-05-09 DIAGNOSIS — Z79899 Other long term (current) drug therapy: Secondary | ICD-10-CM | POA: Diagnosis not present

## 2015-05-15 ENCOUNTER — Ambulatory Visit (INDEPENDENT_AMBULATORY_CARE_PROVIDER_SITE_OTHER): Payer: Medicare Other | Admitting: Neurology

## 2015-05-15 ENCOUNTER — Encounter: Payer: Self-pay | Admitting: Neurology

## 2015-05-15 VITALS — BP 147/75 | HR 71 | Ht 66.0 in | Wt 137.8 lb

## 2015-05-15 DIAGNOSIS — E785 Hyperlipidemia, unspecified: Secondary | ICD-10-CM | POA: Diagnosis not present

## 2015-05-15 DIAGNOSIS — R55 Syncope and collapse: Secondary | ICD-10-CM | POA: Diagnosis not present

## 2015-05-15 DIAGNOSIS — G458 Other transient cerebral ischemic attacks and related syndromes: Secondary | ICD-10-CM | POA: Diagnosis not present

## 2015-05-15 NOTE — Patient Instructions (Signed)
I had a long d/w patient  And daughter about her recent episode of possible TIA, risk for recurrent stroke/TIAs, personally independently reviewed imaging studies and stroke evaluation results and answered questions.Continue aspirin 325 mg orally every day  for secondary stroke prevention and maintain strict control of hypertension with blood pressure goal below 130/90, diabetes with hemoglobin A1c goal below 6.5% and lipids with LDL cholesterol goal below 100 mg/dL. given the fact that she is had to somewhat similar episodes in the last 3 years raising the possibility of pre-syncopal episodes versus arrhythmias will check cardiac event monitor and repeat EEG. Check follow up lipid profile and LFTs. Greater than 50% of this 25 minute visit was spent in counseling and coordination of care Followup in the future with me in 3 months or call earlier if necessary.

## 2015-05-15 NOTE — Progress Notes (Signed)
[facility] [address] [place]. [place] [address] [phone]       OFFICE FOLLOW-UP NOTE  Nancy [name]  Date of Birth:  February 12, 1941 Medical Record Number:  [id number]   HPI: 61 year is an elderly lady seen today for first office follow-up visit following admission for TIA on 12/16/14. She presented with the transient speech output difficulties as well as confusion followed by some tremulousness. Symptoms lasted around 30-45 minutes and started resolving by the time she reached the hospital. On arrival she had no focal deficits. MRI scan of the brain showed no acute infarct and changes of small vessel disease. 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 Hemoglobin A1c was 6.6. Transthoracic echo was unremarkable. Carotid ultrasound showed no significant extra-axial stenosis. LDL cholesterol was elevated at 121. The episode was felt to also possibly represent a seizure and EEG was obtained which was normal. She started on aspirin which is tolerating well without bleeding or bruising. She today and found that she had another episode 3 years ago while in church she was walking outside she felt weak all over and she lost her voice and had to sit down for a little while. She was seen at [facility] where blood pressure was found to be significantly elevated. Last year in September she had only episode where she felt disoriented and confused and nauseous but did not pass out. Patient has not been evaluated with cardiac event monitor for arrhythmias yet. She remains on Lipitor she is tolerating well without side effects. She is also on aspirin without bleeding or bruising.  ROS:   14 system review of systems is positive for  constipation, neck pain, neck stiffness, joint pain and swelling, frequency of urination, anxiety and all other systems negative  PMH:  Past Medical History  Diagnosis Date  . Allergy   .  Anemia     iron deficiency  . Depression   . Hyperlipidemia   . Hypertension   . Urinary incontinence   . Arthritis     rheumatoid  . Cataract   . Thyroid disease   . TIA (transient ischemic attack) 2016  . Osteoporosis 01/01/2015    Social History:  History   Social History  . Marital Status: Divorced    Spouse Name: N/A  . Number of Children: 8  . Years of Education: N/A   Occupational History  .      Retired from [employer]  . Smoking status: Former Smoker -- 32 years    Types: E-cigarettes    Quit date: 12/13/2014  . Smokeless tobacco: Never Used  . Alcohol Use: No  . Drug Use: No  . Sexual Activity: Not on file   Other Topics Concern  . Not on file   Social History Narrative    Medications:   Current Outpatient Prescriptions on File Prior to Visit  Medication Sig Dispense Refill  . amLODipine (NORVASC) 5 MG tablet TAKE 1 TABLET BY MOUTH EVERY DAY 30 tablet 0  . aspirin 325 MG tablet Take 1 tablet (325 mg total) by mouth daily.    Marland Kitchen atorvastatin (LIPITOR) 10 MG tablet Take 1 tablet (10 mg total) by mouth daily at 6 PM. 30 tablet 0  . bisacodyl (DULCOLAX) 10 MG suppository Place 1 suppository (10 mg total) rectally daily as needed for moderate constipation (if Miralax ineffective). 12 suppository 0  .  Calcium Carbonate-Vitamin D (CALTRATE 600+D) 600-400 MG-UNIT per tablet Take 1 tablet by mouth 2 (two) times daily.    Marland Kitchen etanercept (ENBREL) 50 MG/ML injection Inject 50 mg into the skin once a week.    . folic acid (FOLVITE) 1 MG tablet Take 1 mg by mouth daily.     Marland Kitchen levothyroxine (SYNTHROID, LEVOTHROID) 100 MCG tablet TAKE 1 TABLET BY MOUTH EVERY DAY 30 tablet 1  . loratadine (CLARITIN) 10 MG tablet Take 10 mg by mouth daily as needed. For allergies     . methotrexate (RHEUMATREX) 2.5 MG tablet Take 15 mg by mouth once a week. Caution:Chemotherapy. Protect from light. Take on Fridays    . metoprolol succinate (TOPROL-XL) 50  MG 24 hr tablet TAKE 1 TABLET BY MOUTH EVERY DAY WITH OR IMMEDIATELY FOLLOWING A MEAL 90 tablet 1  . polyethylene glycol (MIRALAX) packet Take 17 g by mouth daily. 14 each 0  . buPROPion (WELLBUTRIN SR) 150 MG 12 hr tablet Take 1 tablet (150 mg total) by mouth 2 (two) times daily. (Patient taking differently: Take 150 mg by mouth daily. ) 60 tablet 5  . buPROPion (WELLBUTRIN SR) 150 MG 12 hr tablet TAKE 1 TABLET BY MOUTH TWICE A DAY (Patient not taking: Reported on 02/14/2015) 60 tablet 2  . buPROPion (WELLBUTRIN SR) 150 MG 12 hr tablet TAKE ONE TABLET BY MOUTH DAILY FOR ONE WEEK, THEN TWICE DAILY (Patient not taking: Reported on 02/14/2015) 60 tablet 1  . celecoxib (CELEBREX) 200 MG capsule TAKE ONE CAPSULE BY MOUTH DAILY AS NEEDED FOR JOINT PAIN 30 capsule 2  . ferrous sulfate 325 (65 FE) MG tablet Take 325 mg by mouth 2 (two) times daily.    . Ibuprofen 200 MG CAPS Take 400 mg by mouth as needed (for pain).    Marland Kitchen omeprazole (PRILOSEC) 40 MG capsule TAKE ONE CAPSULE BY MOUTH EVERY DAY (Patient not taking: Reported on 05/15/2015) 30 capsule 1  . predniSONE (DELTASONE) 5 MG tablet Take 1 tablet (5 mg total) by mouth 2 (two) times daily with a meal. (Patient not taking: Reported on 05/15/2015) 60 tablet 1   No current facility-administered medications on file prior to visit.    Allergies:  No Known Allergies  Physical Exam General: well developed, well nourished elderly Caucasian lady, seated, in no evident distress Head: head normocephalic and atraumatic.  Neck: supple with no carotid or supraclavicular bruits Cardiovascular: regular rate and rhythm, no murmurs Musculoskeletal: no deformity Skin:  no rash/petichiae Vascular:  Normal pulses all extremities Filed Vitals:   05/15/15 0905  BP: 147/75  Pulse: 71   Neurologic Exam Mental Status: Awake and fully alert. Oriented to place and time. Recent and remote memory intact. Attention span, concentration and fund of knowledge appropriate. Mood and  affect appropriate.  Cranial Nerves: Fundoscopic exam reveals sharp disc margins. Pupils equal, briskly reactive to light. Extraocular movements full without nystagmus. Visual fields full to confrontation. Hearing intact. Facial sensation intact. Face, tongue, palate moves normally and symmetrically.  Motor: Normal bulk and tone. Normal strength in all tested extremity muscles. Sensory.: intact to touch ,pinprick .position and vibratory sensation.  Coordination: Rapid alternating movements normal in all extremities. Finger-to-nose and heel-to-shin performed accurately bilaterally. Gait and Station: Arises from chair without difficulty. Stance is normal. Gait demonstrates normal stride length and balance . Able to heel, toe and tandem walk without difficulty.  Reflexes: 1+ and symmetric. Toes downgoing.   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 2 other episodes of possible near syncope which need evaluation    PLAN: I had a long d/w patient  And daughter about her recent episode of possible TIA, risk for recurrent stroke/TIAs, personally independently reviewed imaging studies and stroke evaluation results and answered questions.Continue aspirin 325 mg orally every day  for secondary stroke prevention and maintain strict control of hypertension with blood pressure goal below 130/90, diabetes with hemoglobin A1c goal below 6.5% and lipids with LDL cholesterol goal below 100 mg/dL. given the fact that she is had to somewhat similar episodes in the last 3 years raising the possibility of pre-syncopal episodes versus arrhythmias will check cardiac event monitor and repeat EEG. Check follow up lipid profile and LFTs. Greater than 50% of this 25 minute visit was spent in counseling and coordination of care Followup in the future with me in 3  months or call earlier if necessary.  Antony Contras, MD   Note: This document was prepared with digital dictation and possible smart phrase technology. Any transcriptional errors that result from this process are unintentional

## 2015-05-16 ENCOUNTER — Telehealth: Payer: Self-pay

## 2015-05-16 LAB — HEPATIC FUNCTION PANEL
ALT: 7 IU/L (ref 0–32)
AST: 14 IU/L (ref 0–40)
Albumin: 4.3 g/dL (ref 3.5–4.8)
Alkaline Phosphatase: 62 IU/L (ref 39–117)
Bilirubin Total: 0.3 mg/dL (ref 0.0–1.2)
Bilirubin, Direct: 0.08 mg/dL (ref 0.00–0.40)
Total Protein: 7.2 g/dL (ref 6.0–8.5)

## 2015-05-16 LAB — LIPID PANEL
Chol/HDL Ratio: 5.3 ratio units — ABNORMAL HIGH (ref 0.0–4.4)
Cholesterol, Total: 212 mg/dL — ABNORMAL HIGH (ref 100–199)
HDL: 40 mg/dL (ref >39)
LDL Calculated: 140 mg/dL — ABNORMAL HIGH (ref 0–99)
Triglycerides: 162 mg/dL — ABNORMAL HIGH (ref 0–149)
VLDL Cholesterol Cal: 32 mg/dL (ref 5–40)

## 2015-05-16 LAB — HEMOGLOBIN A1C
Est. average glucose Bld gHb Est-mCnc: 134 mg/dL
Hgb A1c MFr Bld: 6.3 % — ABNORMAL HIGH (ref 4.8–5.6)

## 2015-05-16 NOTE — Telephone Encounter (Signed)
Spoke with patient about cholesterol level at 212, she was informed to increase Lipitor to 20 mg daily, her prediabetes test was stable.  Patient verbalized understanding.

## 2015-05-17 ENCOUNTER — Other Ambulatory Visit: Payer: Self-pay | Admitting: Neurology

## 2015-05-17 ENCOUNTER — Telehealth: Payer: Self-pay

## 2015-05-17 MED ORDER — ATORVASTATIN CALCIUM 10 MG PO TABS: 20.0000 mg | ORAL_TABLET | Freq: Every day | ORAL | Status: DC

## 2015-05-17 NOTE — Telephone Encounter (Signed)
-----   Message from Minna Antis, RN sent at 05/16/2015  8:47 AM EDT -----   ----- Message -----    From: Garvin Fila, MD    Sent: 05/16/2015   8:33 AM      To: Minna Antis, RN, Debbrah Alar, NP  Kindly inform patient that bad cholesterol is unsatisfactory and to increase Lipitor dose to 20 mg daily. Blood test for prediabetes is stable.

## 2015-05-17 NOTE — Telephone Encounter (Signed)
Spoke to patient. Gave instructions per Dr. Clydene Fake previous note. Patient states she will need another Rx for Lipitor.

## 2015-05-17 NOTE — Telephone Encounter (Signed)
Ok will order lipitor 20 mg

## 2015-06-04 ENCOUNTER — Ambulatory Visit (INDEPENDENT_AMBULATORY_CARE_PROVIDER_SITE_OTHER): Payer: Medicare Other | Admitting: Neurology

## 2015-06-04 DIAGNOSIS — R55 Syncope and collapse: Secondary | ICD-10-CM | POA: Diagnosis not present

## 2015-06-04 NOTE — Procedures (Signed)
    History:  Nancy [name] is a 74 year old patient with a history of possible TIA events. The patient has had episodes of near-syncope, and generalized weakness. The patient being evaluated for these episodes.  This is a routine EEG. No skull defects are noted. Medications include Norvasc, aspirin, Lipitor, Wellbutrin, calcium supplementation, Celebrex, Enbrel, iron supplementation, folic acid, Synthroid, Claritin, methotrexate, metoprolol, Prilosec, MiraLAX, and prednisone.   EEG classification: Normal awake  Description of the recording: The background rhythms of this recording consists of a fairly well modulated medium amplitude alpha rhythm of 10 Hz that is reactive to eye opening and closure. As the record progresses, the patient appears to remain in the waking state throughout the recording. Photic stimulation was not performed. Hyperventilation was performed, resulting in a minimal buildup of the background rhythm activities without significant slowing seen. At no time during the recording does there appear to be evidence of spike or spike wave discharges or evidence of focal slowing. EKG monitor shows no evidence of cardiac rhythm abnormalities with a heart rate of 60.  Impression: This is a normal EEG recording in the waking state. No evidence of ictal or interictal discharges are seen.

## 2015-06-07 ENCOUNTER — Encounter: Payer: Self-pay | Admitting: Family

## 2015-06-07 ENCOUNTER — Ambulatory Visit (INDEPENDENT_AMBULATORY_CARE_PROVIDER_SITE_OTHER): Payer: Medicare Other | Admitting: Family

## 2015-06-07 VITALS — BP 130/78 | HR 65 | Temp 97.8°F | Resp 16 | Ht 66.5 in | Wt 136.4 lb

## 2015-06-07 DIAGNOSIS — M069 Rheumatoid arthritis, unspecified: Secondary | ICD-10-CM

## 2015-06-07 DIAGNOSIS — E039 Hypothyroidism, unspecified: Secondary | ICD-10-CM | POA: Diagnosis not present

## 2015-06-07 DIAGNOSIS — I10 Essential (primary) hypertension: Secondary | ICD-10-CM | POA: Diagnosis not present

## 2015-06-07 DIAGNOSIS — D649 Anemia, unspecified: Secondary | ICD-10-CM | POA: Diagnosis not present

## 2015-06-07 DIAGNOSIS — D509 Iron deficiency anemia, unspecified: Secondary | ICD-10-CM

## 2015-06-07 DIAGNOSIS — F329 Major depressive disorder, single episode, unspecified: Secondary | ICD-10-CM

## 2015-06-07 DIAGNOSIS — F32A Depression, unspecified: Secondary | ICD-10-CM

## 2015-06-07 DIAGNOSIS — R739 Hyperglycemia, unspecified: Secondary | ICD-10-CM | POA: Insufficient documentation

## 2015-06-07 LAB — BASIC METABOLIC PANEL
BUN: 14 mg/dL (ref 6–23)
CO2: 25 mEq/L (ref 19–32)
Calcium: 9.3 mg/dL (ref 8.4–10.5)
Chloride: 105 mEq/L (ref 96–112)
Creatinine, Ser: 0.67 mg/dL (ref 0.40–1.20)
GFR: 110.59 mL/min (ref >60.00)
Glucose, Bld: 90 mg/dL (ref 70–99)
Potassium: 3.7 mEq/L (ref 3.5–5.1)
Sodium: 139 mEq/L (ref 135–145)

## 2015-06-07 LAB — TSH: TSH: 5.77 u[IU]/mL — ABNORMAL HIGH (ref 0.35–4.50)

## 2015-06-07 LAB — CBC WITH DIFFERENTIAL/PLATELET
Basophils Absolute: 0.1 10*3/uL (ref 0.0–0.1)
Basophils Relative: 0.6 % (ref 0.0–3.0)
Eosinophils Absolute: 0.5 10*3/uL (ref 0.0–0.7)
Eosinophils Relative: 5.3 % — ABNORMAL HIGH (ref 0.0–5.0)
HCT: 34.6 % — ABNORMAL LOW (ref 36.0–46.0)
Hemoglobin: 10.9 g/dL — ABNORMAL LOW (ref 12.0–15.0)
Lymphocytes Relative: 27.6 % (ref 12.0–46.0)
Lymphs Abs: 2.4 10*3/uL (ref 0.7–4.0)
MCHC: 31.6 g/dL (ref 30.0–36.0)
MCV: 81.1 fl (ref 78.0–100.0)
Monocytes Absolute: 0.5 10*3/uL (ref 0.1–1.0)
Monocytes Relative: 6.3 % (ref 3.0–12.0)
Neutro Abs: 5.2 10*3/uL (ref 1.4–7.7)
Neutrophils Relative %: 60.2 % (ref 43.0–77.0)
Platelets: 407 10*3/uL — ABNORMAL HIGH (ref 150.0–400.0)
RBC: 4.26 Mil/uL (ref 3.87–5.11)
RDW: 19.3 % — ABNORMAL HIGH (ref 11.5–15.5)
WBC: 8.7 10*3/uL (ref 4.0–10.5)

## 2015-06-07 LAB — IRON: Iron: 27 ug/dL — ABNORMAL LOW (ref 42–145)

## 2015-06-07 MED ORDER — METOPROLOL SUCCINATE ER 50 MG PO TB24: ORAL_TABLET | ORAL | Status: DC

## 2015-06-07 NOTE — Assessment & Plan Note (Signed)
Obtain follow up TSH level. Continue synthroid.

## 2015-06-07 NOTE — Progress Notes (Signed)
Pre visit review using our clinic review tool, if applicable. No additional management support is needed unless otherwise documented below in the visit note. 

## 2015-06-07 NOTE — Assessment & Plan Note (Signed)
This is being managed by rheumatology.  

## 2015-06-07 NOTE — Progress Notes (Signed)
Subjective:    Nancy [name] ID: Nancy Nancy [name], female    DOB: August 04, 1941, 74 y.o.   MRN: [id number]  HPI  Nancy Nancy [name] is a 74 yr old female who presents today for follow up.  Depression- she is currently maintained on wellbutrin- only taking once a daily.  Trying to cope with her los of brother/sister.   HTN- maintained on amlodipine toprol xl.  Only taking amlodipine. Feels dizzy when she takes metoprolol so she discontinued.  BP Readings from Last 3 Encounters:  06/07/15 130/78  05/15/15 147/75  02/14/15 138/65   Hypothyroid- maintained on synthroid 153mcg.   Lab Results  Component Value Date   TSH 42.25* 02/18/2015   RA-  Maintained on celebrex, enbrel, methotrexate. She stopped prednisone. She is seeing Dr. Alyse Low.   Hyperglycemia- Last A1C 6.3.   Iron def anemia- not taking iron. Didn't realize she should be taking.   Review of Systems See HPI  Past Medical History  Diagnosis Date  . Allergy   . Anemia     iron deficiency  . Depression   . Hyperlipidemia   . Hypertension   . Urinary incontinence   . Arthritis     rheumatoid  . Cataract   . Thyroid disease   . TIA (transient ischemic attack) 2016  . Osteoporosis 01/01/2015    History   Social History  . Marital Status: Divorced    Spouse Name: N/A  . Number of Children: 8  . Years of Education: N/A   Occupational History  .      Retired from [employer]  . Smoking status: Former Smoker -- 32 years    Types: E-cigarettes    Quit date: 12/13/2014  . Smokeless tobacco: Never Used  . Alcohol Use: No  . Drug Use: No  . Sexual Activity: Not on file   Other Topics Concern  . Not on file   Social History Narrative    Past Surgical History  Procedure Laterality Date  . Abdominal hysterectomy  1976  . Tumor removal  last was 1976    left ankle x3  . Cataract extraction Right     Family History  Problem Relation Age of Onset  . Hypertension Father   . Heart  disease Sister 57    CABG x 3  . Cirrhosis Brother   . Alcohol abuse Brother   . Colitis Daughter   . Colon polyps Daughter   . Hypertension Son   . Kidney disease Son     transplant due to kidney problems after [facility]  . Arthritis Other   . Coronary artery disease Other   . Hyperlipidemia Other   . Hypertension Other   . Stroke Other   . Arthritis Sister   . Hypertension Sister   . Thyroid disease Daughter   . Thyroid disease Daughter   . Thyroid disease Daughter     No Known Allergies  Current Outpatient Prescriptions on File Prior to Visit  Medication Sig Dispense Refill  . amLODipine (NORVASC) 5 MG tablet TAKE 1 TABLET BY MOUTH EVERY DAY 30 tablet 0  . aspirin 325 MG tablet Take 1 tablet (325 mg total) by mouth daily.    Marland Kitchen atorvastatin (LIPITOR) 10 MG tablet Take 2 tablets (20 mg total) by mouth daily at 6 PM. 30 tablet 0  . bisacodyl (DULCOLAX) 10 MG suppository Place 1 suppository (10 mg total) rectally daily as needed for moderate constipation (if Miralax ineffective).  12 suppository 0  . buPROPion (WELLBUTRIN SR) 150 MG 12 hr tablet Take 1 tablet (150 mg total) by mouth 2 (two) times daily. (Nancy [name] taking differently: Take 150 mg by mouth daily. ) 60 tablet 5  . Calcium Carbonate-Vitamin D (CALTRATE 600+D) 600-400 MG-UNIT per tablet Take 1 tablet by mouth 2 (two) times daily.    . celecoxib (CELEBREX) 200 MG capsule TAKE ONE CAPSULE BY MOUTH DAILY AS NEEDED FOR JOINT PAIN 30 capsule 2  . etanercept (ENBREL) 50 MG/ML injection Inject 50 mg into the skin once a week.    . ferrous sulfate 325 (65 FE) MG tablet Take 325 mg by mouth 2 (two) times daily.    . folic acid (FOLVITE) 1 MG tablet Take 1 mg by mouth daily.     Marland Kitchen levothyroxine (SYNTHROID, LEVOTHROID) 100 MCG tablet TAKE 1 TABLET BY MOUTH EVERY DAY 30 tablet 1  . loratadine (CLARITIN) 10 MG tablet Take 10 mg by mouth daily as needed. For allergies     . methotrexate (RHEUMATREX) 2.5 MG tablet Take 15 mg by mouth  once a week. Caution:Chemotherapy. Protect from light. Take on Fridays    . metoprolol succinate (TOPROL-XL) 50 MG 24 hr tablet TAKE 1 TABLET BY MOUTH EVERY DAY WITH OR IMMEDIATELY FOLLOWING A MEAL 90 tablet 1  . omeprazole (PRILOSEC) 40 MG capsule TAKE ONE CAPSULE BY MOUTH EVERY DAY 30 capsule 1  . polyethylene glycol (MIRALAX) packet Take 17 g by mouth daily. 14 each 0   No current facility-administered medications on file prior to visit.    BP 130/78 mmHg  Pulse 65  Temp(Src) 97.8 F (36.6 C) (Oral)  Resp 16  Ht 5' 6.5" (1.689 m)  Wt 136 lb 6.4 oz (61.871 kg)  BMI 21.69 kg/m2  SpO2 96%        Objective:   Physical Exam  Constitutional: She is oriented to person, place, and time. She appears well-developed and well-nourished.  HENT:  Head: Normocephalic and atraumatic.  Cardiovascular: Normal rate, regular rhythm and normal heart sounds.   No murmur heard. Pulmonary/Chest: Effort normal and breath sounds normal. No respiratory distress. She has no wheezes.  Musculoskeletal: She exhibits no edema.  Lymphadenopathy:    She has no cervical adenopathy.  Neurological: She is alert and oriented to person, place, and time.  Psychiatric: She has a normal mood and affect. Her behavior is normal. Judgment and thought content normal.          Assessment & Plan:

## 2015-06-07 NOTE — Assessment & Plan Note (Signed)
Fair control. Advised pt to take wellbutrin bid.

## 2015-06-07 NOTE — Patient Instructions (Signed)
Please complete lab work prior to leaving. OK to remain off of amlodipine. Follow up in 3 months.

## 2015-06-07 NOTE — Assessment & Plan Note (Signed)
Not taking iron. Will check cbc and iron level. Resume iron as needed.

## 2015-06-07 NOTE — Assessment & Plan Note (Signed)
Should improve off of prednisone. Monitor.

## 2015-06-09 ENCOUNTER — Telehealth: Payer: Self-pay | Admitting: Family

## 2015-06-09 DIAGNOSIS — E039 Hypothyroidism, unspecified: Secondary | ICD-10-CM

## 2015-06-09 DIAGNOSIS — H2512 Age-related nuclear cataract, left eye: Secondary | ICD-10-CM | POA: Diagnosis not present

## 2015-06-09 MED ORDER — LEVOTHYROXINE SODIUM 112 MCG PO TABS: 112.0000 ug | ORAL_TABLET | Freq: Every day | ORAL | Status: DC

## 2015-06-09 NOTE — Telephone Encounter (Signed)
Iron level is low and she is anemic.  Advise pt to start iron 325mg  PO bid.  Will repeat blood work at her follow up in October. Potassium and kidney function are normal.  Thyroid testing is improving. Increase levothyroxine from 131mcg to 112 mcg, repeat tsh in 6 weeks pls.

## 2015-06-10 NOTE — Telephone Encounter (Signed)
Left message on cell # with results and for pt to give a call back to reschedule the tsh lab appt.  Future lab order entered.

## 2015-06-13 ENCOUNTER — Other Ambulatory Visit: Payer: Self-pay | Admitting: Family

## 2015-06-13 DIAGNOSIS — H2512 Age-related nuclear cataract, left eye: Secondary | ICD-10-CM | POA: Diagnosis not present

## 2015-06-22 ENCOUNTER — Other Ambulatory Visit: Payer: Self-pay | Admitting: Neurology

## 2015-06-26 ENCOUNTER — Other Ambulatory Visit: Payer: Self-pay | Admitting: Family

## 2015-07-08 ENCOUNTER — Telehealth: Payer: Self-pay | Admitting: Family

## 2015-07-08 DIAGNOSIS — M25561 Pain in right knee: Secondary | ICD-10-CM

## 2015-07-08 DIAGNOSIS — M25562 Pain in left knee: Secondary | ICD-10-CM

## 2015-07-08 NOTE — Telephone Encounter (Signed)
Relation to pt: self Call back number:[phone]   Reason for call:   Patient requesting a referral to orthopedic due to pain/soreness in both knees

## 2015-07-08 NOTE — Telephone Encounter (Signed)
Ok to place referral? Pt was last seen on 06/07/15, as per problem list pt has rheumatoid arthritis and was referred to rheumatology in January of this year.

## 2015-07-12 DIAGNOSIS — M25562 Pain in left knee: Secondary | ICD-10-CM | POA: Diagnosis not present

## 2015-07-12 DIAGNOSIS — M25561 Pain in right knee: Secondary | ICD-10-CM | POA: Diagnosis not present

## 2015-07-19 ENCOUNTER — Telehealth: Payer: Self-pay

## 2015-07-19 NOTE — Telephone Encounter (Signed)
Called to schedule Medicare Wellness Visit with [facility].  Left a message for call back.  

## 2015-07-20 ENCOUNTER — Other Ambulatory Visit: Payer: Self-pay | Admitting: Neurology

## 2015-07-22 NOTE — Telephone Encounter (Signed)
Appt scheduled

## 2015-08-10 ENCOUNTER — Other Ambulatory Visit: Payer: Self-pay | Admitting: Family

## 2015-08-15 DIAGNOSIS — B372 Candidiasis of skin and nail: Secondary | ICD-10-CM | POA: Diagnosis not present

## 2015-08-15 DIAGNOSIS — M0579 Rheumatoid arthritis with rheumatoid factor of multiple sites without organ or systems involvement: Secondary | ICD-10-CM | POA: Diagnosis not present

## 2015-08-15 DIAGNOSIS — Z79899 Other long term (current) drug therapy: Secondary | ICD-10-CM | POA: Diagnosis not present

## 2015-08-20 ENCOUNTER — Ambulatory Visit: Payer: Medicare Other | Admitting: Neurology

## 2015-08-22 DIAGNOSIS — M0579 Rheumatoid arthritis with rheumatoid factor of multiple sites without organ or systems involvement: Secondary | ICD-10-CM | POA: Diagnosis not present

## 2015-08-22 DIAGNOSIS — M79641 Pain in right hand: Secondary | ICD-10-CM | POA: Diagnosis not present

## 2015-08-22 DIAGNOSIS — M2550 Pain in unspecified joint: Secondary | ICD-10-CM | POA: Diagnosis not present

## 2015-08-22 DIAGNOSIS — M170 Bilateral primary osteoarthritis of knee: Secondary | ICD-10-CM | POA: Diagnosis not present

## 2015-08-22 DIAGNOSIS — R0789 Other chest pain: Secondary | ICD-10-CM | POA: Diagnosis not present

## 2015-08-26 DIAGNOSIS — M170 Bilateral primary osteoarthritis of knee: Secondary | ICD-10-CM | POA: Diagnosis not present

## 2015-08-26 DIAGNOSIS — M1712 Unilateral primary osteoarthritis, left knee: Secondary | ICD-10-CM | POA: Diagnosis not present

## 2015-08-26 DIAGNOSIS — M1711 Unilateral primary osteoarthritis, right knee: Secondary | ICD-10-CM | POA: Diagnosis not present

## 2015-09-01 ENCOUNTER — Other Ambulatory Visit: Payer: Self-pay | Admitting: Family

## 2015-09-02 DIAGNOSIS — M1711 Unilateral primary osteoarthritis, right knee: Secondary | ICD-10-CM | POA: Diagnosis not present

## 2015-09-02 DIAGNOSIS — M170 Bilateral primary osteoarthritis of knee: Secondary | ICD-10-CM | POA: Diagnosis not present

## 2015-09-05 DIAGNOSIS — J209 Acute bronchitis, unspecified: Secondary | ICD-10-CM | POA: Diagnosis not present

## 2015-09-05 DIAGNOSIS — Z79899 Other long term (current) drug therapy: Secondary | ICD-10-CM | POA: Diagnosis not present

## 2015-09-05 DIAGNOSIS — R634 Abnormal weight loss: Secondary | ICD-10-CM | POA: Diagnosis not present

## 2015-09-05 DIAGNOSIS — M0579 Rheumatoid arthritis with rheumatoid factor of multiple sites without organ or systems involvement: Secondary | ICD-10-CM | POA: Diagnosis not present

## 2015-09-06 ENCOUNTER — Telehealth: Payer: Self-pay | Admitting: Family

## 2015-09-06 NOTE — Telephone Encounter (Signed)
Received 31 pages from [facility] forwarded via mail to Dr. Debbrah Alar

## 2015-09-09 ENCOUNTER — Other Ambulatory Visit: Payer: Self-pay | Admitting: Family

## 2015-09-09 ENCOUNTER — Ambulatory Visit: Payer: Medicare Other | Admitting: Family

## 2015-09-09 DIAGNOSIS — M170 Bilateral primary osteoarthritis of knee: Secondary | ICD-10-CM | POA: Diagnosis not present

## 2015-09-09 DIAGNOSIS — M1711 Unilateral primary osteoarthritis, right knee: Secondary | ICD-10-CM | POA: Diagnosis not present

## 2015-09-09 DIAGNOSIS — M1712 Unilateral primary osteoarthritis, left knee: Secondary | ICD-10-CM | POA: Diagnosis not present

## 2015-09-09 NOTE — Telephone Encounter (Signed)
Please advise below request:  Name from pharmacy:  In chart as:  CELECOXIB 200 MG CAPSULE celecoxib (CELEBREX) 200 MG capsule     Sig: TAKE ONE CAPSULE BY MOUTH EVERY DAY AS NEEDED FOR JOINT PAIN    Dispense: 30 capsule   Refills: 2   Start: 09/09/2015   Class: Normal    Requested on: 06/13/2015    Originally ordered on: 12/26/2014 08/09/2015

## 2015-09-10 ENCOUNTER — Encounter: Payer: Self-pay | Admitting: Physician Assistant

## 2015-09-10 ENCOUNTER — Encounter: Payer: Self-pay | Admitting: Family

## 2015-09-10 ENCOUNTER — Ambulatory Visit (INDEPENDENT_AMBULATORY_CARE_PROVIDER_SITE_OTHER): Payer: Medicare Other | Admitting: Family

## 2015-09-10 VITALS — BP 156/70 | HR 83 | Temp 98.2°F | Resp 18 | Ht 66.5 in | Wt 123.0 lb

## 2015-09-10 DIAGNOSIS — D518 Other vitamin B12 deficiency anemias: Secondary | ICD-10-CM | POA: Diagnosis not present

## 2015-09-10 DIAGNOSIS — Z23 Encounter for immunization: Secondary | ICD-10-CM | POA: Diagnosis not present

## 2015-09-10 DIAGNOSIS — D649 Anemia, unspecified: Secondary | ICD-10-CM | POA: Diagnosis not present

## 2015-09-10 DIAGNOSIS — R110 Nausea: Secondary | ICD-10-CM | POA: Diagnosis not present

## 2015-09-10 DIAGNOSIS — Z206 Contact with and (suspected) exposure to human immunodeficiency virus [HIV]: Secondary | ICD-10-CM

## 2015-09-10 DIAGNOSIS — L304 Erythema intertrigo: Secondary | ICD-10-CM

## 2015-09-10 DIAGNOSIS — R634 Abnormal weight loss: Secondary | ICD-10-CM | POA: Diagnosis not present

## 2015-09-10 DIAGNOSIS — R5383 Other fatigue: Secondary | ICD-10-CM

## 2015-09-10 LAB — COMPREHENSIVE METABOLIC PANEL
ALT: 11 U/L (ref 0–35)
AST: 18 U/L (ref 0–37)
Albumin: 3.6 g/dL (ref 3.5–5.2)
Alkaline Phosphatase: 84 U/L (ref 39–117)
BUN: 16 mg/dL (ref 6–23)
CO2: 27 mEq/L (ref 19–32)
Calcium: 10.2 mg/dL (ref 8.4–10.5)
Chloride: 105 mEq/L (ref 96–112)
Creatinine, Ser: 0.64 mg/dL (ref 0.40–1.20)
GFR: 116.51 mL/min (ref >60.00)
Glucose, Bld: 114 mg/dL — ABNORMAL HIGH (ref 70–99)
Potassium: 4.1 mEq/L (ref 3.5–5.1)
Sodium: 141 mEq/L (ref 135–145)
Total Bilirubin: 0.4 mg/dL (ref 0.2–1.2)
Total Protein: 7.9 g/dL (ref 6.0–8.3)

## 2015-09-10 LAB — CBC WITH DIFFERENTIAL/PLATELET
Basophils Absolute: 0 10*3/uL (ref 0.0–0.1)
Basophils Relative: 0.4 % (ref 0.0–3.0)
Eosinophils Absolute: 0.4 10*3/uL (ref 0.0–0.7)
Eosinophils Relative: 4.4 % (ref 0.0–5.0)
HCT: 37 % (ref 36.0–46.0)
Hemoglobin: 11.7 g/dL — ABNORMAL LOW (ref 12.0–15.0)
Lymphocytes Relative: 18.7 % (ref 12.0–46.0)
Lymphs Abs: 1.9 10*3/uL (ref 0.7–4.0)
MCHC: 31.6 g/dL (ref 30.0–36.0)
MCV: 79.6 fl (ref 78.0–100.0)
Monocytes Absolute: 0.6 10*3/uL (ref 0.1–1.0)
Monocytes Relative: 6.4 % (ref 3.0–12.0)
Neutro Abs: 6.9 10*3/uL (ref 1.4–7.7)
Neutrophils Relative %: 70.1 % (ref 43.0–77.0)
Platelets: 455 10*3/uL — ABNORMAL HIGH (ref 150.0–400.0)
RBC: 4.64 Mil/uL (ref 3.87–5.11)
RDW: 18.7 % — ABNORMAL HIGH (ref 11.5–15.5)
WBC: 9.9 10*3/uL (ref 4.0–10.5)

## 2015-09-10 LAB — VITAMIN B12: Vitamin B-12: >1500 pg/mL — ABNORMAL HIGH (ref 211–911)

## 2015-09-10 LAB — LIPASE: Lipase: 21 U/L (ref 11.0–59.0)

## 2015-09-10 LAB — IRON: Iron: 22 ug/dL — ABNORMAL LOW (ref 42–145)

## 2015-09-10 MED ORDER — NYSTATIN 100000 UNIT/GM EX POWD: CUTANEOUS | Status: DC

## 2015-09-10 MED ORDER — CYANOCOBALAMIN 1000 MCG/ML IJ SOLN
1000.0000 ug | Freq: Once | INTRAMUSCULAR | Status: AC
  Administered 2015-09-10: 1000 ug via INTRAMUSCULAR

## 2015-09-10 NOTE — Patient Instructions (Addendum)
Please complete lab work prior to leaving. You will be contacted about your CT scan. Add ensure one can twice daily.  Follow up in 1 month.

## 2015-09-10 NOTE — Progress Notes (Signed)
Pre visit review using our clinic review tool, if applicable. No additional management support is needed unless otherwise documented below in the visit note. 

## 2015-09-10 NOTE — Progress Notes (Signed)
Subjective:    [name], female    DOB: 08/26/1941, 74 y.o.   MRN: [id number]  HPI  Nancy [name] is a 74 yr old female with hx of RA who presents today with several concerns.  Primary concern is weight loss.  Pt has lost 13 pounds unintentionally since her last visit in July of this year. Nancy [name] developed red blotches on her legs, has had fatigue and has had some peeling skin between her breasts. Nancy [name] attributes these symptoms to use of embrel which Nancy [name] has since discontinued. Nancy [name] reports that Nancy [name] has fet so sick and weak.  Nancy [name] has been off of the Embrel x 2 months but has continued to lose weight.  Nancy [name] reports that the nausea continues.  Nancy [name] is maintained on methotrexate. Nancy [name] last saw her rheumatologist 2 weeks ago.   HTN- maintained on toprol xl.   BP Readings from Last 3 Encounters:  09/10/15 156/70  06/07/15 130/78  05/15/15 147/75   Anemia-  Has not been receiving monthly b12 injections.  Unable to tolerate iron supplement due to nausea/vomitting.  Not taking wellbutrin.  stopped >1 month ago. Denies depression. Notes that Nancy [name] is limited by her RA and has not been getting out and doing things due to how Nancy [name] has been feeling.   Review of Systems    see HPI  Past Medical History  Diagnosis Date  . Allergy   . Anemia     iron deficiency  . Depression   . Hyperlipidemia   . Hypertension   . Urinary incontinence   . Arthritis     rheumatoid  . Cataract   . Thyroid disease   . TIA (transient ischemic attack) 2016  . Osteoporosis 01/01/2015    Social History   Social History  . Marital Status: Divorced    Spouse Name: N/A  . Number of Children: 8  . Years of Education: N/A   Occupational History  .      Retired from [employer]  . Smoking status: Former Smoker -- 32 years    Types: E-cigarettes    Quit date: 12/13/2014  . Smokeless tobacco: Never Used  . Alcohol Use: No  . Drug Use: No  . Sexual Activity: Not on file    Other Topics Concern  . Not on file   Social History Narrative    Past Surgical History  Procedure Laterality Date  . Abdominal hysterectomy  1976  . Tumor removal  last was 1976    left ankle x3  . Cataract extraction Right 2016    Family History  Problem Relation Age of Onset  . Hypertension Father   . Heart disease Sister 19    CABG x 3  . Cirrhosis Brother   . Alcohol abuse Brother   . Colitis Daughter   . Colon polyps Daughter   . Hypertension Son   . Kidney disease Son     transplant due to kidney problems after [facility]  . Arthritis Other   . Coronary artery disease Other   . Hyperlipidemia Other   . Hypertension Other   . Stroke Sister     died 80  . Arthritis Sister   . Hypertension Sister   . Thyroid disease Daughter   . Thyroid disease Daughter   . Thyroid disease Daughter   . Cirrhosis Brother 65    ETOH abuse    Allergies  Allergen Reactions  . Amlodipine Other (  See Comments)    dizziness    Current Outpatient Prescriptions on File Prior to Visit  Medication Sig Dispense Refill  . aspirin 325 MG tablet Take 1 tablet (325 mg total) by mouth daily.    Marland Kitchen atorvastatin (LIPITOR) 20 MG tablet TAKE 1 TABLET BY MOUTH EVERY DAY AT 6PM 30 tablet 0  . bisacodyl (DULCOLAX) 10 MG suppository Place 1 suppository (10 mg total) rectally daily as needed for moderate constipation (if Miralax ineffective). 12 suppository 0  . Calcium Carbonate-Vitamin D (CALTRATE 600+D) 600-400 MG-UNIT per tablet Take 1 tablet by mouth 2 (two) times daily.    . celecoxib (CELEBREX) 200 MG capsule TAKE ONE CAPSULE BY MOUTH EVERY DAY AS NEEDED FOR JOINT PAIN 30 capsule 2  . ferrous sulfate 325 (65 FE) MG tablet Take 325 mg by mouth 2 (two) times daily with a meal.    . folic acid (FOLVITE) 1 MG tablet Take 1 mg by mouth daily.     Marland Kitchen loratadine (CLARITIN) 10 MG tablet Take 10 mg by mouth daily as needed. For allergies     . methotrexate (RHEUMATREX) 2.5 MG tablet Take 15 mg by  mouth once a week. Caution:Chemotherapy. Protect from light. Take on Fridays    . metoprolol succinate (TOPROL-XL) 50 MG 24 hr tablet TAKE 1 TABLET BY MOUTH EVERY DAY WITH OR IMMEDIATELY FOLLOWING A MEAL 90 tablet 1  . omeprazole (PRILOSEC) 40 MG capsule TAKE ONE CAPSULE BY MOUTH EVERY DAY 30 capsule 1  . polyethylene glycol (MIRALAX) packet Take 17 g by mouth daily. 14 each 0   No current facility-administered medications on file prior to visit.    BP 156/70 mmHg  Pulse 83  Temp(Src) 98.2 F (36.8 C) (Oral)  Resp 18  Ht 5' 6.5" (1.689 m)  Wt 123 lb (55.792 kg)  BMI 19.56 kg/m2  SpO2 100%    Objective:   Physical Exam  Constitutional: Nancy [name] is oriented to person, place, and time. No distress.  Thin AA female  HENT:  Head: Normocephalic and atraumatic.  Eyes: No scleral icterus.  Cardiovascular: Normal rate and regular rhythm.   No murmur heard. Pulmonary/Chest: Effort normal and breath sounds normal. No respiratory distress. Nancy [name] has no wheezes. Nancy [name] has no rales. Nancy [name] exhibits no tenderness.  Musculoskeletal: Nancy [name] exhibits no edema.  Lymphadenopathy:    Nancy [name] has no cervical adenopathy.  Neurological: Nancy [name] is alert and oriented to person, place, and time.  Skin: Nancy [name] is not diaphoretic.  Hyperpigmented fungal rash between breasts  Psychiatric: Nancy [name] has a normal mood and affect. Her behavior is normal. Judgment and thought content normal.          Assessment & Plan:  Intertrigo- rx with nystatin  Weight loss- concerning, will send for CT chest/abd/pelvis to evaluate for underlying malignancy. Will also obtain TSH, CMET, advised pt to add ensure one can twice daily. Pt requests repeat HIV testing today.   Nausea- refer to GI for further evaluation.   Anemia- b12 today- not tolerating PO supplement.  Obtain CBC, b12, iron

## 2015-09-11 ENCOUNTER — Telehealth: Payer: Self-pay | Admitting: Family

## 2015-09-11 DIAGNOSIS — D508 Other iron deficiency anemias: Secondary | ICD-10-CM

## 2015-09-11 LAB — HIV ANTIBODY (ROUTINE TESTING W REFLEX): HIV 1&2 Ab, 4th Generation: NONREACTIVE

## 2015-09-11 MED ORDER — LEVOTHYROXINE SODIUM 75 MCG PO TABS: 75.0000 ug | ORAL_TABLET | Freq: Every day | ORAL | Status: DC

## 2015-09-11 NOTE — Telephone Encounter (Signed)
Notified pt and she voices understanding. New Rx sent. 

## 2015-09-11 NOTE — Telephone Encounter (Signed)
Also, I received note from Dr. Gerilyn Nestle that her synthroid medication should be decreased to 49mcg based on lab result at her office.  I showed that she is on 112 mcg once daily. If this is the case, then I would recommend that she decrease to 93mcg not 50mg  (OK to send rx for 75 mcg) We will repeat TSH at her follow up in 1 month. This could be contributing to her weight loss.

## 2015-09-11 NOTE — Telephone Encounter (Signed)
Notified pt and she voices understanding and is agreeable to referral. Pt states she completed IFOB in April and it was normal, does she need to complete another one?

## 2015-09-11 NOTE — Telephone Encounter (Signed)
I didn't see that. D/c ifob. thanks

## 2015-09-11 NOTE — Telephone Encounter (Signed)
Iron level is low and she is mildly anemic.   I would like to complete an IFOB and also I would like to refer her to Dr. Marin Olp (hematology) for possible IV iron infusion since she cannot tolerate the oral medication.  HIV testing is negative. Kidney function and electrolytes are normal.  Sugar is mildly elevated but not in the diabetes range.

## 2015-09-18 ENCOUNTER — Telehealth: Payer: Self-pay | Admitting: Family

## 2015-09-18 DIAGNOSIS — Z87891 Personal history of nicotine dependence: Secondary | ICD-10-CM

## 2015-09-18 NOTE — Telephone Encounter (Signed)
Nancy [name],  I am placing order for a low dose CT which should be covered for screening purposes given her smoking history.

## 2015-09-19 ENCOUNTER — Telehealth: Payer: Self-pay | Admitting: Family

## 2015-09-19 DIAGNOSIS — R634 Abnormal weight loss: Secondary | ICD-10-CM

## 2015-09-19 NOTE — Telephone Encounter (Signed)
Caller name: W. Thomas   Relationship to patient: [employer]   Can be reached: [phone]    Case# [id number]    Reason for call:  She is calling in because pt need a PA for ct chest, she says that a peer to peer has to be done. If not, pt ct will be denied.     She says to follow the following options when calling:  opt 3, its for Peer to Peer Then, option 4- is clinical viewer

## 2015-09-22 ENCOUNTER — Telehealth: Payer: Self-pay | Admitting: Family

## 2015-09-22 NOTE — Telephone Encounter (Signed)
Nancy [name], CT scan was denied by insurance- regular dose and low dose screening.  Patient is an active smoker with unexplained weight loss.  Perhaps I ordered with the wrong code.  Could you please help me?

## 2015-09-23 NOTE — Telephone Encounter (Addendum)
Spoke to [place] [facility], they told me that they did not handle this. They had me call [phone] number below. They directed me to [facility]. [facility] said that they time frame for peer to peer expired.  They recommended that we fax an appeal to number below.  They also told me that they did not receive CT chest request. Regular CT (not low dose). Will re-order this.   [facility] [phone]   Appeal [id number] appeal number ([employer] appeals)

## 2015-09-23 NOTE — Addendum Note (Signed)
Addended by: Debbrah Alar on: 09/23/2015 01:32 PM   Modules accepted: Orders

## 2015-09-23 NOTE — Telephone Encounter (Signed)
Referral faxed to Appeals, awaiting auth

## 2015-09-23 NOTE — Telephone Encounter (Signed)
Spoke to [facility]. They said I need to request an appeal through medicare at: [id number] appeals phone number

## 2015-09-24 ENCOUNTER — Encounter: Payer: Self-pay | Admitting: Family

## 2015-09-24 ENCOUNTER — Other Ambulatory Visit: Payer: Medicaid Other

## 2015-09-24 ENCOUNTER — Ambulatory Visit: Payer: Medicaid Other

## 2015-09-24 ENCOUNTER — Ambulatory Visit: Payer: Medicare Other

## 2015-09-24 ENCOUNTER — Ambulatory Visit: Payer: Medicaid Other | Admitting: Family

## 2015-09-24 ENCOUNTER — Other Ambulatory Visit (HOSPITAL_BASED_OUTPATIENT_CLINIC_OR_DEPARTMENT_OTHER): Payer: Medicare Other

## 2015-09-24 ENCOUNTER — Ambulatory Visit (HOSPITAL_BASED_OUTPATIENT_CLINIC_OR_DEPARTMENT_OTHER): Payer: Medicare Other | Admitting: Family

## 2015-09-24 VITALS — BP 119/76 | HR 97 | Temp 97.6°F | Resp 16 | Ht 66.0 in | Wt 123.0 lb

## 2015-09-24 DIAGNOSIS — D509 Iron deficiency anemia, unspecified: Secondary | ICD-10-CM

## 2015-09-24 LAB — COMPREHENSIVE METABOLIC PANEL (CC13)
ALT: <9 U/L (ref 0–55)
AST: 14 U/L (ref 5–34)
Albumin: 3.1 g/dL — ABNORMAL LOW (ref 3.5–5.0)
Alkaline Phosphatase: 91 U/L (ref 40–150)
Anion Gap: 13 mEq/L — ABNORMAL HIGH (ref 3–11)
BUN: 17.8 mg/dL (ref 7.0–26.0)
CO2: 21 mEq/L — ABNORMAL LOW (ref 22–29)
Calcium: 10 mg/dL (ref 8.4–10.4)
Chloride: 108 mEq/L (ref 98–109)
Creatinine: 0.8 mg/dL (ref 0.6–1.1)
EGFR: 89 mL/min/{1.73_m2} — ABNORMAL LOW (ref >90)
Glucose: 104 mg/dl (ref 70–140)
Potassium: 4.1 mEq/L (ref 3.5–5.1)
Sodium: 142 mEq/L (ref 136–145)
Total Bilirubin: 0.56 mg/dL (ref 0.20–1.20)
Total Protein: 7.6 g/dL (ref 6.4–8.3)

## 2015-09-24 LAB — CBC WITH DIFFERENTIAL (CANCER CENTER ONLY)
BASO#: 0 10*3/uL (ref 0.0–0.2)
BASO%: 0.2 % (ref 0.0–2.0)
EOS%: 4.1 % (ref 0.0–7.0)
Eosinophils Absolute: 0.4 10*3/uL (ref 0.0–0.5)
HCT: 35.3 % (ref 34.8–46.6)
HGB: 11.3 g/dL — ABNORMAL LOW (ref 11.6–15.9)
LYMPH#: 1.8 10*3/uL (ref 0.9–3.3)
LYMPH%: 19.7 % (ref 14.0–48.0)
MCH: 24.9 pg — ABNORMAL LOW (ref 26.0–34.0)
MCHC: 32 g/dL (ref 32.0–36.0)
MCV: 78 fL — ABNORMAL LOW (ref 81–101)
MONO#: 0.7 10*3/uL (ref 0.1–0.9)
MONO%: 7.7 % (ref 0.0–13.0)
NEUT#: 6.3 10*3/uL (ref 1.5–6.5)
NEUT%: 68.3 % (ref 39.6–80.0)
Platelets: 452 10*3/uL — ABNORMAL HIGH (ref 145–400)
RBC: 4.54 10*6/uL (ref 3.70–5.32)
RDW: 17.7 % — ABNORMAL HIGH (ref 11.1–15.7)
WBC: 9.2 10*3/uL (ref 3.9–10.0)

## 2015-09-24 LAB — IRON AND TIBC CHCC
%SAT: 10 % — ABNORMAL LOW (ref 21–57)
Iron: 24 ug/dL — ABNORMAL LOW (ref 41–142)
TIBC: 236 ug/dL (ref 236–444)
UIBC: 213 ug/dL (ref 120–384)

## 2015-09-24 LAB — CHCC SATELLITE - SMEAR

## 2015-09-24 LAB — FERRITIN CHCC: Ferritin: 132 ng/ml (ref 9–269)

## 2015-09-24 NOTE — Progress Notes (Signed)
Hematology/Oncology Consultation   Name: Nancy [name]      MRN: [id number]    Location: Room/bed info not found  Date: 09/24/2015 Time:2:04 PM   REFERRING PHYSICIAN: Debbrah Alar, FNP  REASON FOR CONSULT: Iron deficiency anemia   DIAGNOSIS:  Iron deficiency anemia   HISTORY OF PRESENT ILLNESS: Nancy [name] is a very pleasant 74 yo African American female with history of iron deficiency anemia. Her iron saturation today is 10% with a ferritin of 132. She is symptomatic with dizziness, weakness, fatigue, SOB with or without exertion, chills at night and palpitations.  She is afraid of falling so she is staying in bed a good portion of the day.  She was unable to tolerate oral iron supplements. She denies having any episodes of bleeding or bruising.  She has severe rheumatoid arthritis and was previously on Remicade and then Embrel for 2 months. She has lost 20 lbs since July without trying. She also developed a fungal rash that has since resolved with an antifungal cream. She stopped the Embrel after 2 months and is now on methotrexate weekly.  She get filler injections in her knees periodically. She has swelling and tenderness of her joints.  She states that food makes her nauseated but she has not vomited. She admits that she needs to be drinking more fluids.  She has no personal history of cancer. Family history of cancer includes: father - colon, paternal grandmother - stomach and 7 paternal great aunts all of whom passed away from cancer - unknown primaries.  No sickle cell trait or disease in the family.  No family history of anemia.  She has 8 children all of whom she describes as healthy. She states that she did have 1 miscarriage due to stress.  She had a partial hysterectomy in 1976. No other abdominal surgeries.  She had a benign tumor removed from her left ankle 3 times the last time being in 1976.  She has no numbness or tingling in her extremities.   She has no appetite and  states that food makes her nauseated. She has not vomited.  She does enjoy water aerobics for exercise.  Her last mammogram was in September 2015 and was negative for malignancy.  Her last colonoscopy was in 2014 and she had 1 benign polyp removed.   ROS: All other 10 point review of systems is negative.   PAST MEDICAL HISTORY:   Past Medical History  Diagnosis Date  . Allergy   . Anemia     iron deficiency  . Depression   . Hyperlipidemia   . Hypertension   . Urinary incontinence   . Arthritis     rheumatoid  . Cataract   . Thyroid disease   . TIA (transient ischemic attack) 2016  . Osteoporosis 01/01/2015    ALLERGIES: Allergies  Allergen Reactions  . Amlodipine Other (See Comments)    dizziness      MEDICATIONS:  Current Outpatient Prescriptions on File Prior to Visit  Medication Sig Dispense Refill  . aspirin 325 MG tablet Take 1 tablet (325 mg total) by mouth daily.    Marland Kitchen atorvastatin (LIPITOR) 20 MG tablet TAKE 1 TABLET BY MOUTH EVERY DAY AT 6PM 30 tablet 0  . bisacodyl (DULCOLAX) 10 MG suppository Place 1 suppository (10 mg total) rectally daily as needed for moderate constipation (if Miralax ineffective). 12 suppository 0  . Calcium Carbonate-Vitamin D (CALTRATE 600+D) 600-400 MG-UNIT per tablet Take 1 tablet by mouth 2 (two) times daily.    Marland Kitchen  celecoxib (CELEBREX) 200 MG capsule TAKE ONE CAPSULE BY MOUTH EVERY DAY AS NEEDED FOR JOINT PAIN 30 capsule 2  . ferrous sulfate 325 (65 FE) MG tablet Take 325 mg by mouth 2 (two) times daily with a meal.    . folic acid (FOLVITE) 1 MG tablet Take 1 mg by mouth daily.     Marland Kitchen leflunomide (ARAVA) 10 MG tablet Take 10 mg by mouth daily.  3  . levothyroxine (SYNTHROID, LEVOTHROID) 75 MCG tablet Take 1 tablet (75 mcg total) by mouth daily. 30 tablet 1  . loratadine (CLARITIN) 10 MG tablet Take 10 mg by mouth daily as needed. For allergies     . methotrexate (RHEUMATREX) 2.5 MG tablet Take 15 mg by mouth once a week.  Caution:Chemotherapy. Protect from light. Take on Fridays    . metoprolol succinate (TOPROL-XL) 50 MG 24 hr tablet TAKE 1 TABLET BY MOUTH EVERY DAY WITH OR IMMEDIATELY FOLLOWING A MEAL 90 tablet 1  . nystatin (MYCOSTATIN/NYSTOP) 100000 UNIT/GM POWD Apply between breasts twice daily 30 g 0  . omeprazole (PRILOSEC) 40 MG capsule TAKE ONE CAPSULE BY MOUTH EVERY DAY 30 capsule 1  . polyethylene glycol (MIRALAX) packet Take 17 g by mouth daily. 14 each 0   No current facility-administered medications on file prior to visit.     PAST SURGICAL HISTORY Past Surgical History  Procedure Laterality Date  . Abdominal hysterectomy  1976  . Tumor removal  last was 1976    left ankle x3  . Cataract extraction Right 2016    FAMILY HISTORY: Family History  Problem Relation Age of Onset  . Hypertension Father   . Heart disease Sister 29    CABG x 3  . Cirrhosis Brother   . Alcohol abuse Brother   . Colitis Daughter   . Colon polyps Daughter   . Hypertension Son   . Kidney disease Son     transplant due to kidney problems after [facility]  . Arthritis Other   . Coronary artery disease Other   . Hyperlipidemia Other   . Hypertension Other   . Stroke Sister     died 66  . Arthritis Sister   . Hypertension Sister   . Thyroid disease Daughter   . Thyroid disease Daughter   . Thyroid disease Daughter   . Cirrhosis Brother 52    ETOH abuse    SOCIAL HISTORY:  reports that she quit smoking about 9 months ago. Her smoking use included E-cigarettes. She quit after 32 years of use. She has never used smokeless tobacco. She reports that she does not drink alcohol or use illicit drugs.  PERFORMANCE STATUS: The patient's performance status is 1 - Symptomatic but completely ambulatory  PHYSICAL EXAM: Most Recent Vital Signs: Blood pressure 119/76, pulse 97, temperature 97.6 F (36.4 C), temperature source Oral, resp. rate 16, height 5' 6"  (1.676 m), weight 123 lb (55.792 kg). BP 119/76 mmHg   Pulse 97  Temp(Src) 97.6 F (36.4 C) (Oral)  Resp 16  Ht 5' 6"  (1.676 m)  Wt 123 lb (55.792 kg)  BMI 19.86 kg/m2  General Appearance:    Alert, cooperative, no distress, appears stated age  Head:    Normocephalic, without obvious abnormality, atraumatic  Eyes:    PERRL, conjunctiva/corneas clear, EOM's intact, fundi    benign, both eyes        Throat:   Lips, mucosa, and tongue normal; teeth and gums normal  Neck:   Supple, symmetrical, trachea midline,  no adenopathy;    thyroid:  no enlargement/tenderness/nodules; no carotid   bruit or JVD  Back:     Symmetric, no curvature, ROM normal, no CVA tenderness  Lungs:     Clear to auscultation bilaterally, respirations unlabored  Chest Wall:    No tenderness or deformity   Heart:    Regular rate and rhythm, S1 and S2 normal, no murmur, rub   or gallop     Abdomen:     Soft, some slight tenderness in her abdomen on palpation, bowel sounds active all four quadrants,    no masses, no organomegaly        Extremities:   Extremities normal, atraumatic, no cyanosis or edema  Pulses:   2+ and symmetric all extremities  Skin:   Skin color, texture, turgor normal, no rashes or lesions  Lymph nodes:   Cervical, supraclavicular, and axillary nodes normal  Neurologic:   CNII-XII intact, normal strength, sensation and reflexes    throughout   LABORATORY DATA:  Results for orders placed or performed in visit on 09/24/15 (from the past 48 hour(s))  CBC with Differential [facility])     Status: Abnormal   Collection Time: 09/24/15 11:13 AM  Result Value Ref Range   WBC 9.2 3.9 - 10.0 10e3/uL   RBC 4.54 3.70 - 5.32 10e6/uL   HGB 11.3 (L) 11.6 - 15.9 g/dL   HCT 35.3 34.8 - 46.6 %   MCV 78 (L) 81 - 101 fL   MCH 24.9 (L) 26.0 - 34.0 pg   MCHC 32.0 32.0 - 36.0 g/dL   RDW 17.7 (H) 11.1 - 15.7 %   Platelets 452 (H) 145 - 400 10e3/uL   NEUT# 6.3 1.5 - 6.5 10e3/uL   LYMPH# 1.8 0.9 - 3.3 10e3/uL   MONO# 0.7 0.1 - 0.9 10e3/uL   Eosinophils  Absolute 0.4 0.0 - 0.5 10e3/uL   BASO# 0.0 0.0 - 0.2 10e3/uL   NEUT% 68.3 39.6 - 80.0 %   LYMPH% 19.7 14.0 - 48.0 %   MONO% 7.7 0.0 - 13.0 %   EOS% 4.1 0.0 - 7.0 %   BASO% 0.2 0.0 - 2.0 %  [facility] - Smear     Status: None   Collection Time: 09/24/15 11:13 AM  Result Value Ref Range   Smear Result Smear Available   Ferritin     Status: None   Collection Time: 09/24/15 11:13 AM  Result Value Ref Range   Ferritin 132 9 - 269 ng/ml  Iron and TIBC     Status: Abnormal   Collection Time: 09/24/15 11:13 AM  Result Value Ref Range   Iron 24 (L) 41 - 142 ug/dL   TIBC 236 236 - 444 ug/dL   UIBC 213 120 - 384 ug/dL   %SAT 10 (L) 21 - 57 %  Comprehensive metabolic panel     Status: Abnormal   Collection Time: 09/24/15 11:13 AM  Result Value Ref Range   Sodium 142 136 - 145 mEq/L   Potassium 4.1 3.5 - 5.1 mEq/L   Chloride 108 98 - 109 mEq/L   CO2 21 (L) 22 - 29 mEq/L   Glucose 104 70 - 140 mg/dl    Comment: Glucose reference range is for nonfasting patients. Fasting glucose reference range is 70- 100.   BUN 17.8 7.0 - 26.0 mg/dL   Creatinine 0.8 0.6 - 1.1 mg/dL   Total Bilirubin 0.56 0.20 - 1.20 mg/dL   Alkaline Phosphatase 91 40 - 150  U/L   AST 14 5 - 34 U/L   ALT <9 0 - 55 U/L   Total Protein 7.6 6.4 - 8.3 g/dL   Albumin 3.1 (L) 3.5 - 5.0 g/dL   Calcium 10.0 8.4 - 10.4 mg/dL   Anion Gap 13 (H) 3 - 11 mEq/L   EGFR 89 (L) >90 ml/min/1.73 m2    Comment: eGFR is calculated using the CKD-EPI Creatinine Equation (2009)      RADIOGRAPHY: No results found.     PATHOLOGY: None  ASSESSMENT/PLAN: Ms. Watling is a very pleasant 74 yo African American female with history of iron deficiency anemia. Her iron saturation today is 10% with a ferritin of 132. She is symptomatic with dizziness, weakness, fatigue, SOB with or without exertion, chills at night and palpitations.   We will plan to give her 2 doses of Feraheme the first this week at her convenience.  She has also lost 20 lbs  over the last 3 months without trying She has CT scans of the chest, abdomen and pelvis ordered but is waiting on insurance approval. We will see what these results show and then schedule her follow-up.    All questions were answered. Both she and her daughters know to contact us with any problems, questions or concerns. We can certainly see her much sooner if necessary.  She was discussed with and also seen by Dr. Marin Olp and he is in agreement with the aforementioned.   [facility]    Addendum:  I saw and examined the patient was Judson Roch. Ms. Schelling is very nice.  I will set her blood smear. I do not see any thing obvious for malignancy. She had some microcytic red blood cells. I saw no blasts. She had no immature myeloid or lymphoid cells.  She just looks like she has an underlying malignancy. I suppose that room for arthritis concerning cause her to have the issues that she has.  She is iron deficient. Her ferritin is only 132 and iron saturation is down to 10%. I think that given her underlying inflammatory issues, a ferritin of 130 to is actually quite low.  She is due for CT scans to be done. It will be very interested see what the CT scans show.. I don't see any hypercalcemia. She has no hyponatremia. Her total protein is okay. Her albumin is on the low side.  She comes in with her 2 daughters. There are all very nice. We had excellent fellowship. I gave her a prayer blanket.  We will be in touch with her once we see the results from the CT scan. Hopefully, the iron will make her feel better.  We spent about 45-50 minutes with them today.  Lum Keas

## 2015-09-26 LAB — ERYTHROPOIETIN: Erythropoietin: 21.4 m[IU]/mL — ABNORMAL HIGH (ref 2.6–18.5)

## 2015-09-26 LAB — RETICULOCYTES (CHCC)
ABS Retic: 54.7 10*3/uL (ref 19.0–186.0)
RBC.: 4.56 MIL/uL (ref 3.87–5.11)
Retic Ct Pct: 1.2 % (ref 0.4–2.3)

## 2015-09-30 ENCOUNTER — Other Ambulatory Visit: Payer: Self-pay | Admitting: Family

## 2015-09-30 ENCOUNTER — Telehealth: Payer: Self-pay | Admitting: Family

## 2015-09-30 ENCOUNTER — Ambulatory Visit (HOSPITAL_BASED_OUTPATIENT_CLINIC_OR_DEPARTMENT_OTHER): Payer: Medicare Other

## 2015-09-30 VITALS — BP 140/63 | HR 69 | Temp 97.8°F | Resp 18

## 2015-09-30 DIAGNOSIS — D509 Iron deficiency anemia, unspecified: Secondary | ICD-10-CM

## 2015-09-30 MED ORDER — SODIUM CHLORIDE 0.9 % IV SOLN: 510.0000 mg | Freq: Once | INTRAVENOUS | Status: DC

## 2015-09-30 MED ORDER — SODIUM CHLORIDE 0.9 % IV SOLN
Freq: Once | INTRAVENOUS | Status: AC
  Administered 2015-09-30: 08:00:00 via INTRAVENOUS

## 2015-09-30 MED ORDER — SODIUM CHLORIDE 0.9 % IV SOLN
510.0000 mg | Freq: Once | INTRAVENOUS | Status: AC
  Administered 2015-09-30: 510 mg via INTRAVENOUS
  Filled 2015-09-30: qty 17

## 2015-09-30 NOTE — Telephone Encounter (Signed)
Jennifer please see below 

## 2015-09-30 NOTE — Patient Instructions (Signed)

## 2015-09-30 NOTE — Telephone Encounter (Signed)
Caller name: Florence Canner from [facility] Appeal Department  Call back number: [phone]   Reason for call:  Advise the appeal for CT Scan has been approved.

## 2015-10-01 ENCOUNTER — Encounter (HOSPITAL_BASED_OUTPATIENT_CLINIC_OR_DEPARTMENT_OTHER): Payer: Self-pay

## 2015-10-01 ENCOUNTER — Telehealth: Payer: Self-pay | Admitting: *Deleted

## 2015-10-01 ENCOUNTER — Ambulatory Visit (HOSPITAL_BASED_OUTPATIENT_CLINIC_OR_DEPARTMENT_OTHER)
Admission: RE | Admit: 2015-10-01 | Discharge: 2015-10-01 | Disposition: A | Discharge status: Home / Self Care | Payer: Medicare Other | Source: Ambulatory Visit | Attending: Family | Admitting: Family

## 2015-10-01 DIAGNOSIS — R110 Nausea: Secondary | ICD-10-CM | POA: Insufficient documentation

## 2015-10-01 DIAGNOSIS — Z8542 Personal history of malignant neoplasm of other parts of uterus: Secondary | ICD-10-CM | POA: Diagnosis not present

## 2015-10-01 DIAGNOSIS — Z90710 Acquired absence of both cervix and uterus: Secondary | ICD-10-CM | POA: Insufficient documentation

## 2015-10-01 DIAGNOSIS — R634 Abnormal weight loss: Secondary | ICD-10-CM | POA: Insufficient documentation

## 2015-10-01 DIAGNOSIS — R5383 Other fatigue: Secondary | ICD-10-CM | POA: Diagnosis not present

## 2015-10-01 HISTORY — DX: Malignant (primary) neoplasm, unspecified: C80.1

## 2015-10-01 MED ORDER — IOHEXOL 300 MG/ML  SOLN
90.0000 mL | Freq: Once | INTRAMUSCULAR | Status: AC | PRN
  Administered 2015-10-01: 90 mL via INTRAVENOUS

## 2015-10-01 NOTE — Telephone Encounter (Signed)
-----   Message from Debbrah Alar, NP sent at 10/01/2015  3:21 PM EST ----- Ct abd pelvis is normal. Good news.  How is her appetite and weight since I saw her last?

## 2015-10-01 NOTE — Telephone Encounter (Signed)
Lm on vm awaiting return call

## 2015-10-01 NOTE — Telephone Encounter (Signed)
Notified pt. She thinks she is a little better. Still has poor appetite. Drinking ensure 1 to 2 cans a day. May eat 2 very small meals a day. Still fatigued and continues to have shortness of breath. Reports that weight at home is now 114. Was 123 in our office last week. Please advise.

## 2015-10-02 MED ORDER — MEGESTROL ACETATE 400 MG/10ML PO SUSP: 400.0000 mg | Freq: Every day | ORAL | Status: DC

## 2015-10-02 NOTE — Telephone Encounter (Signed)
Try megace for appetite. Lets see her back in the office next week please for re-evaluation.

## 2015-10-02 NOTE — Telephone Encounter (Signed)
Notified pt and she voices understanding. Appt. Scheduled for Monday at 3pm. Pt states she doesn't think her scale is accurate, now weighs her at 117. Advised her we will compare with our weight at upcoming appt on Monday.

## 2015-10-07 ENCOUNTER — Emergency Department (HOSPITAL_BASED_OUTPATIENT_CLINIC_OR_DEPARTMENT_OTHER)
Admission: EM | Admit: 2015-10-07 | Discharge: 2015-10-07 | Disposition: A | Discharge status: Home / Self Care | Payer: Medicare Other | Attending: Emergency Medicine | Admitting: Emergency Medicine

## 2015-10-07 ENCOUNTER — Encounter (HOSPITAL_BASED_OUTPATIENT_CLINIC_OR_DEPARTMENT_OTHER): Payer: Self-pay | Admitting: Emergency Medicine

## 2015-10-07 ENCOUNTER — Emergency Department (HOSPITAL_BASED_OUTPATIENT_CLINIC_OR_DEPARTMENT_OTHER): Payer: Medicare Other

## 2015-10-07 ENCOUNTER — Ambulatory Visit (INDEPENDENT_AMBULATORY_CARE_PROVIDER_SITE_OTHER): Payer: Medicare Other | Admitting: Family

## 2015-10-07 VITALS — BP 131/67 | HR 84 | Ht 66.5 in

## 2015-10-07 DIAGNOSIS — M069 Rheumatoid arthritis, unspecified: Secondary | ICD-10-CM | POA: Diagnosis not present

## 2015-10-07 DIAGNOSIS — R634 Abnormal weight loss: Secondary | ICD-10-CM | POA: Diagnosis not present

## 2015-10-07 DIAGNOSIS — E785 Hyperlipidemia, unspecified: Secondary | ICD-10-CM | POA: Diagnosis not present

## 2015-10-07 DIAGNOSIS — Z9849 Cataract extraction status, unspecified eye: Secondary | ICD-10-CM | POA: Insufficient documentation

## 2015-10-07 DIAGNOSIS — Z8673 Personal history of transient ischemic attack (TIA), and cerebral infarction without residual deficits: Secondary | ICD-10-CM | POA: Insufficient documentation

## 2015-10-07 DIAGNOSIS — Z87891 Personal history of nicotine dependence: Secondary | ICD-10-CM | POA: Diagnosis not present

## 2015-10-07 DIAGNOSIS — R531 Weakness: Secondary | ICD-10-CM

## 2015-10-07 DIAGNOSIS — R918 Other nonspecific abnormal finding of lung field: Secondary | ICD-10-CM | POA: Diagnosis not present

## 2015-10-07 DIAGNOSIS — D509 Iron deficiency anemia, unspecified: Secondary | ICD-10-CM | POA: Insufficient documentation

## 2015-10-07 DIAGNOSIS — E079 Disorder of thyroid, unspecified: Secondary | ICD-10-CM | POA: Diagnosis not present

## 2015-10-07 DIAGNOSIS — Z8544 Personal history of malignant neoplasm of other female genital organs: Secondary | ICD-10-CM | POA: Diagnosis not present

## 2015-10-07 DIAGNOSIS — R112 Nausea with vomiting, unspecified: Secondary | ICD-10-CM | POA: Insufficient documentation

## 2015-10-07 DIAGNOSIS — M810 Age-related osteoporosis without current pathological fracture: Secondary | ICD-10-CM | POA: Insufficient documentation

## 2015-10-07 DIAGNOSIS — R569 Unspecified convulsions: Secondary | ICD-10-CM | POA: Diagnosis not present

## 2015-10-07 DIAGNOSIS — I10 Essential (primary) hypertension: Secondary | ICD-10-CM | POA: Insufficient documentation

## 2015-10-07 DIAGNOSIS — Z8659 Personal history of other mental and behavioral disorders: Secondary | ICD-10-CM | POA: Insufficient documentation

## 2015-10-07 LAB — URINE MICROSCOPIC-ADD ON: RBC / HPF: NONE SEEN RBC/hpf (ref 0–5)

## 2015-10-07 LAB — BASIC METABOLIC PANEL
Anion gap: 10 (ref 5–15)
BUN: 15 mg/dL (ref 6–20)
CO2: 21 mmol/L — ABNORMAL LOW (ref 22–32)
Calcium: 9.1 mg/dL (ref 8.9–10.3)
Chloride: 107 mmol/L (ref 101–111)
Creatinine, Ser: 0.74 mg/dL (ref 0.44–1.00)
GFR calc Af Amer: >60 mL/min (ref >60)
GFR calc non Af Amer: >60 mL/min (ref >60)
Glucose, Bld: 98 mg/dL (ref 65–99)
Potassium: 4 mmol/L (ref 3.5–5.1)
Sodium: 138 mmol/L (ref 135–145)

## 2015-10-07 LAB — CBG MONITORING, ED: Glucose-Capillary: 102 mg/dL — ABNORMAL HIGH (ref 65–99)

## 2015-10-07 LAB — URINALYSIS, ROUTINE W REFLEX MICROSCOPIC
Glucose, UA: NEGATIVE mg/dL
Hgb urine dipstick: NEGATIVE
Ketones, ur: 15 mg/dL — AB
Nitrite: NEGATIVE
Protein, ur: 100 mg/dL — AB
Specific Gravity, Urine: 1.022 (ref 1.005–1.030)
pH: 6.5 (ref 5.0–8.0)

## 2015-10-07 LAB — CBC
HCT: 35.2 % — ABNORMAL LOW (ref 36.0–46.0)
Hemoglobin: 11.2 g/dL — ABNORMAL LOW (ref 12.0–15.0)
MCH: 24.7 pg — ABNORMAL LOW (ref 26.0–34.0)
MCHC: 31.8 g/dL (ref 30.0–36.0)
MCV: 77.5 fL — ABNORMAL LOW (ref 78.0–100.0)
Platelets: 406 10*3/uL — ABNORMAL HIGH (ref 150–400)
RBC: 4.54 MIL/uL (ref 3.87–5.11)
RDW: 18.8 % — ABNORMAL HIGH (ref 11.5–15.5)
WBC: 9.8 10*3/uL (ref 4.0–10.5)

## 2015-10-07 MED ORDER — ONDANSETRON 4 MG PO TBDP: 4.0000 mg | ORAL_TABLET | Freq: Three times a day (TID) | ORAL | Status: DC | PRN

## 2015-10-07 NOTE — ED Provider Notes (Signed)
CSN: [id number]     Arrival date & time 10/07/15  1500 History  By signing my name below, I, Meriel Pica, attest that this documentation has been prepared under the direction and in the presence of Fredia Sorrow, MD. Electronically Signed: Meriel Pica, ED Scribe. 10/07/2015. 4:06 PM.   Chief Complaint  Patient presents with  . Weakness   The history is provided by the patient, medical records and a relative. No language interpreter was used.   HPI Comments: Nancy [name] is a 74 y.o. female, with a PMHx of anemia, HLD, HTN, RA, osteoporosis, and TIA, who presents to the Emergency Department from PCP office for evaluation of sudden onset hyperventilating and generalized weakness. Per note from NP at [facility], the pt presents to the ED from on site [facility] where she was found by staff there to be hyperventilating and extremely weak while in the waiting room with a near-syncopal appearance. Pt reports she had an appointment with her PCP today for evaluation of weight loss of 20lbs over the past several months. Pt has not been eating as normal over the past several months and notes when she does eat she vomits the food up and feels nauseous afterwards. She also notes upon eating pain that appears to radiate from her epigastrum to her rectum and that is not relieved by bowel movements. The pt had a CT abdomen pelvis last week that was unremarkable and a chest Xray several weeks ago that was also normal. She has an appointment with GI in 4 days and received a non-concerning colonoscopy 3 years ago. Per daughter, the pt had a 'seizure-like' episode while at her house 4 days ago that is described as sudden osnet hyperventilating, diaphoresis, and weakness. Daughter states this recent episode is similar to the episode the pt experienced today in her PCP office. She had an unremarkable head CT ~ 3 months ago. Pt also notes receiving infusions for RA from her PCP that she associates feeling  generally weak with.   PCP: Dr. Inda Castle   Past Medical History  Diagnosis Date  . Allergy   . Anemia     iron deficiency  . Depression   . Hyperlipidemia   . Hypertension   . Urinary incontinence   . Arthritis     rheumatoid  . Cataract   . Thyroid disease   . TIA (transient ischemic attack) 2016  . Osteoporosis 01/01/2015  . 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 Abdominal hysterectomy  1976  . Tumor removal  last was 1976    left ankle x3  . Cataract extraction Right 2016   Family History  Problem Relation Age of Onset  . Hypertension Father   . Heart disease Sister 54    CABG x 3  . Cirrhosis Brother   . Alcohol abuse Brother   . Colitis Daughter   . Colon polyps Daughter   . Hypertension Son   . Kidney disease Son     transplant due to kidney problems after [facility]  . Arthritis Other   . Coronary artery disease Other   . Hyperlipidemia Other   . Hypertension Other   . Stroke Sister     died 39  . Arthritis Sister   . Hypertension Sister   . Thyroid disease Daughter   . Thyroid disease Daughter   . Thyroid disease Daughter   . Cirrhosis Brother 65    ETOH  abuse   Social History  Substance Use Topics  . Smoking status: Former Smoker -- 32 years    Types: E-cigarettes    Quit date: 12/13/2014  . Smokeless tobacco: Never Used  . Alcohol Use: No   OB History    No data available     Review of Systems  Constitutional: Positive for chills. Negative for fever.  HENT: Positive for congestion ( chest). Negative for rhinorrhea and sore throat.   Eyes: Negative for visual disturbance.  Respiratory: Positive for cough and shortness of breath.   Cardiovascular: Negative for chest pain and leg swelling.  Gastrointestinal: Positive for nausea, vomiting and abdominal pain. Negative for diarrhea.  Genitourinary: Negative for dysuria and hematuria.  Musculoskeletal: Positive for back pain and arthralgias (  RA). Negative for neck pain.  Skin: Negative for rash.  Neurological: Positive for weakness and headaches ( mild ).  Hematological: Does not bruise/bleed easily.  Psychiatric/Behavioral: Negative for confusion.   Allergies  Amlodipine  Home Medications   Prior to Admission medications   Medication Sig Start Date End Date Taking? Authorizing Provider  aspirin 325 MG tablet Take 1 tablet (325 mg total) by mouth daily. 12/18/14   Debbe Odea, MD  atorvastatin (LIPITOR) 20 MG tablet TAKE 1 TABLET BY MOUTH EVERY DAY AT [facility] 07/20/15   Garvin Fila, MD  bisacodyl (DULCOLAX) 10 MG suppository Place 1 suppository (10 mg total) rectally daily as needed for moderate constipation (if Miralax ineffective). 12/18/14   Debbe Odea, MD  Calcium Carbonate-Vitamin D (CALTRATE 600+D) 600-400 MG-UNIT per tablet Take 1 tablet by mouth 2 (two) times daily. 01/01/15   Debbrah Alar, NP  celecoxib (CELEBREX) 200 MG capsule TAKE ONE CAPSULE BY MOUTH EVERY DAY AS NEEDED FOR JOINT PAIN 09/10/15   Debbrah Alar, NP  ferrous sulfate 325 (65 FE) MG tablet Take 325 mg by mouth 2 (two) times daily with a meal.    Historical Provider, MD  folic acid (FOLVITE) 1 MG tablet Take 1 mg by mouth daily.     Historical Provider, MD  leflunomide (ARAVA) 10 MG tablet Take 10 mg by mouth daily. 08/15/15   Historical Provider, MD  levothyroxine (SYNTHROID, LEVOTHROID) 75 MCG tablet Take 1 tablet (75 mcg total) by mouth daily. 09/11/15   Debbrah Alar, NP  loratadine (CLARITIN) 10 MG tablet Take 10 mg by mouth daily as needed. For allergies     Historical Provider, MD  megestrol (MEGACE) 400 MG/10ML suspension Take 10 mLs (400 mg total) by mouth daily. 10/02/15   Debbrah Alar, NP  methotrexate (RHEUMATREX) 2.5 MG tablet Take 15 mg by mouth once a week. Caution:Chemotherapy. Protect from light. Take on Fridays    Historical Provider, MD  metoprolol succinate (TOPROL-XL) 50 MG 24 hr tablet TAKE 1 TABLET BY MOUTH EVERY DAY  WITH OR IMMEDIATELY FOLLOWING A MEAL 08/12/15   Debbrah Alar, NP  nystatin (MYCOSTATIN/NYSTOP) 100000 UNIT/GM POWD Apply between breasts twice daily 09/10/15   Debbrah Alar, NP  omeprazole (PRILOSEC) 40 MG capsule TAKE ONE CAPSULE BY MOUTH EVERY DAY 09/02/15   Debbrah Alar, NP  ondansetron (ZOFRAN ODT) 4 MG disintegrating tablet Take 1 tablet (4 mg total) by mouth every 8 (eight) hours as needed for nausea or vomiting. 10/07/15   Fredia Sorrow, MD  polyethylene glycol [facility]) packet Take 17 g by mouth daily. 12/18/14   Debbe Odea, MD   Triage Vitals: BP 129/80 mmHg  Pulse 73  Resp 22  SpO2 100% Physical Exam  Constitutional: She  is oriented to person, place, and time. She appears well-developed and well-nourished. No distress.  HENT:  Head: Normocephalic and atraumatic.  Mouth/Throat: Oropharynx is clear and moist.  Mucous membranes moist.   Eyes: Conjunctivae and EOM are normal. Pupils are equal, round, and reactive to light.  Neck: Normal range of motion.  Cardiovascular: Normal rate, regular rhythm and normal heart sounds.   No murmur heard. Pulmonary/Chest: Effort normal and breath sounds normal. No respiratory distress. She has no wheezes.  No swelling to bilateral ankles.   Abdominal: Soft. Bowel sounds are normal. She exhibits no distension. There is no tenderness.  Musculoskeletal: Normal range of motion. She exhibits no edema.  Bilateral hands; nodular changes to joints due to arthritis.   Neurological: She is alert and oriented to person, place, and time.  Skin: Skin is warm and dry.  Psychiatric: She has a normal mood and affect. Judgment normal.  Nursing note and vitals reviewed.   ED Course  Procedures  DIAGNOSTIC STUDIES: Oxygen Saturation is 100% on RA, normal by my interpretation.    COORDINATION OF CARE: 4:06 PM Discussed treatment plan with pt and daughter. Discussed unremarkable lab results obtained thus far. Pt and daughter acknowledge and  agree to plan.   Labs Review Labs Reviewed  BASIC METABOLIC PANEL - Abnormal; Notable for the following:    CO2 21 (*)    All other components within normal limits  CBC - Abnormal; Notable for the following:    Hemoglobin 11.2 (*)    HCT 35.2 (*)    MCV 77.5 (*)    MCH 24.7 (*)    RDW 18.8 (*)    Platelets 406 (*)    All other components within normal limits  URINALYSIS, ROUTINE W REFLEX MICROSCOPIC (NOT AT [facility]) - Abnormal; Notable for the following:    Color, Urine AMBER (*)    APPearance CLOUDY (*)    Bilirubin Urine SMALL (*)    Ketones, ur 15 (*)    Protein, ur 100 (*)    Leukocytes, UA MODERATE (*)    All other components within normal limits  URINE MICROSCOPIC-ADD ON - Abnormal; Notable for the following:    Squamous Epithelial / LPF 6-30 (*)    Bacteria, UA RARE (*)    Casts HYALINE CASTS (*)    All other components within normal limits  CBG MONITORING, ED - Abnormal; Notable for the following:    Glucose-Capillary 102 (*)    All other components within normal limits  URINE CULTURE   Results for orders placed or performed during the hospital encounter of [id number]  Basic metabolic panel  Result Value Ref Range   Sodium 138 135 - 145 mmol/L   Potassium 4.0 3.5 - 5.1 mmol/L   Chloride 107 101 - 111 mmol/L   CO2 21 (L) 22 - 32 mmol/L   Glucose, Bld 98 65 - 99 mg/dL   BUN 15 6 - 20 mg/dL   Creatinine, Ser 0.74 0.44 - 1.00 mg/dL   Calcium 9.1 8.9 - 10.3 mg/dL   GFR calc non Af Amer >60 >60 mL/min   GFR calc Af Amer >60 >60 mL/min   Anion gap 10 5 - 15  CBC  Result Value Ref Range   WBC 9.8 4.0 - 10.5 K/uL   RBC 4.54 3.87 - 5.11 MIL/uL   Hemoglobin 11.2 (L) 12.0 - 15.0 g/dL   HCT 35.2 (L) 36.0 - 46.0 %   MCV 77.5 (L) 78.0 - 100.0 fL  MCH 24.7 (L) 26.0 - 34.0 pg   MCHC 31.8 30.0 - 36.0 g/dL   RDW 18.8 (H) 11.5 - 15.5 %   Platelets 406 (H) 150 - 400 K/uL  Urinalysis, Routine w reflex microscopic (not at [facility])  Result Value Ref Range   Color, Urine AMBER (A)  YELLOW   APPearance CLOUDY (A) CLEAR   Specific Gravity, Urine 1.022 1.005 - 1.030   pH 6.5 5.0 - 8.0   Glucose, UA NEGATIVE NEGATIVE mg/dL   Hgb urine dipstick NEGATIVE NEGATIVE   Bilirubin Urine SMALL (A) NEGATIVE   Ketones, ur 15 (A) NEGATIVE mg/dL   Protein, ur 100 (A) NEGATIVE mg/dL   Nitrite NEGATIVE NEGATIVE   Leukocytes, UA MODERATE (A) NEGATIVE  Urine microscopic-add on  Result Value Ref Range   Squamous Epithelial / LPF 6-30 (A) NONE SEEN   WBC, UA 6-30 0 - 5 WBC/hpf   RBC / HPF NONE SEEN 0 - 5 RBC/hpf   Bacteria, UA RARE (A) NONE SEEN   Casts HYALINE CASTS (A) NEGATIVE  CBG monitoring, ED  Result Value Ref Range   Glucose-Capillary 102 (H) 65 - 99 mg/dL         Imaging Review Ct Head Wo Contrast  10/07/2015  CLINICAL DATA:  Seizures with uncontrolled trembling EXAM: CT HEAD WITHOUT CONTRAST TECHNIQUE: Contiguous axial images were obtained from the base of the skull through the vertex without intravenous contrast. COMPARISON:  Head CT December 16, 2014 and brain MRI December 17, 2014 FINDINGS: There is age related volume loss. There is no intracranial mass, hemorrhage, extra-axial fluid collection, or midline shift. There is rather minimal small vessel disease in the centra semiovale bilaterally. Elsewhere gray-white compartments appear normal. No acute infarct evident. The bony calvarium appears intact. The mastoid air cells are clear. IMPRESSION: Age related volume loss with rather minimal periventricular small vessel disease. No intracranial mass, hemorrhage, or extra-axial fluid collection. No acute appearing infarct. Electronically Signed   By: Lowella Grip III M.D.   On: 10/07/2015 17:07   Ct Chest Wo Contrast  10/07/2015  CLINICAL DATA:  Weakness and trembling.  Involuntary weight loss. EXAM: CT CHEST WITHOUT CONTRAST TECHNIQUE: Multidetector CT imaging of the chest was performed following the standard protocol without IV contrast. COMPARISON:  CT abdomen  10/01/2015 and chest x-ray 08/22/2015 FINDINGS: Lungs are well inflated without focal consolidation or effusion. There is mild peripheral increased interstitial markings bilaterally most prominent over the lung bases with minimal focal bronchiectatic change over the posterior right lower lobe. Minimal linear scarring over the right middle lobe and lateral left upper lobe. Airways are within normal. Heart is normal in size. Very minimal calcified plaque over the left anterior descending and lateral circumflex coronary arteries. Minimal calcified plaque over the thoracic aorta. No significant mediastinal or hilar adenopathy. Few small symmetric bilateral axillary lymph nodes are present. Remaining mediastinal structures are within normal. Images through the upper abdomen are within normal. Remaining bones and soft tissues are within normal. IMPRESSION: No acute cardiopulmonary disease. No evidence of focal mass or adenopathy. Mild interstitial disease with minimal right basilar bronchiectatic change as findings suggest mild fibrosis. Minimal atherosclerotic coronary artery disease. Electronically Signed   By: Marin Olp M.D.   On: 10/07/2015 17:15   I have personally reviewed and evaluated these images and lab results as part of my medical decision-making.   EKG Interpretation   Date/Time:  Monday October 07 2015 15:08:20 EST Ventricular Rate:  73 PR Interval:  168 QRS  Duration: 68 QT Interval:  396 QTC Calculation: 436 R Axis:   86 Text Interpretation:  Normal sinus rhythm Normal ECG Confirmed by  Rasheeda Mulvehill  MD, Griffin Dewilde [id number]) on 10/07/2015 3:16:12 PM      MDM   Final diagnoses:  Weakness  Weight loss, abnormal  Nausea and vomiting, vomiting of unspecified type    Patient with about a 2 month history of similar symptoms as to what occurred today. Workup in process without any sniffing findings to date. Patient had CT scan abdomen and pelvis recently done any acute findings. CT chest  today and CT scan of the head without any abnormalities. Labs without any significant abnormalities. Patient able to ambulate here. Patient will need close follow-up with her primary care doctor and keep her GI consult. Urinalysis here today with questionable early urinary tract infection. Culture sent for confirmation. No antibiotics at this point in time. Patient we treated with antinausea medicine Zofran.  I personally performed the services described in this documentation, which was scribed in my presence. The recorded information has been reviewed and is accurate.      Fredia Sorrow, MD 10/07/15 [phone]

## 2015-10-07 NOTE — Progress Notes (Signed)
I was called to the waiting room by front desk staff.  Pt was found seated in chair, extremely weak, labored breathing, thready pulse.  Near loss of consciousness.  Rapid response team was called from the ED and patient was brought down to the ER for further evaluation. I informed the patient's daughter Nancy [name] that we sent the patient to the ED for further evaluation and she will head over to th ED now.

## 2015-10-07 NOTE — ED Notes (Signed)
Was called up to the [place] for a rapid response where this patient was found to be sitting up and hyperventalating. The patient was having tremors all over and found to have a weakness throughout. Patient states that she has periods of severe weakness recently and she found to have low iron. The patient reports that she had a CT last week to find out what was going on. The NP upstairs states that the patient was unconscious x 1 minute. VSS in the MD office and patient brought down to the ER - the patient c/o generalized weakness, bilateral knee pain and right arm pain. The patient also calmer while in the bed here in the ER. The patient reports a large amount of weight loss recently as well.

## 2015-10-07 NOTE — Discharge Instructions (Signed)
Workup without any specific findings. Questionable urinary tract infection and send it off for culture they will call if it's abnormal. Take antinausea medicine as directed. Make an appointment to follow back up with your primary care doctor. Keep your appointment with GI medicine. Return for any new or worse symptoms.

## 2015-10-08 ENCOUNTER — Ambulatory Visit (HOSPITAL_BASED_OUTPATIENT_CLINIC_OR_DEPARTMENT_OTHER): Payer: Medicare Other

## 2015-10-08 VITALS — BP 127/60 | HR 76 | Temp 98.0°F | Resp 16

## 2015-10-08 DIAGNOSIS — D509 Iron deficiency anemia, unspecified: Secondary | ICD-10-CM

## 2015-10-08 DIAGNOSIS — M0579 Rheumatoid arthritis with rheumatoid factor of multiple sites without organ or systems involvement: Secondary | ICD-10-CM | POA: Diagnosis not present

## 2015-10-08 DIAGNOSIS — Z79899 Other long term (current) drug therapy: Secondary | ICD-10-CM | POA: Diagnosis not present

## 2015-10-08 MED ORDER — SODIUM CHLORIDE 0.9 % IV SOLN
Freq: Once | INTRAVENOUS | Status: AC
  Administered 2015-10-08: 09:00:00 via INTRAVENOUS

## 2015-10-08 MED ORDER — SODIUM CHLORIDE 0.9 % IV SOLN
510.0000 mg | Freq: Once | INTRAVENOUS | Status: AC
  Administered 2015-10-08: 510 mg via INTRAVENOUS
  Filled 2015-10-08: qty 17

## 2015-10-08 NOTE — Patient Instructions (Signed)

## 2015-10-09 ENCOUNTER — Telehealth: Payer: Self-pay | Admitting: Family

## 2015-10-09 DIAGNOSIS — R55 Syncope and collapse: Secondary | ICD-10-CM

## 2015-10-09 LAB — URINE CULTURE

## 2015-10-09 NOTE — Telephone Encounter (Signed)
Spoke with pt to reschedule appt for the same day of 10-15-15 but in her in 30 min slot at 9:30 am instead of 10:30.

## 2015-10-09 NOTE — Telephone Encounter (Signed)
Pt will call back to confirm appt for 10-15-15 9:30 am (pt needs to speak with daughter to make arrangements first)

## 2015-10-10 ENCOUNTER — Encounter: Payer: Self-pay | Admitting: Physician Assistant

## 2015-10-10 ENCOUNTER — Ambulatory Visit (INDEPENDENT_AMBULATORY_CARE_PROVIDER_SITE_OTHER): Payer: Medicare Other | Admitting: Physician Assistant

## 2015-10-10 VITALS — BP 144/82 | HR 72 | Ht 64.0 in | Wt 118.5 lb

## 2015-10-10 DIAGNOSIS — D509 Iron deficiency anemia, unspecified: Secondary | ICD-10-CM | POA: Diagnosis not present

## 2015-10-10 DIAGNOSIS — R112 Nausea with vomiting, unspecified: Secondary | ICD-10-CM | POA: Diagnosis not present

## 2015-10-10 DIAGNOSIS — R1013 Epigastric pain: Secondary | ICD-10-CM | POA: Diagnosis not present

## 2015-10-10 DIAGNOSIS — R634 Abnormal weight loss: Secondary | ICD-10-CM

## 2015-10-10 MED ORDER — ONDANSETRON 4 MG PO TBDP: ORAL_TABLET | ORAL | Status: DC

## 2015-10-10 NOTE — Progress Notes (Signed)
Reviewed and agree with documentation and assessment and plan. K. Veena Blima Jaimes , MD   

## 2015-10-10 NOTE — Progress Notes (Signed)
[name], female   DOB: 08/17/1941, 74 y.o.   MRN: [id number]   Subjective:    [name], female    DOB: [birth year], 74 y.o.   MRN: [id number]  HPI  Nancy [name]  Is a pleasant 74 year old African-American female , known to Dr. Deatra Ina from colonoscopy done in 2014. She is referred today by Nancy [name] for evaluation of nausea and weight loss. [name] Patient had had colonoscopy in May 2014 with finding of one small sessile polyp which was removed and internal hemorrhoids. Path consistent with a tubular adenoma.  Patient has history of TIA/CVA for which she is on aspirin 325 daily, history of B12 deficiency, rheumatoid arthritis, hypertension, and now has a diagnosis of iron deficiency anemia. She has just started ferrous heme infusions. Her last hemoglobin was 11.2 hematocrit of 35.2.   patient had presented recently with a 20 pound weight loss over the past 2-3 months which is been unintentional. She says she has no appetite and has had some queasiness and nausea. Her daughter says that she's been vomiting frequently. She says about every time she eats she gets nauseated and vomits within a few minutes of eating. She has also developed some epigastric discomfort which at times seems to also radiate through and cause pressure into her rectum. This seems to occur immediately after eating. Am times relieved by vomiting. She denies any dysphagia or odynophagia no heartburn or indigestion. No fever or chills. Bowel movements have been somewhat softer or looser or melena or hematochezia noted.  She is on multiple medications and had been taking Celebrex fairly long-term in addition to the 325 aspirin daily. [name] She has just started taking Zofran last week which is helping. She also started Prilosec a couple of weeks ago and between both of these medications symptoms have settled down a bit.  Patient had CT of the chest done which showed no malignancy she does have mild  interstitial changes in CT of the abdomen and pelvis in November 2016 negative.  Review of Systems Pertinent positive and negative review of systems were noted in the above HPI section.  All other review of systems was otherwise negative.  Outpatient Encounter Prescriptions as of 10/10/2015  Medication Sig  . aspirin 325 MG tablet Take 1 tablet (325 mg total) by mouth daily.  Marland Kitchen atorvastatin (LIPITOR) 20 MG tablet TAKE 1 TABLET BY MOUTH EVERY DAY AT 6PM  . bisacodyl (DULCOLAX) 10 MG suppository Place 1 suppository (10 mg total) rectally daily as needed for moderate constipation (if Miralax ineffective).  . Calcium Carbonate-Vitamin D (CALTRATE 600+D) 600-400 MG-UNIT per tablet Take 1 tablet by mouth 2 (two) times daily.  . celecoxib (CELEBREX) 200 MG capsule TAKE ONE CAPSULE BY MOUTH EVERY DAY AS NEEDED FOR JOINT PAIN  . folic acid (FOLVITE) 1 MG tablet Take 1 mg by mouth daily.   Marland Kitchen leflunomide (ARAVA) 10 MG tablet Take 10 mg by mouth daily.  Marland Kitchen levothyroxine (SYNTHROID, LEVOTHROID) 75 MCG tablet Take 1 tablet (75 mcg total) by mouth daily.  Marland Kitchen loratadine (CLARITIN) 10 MG tablet Take 10 mg by mouth daily as needed. For allergies   . megestrol (MEGACE) 400 MG/10ML suspension Take 10 mLs (400 mg total) by mouth daily.  . methotrexate (RHEUMATREX) 2.5 MG tablet Take 15 mg by mouth once a week. Caution:Chemotherapy. Protect from light. Take on Fridays  . metoprolol succinate (TOPROL-XL) 50 MG 24 hr tablet TAKE 1 TABLET BY MOUTH EVERY DAY WITH OR IMMEDIATELY  FOLLOWING A MEAL  . nystatin (MYCOSTATIN/NYSTOP) 100000 UNIT/GM POWD Apply between breasts twice daily  . omeprazole (PRILOSEC) 40 MG capsule TAKE ONE CAPSULE BY MOUTH EVERY DAY  . ondansetron (ZOFRAN ODT) 4 MG disintegrating tablet Take 1 tab, dissolve on the tongue every 6 hours as needed for nausea.  . polyethylene glycol (MIRALAX) packet Take 17 g by mouth daily. (Patient taking differently: Take 17 g by mouth as needed. )  . predniSONE  (DELTASONE) 5 MG tablet Take 10 mg by mouth daily with breakfast.  . [DISCONTINUED] ondansetron (ZOFRAN ODT) 4 MG disintegrating tablet Take 1 tablet (4 mg total) by mouth every 8 (eight) hours as needed for nausea or vomiting.  . [DISCONTINUED] ondansetron (ZOFRAN ODT) 4 MG disintegrating tablet Take 1 tab, dissolve on the tongue every 6 hours as needed for nausea.  . [DISCONTINUED] ferrous sulfate 325 (65 FE) MG tablet Take 325 mg by mouth 2 (two) times daily with a meal.   No facility-administered encounter medications on file as of 10/10/2015.   Allergies  Allergen Reactions  . Amlodipine Other (See Comments)    dizziness  . Iron Nausea And Vomiting    Can take infusions    Patient Active Problem List   Diagnosis Date Noted  . Anemia, iron deficiency 09/30/2015  . Hyperglycemia 06/07/2015  . Syncope 05/15/2015  . Osteoporosis 01/01/2015  . Expressive aphasia 12/16/2014  . TIA (transient ischemic attack) 12/16/2014  . Pain in joint, ankle and foot 09/06/2014  . Constipation 09/06/2014  . Rheumatoid arthritis ([place]) 07/25/2014  . Sciatica 01/31/2014  . Hypothyroidism 01/12/2012  . Back pain 01/11/2012  . Edema 06/15/2011  . ANEMIA, B12 DEFICIENCY 11/19/2010  . ANXIETY 10/14/2010  . 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 VITAMIN D DEFICIENCY 12/17/2009  . Hyperlipidemia 12/17/2009  . Iron deficiency anemia 12/17/2009  . TOBACCO ABUSE 12/17/2009  . Depression 12/17/2009  . Essential hypertension 12/17/2009  . ALLERGIC RHINITIS 12/17/2009  . ARTHRITIS, RHEUMATOID 12/17/2009  . OSTEOPOROSIS 12/17/2009  . URINARY INCONTINENCE 12/17/2009   Social History   Social History  . Marital Status: Divorced    Spouse Name: N/A  . Number of Children: 8  . Years of Education: N/A   Occupational History  .      Retired from [employer]  . Smoking status: Former Smoker -- 32 years    Types: E-cigarettes    Quit date: 12/13/2014  . Smokeless tobacco: Never  Used  . Alcohol Use: No  . Drug Use: No  . Sexual Activity: Not on file   Other Topics Concern  . Not on file   Social History Narrative    [name]'s family history includes Alcohol abuse in her brother; Arthritis in her other and sister; Cirrhosis in her brother; Cirrhosis (age of onset: 57) in her brother; Colitis in her daughter; Colon cancer in her father; Colon polyps in her daughter; Coronary artery disease in her other; Heart disease (age of onset: 73) in her sister; Hyperlipidemia in her other; Hypertension in her father, other, sister, and son; Kidney disease in her son; Parkinson's disease in her mother; Stroke in her sister; Thyroid disease in her daughter; Ulcerative colitis in her mother.      Objective:    Filed Vitals:   10/10/15 0827  BP: 144/82  Pulse: 72    Physical Exam   Well-developed elderly African-American female in no acute distress, pleasant accompanied by her daughter blood pressure 144/82 pulse 72 height  5 foot 4 weight 118, HEENT; nontraumatic normocephalic EOMI PERRLA sclera anicteric, neck supple no JVD, Cardiovascular; regular rate and rhythm with S1-S2 , Pulmonary; clear bilaterally, Abdomen; flat soft she has some mild tenderness in the epigastrium and right upper quadrant and also in the left lower quadrant there is no guarding or rebound no palpable mass or hepatosplenomegaly she has a low midline incisional scar, Rectal ;exam no external lesions noted stool brown and Hemoccult negative , Extremities; no clubbing cyanosis or edema skin warm and dry, she has significant joint changes of rheumatoid arthritis Neuropsych mood and affect appropriate       Assessment & Plan:   #1 74 yo female with 20 pound weight loss over 2-3 months, associated with epigastric pain, nausea, vomiting, and rectal pressure/urgency Negative Ctabd/pelvis  R/O malignancy, R/O  PUD/gastropathy #2 Fe deficiency anemia-currently heme neg- on fereheme infusions #3 B12  deficiency #4 Hx adenomatous polyp 2014 #5 RA #6 hx TIA- on ASA  Plan; Schedule for EGD  Small bowel Bx,and Colonoscopy with Dr Silverio Decamp. Procedures discussed in detail with pt and she is agreeable to proceed. Stop Celebrex  Continue Zofran 4 mg po q 6 hours prn  Continue Prilosec 40 mg po daily   Cherilynn Schomburg S Maira Christon PA-C 10/10/2015   Cc: Nancy Alar, NP

## 2015-10-10 NOTE — Patient Instructions (Signed)
You have been scheduled for an endoscopy and colonoscopy. Please follow the written instructions given to you at your visit today. We have given y ou a free prep for the colonoscopy. If you use inhalers (even only as needed), please bring them with you on the day of your procedure. Your physician has requested that you go to www.startemmi.com and enter the access code given to you at your visit today. This web site gives a general overview about your procedure. However, you should still follow specific instructions given to you by our office regarding your preparation for the procedure.  Stay on Omeprazole 40 mg by mouth every morning.

## 2015-10-15 ENCOUNTER — Telehealth: Payer: Self-pay

## 2015-10-15 ENCOUNTER — Encounter: Payer: Self-pay | Admitting: Family

## 2015-10-15 ENCOUNTER — Ambulatory Visit: Payer: Medicaid Other | Admitting: Family

## 2015-10-15 ENCOUNTER — Ambulatory Visit (INDEPENDENT_AMBULATORY_CARE_PROVIDER_SITE_OTHER): Payer: Medicare Other | Admitting: Family

## 2015-10-15 VITALS — BP 148/76 | HR 77 | Temp 98.4°F | Resp 16 | Ht 66.5 in | Wt 117.0 lb

## 2015-10-15 DIAGNOSIS — E039 Hypothyroidism, unspecified: Secondary | ICD-10-CM

## 2015-10-15 DIAGNOSIS — R55 Syncope and collapse: Secondary | ICD-10-CM | POA: Diagnosis not present

## 2015-10-15 DIAGNOSIS — I10 Essential (primary) hypertension: Secondary | ICD-10-CM | POA: Diagnosis not present

## 2015-10-15 DIAGNOSIS — R634 Abnormal weight loss: Secondary | ICD-10-CM | POA: Insufficient documentation

## 2015-10-15 DIAGNOSIS — D509 Iron deficiency anemia, unspecified: Secondary | ICD-10-CM | POA: Diagnosis not present

## 2015-10-15 LAB — TSH: TSH: 3.21 u[IU]/mL (ref 0.35–4.50)

## 2015-10-15 MED ORDER — CIPROFLOXACIN HCL 250 MG PO TABS: 250.0000 mg | ORAL_TABLET | Freq: Two times a day (BID) | ORAL | Status: DC

## 2015-10-15 NOTE — Progress Notes (Signed)
Subjective:    [name], female    DOB: [birth year], 75 y.o.   MRN: [id number]  HPI   Nancy [name] is a 74 yr old female who presents today for ED follow up.  She presented to our office on 11/28 and experienced near syncopal episode in our waiting area.   Wt Readings from Last 3 Encounters:  10/15/15 117 lb (53.071 kg)  10/10/15 118 lb 8 oz (53.751 kg)  09/24/15 123 lb (55.792 kg)   Daughter also noted during the pt's visit that the patient has had a seizure like episode on 11/24.  According to patient the episode was brief.  The daughTer who witnessed this event is not available today. Of note, the pt did have a neg EEG 06/04/15.  ED work up was notable for microcytic anemia (this is managed by Hematology with IV iron infusions).   and UTI. Culture grew multiple morphotypes, not given abx.  Denies dysuria, but notes when she does go she only goet a little bit.  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  CT head was negative for CVA.    RA- this is currently managed by Dr. Lyda Perone.  Notes joint pain is better- she is on methotrexate and prednisone.    Weight loss-  Patient continues to lose weight.  CT chest in ED neg for malignancy.  CT abd/pelvis neg for malignancy 10/01/15. Has upcoming colo/endo scheduled.   Lab Results  Component Value Date   TSH 5.77* 06/07/2015   Patient started megace about 1 week ago.  Notes slow improvement after eating.  Notes some post prandial nausea.  Notes zofran helps.   Review of Systems See HPI  Past Medical History  Diagnosis Date  . Allergy   . Anemia     iron deficiency  . Depression   . Hyperlipidemia   . Hypertension   . Urinary incontinence   . Rheumatoid arteritis   . Cataract   . Thyroid disease   . TIA (transient ischemic attack) 2016  . Osteoporosis 01/01/2015  . 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 Colon polyps     Social History   Social History  . Marital Status: Divorced   Spouse Name: N/A  . Number of Children: 8  . Years of Education: N/A   Occupational History  .      Retired from [employer]  . Smoking status: Former Smoker -- 32 years    Types: E-cigarettes    Quit date: 12/13/2014  . Smokeless tobacco: Never Used  . Alcohol Use: No  . Drug Use: No  . Sexual Activity: Not on file   Other Topics Concern  . Not on file   Social History Narrative    Past Surgical History  Procedure Laterality Date  . Abdominal hysterectomy  1976  . Tumor removal  last was 1976    left ankle x3  . Cataract extraction Right 2016    Family History  Problem Relation Age of Onset  . Hypertension Father   . Heart disease Sister 43    CABG x 3  . Cirrhosis Brother   . Alcohol abuse Brother   . Colitis Daughter   . Colon polyps Daughter   . Hypertension Son   . Kidney disease Son     transplant due to kidney problems after [facility]  . Arthritis Other   . Coronary artery disease Other   .  Hyperlipidemia Other   . Hypertension Other   . Stroke Sister     died 68  . Arthritis Sister   . Hypertension Sister   . Thyroid disease Daughter     x 3  . Cirrhosis Brother 41    ETOH abuse  . Colon cancer Father   . Ulcerative colitis Mother   . Parkinson's disease Mother     Allergies  Allergen Reactions  . Amlodipine Other (See Comments)    dizziness  . Iron Nausea And Vomiting    Can take infusions     Current Outpatient Prescriptions on File Prior to Visit  Medication Sig Dispense Refill  . aspirin 325 MG tablet Take 1 tablet (325 mg total) by mouth daily.    Marland Kitchen atorvastatin (LIPITOR) 20 MG tablet TAKE 1 TABLET BY MOUTH EVERY DAY AT 6PM 30 tablet 0  . bisacodyl (DULCOLAX) 10 MG suppository Place 1 suppository (10 mg total) rectally daily as needed for moderate constipation (if Miralax ineffective). 12 suppository 0  . Calcium Carbonate-Vitamin D (CALTRATE 600+D) 600-400 MG-UNIT per tablet Take 1 tablet by  mouth 2 (two) times daily.    . celecoxib (CELEBREX) 200 MG capsule TAKE ONE CAPSULE BY MOUTH EVERY DAY AS NEEDED FOR JOINT PAIN 30 capsule 2  . folic acid (FOLVITE) 1 MG tablet Take 1 mg by mouth daily.     Marland Kitchen leflunomide (ARAVA) 10 MG tablet Take 10 mg by mouth daily.  3  . levothyroxine (SYNTHROID, LEVOTHROID) 75 MCG tablet Take 1 tablet (75 mcg total) by mouth daily. 30 tablet 1  . loratadine (CLARITIN) 10 MG tablet Take 10 mg by mouth daily as needed. For allergies     . megestrol (MEGACE) 400 MG/10ML suspension Take 10 mLs (400 mg total) by mouth daily. 480 mL 0  . methotrexate (RHEUMATREX) 2.5 MG tablet Take 15 mg by mouth once a week. Caution:Chemotherapy. Protect from light. Take on Fridays    . metoprolol succinate (TOPROL-XL) 50 MG 24 hr tablet TAKE 1 TABLET BY MOUTH EVERY DAY WITH OR IMMEDIATELY FOLLOWING A MEAL 90 tablet 1  . nystatin (MYCOSTATIN/NYSTOP) 100000 UNIT/GM POWD Apply between breasts twice daily 30 g 0  . omeprazole (PRILOSEC) 40 MG capsule TAKE ONE CAPSULE BY MOUTH EVERY DAY 30 capsule 1  . ondansetron (ZOFRAN ODT) 4 MG disintegrating tablet Take 1 tab, dissolve on the tongue every 6 hours as needed for nausea. 40 tablet 2  . polyethylene glycol (MIRALAX) packet Take 17 g by mouth daily. (Patient taking differently: Take 17 g by mouth as needed. ) 14 each 0  . predniSONE (DELTASONE) 5 MG tablet Take 10 mg by mouth daily with breakfast.     No current facility-administered medications on file prior to visit.    BP 148/76 mmHg  Pulse 77  Temp(Src) 98.4 F (36.9 C) (Oral)  Resp 16  Ht 5' 6.5" (1.689 m)  Wt 117 lb (53.071 kg)  BMI 18.60 kg/m2  SpO2 100%       Objective:   Physical Exam  Constitutional: She is oriented to person, place, and time.  Cachectic appear AA female, NAD  HENT:  Head: Normocephalic and atraumatic.  Cardiovascular: Normal rate and regular rhythm.   No murmur heard. Pulmonary/Chest: Effort normal and breath sounds normal. No  respiratory distress. She has no wheezes. She has no rales. She exhibits no tenderness.  Musculoskeletal: She exhibits no edema.  Lymphadenopathy:    She has no cervical adenopathy.  Neurological: She is  alert and oriented to person, place, and time.  Psychiatric: She has a normal mood and affect. Her behavior is normal. Judgment and thought content normal.          Assessment & Plan:

## 2015-10-15 NOTE — Assessment & Plan Note (Signed)
Lab Results  Component Value Date   WBC 9.8 10/07/2015   HGB 11.2* 10/07/2015   HCT 35.2* 10/07/2015   MCV 77.5* 10/07/2015   PLT 406* 10/07/2015   Stable.  Management per hematology.

## 2015-10-15 NOTE — Telephone Encounter (Signed)
Called patient to see if she would come back for an appointment with RN for an annual wellness visit

## 2015-10-15 NOTE — Progress Notes (Signed)
Pre visit review using our clinic review tool, if applicable. No additional management support is needed unless otherwise documented below in the visit note. 

## 2015-10-15 NOTE — Patient Instructions (Signed)
Please complete lab work prior to leaving. Keep upcoming appointment with gastroenterology. Start cipro for possible Urinary tract infection.  Add ensure one can twice daily. Make sure to be eating 3 well balanced meals and healthy high calorie snacks such as dried fruit/nuts. Weigh daily.  Call if further weight loss.

## 2015-10-15 NOTE — Assessment & Plan Note (Signed)
Pt feels that this is improved.  Management per rheumatology.

## 2015-10-15 NOTE — Assessment & Plan Note (Addendum)
Will try to obtain more hx from her daughter re: seizure like episode, and then plan referral to neurology. Refer for holter monitor.

## 2015-10-15 NOTE — Assessment & Plan Note (Signed)
Repeat TSH,  Continue megace- pt feels that this is helping her appetite, discussed dietary changes as outlined in AVS.

## 2015-10-23 ENCOUNTER — Ambulatory Visit (AMBULATORY_SURGERY_CENTER): Payer: Medicare Other | Admitting: Gastroenterology

## 2015-10-23 ENCOUNTER — Encounter: Payer: Self-pay | Admitting: Gastroenterology

## 2015-10-23 VITALS — BP 157/63 | HR 71 | Temp 97.8°F | Resp 67 | Ht 64.0 in | Wt 118.0 lb

## 2015-10-23 DIAGNOSIS — R634 Abnormal weight loss: Secondary | ICD-10-CM | POA: Diagnosis present

## 2015-10-23 DIAGNOSIS — D509 Iron deficiency anemia, unspecified: Secondary | ICD-10-CM | POA: Diagnosis not present

## 2015-10-23 DIAGNOSIS — Z86010 Personal history of colonic polyps: Secondary | ICD-10-CM

## 2015-10-23 MED ORDER — SODIUM CHLORIDE 0.9 % IV SOLN: 500.0000 mL | INTRAVENOUS | Status: DC

## 2015-10-23 NOTE — Progress Notes (Signed)
A/ox3, pleased with MAC, report to RN 

## 2015-10-23 NOTE — Patient Instructions (Signed)
YOU HAD AN ENDOSCOPIC PROCEDURE TODAY AT THE [facility]:   Refer to the procedure report that was given to you for any specific questions about what was found during the examination.  If the procedure report does not answer your questions, please call your gastroenterologist to clarify.  If you requested that your care partner not be given the details of your procedure findings, then the procedure report has been included in a sealed envelope for you to review at your convenience later.  YOU SHOULD EXPECT: Some feelings of bloating in the abdomen. Passage of more gas than usual.  Walking can help get rid of the air that was put into your GI tract during the procedure and reduce the bloating. If you had a lower endoscopy (such as a colonoscopy or flexible sigmoidoscopy) you may notice spotting of blood in your stool or on the toilet paper. If you underwent a bowel prep for your procedure, you may not have a normal bowel movement for a few days.  Please Note:  You might notice some irritation and congestion in your nose or some drainage.  This is from the oxygen used during your procedure.  There is no need for concern and it should clear up in a day or so.  SYMPTOMS TO REPORT IMMEDIATELY:   Following lower endoscopy (colonoscopy or flexible sigmoidoscopy):  Excessive amounts of blood in the stool  Significant tenderness or worsening of abdominal pains  Swelling of the abdomen that is new, acute  Fever of 100F or higher   Following upper endoscopy (EGD)  Vomiting of blood or coffee ground material  New chest pain or pain under the shoulder blades  Painful or persistently difficult swallowing  New shortness of breath  Fever of 100F or higher  Black, tarry-looking stools  For urgent or emergent issues, a gastroenterologist can be reached at any hour by calling [phone].   DIET: Your first meal following the procedure should be a small meal and then it is ok to progress to  your normal diet. Heavy or fried foods are harder to digest and may make you feel nauseous or bloated.  Likewise, meals heavy in dairy and vegetables can increase bloating.  Drink plenty of fluids but you should avoid alcoholic beverages for 24 hours.  ACTIVITY:  You should plan to take it easy for the rest of today and you should NOT DRIVE or use heavy machinery until tomorrow (because of the sedation medicines used during the test).    FOLLOW UP: Our staff will call the number listed on your records the next business day following your procedure to check on you and address any questions or concerns that you may have regarding the information given to you following your procedure. If we do not reach you, we will leave a message.  However, if you are feeling well and you are not experiencing any problems, there is no need to return our call.  We will assume that you have returned to your regular daily activities without incident.  If any biopsies were taken you will be contacted by phone or by letter within the next 1-3 weeks.  Please call us at [phone] if you have not heard about the biopsies in 3 weeks.    SIGNATURES/CONFIDENTIALITY: You and/or your care partner have signed paperwork which will be entered into your electronic medical record.  These signatures attest to the fact that that the information above on your After Visit Summary has been reviewed   and is understood.  Full responsibility of the confidentiality of this discharge information lies with you and/or your care-partner. 

## 2015-10-24 ENCOUNTER — Telehealth: Payer: Self-pay

## 2015-10-24 NOTE — Op Note (Signed)
[place]  [address]. [place], [id number]   ENDOSCOPY PROCEDURE REPORT  PATIENT: Nancy, [name]  MR#: [id number] BIRTHDATE: [birth year] , 60  yrs. old GENDER: female ENDOSCOPIST: Harl Bowie, MD REFERRED BY:  Jeri LagerConley Canal, N.P. PROCEDURE DATE:  10/23/2015 PROCEDURE:  EGD, diagnostic ASA CLASS:     Class II INDICATIONS:  weight loss. MEDICATIONS: Propofol 120 IV TOPICAL ANESTHETIC: none  DESCRIPTION OF PROCEDURE: After the risks benefits and alternatives of the procedure were thoroughly explained, informed consent was obtained.  The LB [id number] endoscope was introduced through the mouth and advanced to the second portion of the duodenum , Without limitations.  The instrument was slowly withdrawn as the mucosa was fully examined.   EXAM: The esophagus and gastroesophageal junction were completely normal in appearance.  The stomach was entered and closely examined.The antrum, angularis, and lesser curvature were well visualized, including a retroflexed view of the cardia and fundus. The stomach wall was normally distensable.  The scope passed easily through the pylorus into the duodenum.  Retroflexed views revealed no abnormalities.     The scope was then withdrawn from the patient and the procedure completed.  COMPLICATIONS: There were no immediate complications.  ENDOSCOPIC IMPRESSION: Normal appearing esophagus and GE junction, the stomach was well visualized and normal in appearance, normal appearing duodenum  RECOMMENDATIONS: Proceed with a Colonoscopy.    eSigned:  Harl Bowie, MD 10/23/2015 3:09 PM

## 2015-10-24 NOTE — Telephone Encounter (Signed)
  Follow up Call-  Call back number 10/23/2015 03/31/2013  Post procedure Call Back phone  # [phone]  Permission to leave phone message Yes Yes     Patient questions:  Do you have a fever, pain , or abdominal swelling? No. Pain Score  0 *  Have you tolerated food without any problems? Yes.    Have you been able to return to your normal activities? Yes.    Do you have any questions about your discharge instructions: Diet   No. Medications  No. Follow up visit  No.  Do you have questions or concerns about your Care? No.  Actions: * If pain score is 4 or above: No action needed, pain <4.

## 2015-10-24 NOTE — Op Note (Signed)
[place]  [address]. [place], [address]   COLONOSCOPY PROCEDURE REPORT  PATIENT: Nancy, [name]  MR#: [id number] BIRTHDATE: [birth year] , 74  yrs. old GENDER: female ENDOSCOPIST: Harl Bowie, MD REFERRED [id number] O' Sullivan, N.P. PROCEDURE DATE:  10/23/2015 PROCEDURE:   Colonoscopy, diagnostic First Screening Colonoscopy - Avg.  risk and is 50 yrs.  old or older - No.  Prior Negative Screening - Now for repeat screening. N/A  History of Adenoma - Now for follow-up colonoscopy & has been > or = to 3 yrs.  No.  It has been less than 3 yrs since last colonoscopy.  Other: See Comments History of Adenoma - Now for follow-up colonoscopy & has been > or = to 3 yrs.  Polyps removed today? No ASA CLASS:   Class II INDICATIONS:Unexplained iron deficiency anemia and PH Colon Adenoma.  MEDICATIONS: Propofol 80 IV  DESCRIPTION OF PROCEDURE:   After the risks benefits and alternatives of the procedure were thoroughly explained, informed consent was obtained.  The digital rectal exam revealed no abnormalities of the rectum.   The LB [id number]  endoscope was introduced through the anus and advanced to the cecum, which was identified by both the appendix and ileocecal valve. No adverse events experienced.   The quality of the prep was good.  The instrument was then slowly withdrawn as the colon was fully examined. Estimated blood loss is zero unless otherwise noted in this procedure report.   COLON FINDINGS: The colonic mucosa appeared normal throughout the entire examined colon.  Retroflexed views revealed internal hemorrhoids. The time to cecum = 4.1 Withdrawal time = 12.4   The scope was withdrawn and the procedure completed. COMPLICATIONS: There were no immediate complications.  ENDOSCOPIC IMPRESSION: The colonic mucosa appeared normal throughout the entire examined colon  RECOMMENDATIONS: Given your age, you will not need another colonoscopy for  colon cancer screening or polyp surveillance.  These types of tests usually stop around the age 31.  eSigned:  Harl Bowie, MD 10/23/2015 3:13 PM

## 2015-10-28 ENCOUNTER — Telehealth: Payer: Self-pay | Admitting: Family

## 2015-10-28 DIAGNOSIS — R569 Unspecified convulsions: Secondary | ICD-10-CM

## 2015-10-28 NOTE — Telephone Encounter (Signed)
See order.  Neurosurgery was ordered in error.

## 2015-10-29 DIAGNOSIS — M170 Bilateral primary osteoarthritis of knee: Secondary | ICD-10-CM | POA: Diagnosis not present

## 2015-10-30 ENCOUNTER — Other Ambulatory Visit: Payer: Self-pay | Admitting: Family

## 2015-11-12 ENCOUNTER — Telehealth: Payer: Self-pay | Admitting: Family

## 2015-11-12 ENCOUNTER — Ambulatory Visit: Payer: Medicare Other | Admitting: Family

## 2015-11-12 ENCOUNTER — Ambulatory Visit (INDEPENDENT_AMBULATORY_CARE_PROVIDER_SITE_OTHER): Payer: Medicare Other

## 2015-11-12 DIAGNOSIS — R55 Syncope and collapse: Secondary | ICD-10-CM | POA: Diagnosis not present

## 2015-11-12 NOTE — Telephone Encounter (Signed)
Error

## 2015-11-12 NOTE — Telephone Encounter (Signed)
No charge. 

## 2015-11-12 NOTE — Telephone Encounter (Signed)
Patient called to cancel appt today, patient had a mix up with our appointment and cardiology appointment. Patient did reschedule appointment for tomorrow with [facility]. Charge NOS Fee?

## 2015-11-13 ENCOUNTER — Ambulatory Visit (INDEPENDENT_AMBULATORY_CARE_PROVIDER_SITE_OTHER): Payer: Medicare Other | Admitting: Family

## 2015-11-13 VITALS — BP 150/78 | HR 76 | Temp 98.2°F | Resp 24 | Ht 66.5 in | Wt 117.0 lb

## 2015-11-13 DIAGNOSIS — R634 Abnormal weight loss: Secondary | ICD-10-CM

## 2015-11-13 DIAGNOSIS — R55 Syncope and collapse: Secondary | ICD-10-CM

## 2015-11-13 MED ORDER — LEVOTHYROXINE SODIUM 75 MCG PO TABS: 75.0000 ug | ORAL_TABLET | Freq: Every day | ORAL | Status: DC

## 2015-11-13 NOTE — Assessment & Plan Note (Signed)
No further episodes. Has apt with neuro on 12/09/15. Advised pt to keep this appointment.

## 2015-11-13 NOTE — Progress Notes (Signed)
Pre visit review using our clinic review tool, if applicable. No additional management support is needed unless otherwise documented below in the visit note. 

## 2015-11-13 NOTE — Patient Instructions (Signed)
Continue to weigh yourself regularly.  Call if you develop any further weight loss. Continue megace.

## 2015-11-13 NOTE — Assessment & Plan Note (Signed)
Stable on megace.  CT chest/abd/pelvis, TSH, colo and endo all normal. Continue megace, pt to monitor weight at home and call if further weight loss.

## 2015-11-13 NOTE — Progress Notes (Signed)
Subjective:    [name], female    DOB: 1941-03-31, 75 y.o.   MRN: [id number]  HPI   [name] is a 75 yr old female who presents today for follow up of her weight loss. Last visit she was placed on megace. Notes improvement in her appetite on megace. Her weight has remained stable however over the last 1 month.  Wt Readings from Last 3 Encounters:  11/13/15 117 lb (53.071 kg)  10/23/15 118 lb (53.524 kg)  10/15/15 117 lb (53.071 kg)   She reports that she is feeling much better. Not feeling as weak. No syncopal or seizure like episodes.     Review of Systems See HPI  Past Medical History  Diagnosis Date  . Allergy   . Anemia     iron deficiency  . Depression   . Hyperlipidemia   . Hypertension   . Urinary incontinence   . Rheumatoid arteritis   . Cataract   . Thyroid disease   . TIA (transient ischemic attack) 2016  . Osteoporosis 01/01/2015  . 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 Colon polyps     Social History   Social History  . Marital Status: Divorced    Spouse Name: N/A  . Number of Children: 8  . Years of Education: N/A   Occupational History  .      Retired from [employer]  . Smoking status: Former Smoker -- 32 years    Types: E-cigarettes    Quit date: 12/13/2014  . Smokeless tobacco: Never Used  . Alcohol Use: No  . Drug Use: No  . Sexual Activity: Not on file   Other Topics Concern  . Not on file   Social History Narrative    Past Surgical History  Procedure Laterality Date  . Abdominal hysterectomy  1976  . Tumor removal  last was 1976    left ankle x3  . Cataract extraction Right 2016    Family History  Problem Relation Age of Onset  . Hypertension Father   . Heart disease Sister 29    CABG x 3  . Cirrhosis Brother   . Alcohol abuse Brother   . Colitis Daughter   . Colon polyps Daughter   . Hypertension Son   . Kidney disease Son     transplant due to kidney problems after  [facility]  . Arthritis Other   . Coronary artery disease Other   . Hyperlipidemia Other   . Hypertension Other   . Stroke Sister     died 24  . Arthritis Sister   . Hypertension Sister   . Thyroid disease Daughter     x 3  . Cirrhosis Brother 47    ETOH abuse  . Colon cancer Father   . Ulcerative colitis Mother   . Parkinson's disease Mother     Allergies  Allergen Reactions  . Amlodipine Other (See Comments)    dizziness  . Iron Nausea And Vomiting    Can take infusions     Current Outpatient Prescriptions on File Prior to Visit  Medication Sig Dispense Refill  . aspirin 325 MG tablet Take 1 tablet (325 mg total) by mouth daily.    Marland Kitchen atorvastatin (LIPITOR) 20 MG tablet TAKE 1 TABLET BY MOUTH EVERY DAY AT 6PM 30 tablet 0  . bisacodyl (DULCOLAX) 10 MG suppository Place 1 suppository (10 mg total) rectally daily as needed for  moderate constipation (if Miralax ineffective). 12 suppository 0  . folic acid (FOLVITE) 1 MG tablet Take 1 mg by mouth daily.     Marland Kitchen leflunomide (ARAVA) 10 MG tablet Take 10 mg by mouth daily.  3  . levothyroxine (SYNTHROID, LEVOTHROID) 75 MCG tablet Take 1 tablet (75 mcg total) by mouth daily. 30 tablet 1  . loratadine (CLARITIN) 10 MG tablet Take 10 mg by mouth daily as needed. Reported on 10/23/2015    . megestrol (MEGACE) 400 MG/10ML suspension Take 10 mLs (400 mg total) by mouth daily. 480 mL 0  . methotrexate (RHEUMATREX) 2.5 MG tablet Take 15 mg by mouth once a week. Caution:Chemotherapy. Protect from light. Take on Fridays    . metoprolol succinate (TOPROL-XL) 50 MG 24 hr tablet TAKE 1 TABLET BY MOUTH EVERY DAY WITH OR IMMEDIATELY FOLLOWING A MEAL 90 tablet 1  . nystatin (MYCOSTATIN/NYSTOP) 100000 UNIT/GM POWD Apply between breasts twice daily 30 g 0  . omeprazole (PRILOSEC) 40 MG capsule TAKE ONE CAPSULE BY MOUTH EVERY DAY 30 capsule 5  . ondansetron (ZOFRAN ODT) 4 MG disintegrating tablet Take 1 tab, dissolve on the tongue every 6 hours as  needed for nausea. 40 tablet 2  . predniSONE (DELTASONE) 5 MG tablet Take 10 mg by mouth daily with breakfast.     No current facility-administered medications on file prior to visit.    BP 150/78 mmHg  Pulse 76  Temp(Src) 98.2 F (36.8 C) (Oral)  Resp 24  Ht 5' 6.5" (1.689 m)  Wt 117 lb (53.071 kg)  BMI 18.60 kg/m2  SpO2 100%       Objective:   Physical Exam  Constitutional: She is oriented to person, place, and time. She appears well-developed and well-nourished.  HENT:  Head: Normocephalic and atraumatic.  Cardiovascular: Normal rate, regular rhythm and normal heart sounds.   No murmur heard. Pulmonary/Chest: Effort normal and breath sounds normal. No respiratory distress. She has no wheezes.  Musculoskeletal: She exhibits no edema.  Neurological: She is alert and oriented to person, place, and time.  Psychiatric: She has a normal mood and affect. Her behavior is normal. Judgment and thought content normal.          Assessment & Plan:

## 2015-11-15 DIAGNOSIS — Z9849 Cataract extraction status, unspecified eye: Secondary | ICD-10-CM | POA: Diagnosis not present

## 2015-11-15 DIAGNOSIS — H40013 Open angle with borderline findings, low risk, bilateral: Secondary | ICD-10-CM | POA: Diagnosis not present

## 2015-11-17 ENCOUNTER — Other Ambulatory Visit: Payer: Self-pay | Admitting: Family

## 2015-12-09 ENCOUNTER — Ambulatory Visit (INDEPENDENT_AMBULATORY_CARE_PROVIDER_SITE_OTHER): Payer: Medicare Other | Admitting: Neurology

## 2015-12-09 ENCOUNTER — Encounter: Payer: Self-pay | Admitting: Neurology

## 2015-12-09 VITALS — BP 132/70 | HR 76 | Resp 16 | Wt 119.0 lb

## 2015-12-09 DIAGNOSIS — R55 Syncope and collapse: Secondary | ICD-10-CM | POA: Insufficient documentation

## 2015-12-09 NOTE — Patient Instructions (Addendum)
1. Schedule 48-hour EEG 2. Follow-up in 1 month 3. Increase fluid intake and avoid dehydration
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
## 2015-12-23 ENCOUNTER — Ambulatory Visit (INDEPENDENT_AMBULATORY_CARE_PROVIDER_SITE_OTHER): Payer: Medicare Other | Admitting: Neurology

## 2015-12-23 DIAGNOSIS — R55 Syncope and collapse: Secondary | ICD-10-CM | POA: Diagnosis not present

## 2016-01-01 ENCOUNTER — Telehealth: Payer: Self-pay | Admitting: Family Medicine

## 2016-01-01 NOTE — Telephone Encounter (Signed)
Lmovm to rtn my call. 

## 2016-01-01 NOTE — Telephone Encounter (Signed)
-----   Message from Cameron Sprang, MD sent at 01/01/2016  3:58 PM EST ----- Pls let her know brain wave test was normal, we will discuss results further on her f/u. Thanks

## 2016-01-01 NOTE — Procedures (Signed)
ELECTROENCEPHALOGRAM REPORT  Dates of Recording: 12/23/2015 to 12/25/2015  Patient's Name: Nancy [name] MRN: [id number] Date of Birth: 10-Apr-1941  Referring Provider: Dr. Ellouise Newer  Procedure: 48-hour ambulatory EEG  History: This is a 75 year old woman with recurrent episodes of near syncope, most recently with associated tremulousness. EEG for classification.  Medications: ASA, Lipitor, Dulcolax, Folvite, Megace, Levothyroxine, Arava, Toprol Xl, Prilosec, Zofran, Deltasone  Technical Summary: This is a 48-hour multichannel digital EEG recording measured by the international 10-20 system with electrodes applied with paste and impedances below 5000 ohms performed as portable with EKG monitoring.  The digital EEG was referentially recorded, reformatted, and digitally filtered in a variety of bipolar and referential montages for optimal display.    DESCRIPTION OF RECORDING: During maximal wakefulness, the background activity consisted of a symmetric 9.5 Hz posterior dominant rhythm which was reactive to eye opening.  There were no epileptiform discharges or focal slowing seen in wakefulness.  During the recording, the patient progresses through wakefulness, drowsiness, and Stage 2 sleep.  Again, there were no epileptiform discharges seen.  Events: There were no push button events. Patient did not complete diary.  There were no electrographic seizures seen.  EKG lead was unremarkable.  IMPRESSION: This 48-hour ambulatory EEG study is normal.    CLINICAL CORRELATION: A normal EEG does not exclude a clinical diagnosis of epilepsy. Typical events were not reported.  If further clinical questions remain, inpatient video EEG monitoring may be helpful.   Ellouise Newer, M.D.

## 2016-01-06 ENCOUNTER — Telehealth: Payer: Self-pay | Admitting: *Deleted

## 2016-01-06 MED ORDER — LEVOTHYROXINE SODIUM 75 MCG PO TABS: 75.0000 ug | ORAL_TABLET | Freq: Every day | ORAL | Status: DC

## 2016-01-06 NOTE — Telephone Encounter (Signed)
Lmovm to return my call. 

## 2016-01-06 NOTE — Telephone Encounter (Signed)
Received fax from [facility] for 90 day supply of levothyroxine. Refill sent.

## 2016-01-07 ENCOUNTER — Encounter: Payer: Self-pay | Admitting: Neurology

## 2016-01-07 ENCOUNTER — Ambulatory Visit (INDEPENDENT_AMBULATORY_CARE_PROVIDER_SITE_OTHER): Payer: Medicare Other | Admitting: Neurology

## 2016-01-07 VITALS — BP 128/70 | HR 76 | Resp 16 | Wt 120.0 lb

## 2016-01-07 DIAGNOSIS — F411 Generalized anxiety disorder: Secondary | ICD-10-CM | POA: Insufficient documentation

## 2016-01-07 DIAGNOSIS — R55 Syncope and collapse: Secondary | ICD-10-CM

## 2016-01-07 DIAGNOSIS — Z79899 Other long term (current) drug therapy: Secondary | ICD-10-CM | POA: Insufficient documentation
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
## 2016-01-07 NOTE — Patient Instructions (Signed)
1. Follow-up on as needed basis, call our office for any change in symptoms 2. Increase fluid hydration, continue exercise 3. If you lose consciousness, [place] driving laws indicate that you should stop driving

## 2016-01-28 DIAGNOSIS — M0579 Rheumatoid arthritis with rheumatoid factor of multiple sites without organ or systems involvement: Secondary | ICD-10-CM | POA: Diagnosis not present

## 2016-01-28 DIAGNOSIS — Z79899 Other long term (current) drug therapy: Secondary | ICD-10-CM | POA: Diagnosis not present

## 2016-01-31 ENCOUNTER — Other Ambulatory Visit: Payer: Self-pay | Admitting: Family

## 2016-02-11 ENCOUNTER — Encounter: Payer: Self-pay | Admitting: Family

## 2016-02-11 ENCOUNTER — Telehealth: Payer: Self-pay | Admitting: Family

## 2016-02-11 ENCOUNTER — Ambulatory Visit (INDEPENDENT_AMBULATORY_CARE_PROVIDER_SITE_OTHER): Payer: Medicare Other | Admitting: Family

## 2016-02-11 VITALS — BP 134/80 | HR 73 | Temp 98.3°F | Resp 16 | Ht 66.5 in | Wt 118.2 lb

## 2016-02-11 DIAGNOSIS — D649 Anemia, unspecified: Secondary | ICD-10-CM

## 2016-02-11 DIAGNOSIS — E785 Hyperlipidemia, unspecified: Secondary | ICD-10-CM

## 2016-02-11 DIAGNOSIS — R634 Abnormal weight loss: Secondary | ICD-10-CM

## 2016-02-11 DIAGNOSIS — E039 Hypothyroidism, unspecified: Secondary | ICD-10-CM

## 2016-02-11 DIAGNOSIS — M059 Rheumatoid arthritis with rheumatoid factor, unspecified: Secondary | ICD-10-CM

## 2016-02-11 DIAGNOSIS — D509 Iron deficiency anemia, unspecified: Secondary | ICD-10-CM

## 2016-02-11 LAB — CBC WITH DIFFERENTIAL/PLATELET
Basophils Absolute: 0 10*3/uL (ref 0.0–0.1)
Basophils Relative: 0.5 % (ref 0.0–3.0)
Eosinophils Absolute: 0.5 10*3/uL (ref 0.0–0.7)
Eosinophils Relative: 6 % — ABNORMAL HIGH (ref 0.0–5.0)
HCT: 41.3 % (ref 36.0–46.0)
Hemoglobin: 13.1 g/dL (ref 12.0–15.0)
Lymphocytes Relative: 26.4 % (ref 12.0–46.0)
Lymphs Abs: 2.4 10*3/uL (ref 0.7–4.0)
MCHC: 31.8 g/dL (ref 30.0–36.0)
MCV: 90.8 fl (ref 78.0–100.0)
Monocytes Absolute: 0.8 10*3/uL (ref 0.1–1.0)
Monocytes Relative: 9 % (ref 3.0–12.0)
Neutro Abs: 5.2 10*3/uL (ref 1.4–7.7)
Neutrophils Relative %: 58.1 % (ref 43.0–77.0)
Platelets: 283 10*3/uL (ref 150.0–400.0)
RBC: 4.55 Mil/uL (ref 3.87–5.11)
RDW: 15.5 % (ref 11.5–15.5)
WBC: 8.9 10*3/uL (ref 4.0–10.5)

## 2016-02-11 LAB — LIPID PANEL
Cholesterol: 211 mg/dL — ABNORMAL HIGH (ref 0–200)
HDL: 43.8 mg/dL (ref >39.00)
LDL Cholesterol: 143 mg/dL — ABNORMAL HIGH (ref 0–99)
NonHDL: 167.44
Total CHOL/HDL Ratio: 5
Triglycerides: 122 mg/dL (ref 0.0–149.0)
VLDL: 24.4 mg/dL (ref 0.0–40.0)

## 2016-02-11 LAB — IRON: Iron: 88 ug/dL (ref 42–145)

## 2016-02-11 LAB — FERRITIN: Ferritin: 335.6 ng/mL — ABNORMAL HIGH (ref 10.0–291.0)

## 2016-02-11 MED ORDER — ATORVASTATIN CALCIUM 40 MG PO TABS: 40.0000 mg | ORAL_TABLET | Freq: Every day | ORAL | Status: DC

## 2016-02-11 MED ORDER — ATORVASTATIN CALCIUM 20 MG PO TABS: ORAL_TABLET | ORAL | Status: DC

## 2016-02-11 NOTE — Patient Instructions (Signed)
Please complete lab work prior to leaving.   

## 2016-02-11 NOTE — Progress Notes (Signed)
Subjective:    [name], female    DOB: Dec 11, 1940, 75 y.o.   MRN: [id number]  HPI  Nancy [name] is a 75 yr old female who presents today for follow up.  1) Hypothyroid- Maintained on synthroid. Lab Results  Component Value Date   TSH 3.21 10/15/2015   2) Hyperlipidemia- not taking atorvastatin.  Would like to restart.  3) Anemia-  Lab Results  Component Value Date   WBC 9.8 10/07/2015   HGB 11.2* 10/07/2015   HCT 35.2* 10/07/2015   MCV 77.5* 10/07/2015   PLT 406* 10/07/2015   4) Weight loss- no longer taking megace. Appetite is good.  Wt Readings from Last 3 Encounters:  02/11/16 118 lb 3.2 oz (53.615 kg)  01/07/16 120 lb (54.432 kg)  12/09/15 119 lb (53.978 kg)   5) RA- had steroid injections in her knee by ortho which helped.    Lab Results  Component Value Date   CHOL 212* 05/15/2015   HDL 40 05/15/2015   LDLCALC 140* 05/15/2015   TRIG 162* 05/15/2015   CHOLHDL 5.3* 05/15/2015    Review of Systems  See HPI  Past Medical History  Diagnosis Date  . Allergy   . Anemia     iron deficiency  . Depression   . Hyperlipidemia   . Hypertension   . Urinary incontinence   . Rheumatoid arteritis   . Cataract   . Thyroid disease   . TIA (transient ischemic attack) 2016  . Osteoporosis 01/01/2015  . 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 Colon polyps     Social History   Social History  . Marital Status: Divorced    Spouse Name: N/A  . Number of Children: 8  . Years of Education: N/A   Occupational History  .      Retired from [employer]  . Smoking status: Current Some Day Smoker -- 32 years    Types: Cigarettes  . Smokeless tobacco: Never Used  . Alcohol Use: No  . Drug Use: No  . Sexual Activity: Not on file   Other Topics Concern  . Not on file   Social History Narrative    Past Surgical History  Procedure Laterality Date  . Abdominal hysterectomy  1976  . Tumor removal  last was 1976   left ankle x3  . Cataract extraction Bilateral     Family History  Problem Relation Age of Onset  . Hypertension Father   . Heart disease Sister 66    CABG x 3  . Cirrhosis Brother   . Alcohol abuse Brother   . Colitis Daughter   . Colon polyps Daughter   . Hypertension Son   . Kidney disease Son     transplant due to kidney problems after [facility]  . Arthritis Other   . Coronary artery disease Other   . Hyperlipidemia Other   . Hypertension Other   . Stroke Sister     died 57  . Arthritis Sister   . Hypertension Sister   . Thyroid disease Daughter     x 3  . Cirrhosis Brother 34    ETOH abuse  . Colon cancer Father   . Ulcerative colitis Mother   . Parkinson's disease Mother     Allergies  Allergen Reactions  . Amlodipine Other (See Comments)    dizziness  . Iron Nausea And Vomiting    Can take infusions  Current Outpatient Prescriptions on File Prior to Visit  Medication Sig Dispense Refill  . aspirin 325 MG tablet Take 1 tablet (325 mg total) by mouth daily.    Marland Kitchen atorvastatin (LIPITOR) 20 MG tablet TAKE 1 TABLET BY MOUTH EVERY DAY AT 6PM (Patient not taking: Reported on 02/11/2016) 30 tablet 0  . bisacodyl (DULCOLAX) 10 MG suppository Place 1 suppository (10 mg total) rectally daily as needed for moderate constipation (if Miralax ineffective). 12 suppository 0  . folic acid (FOLVITE) 1 MG tablet Take 1 mg by mouth daily.     Marland Kitchen leflunomide (ARAVA) 10 MG tablet Take 10 mg by mouth daily.  3  . levothyroxine (SYNTHROID, LEVOTHROID) 75 MCG tablet Take 1 tablet (75 mcg total) by mouth daily. 90 tablet 1  . loratadine (CLARITIN) 10 MG tablet Take 10 mg by mouth daily as needed. Reported on 10/23/2015    . methotrexate (RHEUMATREX) 2.5 MG tablet Take 15 mg by mouth once a week. Caution:Chemotherapy. Protect from light. Take on Fridays    . metoprolol succinate (TOPROL-XL) 50 MG 24 hr tablet TAKE 1 TABLET BY MOUTH EVERY DAY WITH OR IMMEDIATELY FOLLOWING A MEAL 90  tablet 1  . omeprazole (PRILOSEC) 40 MG capsule TAKE ONE CAPSULE BY MOUTH EVERY DAY (Patient taking differently: TAKE ONE CAPSULE BY MOUTH AS NEEDED) 30 capsule 5  . ondansetron (ZOFRAN ODT) 4 MG disintegrating tablet Take 1 tab, dissolve on the tongue every 6 hours as needed for nausea. (Patient not taking: Reported on 02/11/2016) 40 tablet 2  . predniSONE (DELTASONE) 5 MG tablet Take 5 mg by mouth 2 (two) times daily.      No current facility-administered medications on file prior to visit.    BP 134/80 mmHg  Pulse 73  Temp(Src) 98.3 F (36.8 C) (Oral)  Resp 16  Ht 5' 6.5" (1.689 m)  Wt 118 lb 3.2 oz (53.615 kg)  BMI 18.79 kg/m2  SpO2 100%        Objective:   Physical Exam  Constitutional: She is oriented to person, place, and time. She appears well-developed and well-nourished.  Cardiovascular: Normal rate, regular rhythm and normal heart sounds.   No murmur heard. Pulmonary/Chest: Effort normal and breath sounds normal. No respiratory distress. She has no wheezes.  Musculoskeletal: She exhibits no edema.  Neurological: She is alert and oriented to person, place, and time.  Psychiatric: She has a normal mood and affect. Her behavior is normal. Judgment and thought content normal.          Assessment & Plan:

## 2016-02-11 NOTE — Progress Notes (Signed)
Pre visit review using our clinic review tool, if applicable. No additional management support is needed unless otherwise documented below in the visit note. 

## 2016-02-11 NOTE — Telephone Encounter (Signed)
Please let pt know that blood count and iron look good.  Cholesterol elevated.  Increase lipitor from 20mg  to 40mg .  Follow up lipids in 6 weeks.

## 2016-02-12 NOTE — Telephone Encounter (Signed)
Notified pt and she voices understanding. Lab appt scheduled for 03/26/16 at 9am, future order entered.

## 2016-02-16 NOTE — Assessment & Plan Note (Signed)
Weight has stabilized. Monitor.

## 2016-02-16 NOTE — Assessment & Plan Note (Signed)
Stable on synthroid, continue same.   ?

## 2016-02-16 NOTE — Assessment & Plan Note (Signed)
Being treated by hematology and received IV iron infusion.

## 2016-02-16 NOTE — Assessment & Plan Note (Signed)
Stable on current meds.  Management per rheumatology/ortho.

## 2016-02-16 NOTE — Assessment & Plan Note (Signed)
Restart atorvastatin.

## 2016-03-13 IMAGING — CT CT HEAD W/O CM
1 series · 16 of 30 positions shown, 20 images · non-contrast
Comparison: Head CT December 16, 2014 and brain MRI December 17, 2014

CLINICAL DATA: Seizures with uncontrolled trembling

EXAM:
CT HEAD WITHOUT CONTRAST
TECHNIQUE: Contiguous axial images were obtained from the base of the skull
through the vertex without intravenous contrast.

[Series 2: head 4.8 h37s · axial · 0.42mm/px · z∈[-86,+66]mm · 16 of 36 slices shown, 20 images]
[im 2/36  brain]
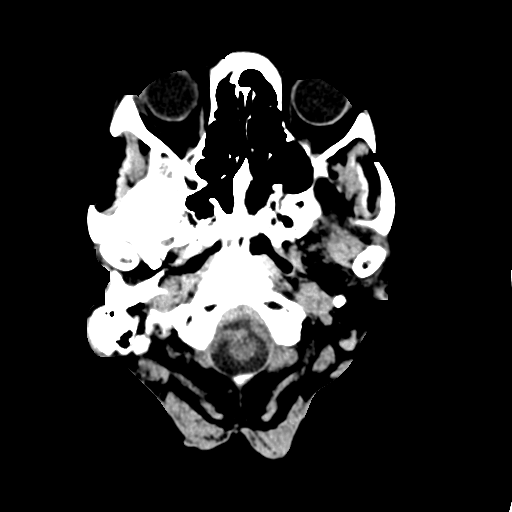
[im 2/36  bone]
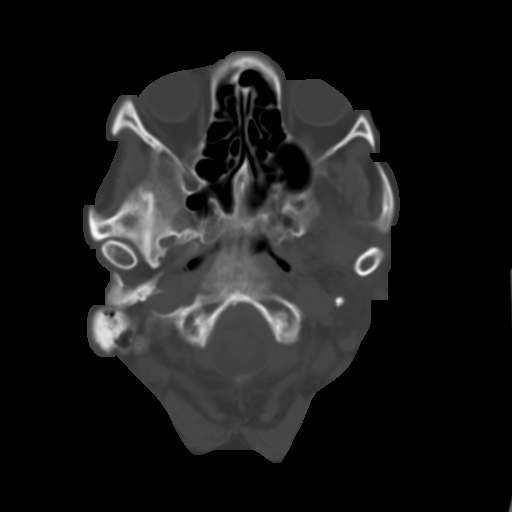
[im 4/36  brain]
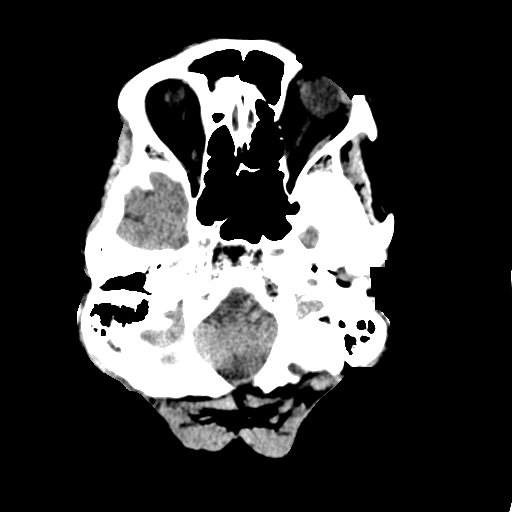
[im 7/36  brain]
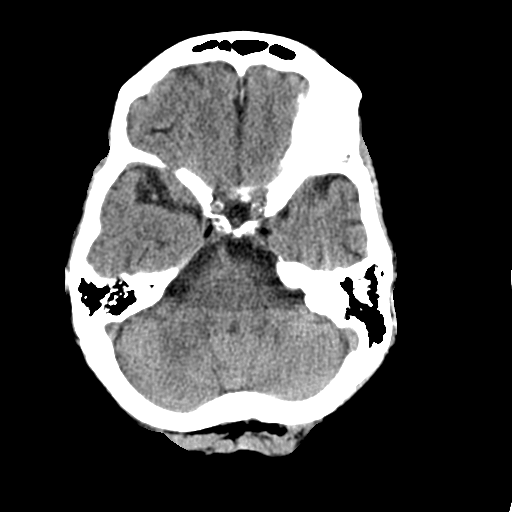
[im 9/36  brain]
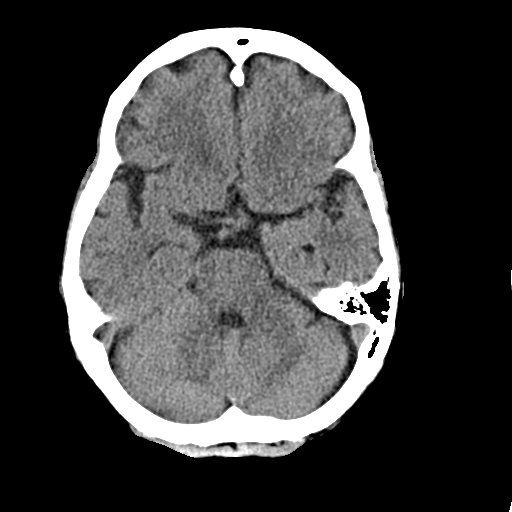
[im 10/36  brain]
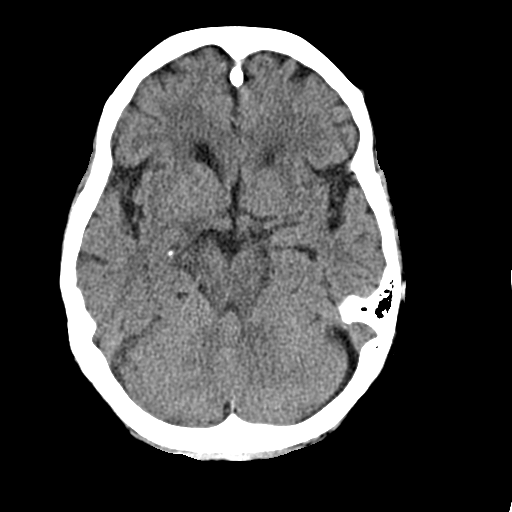
[im 10/36  bone]
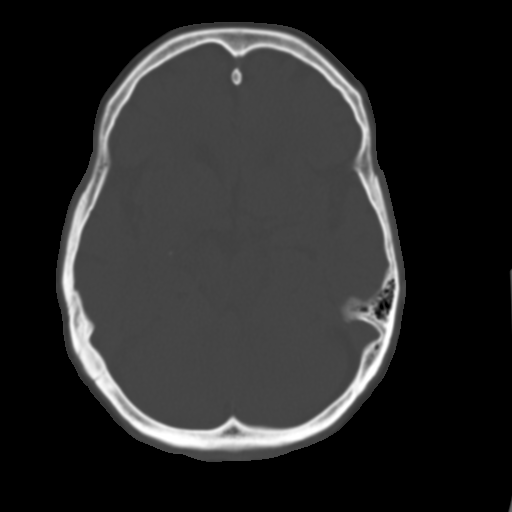
[im 13/36  brain]
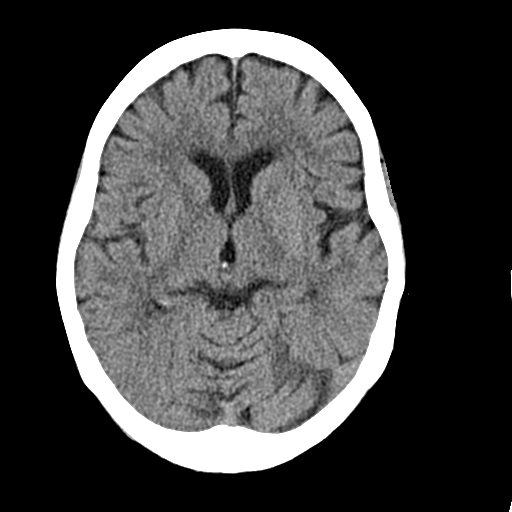
[im 15/36  brain]
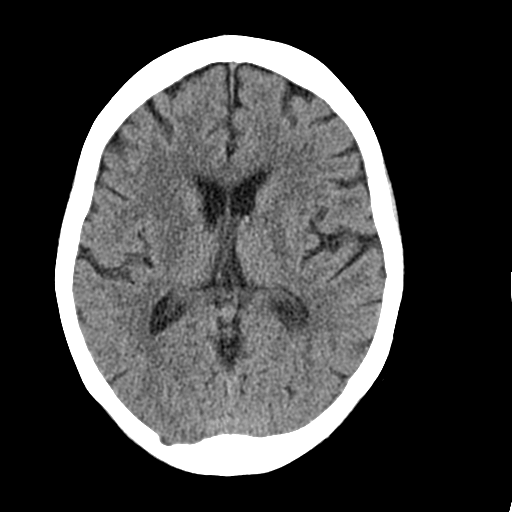
[im 17/36  brain]
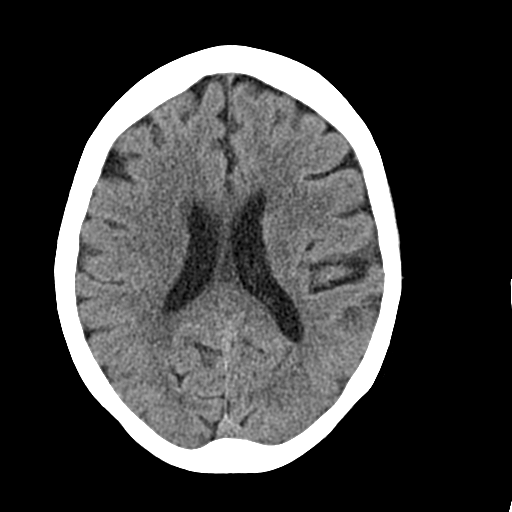
[im 19/36  brain]
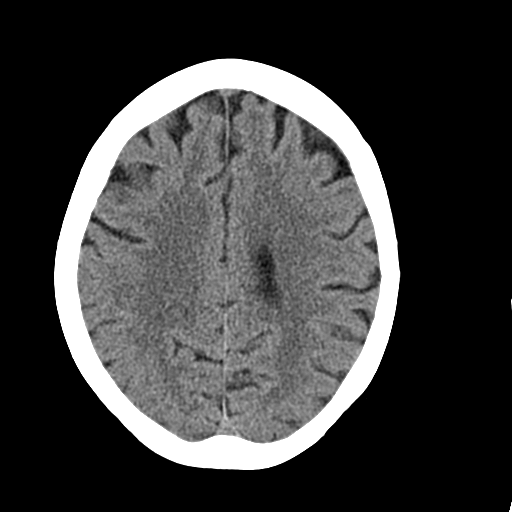
[im 19/36  bone]
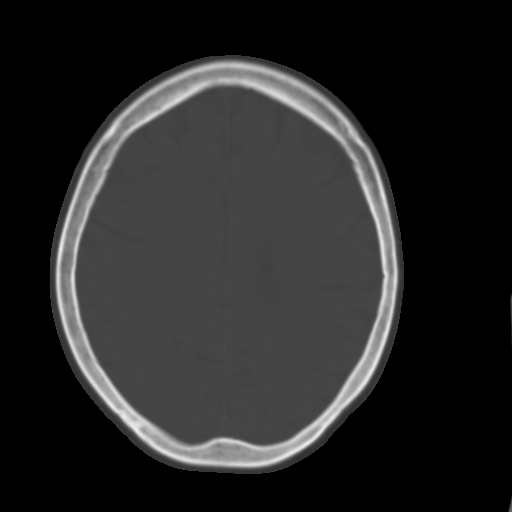
[im 21/36  brain]
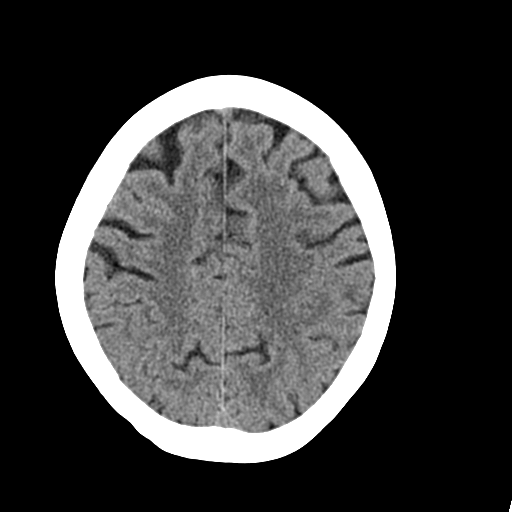
[im 23/36  brain]
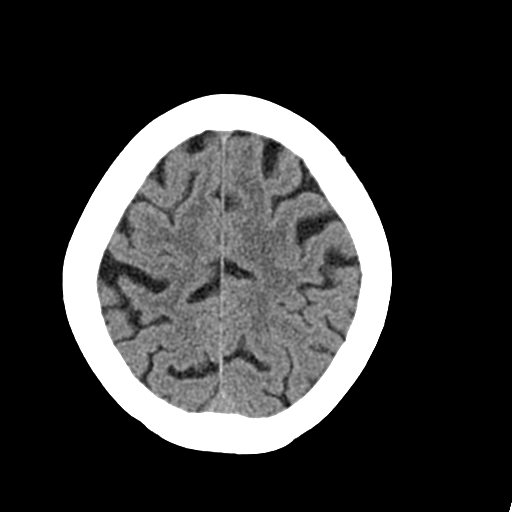
[im 26/36  brain]
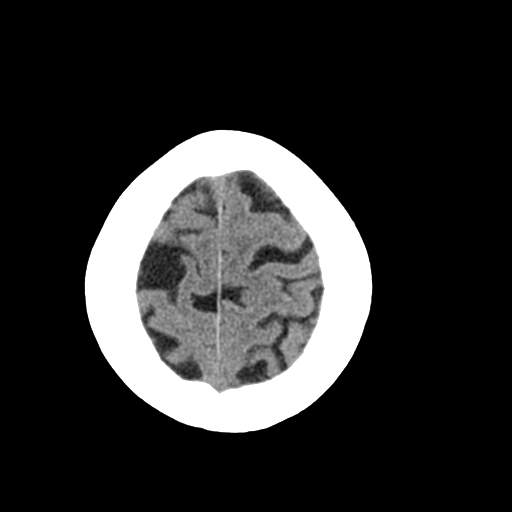
[im 27/36  brain]
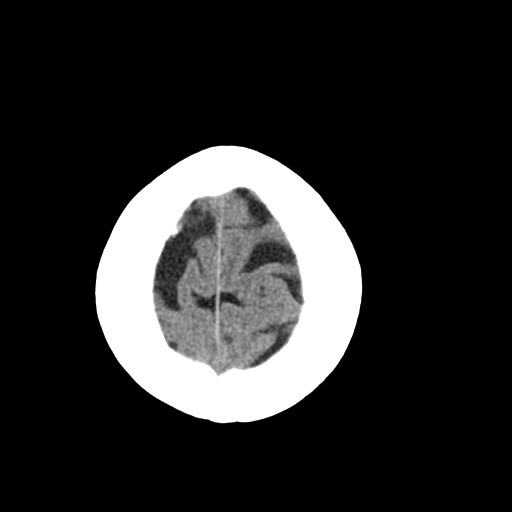
[im 27/36  bone]
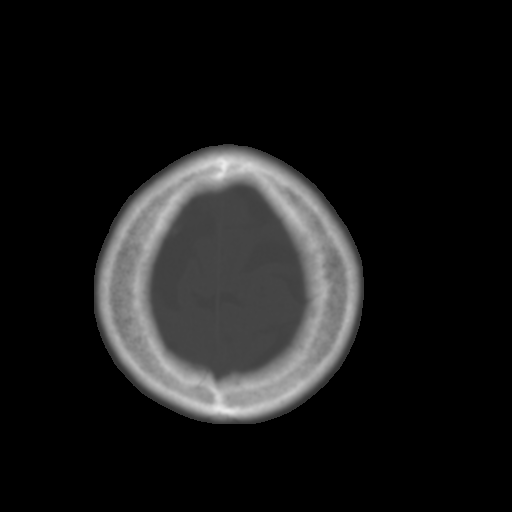
[im 29/36  brain]
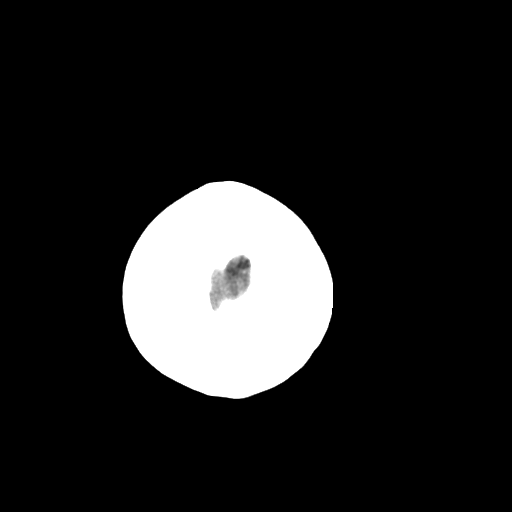
[im 32/36  brain]
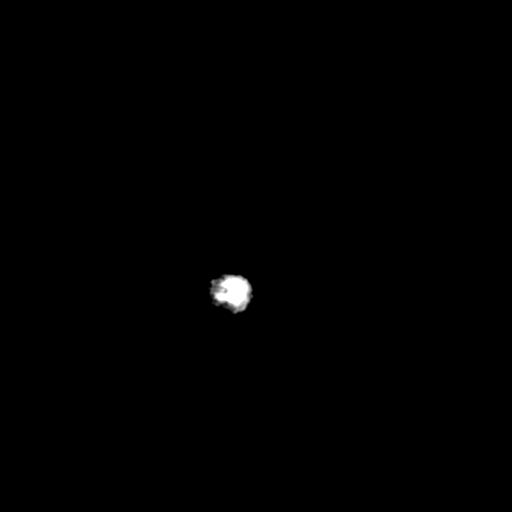
[im 34/36  brain]
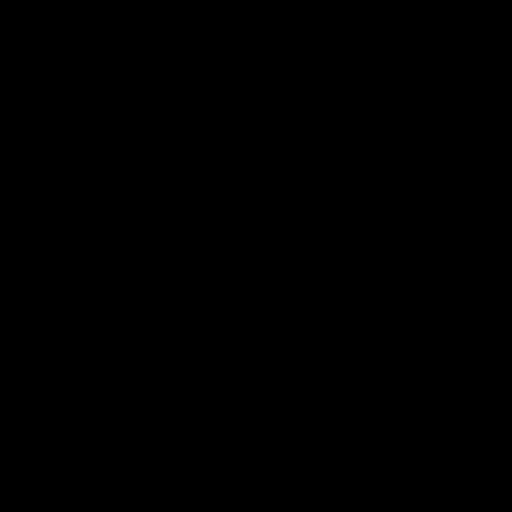

[16 of 30 positions shown; findings below may reference images not displayed]

FINDINGS: There is age related volume loss. There is no intracranial mass,
hemorrhage, extra-axial fluid collection, or midline shift. There is
rather minimal small vessel disease in the centra semiovale
bilaterally. Elsewhere gray-white compartments appear normal. No
acute infarct evident. The bony calvarium appears intact. The
mastoid air cells are clear.
IMPRESSION: Age related volume loss with rather minimal periventricular small
vessel disease. No intracranial mass, hemorrhage, or extra-axial
fluid collection. No acute appearing infarct.

## 2016-03-26 ENCOUNTER — Other Ambulatory Visit (INDEPENDENT_AMBULATORY_CARE_PROVIDER_SITE_OTHER): Payer: Medicare Other

## 2016-03-26 DIAGNOSIS — E785 Hyperlipidemia, unspecified: Secondary | ICD-10-CM | POA: Diagnosis not present

## 2016-03-26 LAB — LIPID PANEL
Cholesterol: 203 mg/dL — ABNORMAL HIGH (ref 0–200)
HDL: 41 mg/dL (ref >39.00)
LDL Cholesterol: 141 mg/dL — ABNORMAL HIGH (ref 0–99)
NonHDL: 161.59
Total CHOL/HDL Ratio: 5
Triglycerides: 102 mg/dL (ref 0.0–149.0)
VLDL: 20.4 mg/dL (ref 0.0–40.0)

## 2016-05-01 DIAGNOSIS — M0579 Rheumatoid arthritis with rheumatoid factor of multiple sites without organ or systems involvement: Secondary | ICD-10-CM | POA: Diagnosis not present

## 2016-05-01 DIAGNOSIS — Z79899 Other long term (current) drug therapy: Secondary | ICD-10-CM | POA: Diagnosis not present

## 2016-05-07 ENCOUNTER — Encounter: Payer: Self-pay | Admitting: Family

## 2016-05-07 ENCOUNTER — Ambulatory Visit (INDEPENDENT_AMBULATORY_CARE_PROVIDER_SITE_OTHER): Payer: Medicare Other | Admitting: Family

## 2016-05-07 VITALS — BP 130/80 | HR 67 | Temp 97.9°F | Resp 18 | Ht 66.5 in | Wt 121.0 lb

## 2016-05-07 DIAGNOSIS — M059 Rheumatoid arthritis with rheumatoid factor, unspecified: Secondary | ICD-10-CM

## 2016-05-07 DIAGNOSIS — E785 Hyperlipidemia, unspecified: Secondary | ICD-10-CM

## 2016-05-07 DIAGNOSIS — D509 Iron deficiency anemia, unspecified: Secondary | ICD-10-CM | POA: Diagnosis not present

## 2016-05-07 DIAGNOSIS — E039 Hypothyroidism, unspecified: Secondary | ICD-10-CM

## 2016-05-07 LAB — CBC WITH DIFFERENTIAL/PLATELET
Basophils Absolute: 0 10*3/uL (ref 0.0–0.1)
Basophils Relative: 0.3 % (ref 0.0–3.0)
Eosinophils Absolute: 0.1 10*3/uL (ref 0.0–0.7)
Eosinophils Relative: 1 % (ref 0.0–5.0)
HCT: 37.7 % (ref 36.0–46.0)
Hemoglobin: 12.1 g/dL (ref 12.0–15.0)
Lymphocytes Relative: 24 % (ref 12.0–46.0)
Lymphs Abs: 2.5 10*3/uL (ref 0.7–4.0)
MCHC: 32.1 g/dL (ref 30.0–36.0)
MCV: 91.5 fl (ref 78.0–100.0)
Monocytes Absolute: 0.7 10*3/uL (ref 0.1–1.0)
Monocytes Relative: 7.1 % (ref 3.0–12.0)
Neutro Abs: 7 10*3/uL (ref 1.4–7.7)
Neutrophils Relative %: 67.6 % (ref 43.0–77.0)
Platelets: 254 10*3/uL (ref 150.0–400.0)
RBC: 4.12 Mil/uL (ref 3.87–5.11)
RDW: 15.9 % — ABNORMAL HIGH (ref 11.5–15.5)
WBC: 10.3 10*3/uL (ref 4.0–10.5)

## 2016-05-07 LAB — LIPID PANEL
Cholesterol: 203 mg/dL — ABNORMAL HIGH (ref 0–200)
HDL: 48.3 mg/dL (ref >39.00)
LDL Cholesterol: 127 mg/dL — ABNORMAL HIGH (ref 0–99)
NonHDL: 154.29
Total CHOL/HDL Ratio: 4
Triglycerides: 138 mg/dL (ref 0.0–149.0)
VLDL: 27.6 mg/dL (ref 0.0–40.0)

## 2016-05-07 LAB — FERRITIN: Ferritin: 231.9 ng/mL (ref 10.0–291.0)

## 2016-05-07 LAB — TSH: TSH: 0.8 u[IU]/mL (ref 0.35–4.50)

## 2016-05-07 LAB — IRON: Iron: 65 ug/dL (ref 42–145)

## 2016-05-07 NOTE — Assessment & Plan Note (Signed)
Obtain follow up lipid panel.  Continue statin.  

## 2016-05-07 NOTE — Assessment & Plan Note (Signed)
Check iron, ferritin, cbc.

## 2016-05-07 NOTE — Progress Notes (Signed)
Subjective:    [name], female    DOB: [birth year], 75 y.o.   MRN: [id number]  HPI  Nancy [name] is a 75 yr old female who presents today for follow up.  1) Hyperlipidemia- maintained on atorvastatin.   Lab Results  Component Value Date   CHOL 203* 03/26/2016   HDL 41.00 03/26/2016   LDLCALC 141* 03/26/2016   TRIG 102.0 03/26/2016   CHOLHDL 5 03/26/2016   2) Hypothyroidism- pt got confused and stopped taking synthroid.  Notes + fatigue.   Lab Results  Component Value Date   TSH 3.21 10/15/2015   3) HTN- maintained on toprol xl 50mg . Denies dizziness.  BP Readings from Last 3 Encounters:  05/07/16 130/80  02/11/16 134/80  01/07/16 128/70   4) Anemia-  Lab Results  Component Value Date   WBC 8.9 02/11/2016   HGB 13.1 02/11/2016   HCT 41.3 02/11/2016   MCV 90.8 02/11/2016   PLT 283.0 02/11/2016   5) RA- following with Dr. Estanislado Pandy  Review of Systems See HPI  Past Medical History  Diagnosis Date  . Allergy   . Anemia     iron deficiency  . Depression   . Hyperlipidemia   . Hypertension   . Urinary incontinence   . Rheumatoid arteritis   . Cataract   . Thyroid disease   . TIA (transient ischemic attack) 2016  . Osteoporosis 01/01/2015  . 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 Colon polyps      Social History   Social History  . Marital Status: Divorced    Spouse Name: N/A  . Number of Children: 8  . Years of Education: N/A   Occupational History  .      Retired from [employer]  . Smoking status: Current Some Day Smoker -- 32 years    Types: Cigarettes  . Smokeless tobacco: Never Used  . Alcohol Use: No  . Drug Use: No  . Sexual Activity: Not on file   Other Topics Concern  . Not on file   Social History Narrative    Past Surgical History  Procedure Laterality Date  . Abdominal hysterectomy  1976  . Tumor removal  last was 1976    left ankle x3  . Cataract extraction Bilateral      Family History  Problem Relation Age of Onset  . Hypertension Father   . Heart disease Sister 11    CABG x 3  . Cirrhosis Brother   . Alcohol abuse Brother   . Colitis Daughter   . Colon polyps Daughter   . Hypertension Son   . Kidney disease Son     transplant due to kidney problems after [facility]  . Arthritis Other   . Coronary artery disease Other   . Hyperlipidemia Other   . Hypertension Other   . Stroke Sister     died 31  . Arthritis Sister   . Hypertension Sister   . Thyroid disease Daughter     x 3  . Cirrhosis Brother 27    ETOH abuse  . Colon cancer Father   . Ulcerative colitis Mother   . Parkinson's disease Mother     Allergies  Allergen Reactions  . Amlodipine Other (See Comments)    dizziness  . Iron Nausea And Vomiting    Can take infusions     Current Outpatient Prescriptions on File Prior to Visit  Medication  Sig Dispense Refill  . aspirin 325 MG tablet Take 1 tablet (325 mg total) by mouth daily.    Marland Kitchen atorvastatin (LIPITOR) 40 MG tablet Take 1 tablet (40 mg total) by mouth daily. 30 tablet 5  . bisacodyl (DULCOLAX) 10 MG suppository Place 1 suppository (10 mg total) rectally daily as needed for moderate constipation (if Miralax ineffective). 12 suppository 0  . folic acid (FOLVITE) 1 MG tablet Take 1 mg by mouth daily.     Marland Kitchen leflunomide (ARAVA) 10 MG tablet Take 10 mg by mouth daily.  3  . levothyroxine (SYNTHROID, LEVOTHROID) 75 MCG tablet Take 1 tablet (75 mcg total) by mouth daily. 90 tablet 1  . loratadine (CLARITIN) 10 MG tablet Take 10 mg by mouth daily as needed. Reported on 10/23/2015    . methotrexate (RHEUMATREX) 2.5 MG tablet Take 15 mg by mouth once a week. Caution:Chemotherapy. Protect from light. Take on Fridays    . metoprolol succinate (TOPROL-XL) 50 MG 24 hr tablet TAKE 1 TABLET BY MOUTH EVERY DAY WITH OR IMMEDIATELY FOLLOWING A MEAL 90 tablet 1  . omeprazole (PRILOSEC) 40 MG capsule TAKE ONE CAPSULE BY MOUTH EVERY DAY  (Patient taking differently: TAKE ONE CAPSULE BY MOUTH AS NEEDED) 30 capsule 5  . ondansetron (ZOFRAN ODT) 4 MG disintegrating tablet Take 1 tab, dissolve on the tongue every 6 hours as needed for nausea. 40 tablet 2  . predniSONE (DELTASONE) 5 MG tablet Take 5 mg by mouth 2 (two) times daily.      No current facility-administered medications on file prior to visit.    BP 130/80 mmHg  Pulse 67  Temp(Src) 97.9 F (36.6 C) (Oral)  Resp 18  Ht 5' 6.5" (1.689 m)  Wt 121 lb (54.885 kg)  BMI 19.24 kg/m2  SpO2 97%       Objective:   Physical Exam  Constitutional: She appears well-developed and well-nourished.  Cardiovascular: Normal rate, regular rhythm and normal heart sounds.   No murmur heard. Pulmonary/Chest: Effort normal and breath sounds normal. No respiratory distress. She has no wheezes.  Musculoskeletal:  Joint swelling noted bilateral fingers  Psychiatric: She has a normal mood and affect. Her behavior is normal. Judgment and thought content normal.          Assessment & Plan:

## 2016-05-07 NOTE — Assessment & Plan Note (Signed)
Obtain tsh, advised pt to restart synthroid. This is likely cause for her fatigue.

## 2016-05-07 NOTE — Patient Instructions (Signed)
Please complete lab work prior to leaving. Restart synthroid.

## 2016-05-07 NOTE — Progress Notes (Signed)
Pre visit review using our clinic review tool, if applicable. No additional management support is needed unless otherwise documented below in the visit note. 

## 2016-05-07 NOTE — Assessment & Plan Note (Signed)
Stable, managed by Dr. Estanislado Pandy (rheumatology).

## 2016-05-08 ENCOUNTER — Ambulatory Visit (INDEPENDENT_AMBULATORY_CARE_PROVIDER_SITE_OTHER): Payer: Medicare Other | Admitting: *Deleted

## 2016-05-08 DIAGNOSIS — I10 Essential (primary) hypertension: Secondary | ICD-10-CM

## 2016-05-08 NOTE — Progress Notes (Signed)
Unable to reach patient at time of CCM Call.  Left message for patient to return call when available.    Time spent: <1 minute

## 2016-05-11 NOTE — Progress Notes (Signed)
Pt called wanting to speak with nurse regarding chronic care management program. Advised her RN not available to take call at this time but will call her back when she returns. Pt states she is leaving to go out of town on Wednesday and will be gone for 10 days but should still be able to be reached on her Cell#.

## 2016-05-13 ENCOUNTER — Ambulatory Visit: Payer: Medicare Other | Admitting: Family

## 2016-08-04 ENCOUNTER — Other Ambulatory Visit: Payer: Self-pay | Admitting: Family

## 2016-08-07 DIAGNOSIS — J019 Acute sinusitis, unspecified: Secondary | ICD-10-CM | POA: Diagnosis not present

## 2016-08-07 DIAGNOSIS — Z79899 Other long term (current) drug therapy: Secondary | ICD-10-CM | POA: Diagnosis not present

## 2016-08-07 DIAGNOSIS — M7552 Bursitis of left shoulder: Secondary | ICD-10-CM | POA: Insufficient documentation

## 2016-08-07 DIAGNOSIS — M0579 Rheumatoid arthritis with rheumatoid factor of multiple sites without organ or systems involvement: Secondary | ICD-10-CM | POA: Diagnosis not present

## 2016-08-31 ENCOUNTER — Encounter: Payer: Self-pay | Admitting: Family

## 2016-08-31 ENCOUNTER — Ambulatory Visit (INDEPENDENT_AMBULATORY_CARE_PROVIDER_SITE_OTHER): Payer: Medicare Other | Admitting: Family

## 2016-08-31 VITALS — BP 146/70 | HR 83 | Temp 97.6°F | Resp 16 | Ht 67.0 in | Wt 123.2 lb

## 2016-08-31 DIAGNOSIS — I10 Essential (primary) hypertension: Secondary | ICD-10-CM

## 2016-08-31 DIAGNOSIS — Z23 Encounter for immunization: Secondary | ICD-10-CM

## 2016-08-31 DIAGNOSIS — R159 Full incontinence of feces: Secondary | ICD-10-CM | POA: Diagnosis not present

## 2016-08-31 MED ORDER — FLUTICASONE PROPIONATE 50 MCG/ACT NA SUSP: 2.0000 | Freq: Every day | NASAL | 5 refills | Status: DC

## 2016-08-31 NOTE — Patient Instructions (Signed)
Please complete stool studies and return to the lab at your earliest convenience.

## 2016-08-31 NOTE — Progress Notes (Signed)
Subjective:    [name], female    DOB: 04/28/1941, 76 y.o.   MRN: [id number]  HPI  Nancy [name] is a 75 yr old female who presents today for follow up.  1) HTN- she continues metoprolol. She denies CP or SOB.   BP Readings from Last 3 Encounters:  08/31/16 (!) 146/70  05/07/16 130/80  02/11/16 134/80   2) Fecal incontinence- notes intermittent fecal incontinence for months.  Reports that previous GI work up was normal.  She reports that this happens 2-3 times a month.  When it occurs her stool is loose.  Denies low back pain, numbness, denies urinary incontinence.    Reports that she was recently was treated for bronchitis.  Symptoms are improved.   Review of Systems See HPI  Past Medical History:  Diagnosis Date  . Allergy   . Anemia    iron deficiency  . 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 Cataract   . Colon polyps   . Depression   . Hyperlipidemia   . Hypertension   . Osteoporosis 01/01/2015  . Rheumatoid arteritis   . Thyroid disease   . TIA (transient ischemic attack) 2016  . Urinary incontinence      Social History   Social History  . Marital status: Divorced    Spouse name: N/A  . Number of children: 8  . Years of education: N/A   Occupational History  .  Retired    Retired from [employer]  . Smoking status: Current Some Day Smoker    Years: 32.00    Types: Cigarettes  . Smokeless tobacco: Never Used  . Alcohol use No  . Drug use: No  . Sexual activity: Not on file   Other Topics Concern  . Not on file   Social History Narrative  . No narrative on file    Past Surgical History:  Procedure Laterality Date  . ABDOMINAL HYSTERECTOMY  1976  . CATARACT EXTRACTION Bilateral   . 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 Hypertension Father   . Heart disease Sister 37    CABG x 3  . Cirrhosis Brother   . Alcohol abuse Brother   .  Colitis Daughter   . Colon polyps Daughter   . Hypertension Son   . Kidney disease Son     transplant due to kidney problems after [facility]  . Arthritis Other   . Coronary artery disease Other   . Hyperlipidemia Other   . Hypertension Other   . Stroke Sister     died 44  . Arthritis Sister   . Hypertension Sister   . Thyroid disease Daughter     x 3  . Cirrhosis Brother 69    ETOH abuse  . Colon cancer Father   . Ulcerative colitis Mother   . Parkinson's disease Mother     Allergies  Allergen Reactions  . Amlodipine Other (See Comments)    dizziness  . Iron Nausea And Vomiting    Can take infusions     Current Outpatient Prescriptions on File Prior to Visit  Medication Sig Dispense Refill  . aspirin 325 MG tablet Take 1 tablet (325 mg total) by mouth daily.    Marland Kitchen atorvastatin (LIPITOR) 40 MG tablet Take 1 tablet (40 mg total) by mouth daily. 30 tablet 5  .  bisacodyl (DULCOLAX) 10 MG suppository Place 1 suppository (10 mg total) rectally daily as needed for moderate constipation (if Miralax ineffective). 12 suppository 0  . folic acid (FOLVITE) 1 MG tablet Take 1 mg by mouth daily.     Marland Kitchen leflunomide (ARAVA) 10 MG tablet Take 10 mg by mouth daily.  3  . levothyroxine (SYNTHROID, LEVOTHROID) 75 MCG tablet Take 1 tablet (75 mcg total) by mouth daily. 90 tablet 1  . loratadine (CLARITIN) 10 MG tablet Take 10 mg by mouth daily as needed. Reported on 10/23/2015    . metoprolol succinate (TOPROL-XL) 50 MG 24 hr tablet TAKE 1 TABLET BY MOUTH EVERY DAY WITH OR IMMEDIATELY FOLLOWING A MEAL 90 tablet 1  . omeprazole (PRILOSEC) 40 MG capsule TAKE ONE CAPSULE BY MOUTH EVERY DAY (Patient taking differently: TAKE ONE CAPSULE BY MOUTH AS NEEDED) 30 capsule 5  . ondansetron (ZOFRAN ODT) 4 MG disintegrating tablet Take 1 tab, dissolve on the tongue every 6 hours as needed for nausea. 40 tablet 2  . predniSONE (DELTASONE) 5 MG tablet Take 5 mg by mouth 2 (two) times daily.      No current  facility-administered medications on file prior to visit.     BP (!) 146/70 (BP Location: Right Arm, Cuff Size: Normal)   Pulse 83   Temp 97.6 F (36.4 C) (Oral)   Resp 16   Ht 5\' 7"  (1.702 m)   Wt 123 lb 3.2 oz (55.9 kg)   SpO2 98% Comment: room air  BMI 19.30 kg/m       Objective:   Physical Exam  Constitutional: She appears well-developed and well-nourished.  Cardiovascular: Normal rate, regular rhythm and normal heart sounds.   No murmur heard. Pulmonary/Chest: Effort normal and breath sounds normal. No respiratory distress. She has no wheezes.  Genitourinary:  Genitourinary Comments: No internal/external hemorrhoids noted.  Normal rectal exam, except for decreased rectal tone noted.   Psychiatric: She has a normal mood and affect. Her behavior is normal. Judgment and thought content normal.          Assessment & Plan:  Fecal incontinence- she does have some decreased rectal tone but no other neurological issues.  She has had some loose stools. Will obtain stool studies to further evaluate.

## 2016-08-31 NOTE — Assessment & Plan Note (Signed)
BP is acceptable for her age. Continue metoprolol.  Flu shot today.

## 2016-08-31 NOTE — Progress Notes (Signed)
Pre visit review using our clinic review tool, if applicable. No additional management support is needed unless otherwise documented below in the visit note. 

## 2016-09-01 ENCOUNTER — Telehealth: Payer: Self-pay | Admitting: Family

## 2016-09-01 DIAGNOSIS — K6289 Other specified diseases of anus and rectum: Secondary | ICD-10-CM

## 2016-09-01 NOTE — Telephone Encounter (Signed)
-----   Message from Mosie Lukes, MD sent at 08/31/2016  8:04 PM EDT ----- She might benefit from pelvic floor rehab. Could make referral to help her to  PT. ----- Message ----- From: Debbrah Alar, NP Sent: 08/31/2016   3:09 PM To: Mosie Lukes, MD  Hi,   This patient is having intermittent fecal incontinence. Poor rectal tone on exam, some intermittent diarrhea. Ordering stool studies. No neurologic complaints, denies urinary incontinence. Any other thoughts on work up?   Thanks,  [employer]

## 2016-09-01 NOTE — Telephone Encounter (Signed)
Please let pt know that I would like to refer her to pelvic floor rehab to see if it will help with her incontinence issues.

## 2016-09-01 NOTE — Telephone Encounter (Signed)
Called patient, agreed to referal

## 2016-09-04 LAB — OVA AND PARASITE EXAMINATION: OP: NONE SEEN

## 2016-09-04 LAB — CLOSTRIDIUM DIFFICILE BY PCR: Toxigenic C. Difficile by PCR: NOT DETECTED

## 2016-09-05 ENCOUNTER — Encounter: Payer: Self-pay | Admitting: Family

## 2016-09-07 LAB — STOOL CULTURE

## 2016-09-08 ENCOUNTER — Ambulatory Visit: Payer: Medicare Other | Admitting: Family

## 2016-12-02 ENCOUNTER — Encounter: Payer: Self-pay | Admitting: Family

## 2016-12-02 ENCOUNTER — Ambulatory Visit (INDEPENDENT_AMBULATORY_CARE_PROVIDER_SITE_OTHER): Payer: Medicare Other | Admitting: Family

## 2016-12-02 ENCOUNTER — Other Ambulatory Visit: Payer: Self-pay | Admitting: Family

## 2016-12-02 ENCOUNTER — Ambulatory Visit: Payer: Medicare Other | Admitting: Family

## 2016-12-02 VITALS — BP 140/68 | HR 57 | Temp 98.4°F | Resp 16 | Ht 67.0 in | Wt 121.4 lb

## 2016-12-02 DIAGNOSIS — E039 Hypothyroidism, unspecified: Secondary | ICD-10-CM

## 2016-12-02 DIAGNOSIS — I10 Essential (primary) hypertension: Secondary | ICD-10-CM | POA: Diagnosis not present

## 2016-12-02 LAB — BASIC METABOLIC PANEL
BUN: 12 mg/dL (ref 6–23)
CO2: 29 mEq/L (ref 19–32)
Calcium: 9.7 mg/dL (ref 8.4–10.5)
Chloride: 107 mEq/L (ref 96–112)
Creatinine, Ser: 0.68 mg/dL (ref 0.40–1.20)
GFR: 108.27 mL/min (ref >60.00)
Glucose, Bld: 121 mg/dL — ABNORMAL HIGH (ref 70–99)
Potassium: 4 mEq/L (ref 3.5–5.1)
Sodium: 142 mEq/L (ref 135–145)

## 2016-12-02 LAB — TSH: TSH: 5.11 u[IU]/mL — ABNORMAL HIGH (ref 0.35–4.50)

## 2016-12-02 NOTE — Progress Notes (Signed)
Pre visit review using our clinic review tool, if applicable. No additional management support is needed unless otherwise documented below in the visit note. 

## 2016-12-02 NOTE — Progress Notes (Signed)
Subjective:    [name], female    DOB: 07/04/1941, 76 y.o.   MRN: [id number]  HPI   Nancy [name] is a 76 yr old female who presents today for follow up.  HTN- currently maintained on toprol xl 50mg .  Denies CP or shortness of breath.  BP Readings from Last 3 Encounters:  12/02/16 140/68  08/31/16 (!) 146/70  05/07/16 130/80   Hypothyroid- Maintained on synthroid.  Lab Results  Component Value Date   TSH 0.80 05/07/2016    Wt Readings from Last 3 Encounters:  12/02/16 121 lb 6.4 oz (55.1 kg)  08/31/16 123 lb 3.2 oz (55.9 kg)  05/07/16 121 lb (54.9 kg)      Review of Systems See HPI  Past Medical History:  Diagnosis Date  . Allergy   . Anemia    iron deficiency  . Cancer ([place]) 1995   vaginal  . Cataract   . Colon polyps   . Depression   . Hyperlipidemia   . Hypertension   . Osteoporosis 01/01/2015  . Rheumatoid arteritis   . Thyroid disease   . TIA (transient ischemic attack) 2016  . Urinary incontinence      Social History   Social History  . Marital status: Divorced    Spouse name: N/A  . Number of children: 8  . Years of education: N/A   Occupational History  .  Retired    Retired from [employer]  . Smoking status: Current Some Day Smoker    Years: 32.00    Types: Cigarettes  . Smokeless tobacco: Never Used  . Alcohol use No  . Drug use: No  . Sexual activity: Not on file   Other Topics Concern  . Not on file   Social History Narrative  . No narrative on file    Past Surgical History:  Procedure Laterality Date  . ABDOMINAL HYSTERECTOMY  1976  . CATARACT EXTRACTION Bilateral   . 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 Hypertension Father   . Heart disease Sister 18    CABG x 3  . Cirrhosis Brother   . Alcohol abuse Brother   . Colitis Daughter   . Colon polyps Daughter   . Hypertension Son   . Kidney disease Son       transplant due to kidney problems after [facility]  . Arthritis Other   . Coronary artery disease Other   . Hyperlipidemia Other   . Hypertension Other   . Stroke Sister     died 10  . Arthritis Sister   . Hypertension Sister   . Thyroid disease Daughter     x 3  . Cirrhosis Brother 59    ETOH abuse  . Colon cancer Father   . Ulcerative colitis Mother   . Parkinson's disease Mother     Allergies  Allergen Reactions  . Amlodipine Other (See Comments)    dizziness  . Iron Nausea And Vomiting    Can take infusions     Current Outpatient Prescriptions on File Prior to Visit  Medication Sig Dispense Refill  . aspirin 325 MG tablet Take 1 tablet (325 mg total) by mouth daily.    Marland Kitchen atorvastatin (LIPITOR) 40 MG tablet Take 1 tablet (40 mg total) by mouth daily. 30 tablet 5  . bisacodyl (DULCOLAX) 10 MG suppository  Place 1 suppository (10 mg total) rectally daily as needed for moderate constipation (if Miralax ineffective). 12 suppository 0  . fluticasone (FLONASE) 50 MCG/ACT nasal spray Place 2 sprays into both nostrils daily. 16 g 5  . folic acid (FOLVITE) 1 MG tablet Take 1 mg by mouth daily.     Marland Kitchen leflunomide (ARAVA) 10 MG tablet Take 10 mg by mouth daily.  3  . levothyroxine (SYNTHROID, LEVOTHROID) 75 MCG tablet Take 1 tablet (75 mcg total) by mouth daily. 90 tablet 1  . loratadine (CLARITIN) 10 MG tablet Take 10 mg by mouth daily as needed. Reported on 10/23/2015    . metoprolol succinate (TOPROL-XL) 50 MG 24 hr tablet TAKE 1 TABLET BY MOUTH EVERY DAY WITH OR IMMEDIATELY FOLLOWING A MEAL 90 tablet 1  . omeprazole (PRILOSEC) 40 MG capsule TAKE ONE CAPSULE BY MOUTH EVERY DAY (Patient taking differently: TAKE ONE CAPSULE BY MOUTH AS NEEDED) 30 capsule 5  . ondansetron (ZOFRAN ODT) 4 MG disintegrating tablet Take 1 tab, dissolve on the tongue every 6 hours as needed for nausea. 40 tablet 2  . predniSONE (DELTASONE) 5 MG tablet Take 5 mg by mouth 2 (two) times daily.     Marland Kitchen  sulfaSALAzine (AZULFIDINE) 500 MG tablet Take 1,000 mg by mouth 2 (two) times daily.     No current facility-administered medications on file prior to visit.     BP 140/68   Pulse (!) 57   Temp 98.4 F (36.9 C) (Oral)   Resp 16   Ht 5\' 7"  (1.702 m)   Wt 121 lb 6.4 oz (55.1 kg)   SpO2 100% Comment: room air  BMI 19.01 kg/m       Objective:   Physical Exam  Constitutional: She is oriented to person, place, and time. She appears well-developed and well-nourished.  HENT:  Head: Normocephalic and atraumatic.  Cardiovascular: Normal rate, regular rhythm and normal heart sounds.   No murmur heard. Pulmonary/Chest: Effort normal and breath sounds normal. No respiratory distress. She has no wheezes.  Musculoskeletal: She exhibits no edema.  Neurological: She is alert and oriented to person, place, and time.  Psychiatric: She has a normal mood and affect. Her behavior is normal. Judgment and thought content normal.          Assessment & Plan:

## 2016-12-02 NOTE — Assessment & Plan Note (Signed)
Maintained on synthroid.  Weight stable. Obtain follow up TSH.

## 2016-12-02 NOTE — Telephone Encounter (Signed)
Labs show synthroid needs adjustment. Increase synthroid from 62mcg to 65mcg. Repeat tsh in 6 weeks. Dx hypothyroid.

## 2016-12-02 NOTE — Assessment & Plan Note (Signed)
BP stable on current meds. Continue same,obtain follow up bmet.  

## 2016-12-02 NOTE — Patient Instructions (Signed)
Please complete lab work prior to leaving.   

## 2016-12-04 MED ORDER — LEVOTHYROXINE SODIUM 88 MCG PO TABS: 88.0000 ug | ORAL_TABLET | Freq: Every day | ORAL | 3 refills | Status: DC

## 2016-12-04 NOTE — Telephone Encounter (Signed)
Notified pt and she voices understanding. Rx sent to pharmacy and lab appt scheduled for 01/15/17 at 11am. Future order entered.

## 2016-12-17 DIAGNOSIS — M1712 Unilateral primary osteoarthritis, left knee: Secondary | ICD-10-CM | POA: Diagnosis not present

## 2016-12-17 DIAGNOSIS — M170 Bilateral primary osteoarthritis of knee: Secondary | ICD-10-CM | POA: Diagnosis not present

## 2016-12-17 DIAGNOSIS — M1711 Unilateral primary osteoarthritis, right knee: Secondary | ICD-10-CM | POA: Diagnosis not present

## 2016-12-24 DIAGNOSIS — M170 Bilateral primary osteoarthritis of knee: Secondary | ICD-10-CM | POA: Diagnosis not present

## 2016-12-31 DIAGNOSIS — M170 Bilateral primary osteoarthritis of knee: Secondary | ICD-10-CM | POA: Diagnosis not present

## 2017-01-14 ENCOUNTER — Ambulatory Visit (INDEPENDENT_AMBULATORY_CARE_PROVIDER_SITE_OTHER): Payer: Medicare Other | Admitting: Family Medicine

## 2017-01-14 ENCOUNTER — Telehealth: Payer: Self-pay | Admitting: Family

## 2017-01-14 ENCOUNTER — Other Ambulatory Visit: Payer: Medicare Other

## 2017-01-14 VITALS — BP 122/82 | HR 66 | Temp 98.1°F | Ht 67.0 in | Wt 119.6 lb

## 2017-01-14 DIAGNOSIS — R319 Hematuria, unspecified: Secondary | ICD-10-CM | POA: Diagnosis not present

## 2017-01-14 DIAGNOSIS — E039 Hypothyroidism, unspecified: Secondary | ICD-10-CM

## 2017-01-14 DIAGNOSIS — R350 Frequency of micturition: Secondary | ICD-10-CM

## 2017-01-14 DIAGNOSIS — R310 Gross hematuria: Secondary | ICD-10-CM | POA: Diagnosis not present

## 2017-01-14 LAB — TSH: TSH: 0.06 u[IU]/mL — ABNORMAL LOW (ref 0.35–4.50)

## 2017-01-14 LAB — COMPREHENSIVE METABOLIC PANEL
ALT: 8 U/L (ref 0–35)
AST: 14 U/L (ref 0–37)
Albumin: 3.8 g/dL (ref 3.5–5.2)
Alkaline Phosphatase: 57 U/L (ref 39–117)
BUN: 18 mg/dL (ref 6–23)
CO2: 28 mEq/L (ref 19–32)
Calcium: 9.6 mg/dL (ref 8.4–10.5)
Chloride: 108 mEq/L (ref 96–112)
Creatinine, Ser: 0.78 mg/dL (ref 0.40–1.20)
GFR: 92.39 mL/min (ref >60.00)
Glucose, Bld: 114 mg/dL — ABNORMAL HIGH (ref 70–99)
Potassium: 3.7 mEq/L (ref 3.5–5.1)
Sodium: 142 mEq/L (ref 135–145)
Total Bilirubin: 0.6 mg/dL (ref 0.2–1.2)
Total Protein: 7.4 g/dL (ref 6.0–8.3)

## 2017-01-14 LAB — CBC
HCT: 38.6 % (ref 36.0–46.0)
Hemoglobin: 12.4 g/dL (ref 12.0–15.0)
MCHC: 32.2 g/dL (ref 30.0–36.0)
MCV: 91.8 fl (ref 78.0–100.0)
Platelets: 327 10*3/uL (ref 150.0–400.0)
RBC: 4.21 Mil/uL (ref 3.87–5.11)
RDW: 14.8 % (ref 11.5–15.5)
WBC: 10.6 10*3/uL — ABNORMAL HIGH (ref 4.0–10.5)

## 2017-01-14 LAB — POC URINALSYSI DIPSTICK (AUTOMATED)
Bilirubin, UA: NEGATIVE
Glucose, UA: NEGATIVE
Ketones, UA: NEGATIVE
Nitrite, UA: NEGATIVE
Spec Grav, UA: 1.02
Urobilinogen, UA: NEGATIVE
pH, UA: 6

## 2017-01-14 LAB — URINALYSIS, MICROSCOPIC ONLY

## 2017-01-14 MED ORDER — CEPHALEXIN 500 MG PO CAPS: 500.0000 mg | ORAL_CAPSULE | Freq: Two times a day (BID) | ORAL | 0 refills | Status: DC

## 2017-01-14 NOTE — Telephone Encounter (Signed)
Patient Name: RAMIYA DELAHUNTY  DOB: Jun 28, 1941    Initial Comment Caller states c/o blood in urine and fatigue.   Nurse Assessment  Nurse: Raphael Gibney, RN, Vanita Ingles Date/Time (Eastern Time): 01/14/2017 9:25:57 AM  Confirm and document reason for call. If symptomatic, describe symptoms. ---Caller states she has blood in her urine and fatigue. No pain with urination. Has urinary urgency. No fever. Has slight back pain.  Does the patient have any new or worsening symptoms? ---Yes  Will a triage be completed? ---Yes  Related visit to physician within the last 2 weeks? ---No  Does the PT have any chronic conditions? (i.e. diabetes, asthma, etc.) ---Yes  List chronic conditions. ---RA; thyroid  Is this a behavioral health or substance abuse call? ---No     Guidelines    Guideline Title Affirmed Question Affirmed Notes  Urine - Blood In Side (flank) or back pain present    Final Disposition User   See Physician within 24 Hours Stringer, RN, Vera    Comments  Pt states she has appt tomorrow at 11 am. Does not want to make appt today as Lenna Sciara is not available.   Referrals  REFERRED TO PCP OFFICE   Disagree/Comply: Disagree  Disagree/Comply Reason: Disagree with instructions

## 2017-01-14 NOTE — Progress Notes (Signed)
Pre visit review using our clinic review tool, if applicable. No additional management support is needed unless otherwise documented below in the visit note. 

## 2017-01-14 NOTE — Patient Instructions (Signed)
Please start on the keflex antibiotic today for possible UTI I am going to send your urine for a culture to determine if you have any infection We will also check your kidney function and your blood counts today If you are having any acute pain or other concerns please contact me Otherwise I will be in touch with your urine culture and we will paln the next step

## 2017-01-14 NOTE — Progress Notes (Addendum)
[facility] at [facility] [address] [place], [place] [address] [phone]  Date:  01/14/2017   Name:  Nancy [name]   DOB:  11/26/1940   MRN:  [id number]  PCP:  [name]., NP    Chief Complaint: Hematuria (c/o blood in urine x 1 day. also having increased urination x 1 month. )   History of Present Illness:  Nancy [name] is a 76 y.o. very pleasant female patient who presents with the following:  Usually a patient of Melissa.  She has noted some urinary frequency for about one month.  She has not really noted any dysuria however.   She came in today because she noted bright red blood in her urine yesterday evening She has noted bright red urine and maybe a tiny bit of a blood clot.   No pain really and is passing her urine ok  She has RA and has aches and pains at baseline She has not noted any belly pain, no fever noted but she did not check her temperature No vomiting She has noted some mild back pain but nothing unusual for her  She has not noted any other bleeding such as blood in her stool, nose bleeds or easy bruising   She will take an aspirin 325 some days when she remembers, but no other blood thinners.    She is on prednisone every day for her RA She IS a smoker- has smoked for over 30 years. She may smoke 6- 7 cigs a day. She has always smoked at this level and has not really been a heavier smoker long term at any time  Never been dx with a kidney stone Never had hematruia in the past as far as she knows   Patient Active Problem List   Diagnosis Date Noted  . Faintness 12/09/2015  . Loss of weight 10/15/2015  . Anemia, iron deficiency 09/30/2015  . Hyperglycemia 06/07/2015  . Near syncope 05/15/2015  . Osteoporosis 01/01/2015  . Expressive aphasia 12/16/2014  . TIA (transient ischemic attack) 12/16/2014  . Pain in joint, ankle and foot 09/06/2014  . Constipation 09/06/2014  . Rheumatoid  arthritis ([place]) 07/25/2014  . Sciatica 01/31/2014  . Hypothyroidism 01/12/2012  . Back pain 01/11/2012  . Edema 06/15/2011  . ANEMIA, B12 DEFICIENCY 11/19/2010  . ANXIETY 10/14/2010  . 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 Hyperlipidemia 12/17/2009  . Iron deficiency anemia 12/17/2009  . TOBACCO ABUSE 12/17/2009  . Depression 12/17/2009  . Essential hypertension 12/17/2009  . ALLERGIC RHINITIS 12/17/2009  . ARTHRITIS, RHEUMATOID 12/17/2009  . OSTEOPOROSIS 12/17/2009  . URINARY INCONTINENCE 12/17/2009    Past Medical History:  Diagnosis Date  . Allergy   . Anemia    iron deficiency  . 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 Cataract   . Colon polyps   . Depression   . Hyperlipidemia   . Hypertension   . Osteoporosis 01/01/2015  . Rheumatoid arteritis   . Thyroid disease   . TIA (transient ischemic attack) 2016  . Urinary incontinence     Past Surgical History:  Procedure Laterality Date  . ABDOMINAL HYSTERECTOMY  1976  . CATARACT EXTRACTION Bilateral   . TUMOR REMOVAL  last was 1976   left ankle x3    Social History  Substance Use Topics  . Smoking status: Current Some Day Smoker    Years: 32.00    Types: Cigarettes  . Smokeless tobacco: Never  Used  . Alcohol use No    Family History  Problem Relation Age of Onset  . Hypertension Father   . Heart disease Sister 26    CABG x 3  . Cirrhosis Brother   . Alcohol abuse Brother   . Colitis Daughter   . Colon polyps Daughter   . Hypertension Son   . Kidney disease Son     transplant due to kidney problems after [facility]  . Arthritis Other   . Coronary artery disease Other   . Hyperlipidemia Other   . Hypertension Other   . Stroke Sister     died 56  . Arthritis Sister   . Hypertension Sister   . Thyroid disease Daughter     x 3  . Cirrhosis Brother 68    ETOH abuse  . Colon cancer Father   . Ulcerative colitis Mother   . Parkinson's disease Mother     Allergies  Allergen Reactions  . Amlodipine Other (See  Comments)    dizziness  . Iron Nausea And Vomiting    Can take infusions     Medication list has been reviewed and updated.  Current Outpatient Prescriptions on File Prior to Visit  Medication Sig Dispense Refill  . aspirin 325 MG tablet Take 1 tablet (325 mg total) by mouth daily.    Marland Kitchen atorvastatin (LIPITOR) 40 MG tablet Take 1 tablet (40 mg total) by mouth daily. 30 tablet 5  . bisacodyl (DULCOLAX) 10 MG suppository Place 1 suppository (10 mg total) rectally daily as needed for moderate constipation (if Miralax ineffective). 12 suppository 0  . fluticasone (FLONASE) 50 MCG/ACT nasal spray Place 2 sprays into both nostrils daily. 16 g 5  . folic acid (FOLVITE) 1 MG tablet Take 1 mg by mouth daily.     Marland Kitchen leflunomide (ARAVA) 10 MG tablet Take 10 mg by mouth daily.  3  . levothyroxine (SYNTHROID, LEVOTHROID) 88 MCG tablet Take 1 tablet (88 mcg total) by mouth daily. 30 tablet 3  . loratadine (CLARITIN) 10 MG tablet Take 10 mg by mouth daily as needed. Reported on 10/23/2015    . metoprolol succinate (TOPROL-XL) 50 MG 24 hr tablet TAKE 1 TABLET BY MOUTH EVERY DAY WITH OR IMMEDIATELY FOLLOWING A MEAL 90 tablet 1  . omeprazole (PRILOSEC) 40 MG capsule TAKE ONE CAPSULE BY MOUTH EVERY DAY (Patient taking differently: TAKE ONE CAPSULE BY MOUTH AS NEEDED) 30 capsule 5  . predniSONE (DELTASONE) 5 MG tablet Take 5 mg by mouth 2 (two) times daily.     Marland Kitchen sulfaSALAzine (AZULFIDINE) 500 MG tablet Take 1,000 mg by mouth 2 (two) times daily.     No current facility-administered medications on file prior to visit.     Review of Systems:  As per HPI- otherwise negative.   Physical Examination: Vitals:   01/14/17 1117  BP: 122/82  Pulse: 66  Temp: 98.1 F (36.7 C)   Vitals:   01/14/17 1117  Weight: 119 lb 9.6 oz (54.3 kg)  Height: 5\' 7"  (1.702 m)   Body mass index is 18.73 kg/m. Ideal Body Weight: Weight in (lb) to have BMI = 25: 159.3 GEN: WDWN, NAD, Non-toxic, A & O x 3, thin build,  looks well HEENT: Atraumatic, Normocephalic. Neck supple. No masses, No LAD. Ears and Nose: No external deformity. CV: RRR, No M/G/R. No JVD. No thrill. No extra heart sounds. PULM: CTA B, no wheezes, crackles, rhonchi. No retractions. No resp. distress. No accessory muscle use. ABD:  S, NT, ND, +BS. No rebound. No HSM.  Benign belly, no CVA tenderness  EXTR: No c/c/e- stigmata of RA in her hands NEURO slow gait, she has knee pain PSYCH: Normally interactive. Conversant. Not depressed or anxious appearing.  Calm demeanor.   Results for orders placed or performed in visit on 01/14/17  Urine Microscopic Only  Result Value Ref Range   WBC, UA 0-2/hpf 0-2/hpf   RBC / HPF TNTC(>50/hpf) (A) 0-2/hpf  Comprehensive metabolic panel  Result Value Ref Range   Sodium 142 135 - 145 mEq/L   Potassium 3.7 3.5 - 5.1 mEq/L   Chloride 108 96 - 112 mEq/L   CO2 28 19 - 32 mEq/L   Glucose, Bld 114 (H) 70 - 99 mg/dL   BUN 18 6 - 23 mg/dL   Creatinine, Ser 0.78 0.40 - 1.20 mg/dL   Total Bilirubin 0.6 0.2 - 1.2 mg/dL   Alkaline Phosphatase 57 39 - 117 U/L   AST 14 0 - 37 U/L   ALT 8 0 - 35 U/L   Total Protein 7.4 6.0 - 8.3 g/dL   Albumin 3.8 3.5 - 5.2 g/dL   Calcium 9.6 8.4 - 10.5 mg/dL   GFR 92.39 >60.00 mL/min  CBC  Result Value Ref Range   WBC 10.6 (H) 4.0 - 10.5 K/uL   RBC 4.21 3.87 - 5.11 Mil/uL   Platelets 327.0 150.0 - 400.0 K/uL   Hemoglobin 12.4 12.0 - 15.0 g/dL   HCT 38.6 36.0 - 46.0 %   MCV 91.8 78.0 - 100.0 fl   MCHC 32.2 30.0 - 36.0 g/dL   RDW 14.8 11.5 - 15.5 %  TSH  Result Value Ref Range   TSH 0.06 (L) 0.35 - 4.50 uIU/mL  POCT Urinalysis Dipstick (Automated)  Result Value Ref Range   Color, UA red    Clarity, UA dark    Glucose, UA negative    Bilirubin, UA negative    Ketones, UA negative    Spec Grav, UA 1.020    Blood, UA 2+    pH, UA 6.0    Protein, UA 2+    Urobilinogen, UA negative    Nitrite, UA negative    Leukocytes, UA small (1+) (A) Negative      Assessment and Plan: Hematuria, unspecified type - Plan: POCT Urinalysis Dipstick (Automated), Urine Culture, Urine Microscopic Only, Comprehensive metabolic panel, CBC, cephALEXin (KEFLEX) 500 MG capsule  Increased urinary frequency - Plan: POCT Urinalysis Dipstick (Automated), Urine Culture  Meds ordered this encounter  Medications  . cephALEXin (KEFLEX) 500 MG capsule    Sig: Take 1 capsule (500 mg total) by mouth 2 (two) times daily.    Dispense:  14 capsule    Refill:  0    Here today with gross hematuria. Infectious vs other cause.  Of note she is a smoker culture pending, CBC and CMP Keflex Await results, likely will need urology referral  Pt advised to seek emergency care if she has serious belly pain or cannot pass her urine  Signed Lamar Blinks, MD  Called her on 3/11- urine culture negative. She reports that she is feeling much better and hematuria is resolved.  Advised her that we need to look for another cause of hematuria- will refer to urology.  She states understanding and will let me know if she does not get her urology appt

## 2017-01-14 NOTE — Telephone Encounter (Signed)
Noted. Message routed to PCP for FYI.

## 2017-01-15 ENCOUNTER — Other Ambulatory Visit: Payer: Medicare Other

## 2017-01-15 ENCOUNTER — Telehealth: Payer: Self-pay | Admitting: Family

## 2017-01-15 DIAGNOSIS — E039 Hypothyroidism, unspecified: Secondary | ICD-10-CM

## 2017-01-15 LAB — URINE CULTURE: Organism ID, Bacteria: NO GROWTH

## 2017-01-15 MED ORDER — LEVOTHYROXINE SODIUM 125 MCG PO TABS: ORAL_TABLET | ORAL | 2 refills | Status: DC

## 2017-01-15 NOTE — Telephone Encounter (Signed)
Labs show we need to decrease synthroid. I sent rx for 125 mcg tabs, but I want her to cut in half and only take 1/2 tab once daily. Repeat TSH in 6 weeks. Dx hypothryoid.

## 2017-01-15 NOTE — Telephone Encounter (Signed)
Notified pt and she voices understanding. Lab appt scheduled for 02/26/17 and future order entered.

## 2017-01-17 NOTE — Addendum Note (Signed)
Addended by: Lamar Blinks C on: 01/17/2017 03:04 PM   Modules accepted: Orders

## 2017-01-21 DIAGNOSIS — R310 Gross hematuria: Secondary | ICD-10-CM | POA: Diagnosis not present

## 2017-01-21 DIAGNOSIS — R350 Frequency of micturition: Secondary | ICD-10-CM | POA: Diagnosis not present

## 2017-02-03 ENCOUNTER — Other Ambulatory Visit: Payer: Self-pay | Admitting: Family

## 2017-02-07 DIAGNOSIS — I639 Cerebral infarction, unspecified: Secondary | ICD-10-CM

## 2017-02-07 HISTORY — DX: Cerebral infarction, unspecified: I63.9

## 2017-02-08 DIAGNOSIS — R310 Gross hematuria: Secondary | ICD-10-CM | POA: Diagnosis not present

## 2017-02-09 DIAGNOSIS — M0579 Rheumatoid arthritis with rheumatoid factor of multiple sites without organ or systems involvement: Secondary | ICD-10-CM | POA: Diagnosis not present

## 2017-02-09 DIAGNOSIS — M79641 Pain in right hand: Secondary | ICD-10-CM | POA: Diagnosis not present

## 2017-02-09 DIAGNOSIS — M2550 Pain in unspecified joint: Secondary | ICD-10-CM | POA: Diagnosis not present

## 2017-02-09 DIAGNOSIS — M79642 Pain in left hand: Secondary | ICD-10-CM | POA: Diagnosis not present

## 2017-02-09 DIAGNOSIS — R5383 Other fatigue: Secondary | ICD-10-CM | POA: Diagnosis not present

## 2017-02-09 DIAGNOSIS — M170 Bilateral primary osteoarthritis of knee: Secondary | ICD-10-CM | POA: Diagnosis not present

## 2017-02-09 DIAGNOSIS — Z6820 Body mass index (BMI) 20.0-20.9, adult: Secondary | ICD-10-CM | POA: Diagnosis not present

## 2017-02-22 DIAGNOSIS — M069 Rheumatoid arthritis, unspecified: Secondary | ICD-10-CM | POA: Diagnosis not present

## 2017-02-22 DIAGNOSIS — M659 Synovitis and tenosynovitis, unspecified: Secondary | ICD-10-CM | POA: Diagnosis not present

## 2017-02-26 ENCOUNTER — Other Ambulatory Visit: Payer: Medicare Other

## 2017-03-02 ENCOUNTER — Other Ambulatory Visit (INDEPENDENT_AMBULATORY_CARE_PROVIDER_SITE_OTHER): Payer: Medicare Other

## 2017-03-02 DIAGNOSIS — E039 Hypothyroidism, unspecified: Secondary | ICD-10-CM | POA: Diagnosis not present

## 2017-03-02 LAB — TSH: TSH: 1.06 u[IU]/mL (ref 0.35–4.50)

## 2017-03-03 DIAGNOSIS — I63511 Cerebral infarction due to unspecified occlusion or stenosis of right middle cerebral artery: Secondary | ICD-10-CM | POA: Insufficient documentation

## 2017-03-03 DIAGNOSIS — I351 Nonrheumatic aortic (valve) insufficiency: Secondary | ICD-10-CM | POA: Diagnosis not present

## 2017-03-03 DIAGNOSIS — R29810 Facial weakness: Secondary | ICD-10-CM | POA: Diagnosis not present

## 2017-03-03 DIAGNOSIS — I10 Essential (primary) hypertension: Secondary | ICD-10-CM | POA: Diagnosis not present

## 2017-03-03 DIAGNOSIS — G459 Transient cerebral ischemic attack, unspecified: Secondary | ICD-10-CM | POA: Diagnosis not present

## 2017-03-03 DIAGNOSIS — I63411 Cerebral infarction due to embolism of right middle cerebral artery: Secondary | ICD-10-CM | POA: Diagnosis not present

## 2017-03-03 DIAGNOSIS — R4701 Aphasia: Secondary | ICD-10-CM | POA: Diagnosis not present

## 2017-03-03 DIAGNOSIS — G8194 Hemiplegia, unspecified affecting left nondominant side: Secondary | ICD-10-CM | POA: Diagnosis not present

## 2017-03-03 DIAGNOSIS — I639 Cerebral infarction, unspecified: Secondary | ICD-10-CM | POA: Diagnosis not present

## 2017-03-03 DIAGNOSIS — R531 Weakness: Secondary | ICD-10-CM | POA: Diagnosis not present

## 2017-03-03 DIAGNOSIS — M069 Rheumatoid arthritis, unspecified: Secondary | ICD-10-CM | POA: Diagnosis not present

## 2017-03-03 DIAGNOSIS — R29702 NIHSS score 2: Secondary | ICD-10-CM | POA: Diagnosis not present

## 2017-03-03 DIAGNOSIS — E039 Hypothyroidism, unspecified: Secondary | ICD-10-CM | POA: Diagnosis not present

## 2017-03-03 DIAGNOSIS — E876 Hypokalemia: Secondary | ICD-10-CM | POA: Diagnosis not present

## 2017-03-03 DIAGNOSIS — I6523 Occlusion and stenosis of bilateral carotid arteries: Secondary | ICD-10-CM | POA: Diagnosis not present

## 2017-03-08 ENCOUNTER — Telehealth: Payer: Self-pay | Admitting: Behavioral Health

## 2017-03-08 ENCOUNTER — Telehealth: Payer: Self-pay | Admitting: *Deleted

## 2017-03-08 ENCOUNTER — Encounter: Payer: Self-pay | Admitting: Behavioral Health

## 2017-03-08 ENCOUNTER — Encounter: Payer: Self-pay | Admitting: Family

## 2017-03-08 DIAGNOSIS — I639 Cerebral infarction, unspecified: Secondary | ICD-10-CM | POA: Diagnosis not present

## 2017-03-08 DIAGNOSIS — I63511 Cerebral infarction due to unspecified occlusion or stenosis of right middle cerebral artery: Secondary | ICD-10-CM | POA: Diagnosis not present

## 2017-03-08 DIAGNOSIS — R269 Unspecified abnormalities of gait and mobility: Secondary | ICD-10-CM | POA: Diagnosis not present

## 2017-03-08 NOTE — Telephone Encounter (Signed)
error:[id number] ° °

## 2017-03-08 NOTE — Telephone Encounter (Signed)
Let's get her in for TCM hospital follow up visit please.

## 2017-03-08 NOTE — Telephone Encounter (Signed)
TCM/Hospital Follow-up visit scheduled for 03/16/17 at 10:30 AM. Please see telephone note 03/08/17.

## 2017-03-08 NOTE — Telephone Encounter (Signed)
Received call from [place], [place] with [facility] stating they were contacted when pt was discharged from the hospital to evaluate for home health services following hospitalization for a stroke. Needs supervising MD name as PA or NP cannot sign their orders. Provided MD, Charlett Blake. No information available in care everywhere yet.

## 2017-03-08 NOTE — Telephone Encounter (Signed)
Transition Care Management Follow-up Telephone Call   Date discharged? 03/06/17   How have you been since you were released from the hospital? Patient stated, "I'm feeling pretty good & doing well".   Do you understand why you were in the hospital? yes, patient voiced that she had stroke-like symptoms.   Do you understand the discharge instructions? yes   Where were you discharged to? Home with daughter.   Items Reviewed:  Medications reviewed: yes  Allergies reviewed: yes  Dietary changes reviewed: yes, low sodium diet.  Referrals reviewed: yes, follow-up with PCP.   Functional Questionnaire:   Activities of Daily Living (ADLs):   She states they are independent in the following: ambulation, bathing and hygiene, feeding, continence, grooming, toileting and dressing States they require assistance with the following: None   Any transportation issues/concerns?: no   Any patient concerns? no   Confirmed importance and date/time of follow-up visits scheduled yes, 03/16/17 at 10:30 AM  Provider Appointment booked with Debbrah Alar, NP.  Confirmed with patient if condition begins to worsen call PCP or go to the ER.  Patient was given the office number and encouraged to call back with question or concerns.  : yes

## 2017-03-09 DIAGNOSIS — R269 Unspecified abnormalities of gait and mobility: Secondary | ICD-10-CM | POA: Diagnosis not present

## 2017-03-09 DIAGNOSIS — I63511 Cerebral infarction due to unspecified occlusion or stenosis of right middle cerebral artery: Secondary | ICD-10-CM | POA: Diagnosis not present

## 2017-03-09 DIAGNOSIS — I639 Cerebral infarction, unspecified: Secondary | ICD-10-CM | POA: Diagnosis not present

## 2017-03-10 ENCOUNTER — Telehealth: Payer: Self-pay | Admitting: Family

## 2017-03-10 DIAGNOSIS — M2550 Pain in unspecified joint: Secondary | ICD-10-CM | POA: Diagnosis not present

## 2017-03-10 DIAGNOSIS — I639 Cerebral infarction, unspecified: Secondary | ICD-10-CM | POA: Diagnosis not present

## 2017-03-10 DIAGNOSIS — R269 Unspecified abnormalities of gait and mobility: Secondary | ICD-10-CM | POA: Diagnosis not present

## 2017-03-10 DIAGNOSIS — M170 Bilateral primary osteoarthritis of knee: Secondary | ICD-10-CM | POA: Diagnosis not present

## 2017-03-10 DIAGNOSIS — M0579 Rheumatoid arthritis with rheumatoid factor of multiple sites without organ or systems involvement: Secondary | ICD-10-CM | POA: Diagnosis not present

## 2017-03-10 DIAGNOSIS — I63511 Cerebral infarction due to unspecified occlusion or stenosis of right middle cerebral artery: Secondary | ICD-10-CM | POA: Diagnosis not present

## 2017-03-10 NOTE — Telephone Encounter (Signed)
Caller name: Timmothy Sours Relationship to patient: [facility] Can be reached: [phone] Pharmacy:  Reason for call: Request orders for OT Evaluation

## 2017-03-10 NOTE — Telephone Encounter (Signed)
OK 

## 2017-03-10 NOTE — Telephone Encounter (Signed)
Ok

## 2017-03-10 NOTE — Telephone Encounter (Signed)
Caller name: Inez Catalina  Relation to pt: RN from [facility]  Call back [place]:  Reason for call:  Verbal orders for home health orders, please advise

## 2017-03-11 ENCOUNTER — Telehealth: Payer: Self-pay | Admitting: Family

## 2017-03-11 NOTE — Telephone Encounter (Signed)
Don with [facility] informed, understood & agreed/SLS 05/03

## 2017-03-11 NOTE — Telephone Encounter (Signed)
PT with [facility] calling requesting verbal order for PT, 2x this week and 1x a week for 2 weeks, please call Sreejesh with verbal [phone]

## 2017-03-11 NOTE — Telephone Encounter (Signed)
Nancy [name] with [facility] informed, understood & agreed/SLS 05/03

## 2017-03-11 NOTE — Telephone Encounter (Signed)
Caller name: Timmothy Sours  Relation to pt: OT from [facility] Call back number: [phone]    Reason for caller Name:  OT evaluation, no further need, please advise

## 2017-03-12 DIAGNOSIS — R269 Unspecified abnormalities of gait and mobility: Secondary | ICD-10-CM | POA: Diagnosis not present

## 2017-03-12 DIAGNOSIS — I639 Cerebral infarction, unspecified: Secondary | ICD-10-CM | POA: Diagnosis not present

## 2017-03-12 DIAGNOSIS — I63511 Cerebral infarction due to unspecified occlusion or stenosis of right middle cerebral artery: Secondary | ICD-10-CM | POA: Diagnosis not present

## 2017-03-15 NOTE — Telephone Encounter (Signed)
Noted  

## 2017-03-16 ENCOUNTER — Encounter: Payer: Self-pay | Admitting: Family

## 2017-03-16 ENCOUNTER — Ambulatory Visit (INDEPENDENT_AMBULATORY_CARE_PROVIDER_SITE_OTHER): Payer: Medicare Other | Admitting: Family

## 2017-03-16 VITALS — BP 138/74 | HR 70 | Temp 98.0°F | Resp 18 | Ht 67.0 in | Wt 122.8 lb

## 2017-03-16 DIAGNOSIS — I69354 Hemiplegia and hemiparesis following cerebral infarction affecting left non-dominant side: Secondary | ICD-10-CM

## 2017-03-16 DIAGNOSIS — I10 Essential (primary) hypertension: Secondary | ICD-10-CM | POA: Diagnosis not present

## 2017-03-16 DIAGNOSIS — E785 Hyperlipidemia, unspecified: Secondary | ICD-10-CM | POA: Diagnosis not present

## 2017-03-16 DIAGNOSIS — I639 Cerebral infarction, unspecified: Secondary | ICD-10-CM

## 2017-03-16 DIAGNOSIS — R319 Hematuria, unspecified: Secondary | ICD-10-CM

## 2017-03-16 DIAGNOSIS — Z8673 Personal history of transient ischemic attack (TIA), and cerebral infarction without residual deficits: Secondary | ICD-10-CM

## 2017-03-16 DIAGNOSIS — M059 Rheumatoid arthritis with rheumatoid factor, unspecified: Secondary | ICD-10-CM | POA: Diagnosis not present

## 2017-03-16 DIAGNOSIS — R269 Unspecified abnormalities of gait and mobility: Secondary | ICD-10-CM | POA: Diagnosis not present

## 2017-03-16 DIAGNOSIS — I63511 Cerebral infarction due to unspecified occlusion or stenosis of right middle cerebral artery: Secondary | ICD-10-CM | POA: Diagnosis not present

## 2017-03-16 NOTE — Assessment & Plan Note (Signed)
Continue plavix, PT/OT. Has some residual LUE strength deficit and balance issues but she feels like she is improving overall.

## 2017-03-16 NOTE — Assessment & Plan Note (Signed)
Goal LDL is <70.  LDL in care everywhere from her hospitalization was 134 but she admits to poor compliance. Resume lipitor nightly.  If still above goal next visit will need to increase dose.

## 2017-03-16 NOTE — Progress Notes (Signed)
Subjective:    [name], female    DOB: April 21, 1941, 76 y.o.   MRN: [id number]  HPI  Nancy [name] is a 76 yr old female who presents today for hospital follow up.    CVA- patient was discharged on 03/06/17.  She was at [facility] with her daughter who had a MI.  She had slurred speech. Was immediately was send down to the ER.  Discharge summary is reviewed.  She was admitted with stroke like symptoms.  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  She received TPA.  Neuro was consulted during her visit. She was started on plavix and continued on statin. LDL was 134. Reports that she was sent home with home health PT.  She reports some residual LUE weakness which is improving.  She was taking aspirin "some days" prior to her cva. She was placed on plavix.    Hyperlipidemia- reports that she was not compliant with daily lipitor prior to her CVA.   Hematuria- neg Cystoscopy, net CT- evaluated by urology.   RA- She is now following with Dr. Lenna Gilford. She is maintained on Arava, prednison, methotrexate.    HTN- currently maintained on toprol xl.   BP Readings from Last 3 Encounters:  03/16/17 138/74  01/14/17 122/82  12/02/16 140/68     Review of Systems See HPI  Past Medical History:  Diagnosis Date  . Allergy   . Anemia    iron deficiency  . 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 Cataract   . Colon polyps   . Depression   . Hyperlipidemia   . Hypertension   . Osteoporosis 01/01/2015  . Rheumatoid arteritis   . Thyroid disease   . TIA (transient ischemic attack) 2016  . Urinary incontinence      Social History   Social History  . Marital status: Divorced    Spouse name: N/A  . Number of children: 8  . Years of education: N/A   Occupational History  .  Retired    Retired from [employer]  . Smoking status: Current Some Day Smoker    Years: 32.00    Types: Cigarettes  . Smokeless tobacco: Never Used  . Alcohol use No  .  Drug use: No  . Sexual activity: Not on file   Other Topics Concern  . Not on file   Social History Narrative  . No narrative on file    Past Surgical History:  Procedure Laterality Date  . ABDOMINAL HYSTERECTOMY  1976  . CATARACT EXTRACTION Bilateral   . 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 Hypertension Father   . Colon cancer Father   . Ulcerative colitis Mother   . Parkinson's disease Mother   . Heart disease Sister 27    CABG x 3  . Cirrhosis Brother   . Alcohol abuse Brother   . Colitis Daughter   . Colon polyps Daughter   . Heart attack Daughter   . Hypertension Son   . Kidney disease Son     transplant due to kidney problems after [facility]  . Arthritis Other   . Coronary artery disease Other   . Hyperlipidemia Other   . Hypertension Other   . Stroke Sister     died 53  . Arthritis Sister   . Hypertension Sister   .  Thyroid disease Daughter     x 3  . Cirrhosis Brother 65    ETOH abuse    Allergies  Allergen Reactions  . Amlodipine Other (See Comments)    dizziness  . Iron Nausea And Vomiting    Can take infusions     Current Outpatient Prescriptions on File Prior to Visit  Medication Sig Dispense Refill  . atorvastatin (LIPITOR) 40 MG tablet Take 1 tablet (40 mg total) by mouth daily. 30 tablet 5  . fluticasone (FLONASE) 50 MCG/ACT nasal spray Place 2 sprays into both nostrils daily. 16 g 5  . folic acid (FOLVITE) 1 MG tablet Take 1 mg by mouth daily.     Marland Kitchen leflunomide (ARAVA) 10 MG tablet Take 10 mg by mouth daily.  3  . levothyroxine (SYNTHROID) 125 MCG tablet Take 1/2 tablet by mouth once daily. 30 tablet 2  . loratadine (CLARITIN) 10 MG tablet Take 10 mg by mouth daily as needed. Reported on 10/23/2015    . metoprolol succinate (TOPROL-XL) 50 MG 24 hr tablet TAKE 1 TABLET BY MOUTH EVERY DAY WITH OR IMMEDIATELY FOLLOWING A MEAL 90 tablet 1  . predniSONE (DELTASONE) 5 MG tablet  Take 5 mg by mouth as needed.     No current facility-administered medications on file prior to visit.     BP 138/74   Pulse 70   Temp 98 F (36.7 C) (Oral)   Resp 18   Ht 5\' 7"  (1.702 m)   Wt 122 lb 12.8 oz (55.7 kg)   SpO2 99% Comment: room air  BMI 19.23 kg/m       Objective:   Physical Exam  Constitutional: She appears well-developed and well-nourished.  Cardiovascular: Normal rate, regular rhythm and normal heart sounds.   No murmur heard. Pulmonary/Chest: Effort normal and breath sounds normal. No respiratory distress. She has no wheezes.  Psychiatric: She has a normal mood and affect. Her behavior is normal. Judgment and thought content normal.          Assessment & Plan:

## 2017-03-16 NOTE — Assessment & Plan Note (Signed)
Resolved. Neg work up with urology.

## 2017-03-16 NOTE — Assessment & Plan Note (Signed)
Stable on current meds.  Monitor.

## 2017-03-16 NOTE — Progress Notes (Signed)
Pre visit review using our clinic review tool, if applicable. No additional management support is needed unless otherwise documented below in the visit note. 

## 2017-03-16 NOTE — Assessment & Plan Note (Signed)
Followed by Dr. Lenna Gilford.  Stable.

## 2017-03-17 DIAGNOSIS — I639 Cerebral infarction, unspecified: Secondary | ICD-10-CM | POA: Diagnosis not present

## 2017-03-17 DIAGNOSIS — I63511 Cerebral infarction due to unspecified occlusion or stenosis of right middle cerebral artery: Secondary | ICD-10-CM | POA: Diagnosis not present

## 2017-03-17 DIAGNOSIS — R269 Unspecified abnormalities of gait and mobility: Secondary | ICD-10-CM | POA: Diagnosis not present

## 2017-03-23 DIAGNOSIS — I63511 Cerebral infarction due to unspecified occlusion or stenosis of right middle cerebral artery: Secondary | ICD-10-CM | POA: Diagnosis not present

## 2017-03-23 DIAGNOSIS — R269 Unspecified abnormalities of gait and mobility: Secondary | ICD-10-CM | POA: Diagnosis not present

## 2017-03-23 DIAGNOSIS — I639 Cerebral infarction, unspecified: Secondary | ICD-10-CM | POA: Diagnosis not present

## 2017-03-24 DIAGNOSIS — R269 Unspecified abnormalities of gait and mobility: Secondary | ICD-10-CM | POA: Diagnosis not present

## 2017-03-24 DIAGNOSIS — I639 Cerebral infarction, unspecified: Secondary | ICD-10-CM | POA: Diagnosis not present

## 2017-03-24 DIAGNOSIS — I63511 Cerebral infarction due to unspecified occlusion or stenosis of right middle cerebral artery: Secondary | ICD-10-CM | POA: Diagnosis not present

## 2017-03-31 DIAGNOSIS — M0579 Rheumatoid arthritis with rheumatoid factor of multiple sites without organ or systems involvement: Secondary | ICD-10-CM | POA: Diagnosis not present

## 2017-04-01 ENCOUNTER — Other Ambulatory Visit: Payer: Self-pay | Admitting: Family

## 2017-05-03 ENCOUNTER — Other Ambulatory Visit: Payer: Self-pay | Admitting: Family

## 2017-05-03 NOTE — Telephone Encounter (Signed)
Refill sent per [facility] refill protocol/SLS  

## 2017-05-19 DIAGNOSIS — R002 Palpitations: Secondary | ICD-10-CM | POA: Diagnosis not present

## 2017-05-19 DIAGNOSIS — Z79899 Other long term (current) drug therapy: Secondary | ICD-10-CM | POA: Diagnosis not present

## 2017-05-19 DIAGNOSIS — R197 Diarrhea, unspecified: Secondary | ICD-10-CM | POA: Diagnosis not present

## 2017-06-01 ENCOUNTER — Ambulatory Visit: Payer: Medicare Other | Admitting: Family

## 2017-06-08 DIAGNOSIS — H31092 Other chorioretinal scars, left eye: Secondary | ICD-10-CM | POA: Diagnosis not present

## 2017-06-08 DIAGNOSIS — H40013 Open angle with borderline findings, low risk, bilateral: Secondary | ICD-10-CM | POA: Diagnosis not present

## 2017-06-08 DIAGNOSIS — Z961 Presence of intraocular lens: Secondary | ICD-10-CM | POA: Diagnosis not present

## 2017-06-08 DIAGNOSIS — H40033 Anatomical narrow angle, bilateral: Secondary | ICD-10-CM | POA: Diagnosis not present

## 2017-06-15 DIAGNOSIS — M2550 Pain in unspecified joint: Secondary | ICD-10-CM | POA: Diagnosis not present

## 2017-06-15 DIAGNOSIS — Z6821 Body mass index (BMI) 21.0-21.9, adult: Secondary | ICD-10-CM | POA: Diagnosis not present

## 2017-06-15 DIAGNOSIS — Z79899 Other long term (current) drug therapy: Secondary | ICD-10-CM | POA: Diagnosis not present

## 2017-06-15 DIAGNOSIS — M0579 Rheumatoid arthritis with rheumatoid factor of multiple sites without organ or systems involvement: Secondary | ICD-10-CM | POA: Diagnosis not present

## 2017-06-15 DIAGNOSIS — M170 Bilateral primary osteoarthritis of knee: Secondary | ICD-10-CM | POA: Diagnosis not present

## 2017-06-18 ENCOUNTER — Ambulatory Visit (INDEPENDENT_AMBULATORY_CARE_PROVIDER_SITE_OTHER): Payer: Medicare Other | Admitting: Family

## 2017-06-18 ENCOUNTER — Encounter: Payer: Self-pay | Admitting: Family

## 2017-06-18 VITALS — BP 139/57 | HR 72 | Temp 98.4°F | Resp 16 | Ht 67.0 in | Wt 123.0 lb

## 2017-06-18 DIAGNOSIS — M069 Rheumatoid arthritis, unspecified: Secondary | ICD-10-CM | POA: Diagnosis not present

## 2017-06-18 DIAGNOSIS — F329 Major depressive disorder, single episode, unspecified: Secondary | ICD-10-CM

## 2017-06-18 DIAGNOSIS — F32A Depression, unspecified: Secondary | ICD-10-CM

## 2017-06-18 DIAGNOSIS — E039 Hypothyroidism, unspecified: Secondary | ICD-10-CM

## 2017-06-18 DIAGNOSIS — I10 Essential (primary) hypertension: Secondary | ICD-10-CM

## 2017-06-18 DIAGNOSIS — R413 Other amnesia: Secondary | ICD-10-CM

## 2017-06-18 MED ORDER — ESCITALOPRAM OXALATE 5 MG PO TABS: 5.0000 mg | ORAL_TABLET | Freq: Every day | ORAL | 1 refills | Status: DC

## 2017-06-18 NOTE — Progress Notes (Signed)
Subjective:    [name], female    DOB: 07-12-1941, 76 y.o.   MRN: [id number]  HPI  Nancy [name] is a 76 year old patient who presents today for follow-up.  Hyperlipidemia- she is maintained on Lipitor 40 mg once daily. Reports that she has not taken in about 2 month.   Lab Results  Component Value Date   CHOL 203 (H) 05/07/2016   HDL 48.30 05/07/2016   LDLCALC 127 (H) 05/07/2016   TRIG 138.0 05/07/2016   CHOLHDL 4 05/07/2016   HTN- current blood pressure medication includes Toprol-XL 50 mg by mouth daily. Denies OB. Some pedal edema.  BP Readings from Last 3 Encounters:  06/18/17 (!) 139/57  03/16/17 138/74  01/14/17 122/82   RA- She is followed by rheumatology. She is maintained on prednisone 5 mg as needed, Areva, and methotrexate.  She is working with Dr Lenna Gilford.    Hx of CVA- she is maintained on Plavix.   Hypothyroid- She is maintained on synthroid 125 mcg 1/2 tab once daily.   Reports that the father of her 8 children recently passed. Has had a lot of stress surrounding this. She finds herself more withdrawn and less motivated recently.    She does report some concerns about memory.  Review of Systems See HPI  Past Medical History:  Diagnosis Date  . Allergy   . Anemia    iron deficiency  . 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 Cataract   . Colon polyps   . CVA (cerebral vascular accident) ([place]) 02/2017  . Depression   . Hyperlipidemia   . Hypertension   . Osteoporosis 01/01/2015  . Rheumatoid arteritis   . Thyroid disease   . TIA (transient ischemic attack) 2016  . Urinary incontinence      Social History   Social History  . Marital status: Divorced    Spouse name: N/A  . Number of children: 8  . Years of education: N/A   Occupational History  .  Retired    Retired from [employer]  . Smoking status: Current Some Day Smoker    Years: 32.00    Types: Cigarettes  . Smokeless tobacco: Never Used  .  Alcohol use No  . Drug use: No  . Sexual activity: Not on file   Other Topics Concern  . Not on file   Social History Narrative  . No narrative on file    Past Surgical History:  Procedure Laterality Date  . ABDOMINAL HYSTERECTOMY  1976  . CATARACT EXTRACTION Bilateral   . 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 Hypertension Father   . Colon cancer Father   . Ulcerative colitis Mother   . Parkinson's disease Mother   . Heart disease Sister 75       CABG x 3  . Cirrhosis Brother   . Alcohol abuse Brother   . Colitis Daughter   . Colon polyps Daughter   . Heart attack Daughter   . Hypertension Son   . Kidney disease Son        transplant due to kidney problems after [facility]  . Arthritis Other   . Coronary artery disease Other   . Hyperlipidemia Other   . Hypertension Other   . Stroke Sister        died 45  . Arthritis Sister   .  Hypertension Sister   . Thyroid disease Daughter        x 3  . Cirrhosis Brother 65       ETOH abuse    Allergies  Allergen Reactions  . Amlodipine Other (See Comments)    dizziness  . Iron Nausea And Vomiting    Can take infusions     Current Outpatient Prescriptions on File Prior to Visit  Medication Sig Dispense Refill  . atorvastatin (LIPITOR) 40 MG tablet Take 1 tablet (40 mg total) by mouth daily. 30 tablet 5  . clopidogrel (PLAVIX) 75 MG tablet TAKE 1 TABLET BY MOUTH EVERY DAY 30 tablet 5  . Docusate Calcium (STOOL SOFTENER PO) Take 1 tablet by mouth daily.    . fluticasone (FLONASE) 50 MCG/ACT nasal spray Place 2 sprays into both nostrils daily. 16 g 5  . folic acid (FOLVITE) 1 MG tablet Take 1 mg by mouth daily.     Marland Kitchen leflunomide (ARAVA) 10 MG tablet Take 10 mg by mouth daily.  3  . levothyroxine (SYNTHROID) 125 MCG tablet Take 1/2 tablet by mouth once daily. 30 tablet 2  . loratadine (CLARITIN) 10 MG tablet Take 10 mg by mouth daily as needed. Reported on  10/23/2015    . methotrexate (RHEUMATREX) 2.5 MG tablet Take 20 mg by mouth once a week.   3  . metoprolol succinate (TOPROL-XL) 50 MG 24 hr tablet TAKE 1 TABLET BY MOUTH EVERY DAY WITH OR IMMEDIATELY FOLLOWING A MEAL 90 tablet 1  . predniSONE (DELTASONE) 5 MG tablet Take 5 mg by mouth as needed.     No current facility-administered medications on file prior to visit.     BP (!) 139/57 (BP Location: Right Arm, Cuff Size: Normal)   Pulse 72   Temp 98.4 F (36.9 C) (Oral)   Resp 16   Ht 5\' 7"  (1.702 m)   Wt 123 lb (55.8 kg)   SpO2 99%   BMI 19.26 kg/m       Objective:   Physical Exam  Constitutional: She is oriented to person, place, and time. She appears well-developed and well-nourished.  HENT:  Head: Normocephalic and atraumatic.  Cardiovascular: Normal rate, regular rhythm and normal heart sounds.   No murmur heard. Pulmonary/Chest: Effort normal and breath sounds normal. No respiratory distress. She has no wheezes.  Musculoskeletal: She exhibits no edema.  Swelling noted bilateral hand/finger joints  Neurological: She is alert and oriented to person, place, and time.  Psychiatric: She has a normal mood and affect. Her behavior is normal. Judgment and thought content normal.     Assessment/plan     Hypertension-blood pressure looks stable on current medications continue same.  Hypothyroid-clinically stable on Synthroid obtain TSH  Hyperlipidemia-she is not currently taking statin. I've advised her to start.   Rheumatoid arthritis-continues to have pain and swelling. This is being managed by rheumatology.  Memory loss-plan to perform Mini-Mental status next visit. History of CVA-advised patient to restart statin discussed that this along with ongoing Plavix use will help to decrease chance of subsequent stroke.    depression-patient scored 13 on depression screen. Placing her in a moderate depression category. We discussed addition of the low-dose antidepressant. Will  give her trial of Lexapro 5 mg once daily.  Next visit we'll plan to check her lab work including follow-up lipid panel as well as follow-up on her depression and a Mini-Mental status exam.

## 2017-06-18 NOTE — Patient Instructions (Signed)
Please start Lexapro 5 mg once daily for depression. Follow-up in one month. Call sooner if new or worsening symptoms.

## 2017-07-13 DIAGNOSIS — Z79899 Other long term (current) drug therapy: Secondary | ICD-10-CM | POA: Diagnosis not present

## 2017-07-13 DIAGNOSIS — M0579 Rheumatoid arthritis with rheumatoid factor of multiple sites without organ or systems involvement: Secondary | ICD-10-CM | POA: Diagnosis not present

## 2017-07-15 NOTE — Progress Notes (Signed)
Subjective:   Nancy [name] is a 76 y.o. female who presents for Medicare Annual (Subsequent) preventive examination.  Review of Systems:  No ROS.  Medicare Wellness Visit. Additional risk factors are reflected in the social history.  Cardiac Risk Factors include: advanced age (>65men, >71 women);dyslipidemia;hypertension;smoking/ tobacco exposure Sleep patterns:  Sleeps well most of the time. Home Safety/Smoke Alarms: Feels safe in home. Smoke alarms in place.  Living environment; residence and Firearm Safety: Lives alone on 1st floor.  Seat Belt Safety/Bike Helmet: Wears seat belt.   Female:      Mammo- 2015      Dexa scan-12/30/14       CCS- 10/23/15: no routine recall due to age  Objective:     Vitals: BP 139/71 (BP Location: Right Arm, Patient Position: Sitting, Cuff Size: Normal)   Pulse 66   Ht 5\' 7"  (1.702 m)   Wt 122 lb (55.3 kg)   SpO2 97%   BMI 19.11 kg/m   Body mass index is 19.11 kg/m.   Tobacco History  Smoking Status  . Current Some Day Smoker  . Packs/day: 0.25  . Years: 32.00  . Types: Cigarettes  Smokeless Tobacco  . Never Used     Ready to quit: No Counseling given: Yes   Past Medical History:  Diagnosis Date  . Allergy   . Anemia    iron deficiency  . 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 Cataract   . Colon polyps   . CVA (cerebral vascular accident) ([place]) 02/2017  . Depression   . Hyperlipidemia   . Hypertension   . Osteoporosis 01/01/2015  . Rheumatoid arteritis   . Thyroid disease   . TIA (transient ischemic attack) 2016  . Urinary incontinence    Past Surgical History:  Procedure Laterality Date  . ABDOMINAL HYSTERECTOMY  1976  . CATARACT EXTRACTION Bilateral   . TUMOR REMOVAL  last was 1976   left ankle x3   Family History  Problem Relation Age of Onset  . Hypertension Father   . Colon cancer Father   . Ulcerative colitis Mother   . Parkinson's disease Mother   . Heart disease Sister 47       CABG x 3  . Cirrhosis  Brother   . Alcohol abuse Brother   . Colitis Daughter   . Colon polyps Daughter   . Heart attack Daughter   . Hypertension Son   . Kidney disease Son        transplant due to kidney problems after [facility]  . Arthritis Other   . Coronary artery disease Other   . Hyperlipidemia Other   . Hypertension Other   . Stroke Sister        died 41  . Arthritis Sister   . Hypertension Sister   . Thyroid disease Daughter        x 3  . Cirrhosis Brother 18       ETOH abuse   History  Sexual Activity  . Sexual activity: No    Outpatient Encounter Prescriptions as of 07/19/2017  Medication Sig  . atorvastatin (LIPITOR) 40 MG tablet Take 1 tablet (40 mg total) by mouth daily.  . clopidogrel (PLAVIX) 75 MG tablet TAKE 1 TABLET BY MOUTH EVERY DAY  . Docusate Calcium (STOOL SOFTENER PO) Take 1 tablet by mouth daily.  Marland Kitchen escitalopram (LEXAPRO) 5 MG tablet Take 1 tablet (5 mg total) by mouth daily.  . fluticasone (FLONASE) 50 MCG/ACT nasal  spray Place 2 sprays into both nostrils daily.  . folic acid (FOLVITE) 1 MG tablet Take 1 mg by mouth daily.   Marland Kitchen leflunomide (ARAVA) 10 MG tablet Take 10 mg by mouth daily.  Marland Kitchen levothyroxine (SYNTHROID) 125 MCG tablet Take 1/2 tablet by mouth once daily.  Marland Kitchen loratadine (CLARITIN) 10 MG tablet Take 10 mg by mouth daily as needed. Reported on 10/23/2015  . methotrexate (RHEUMATREX) 2.5 MG tablet Take 20 mg by mouth once a week.   . metoprolol succinate (TOPROL-XL) 50 MG 24 hr tablet TAKE 1 TABLET BY MOUTH EVERY DAY WITH OR IMMEDIATELY FOLLOWING A MEAL  . predniSONE (DELTASONE) 5 MG tablet Take 5 mg by mouth as needed.   No facility-administered encounter medications on file as of 07/19/2017.     Activities of Daily Living In your present state of health, do you have any difficulty performing the following activities: 07/19/2017  Hearing? N  Vision? N  Comment wears reading glasses. Eye doctor yearly.  Difficulty concentrating or making decisions? Y    Walking or climbing stairs? N  Dressing or bathing? N  Doing errands, shopping? N  Preparing Food and eating ? N  Using the Toilet? N  In the past six months, have you accidently leaked urine? N  Do you have problems with loss of bowel control? Y  Managing your Medications? N  Managing your Finances? N  Housekeeping or managing your Housekeeping? N  Some recent data might be hidden    Patient Care Team: Debbrah Alar, NP as PCP - General Phadke, Karsten Ro, MD as Consulting Physician (Endocrinology) [place] Crane, MD as Referring Physician (Rheumatology) Bo Merino, MD as Consulting Physician (Rheumatology)    Assessment:    Physical assessment deferred to PCP.  Exercise Activities and Dietary recommendations Current Exercise Habits: Structured exercise class, Time (Minutes): 60, Frequency (Times/Week): 3, Weekly Exercise (Minutes/Week): 180 Diet (meal preparation, eat out, water intake, caffeinated beverages, dairy products, fruits and vegetables): healthy diet   Goals      Patient Stated   . Begin volunteering (pt-stated)      Fall Risk Fall Risk  07/19/2017 06/18/2017 05/07/2016 11/13/2015 09/30/2015  Falls in the past year? No No No Yes No  Number falls in past yr: - - - 1 -  Injury with Fall? - - - No -   Depression Screen PHQ 2/9 Scores 07/19/2017 06/18/2017 06/18/2017 08/31/2016  PHQ - 2 Score 1 3 1  0  PHQ- 9 Score 4 13 - -     Cognitive Function MMSE - Mini Mental State Exam 07/19/2017  Orientation to time 5  Orientation to Place 5  Registration 3  Attention/ Calculation 5  Recall 2  Language- name 2 objects 2  Language- repeat 0  Language- follow 3 step command 3  Language- read & follow direction 1  Write a sentence 1  Copy design 1  Total score 28        Immunization History  Administered Date(s) Administered  . Influenza Split 10/16/2011  . Influenza Whole 09/10/2009, 08/18/2010  . Influenza, High Dose Seasonal PF 09/10/2015,  08/31/2016  . Influenza,inj,Quad PF,6+ Mos 09/05/2014  . Influenza-Unspecified 08/12/2012, 08/09/2013  . Pneumococcal Conjugate-13 09/05/2014  . Pneumococcal Polysaccharide-23 01/01/2009  . Td 12/26/2014   Screening Tests Health Maintenance  Topic Date Due  . INFLUENZA VACCINE  06/09/2017  . COLONOSCOPY  10/22/2020  . TETANUS/TDAP  12/26/2024  . DEXA SCAN  Completed  . PNA vac Low Risk Adult  Completed  Plan:   Follow up with PCP today as scheduled.  Continue to eat heart healthy diet (full of fruits, vegetables, whole grains, lean protein, water--limit salt, fat, and sugar intake) and increase physical activity as tolerated.  Continue doing brain stimulating activities (puzzles, reading, adult coloring books, staying active) to keep memory sharp.   Let us know when you are ready to stop smoking.  I have personally reviewed and noted the following in the patient's chart:   . Medical and social history . Use of alcohol, tobacco or illicit drugs  . Current medications and supplements . Functional ability and status . Nutritional status . Physical activity . Advanced directives . List of other physicians . Hospitalizations, surgeries, and ER visits in previous 12 months . Vitals . Screenings to include cognitive, depression, and falls . Referrals and appointments  In addition, I have reviewed and discussed with patient certain preventive protocols, quality metrics, and best practice recommendations. A written personalized care plan for preventive services as well as general preventive health recommendations were provided to patient.     Shela Nevin, [place]  07/19/2017

## 2017-07-19 ENCOUNTER — Encounter: Payer: Self-pay | Admitting: Family

## 2017-07-19 ENCOUNTER — Ambulatory Visit (INDEPENDENT_AMBULATORY_CARE_PROVIDER_SITE_OTHER): Payer: Medicare Other | Admitting: Family

## 2017-07-19 VITALS — BP 139/71 | HR 66 | Ht 67.0 in | Wt 122.0 lb

## 2017-07-19 DIAGNOSIS — Z0000 Encounter for general adult medical examination without abnormal findings: Secondary | ICD-10-CM

## 2017-07-19 DIAGNOSIS — E039 Hypothyroidism, unspecified: Secondary | ICD-10-CM | POA: Diagnosis not present

## 2017-07-19 DIAGNOSIS — Z23 Encounter for immunization: Secondary | ICD-10-CM | POA: Diagnosis not present

## 2017-07-19 DIAGNOSIS — R413 Other amnesia: Secondary | ICD-10-CM | POA: Diagnosis not present

## 2017-07-19 DIAGNOSIS — Z780 Asymptomatic menopausal state: Secondary | ICD-10-CM | POA: Diagnosis not present

## 2017-07-19 DIAGNOSIS — Z1239 Encounter for other screening for malignant neoplasm of breast: Secondary | ICD-10-CM

## 2017-07-19 MED ORDER — ESCITALOPRAM OXALATE 5 MG PO TABS: 5.0000 mg | ORAL_TABLET | Freq: Every day | ORAL | 1 refills | Status: DC

## 2017-07-19 NOTE — Patient Instructions (Signed)
[name] , Thank you for taking time to come for your Medicare Wellness Visit. I appreciate your ongoing commitment to your health goals. Please review the following plan we discussed and let me know if I can assist you in the future.   These are the goals we discussed: Goals      Patient Stated   . Begin volunteering (pt-stated)       This is a list of the screening recommended for you and due dates:  Health Maintenance  Topic Date Due  . Flu Shot  06/09/2017  . Colon Cancer Screening  10/22/2020  . Tetanus Vaccine  12/26/2024  . DEXA scan (bone density measurement)  Completed  . Pneumonia vaccines  Completed    Follow up with PCP today as scheduled.  Continue to eat heart healthy diet (full of fruits, vegetables, whole grains, lean protein, water--limit salt, fat, and sugar intake) and increase physical activity as tolerated.  Continue doing brain stimulating activities (puzzles, reading, adult coloring books, staying active) to keep memory sharp.   Let us know when you are ready to stop smoking.   Health Maintenance for Postmenopausal Women Menopause is a normal process in which your reproductive ability comes to an end. This process happens gradually over a span of months to years, usually between the ages of 3 and 31. Menopause is complete when you have missed 12 consecutive menstrual periods. It is important to talk with your health care provider about some of the most common conditions that affect postmenopausal women, such as heart disease, cancer, and bone loss (osteoporosis). Adopting a healthy lifestyle and getting preventive care can help to promote your health and wellness. Those actions can also lower your chances of developing some of these common conditions. What should I know about menopause? During menopause, you may experience a number of symptoms, such as:  Moderate-to-severe hot flashes.  Night sweats.  Decrease in sex drive.  Mood  swings.  Headaches.  Tiredness.  Irritability.  Memory problems.  Insomnia.  Choosing to treat or not to treat menopausal changes is an individual decision that you make with your health care provider. What should I know about hormone replacement therapy and supplements? Hormone therapy products are effective for treating symptoms that are associated with menopause, such as hot flashes and night sweats. Hormone replacement carries certain risks, especially as you become older. If you are thinking about using estrogen or estrogen with progestin treatments, discuss the benefits and risks with your health care provider. What should I know about heart disease and stroke? Heart disease, heart attack, and stroke become more likely as you age. This may be due, in part, to the hormonal changes that your body experiences during menopause. These can affect how your body processes dietary fats, triglycerides, and cholesterol. Heart attack and stroke are both medical emergencies. There are many things that you can do to help prevent heart disease and stroke:  Have your blood pressure checked at least every 1-2 years. High blood pressure causes heart disease and increases the risk of stroke.  If you are 9-85 years old, ask your health care provider if you should take aspirin to prevent a heart attack or a stroke.  Do not use any tobacco products, including cigarettes, chewing tobacco, or electronic cigarettes. If you need help quitting, ask your health care provider.  It is important to eat a healthy diet and maintain a healthy weight. ? Be sure to include plenty of vegetables, fruits, low-fat dairy products,  and lean protein. ? Avoid eating foods that are high in solid fats, added sugars, or salt (sodium).  Get regular exercise. This is one of the most important things that you can do for your health. ? Try to exercise for at least 150 minutes each week. The type of exercise that you do should  increase your heart rate and make you sweat. This is known as moderate-intensity exercise. ? Try to do strengthening exercises at least twice each week. Do these in addition to the moderate-intensity exercise.  Know your numbers.Ask your health care provider to check your cholesterol and your blood glucose. Continue to have your blood tested as directed by your health care provider.  What should I know about cancer screening? There are several types of cancer. Take the following steps to reduce your risk and to catch any cancer development as early as possible. Breast Cancer  Practice breast self-awareness. ? This means understanding how your breasts normally appear and feel. ? It also means doing regular breast self-exams. Let your health care provider know about any changes, no matter how small.  If you are 39 or older, have a clinician do a breast exam (clinical breast exam or CBE) every year. Depending on your age, family history, and medical history, it may be recommended that you also have a yearly breast X-ray (mammogram).  If you have a family history of breast cancer, talk with your health care provider about genetic screening.  If you are at high risk for breast cancer, talk with your health care provider about having an MRI and a mammogram every year.  Breast cancer (BRCA) gene test is recommended for women who have family members with BRCA-related cancers. Results of the assessment will determine the need for genetic counseling and BRCA1 and for BRCA2 testing. BRCA-related cancers include these types: ? Breast. This occurs in males or females. ? Ovarian. ? Tubal. This may also be called fallopian tube cancer. ? Cancer of the abdominal or pelvic lining (peritoneal cancer). ? Prostate. ? Pancreatic.  Cervical, Uterine, and Ovarian Cancer Your health care provider may recommend that you be screened regularly for cancer of the pelvic organs. These include your ovaries, uterus,  and vagina. This screening involves a pelvic exam, which includes checking for microscopic changes to the surface of your cervix (Pap test).  For women ages 21-65, health care providers may recommend a pelvic exam and a Pap test every three years. For women ages 75-65, they may recommend the Pap test and pelvic exam, combined with testing for human papilloma virus (HPV), every five years. Some types of HPV increase your risk of cervical cancer. Testing for HPV may also be done on women of any age who have unclear Pap test results.  Other health care providers may not recommend any screening for nonpregnant women who are considered low risk for pelvic cancer and have no symptoms. Ask your health care provider if a screening pelvic exam is right for you.  If you have had past treatment for cervical cancer or a condition that could lead to cancer, you need Pap tests and screening for cancer for at least 20 years after your treatment. If Pap tests have been discontinued for you, your risk factors (such as having a new sexual partner) need to be reassessed to determine if you should start having screenings again. Some women have medical problems that increase the chance of getting cervical cancer. In these cases, your health care provider may recommend that you  have screening and Pap tests more often.  If you have a family history of uterine cancer or ovarian cancer, talk with your health care provider about genetic screening.  If you have vaginal bleeding after reaching menopause, tell your health care provider.  There are currently no reliable tests available to screen for ovarian cancer.  Lung Cancer Lung cancer screening is recommended for adults 5-43 years old who are at high risk for lung cancer because of a history of smoking. A yearly low-dose CT scan of the lungs is recommended if you:  Currently smoke.  Have a history of at least 30 pack-years of smoking and you currently smoke or have quit  within the past 15 years. A pack-year is smoking an average of one pack of cigarettes per day for one year.  Yearly screening should:  Continue until it has been 15 years since you quit.  Stop if you develop a health problem that would prevent you from having lung cancer treatment.  Colorectal Cancer  This type of cancer can be detected and can often be prevented.  Routine colorectal cancer screening usually begins at age 64 and continues through age 16.  If you have risk factors for colon cancer, your health care provider may recommend that you be screened at an earlier age.  If you have a family history of colorectal cancer, talk with your health care provider about genetic screening.  Your health care provider may also recommend using home test kits to check for hidden blood in your stool.  A small camera at the end of a tube can be used to examine your colon directly (sigmoidoscopy or colonoscopy). This is done to check for the earliest forms of colorectal cancer.  Direct examination of the colon should be repeated every 5-10 years until age 32. However, if early forms of precancerous polyps or small growths are found or if you have a family history or genetic risk for colorectal cancer, you may need to be screened more often.  Skin Cancer  Check your skin from head to toe regularly.  Monitor any moles. Be sure to tell your health care provider: ? About any new moles or changes in moles, especially if there is a change in a mole's shape or color. ? If you have a mole that is larger than the size of a pencil eraser.  If any of your family members has a history of skin cancer, especially at a young age, talk with your health care provider about genetic screening.  Always use sunscreen. Apply sunscreen liberally and repeatedly throughout the day.  Whenever you are outside, protect yourself by wearing long sleeves, pants, a wide-brimmed hat, and sunglasses.  What should I know  about osteoporosis? Osteoporosis is a condition in which bone destruction happens more quickly than new bone creation. After menopause, you may be at an increased risk for osteoporosis. To help prevent osteoporosis or the bone fractures that can happen because of osteoporosis, the following is recommended:  If you are 76-25 years old, get at least 1,000 mg of calcium and at least 600 mg of vitamin D per day.  If you are older than age 59 but younger than age 66, get at least 1,200 mg of calcium and at least 600 mg of vitamin D per day.  If you are older than age 31, get at least 1,200 mg of calcium and at least 800 mg of vitamin D per day.  Smoking and excessive alcohol intake increase the  risk of osteoporosis. Eat foods that are rich in calcium and vitamin D, and do weight-bearing exercises several times each week as directed by your health care provider. What should I know about how menopause affects my mental health? Depression may occur at any age, but it is more common as you become older. Common symptoms of depression include:  Low or sad mood.  Changes in sleep patterns.  Changes in appetite or eating patterns.  Feeling an overall lack of motivation or enjoyment of activities that you previously enjoyed.  Frequent crying spells.  Talk with your health care provider if you think that you are experiencing depression. What should I know about immunizations? It is important that you get and maintain your immunizations. These include:  Tetanus, diphtheria, and pertussis (Tdap) booster vaccine.  Influenza every year before the flu season begins.  Pneumonia vaccine.  Shingles vaccine.  Your health care provider may also recommend other immunizations. This information is not intended to replace advice given to you by your health care provider. Make sure you discuss any questions you have with your health care provider. Document Released: 12/18/2005 Document Revised: 05/15/2016  Document Reviewed: 07/30/2015 Elsevier Interactive Patient Education  2018 [employer].

## 2017-07-19 NOTE — Addendum Note (Signed)
Addended by: Kelle Darting A on: 07/19/2017 01:56 PM   Modules accepted: Orders

## 2017-07-19 NOTE — Progress Notes (Signed)
Reviewed RN note and agree.

## 2017-07-19 NOTE — Progress Notes (Signed)
Subjective:    [name], female    DOB: [birth year], 76 y.o.   MRN: [id number]  HPI  Nancy [name] is a 76 yr old female who presents today for follow up.  Depression- last visit she reported finding herself more withdrawn and less motivated. We gave her lexapro 5mg . Reports improvement in mood.   Memory Loss- she does have a hx of CVA and is maintained on plavix.     Review of Systems See HPI  Past Medical History:  Diagnosis Date  . Allergy   . Anemia    iron deficiency  . 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 Cataract   . Colon polyps   . CVA (cerebral vascular accident) ([place]) 02/2017  . Depression   . Hyperlipidemia   . Hypertension   . Osteoporosis 01/01/2015  . Rheumatoid arteritis   . Thyroid disease   . TIA (transient ischemic attack) 2016  . Urinary incontinence      Social History   Social History  . Marital status: Divorced    Spouse name: N/A  . Number of children: 8  . Years of education: N/A   Occupational History  .  Retired    Retired from [employer]  . Smoking status: Current Some Day Smoker    Packs/day: 0.25    Years: 32.00    Types: Cigarettes  . Smokeless tobacco: Never Used  . Alcohol use No  . Drug use: No  . Sexual activity: No   Other Topics Concern  . Not on file   Social History Narrative  . No narrative on file    Past Surgical History:  Procedure Laterality Date  . ABDOMINAL HYSTERECTOMY  1976  . CATARACT EXTRACTION Bilateral   . 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 Hypertension Father   . Colon cancer Father   . Ulcerative colitis Mother   . Parkinson's disease Mother   . Heart disease Sister 69       CABG x 3  . Cirrhosis Brother   . Alcohol abuse Brother   . Colitis Daughter   . Colon polyps Daughter   . Heart attack Daughter   . Hypertension Son   . Kidney disease Son        transplant due to  kidney problems after [facility]  . Arthritis Other   . Coronary artery disease Other   . Hyperlipidemia Other   . Hypertension Other   . Stroke Sister        died 84  . Arthritis Sister   . Hypertension Sister   . Thyroid disease Daughter        x 3  . Cirrhosis Brother 65       ETOH abuse    Allergies  Allergen Reactions  . Amlodipine Other (See Comments)    dizziness  . Iron Nausea And Vomiting    Can take infusions     Current Outpatient Prescriptions on File Prior to Visit  Medication Sig Dispense Refill  . atorvastatin (LIPITOR) 40 MG tablet Take 1 tablet (40 mg total) by mouth daily. 30 tablet 5  . clopidogrel (PLAVIX) 75 MG tablet TAKE 1 TABLET BY MOUTH EVERY DAY 30 tablet 5  . Docusate Calcium (STOOL SOFTENER PO) Take 1 tablet by mouth daily.    Marland Kitchen escitalopram (LEXAPRO) 5 MG  tablet Take 1 tablet (5 mg total) by mouth daily. 30 tablet 1  . fluticasone (FLONASE) 50 MCG/ACT nasal spray Place 2 sprays into both nostrils daily. 16 g 5  . folic acid (FOLVITE) 1 MG tablet Take 1 mg by mouth daily.     Marland Kitchen leflunomide (ARAVA) 10 MG tablet Take 10 mg by mouth daily.  3  . levothyroxine (SYNTHROID) 125 MCG tablet Take 1/2 tablet by mouth once daily. 30 tablet 2  . loratadine (CLARITIN) 10 MG tablet Take 10 mg by mouth daily as needed. Reported on 10/23/2015    . methotrexate (RHEUMATREX) 2.5 MG tablet Take 20 mg by mouth once a week.   3  . metoprolol succinate (TOPROL-XL) 50 MG 24 hr tablet TAKE 1 TABLET BY MOUTH EVERY DAY WITH OR IMMEDIATELY FOLLOWING A MEAL 90 tablet 1  . predniSONE (DELTASONE) 5 MG tablet Take 5 mg by mouth as needed.     No current facility-administered medications on file prior to visit.     BP 139/71 (BP Location: Right Arm, Patient Position: Sitting, Cuff Size: Normal)   Pulse 66   Ht 5\' 7"  (1.702 m)   Wt 122 lb (55.3 kg)   SpO2 97%   BMI 19.11 kg/m       Objective:   Physical Exam  Constitutional: She is oriented to person, place, and  time. She appears well-developed and well-nourished.  Neck: No thyromegaly present.  Cardiovascular: Normal rate, regular rhythm and normal heart sounds.   No murmur heard. Pulmonary/Chest: Effort normal and breath sounds normal. No respiratory distress. She has no wheezes.  Musculoskeletal: She exhibits no edema.  Lymphadenopathy:    She has no cervical adenopathy.  Neurological: She is alert and oriented to person, place, and time.  Psychiatric: She has a normal mood and affect. Her behavior is normal. Judgment and thought content normal.          Assessment & Plan:  Depression- PHQ9 score 4 (down form 13 last visit).   Improved on lexapro. Continue same.   Memory loss- She scored 28 on MMSE.  I think her depression was playing a role in her symptoms and this is now improved. Monitor.   Hypothyroid- obtain tsh.    Flu shot today.

## 2017-07-20 ENCOUNTER — Telehealth: Payer: Self-pay | Admitting: Family

## 2017-07-20 DIAGNOSIS — E039 Hypothyroidism, unspecified: Secondary | ICD-10-CM

## 2017-07-20 LAB — TSH: TSH: 0.4 u[IU]/mL (ref 0.35–4.50)

## 2017-07-20 MED ORDER — LEVOTHYROXINE SODIUM 112 MCG PO TABS: 112.0000 ug | ORAL_TABLET | Freq: Every day | ORAL | 3 refills | Status: DC

## 2017-07-20 NOTE — Telephone Encounter (Signed)
Notified pt and scheduled lab appt for 08/31/17 at 10:30am. Future order entered.

## 2017-07-20 NOTE — Telephone Encounter (Signed)
Lab shows we should decrease synthroid to 112 mcg and repeat tsh in 6 weeks.

## 2017-07-29 ENCOUNTER — Ambulatory Visit (HOSPITAL_BASED_OUTPATIENT_CLINIC_OR_DEPARTMENT_OTHER)
Admission: RE | Admit: 2017-07-29 | Discharge: 2017-07-29 | Disposition: A | Discharge status: Home / Self Care | Payer: Medicare Other | Source: Ambulatory Visit | Attending: Family | Admitting: Family

## 2017-07-29 DIAGNOSIS — M8588 Other specified disorders of bone density and structure, other site: Secondary | ICD-10-CM | POA: Insufficient documentation

## 2017-07-29 DIAGNOSIS — Z780 Asymptomatic menopausal state: Secondary | ICD-10-CM | POA: Diagnosis not present

## 2017-07-29 DIAGNOSIS — M810 Age-related osteoporosis without current pathological fracture: Secondary | ICD-10-CM | POA: Diagnosis not present

## 2017-07-29 DIAGNOSIS — M818 Other osteoporosis without current pathological fracture: Secondary | ICD-10-CM | POA: Insufficient documentation

## 2017-07-29 DIAGNOSIS — Z1231 Encounter for screening mammogram for malignant neoplasm of breast: Secondary | ICD-10-CM | POA: Insufficient documentation

## 2017-07-29 DIAGNOSIS — Z1239 Encounter for other screening for malignant neoplasm of breast: Secondary | ICD-10-CM

## 2017-07-31 ENCOUNTER — Telehealth: Payer: Self-pay | Admitting: Family

## 2017-07-31 DIAGNOSIS — M810 Age-related osteoporosis without current pathological fracture: Secondary | ICD-10-CM

## 2017-07-31 NOTE — Telephone Encounter (Signed)
Bone density shows severe osteoporosis. Is she willing to resume reclast infusions or start prolia?

## 2017-08-03 NOTE — Telephone Encounter (Signed)
Let's resume reclast please.

## 2017-08-03 NOTE — Telephone Encounter (Signed)
Notified pt. She states she will proceed with whichever option PCP feels is best.  Please advise?

## 2017-08-05 ENCOUNTER — Other Ambulatory Visit: Payer: Self-pay | Admitting: Family

## 2017-08-06 NOTE — Telephone Encounter (Signed)
I tried to call pt but VM is full/thx dmf

## 2017-08-09 ENCOUNTER — Telehealth: Payer: Self-pay | Admitting: *Deleted

## 2017-08-09 NOTE — Telephone Encounter (Signed)
Received request from [facility] for levothyroxine 139mcg but dose was decreased on 07/20/17 to 18mcg. Spoke with Nancy [name] at [facility] and verified that pt picked up 149mcg dose on 07/20/17. He will delete request for 130mcg.

## 2017-08-18 NOTE — Telephone Encounter (Signed)
Please see below.

## 2017-08-19 NOTE — Telephone Encounter (Signed)
Can we please initiate reclast?

## 2017-08-20 NOTE — Telephone Encounter (Signed)
Notified pt that insurance says no prior auth required but pt may be responsible for 20%. Medicaid may cover the 20% depending on modifiers submitted per Hassan Rowan at [facility] coverage line. Pt has not had a CMP the last 30 days and I called her to schedule lab appt. Pt states that she has been having left ear pain x 1 month and wanted appt to evaluate. Appt scheduled for 08/23/17 at 2:30pm and we can do lab after that. Future order entered.

## 2017-08-23 ENCOUNTER — Ambulatory Visit (INDEPENDENT_AMBULATORY_CARE_PROVIDER_SITE_OTHER): Payer: Medicare Other | Admitting: Family

## 2017-08-23 ENCOUNTER — Encounter: Payer: Self-pay | Admitting: Family

## 2017-08-23 VITALS — BP 164/75 | HR 77 | Temp 98.5°F | Resp 18 | Ht 67.0 in | Wt 122.0 lb

## 2017-08-23 DIAGNOSIS — M810 Age-related osteoporosis without current pathological fracture: Secondary | ICD-10-CM | POA: Diagnosis not present

## 2017-08-23 DIAGNOSIS — I10 Essential (primary) hypertension: Secondary | ICD-10-CM

## 2017-08-23 DIAGNOSIS — H6123 Impacted cerumen, bilateral: Secondary | ICD-10-CM | POA: Diagnosis not present

## 2017-08-23 MED ORDER — METOPROLOL SUCCINATE ER 100 MG PO TB24: 100.0000 mg | ORAL_TABLET | Freq: Every day | ORAL | 3 refills | Status: DC

## 2017-08-23 NOTE — Telephone Encounter (Signed)
Per verbal from PCP, pt decided she wants to go with Prolia so lab work ordered for today was not drawn. Note sent to initiate Prolia per PCP.

## 2017-08-23 NOTE — Patient Instructions (Addendum)
We will work on Financial trader for Prolia and let you know.  Please increase toprol xl from 50mg  to 100mg  once daily for your blood pressure. Call if ear pain worsens or fails to improve.

## 2017-08-23 NOTE — Progress Notes (Signed)
Subjective:    [name], female    DOB: 07-Dec-1940, 76 y.o.   MRN: [id number]  HPI  [name] is a 76 yr old female who presents today with chief complaint of left sided ear pain x 1 month. Right ear pain began last night.   Reports some sharp pain radiating up her had.  Using debrox.    Osteoporosis- [employer] will cover 80% of the reclast infusion, this may cost her $200.    Review of Systems    see HPI  Past Medical History:  Diagnosis Date  . Allergy   . Anemia    iron deficiency  . 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 Cataract   . Colon polyps   . CVA (cerebral vascular accident) ([place]) 02/2017  . Depression   . Hyperlipidemia   . Hypertension   . Osteoporosis 01/01/2015  . Rheumatoid arteritis   . Thyroid disease   . TIA (transient ischemic attack) 2016  . Urinary incontinence      Social History   Social History  . Marital status: Divorced    Spouse name: N/A  . Number of children: 8  . Years of education: N/A   Occupational History  .  Retired    Retired from [employer]  . Smoking status: Current Some Day Smoker    Packs/day: 0.25    Years: 32.00    Types: Cigarettes  . Smokeless tobacco: Never Used  . Alcohol use No  . Drug use: No  . Sexual activity: No   Other Topics Concern  . Not on file   Social History Narrative  . No narrative on file    Past Surgical History:  Procedure Laterality Date  . ABDOMINAL HYSTERECTOMY  1976  . CATARACT EXTRACTION Bilateral   . 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 Hypertension Father   . Colon cancer Father   . Ulcerative colitis Mother   . Parkinson's disease Mother   . Heart disease Sister 41       CABG x 3  . Cirrhosis Brother   . Alcohol abuse Brother   . Colitis Daughter   . Colon polyps Daughter   . Heart attack Daughter   . Hypertension Son   . Kidney disease Son        transplant due  to kidney problems after [facility]  . Arthritis Other   . Coronary artery disease Other   . Hyperlipidemia Other   . Hypertension Other   . Stroke Sister        died 7  . Arthritis Sister   . Hypertension Sister   . Thyroid disease Daughter        x 3  . Cirrhosis Brother 65       ETOH abuse    Allergies  Allergen Reactions  . Amlodipine Other (See Comments)    dizziness  . Iron Nausea And Vomiting    Can take infusions     Current Outpatient Prescriptions on File Prior to Visit  Medication Sig Dispense Refill  . atorvastatin (LIPITOR) 40 MG tablet Take 1 tablet (40 mg total) by mouth daily. 30 tablet 5  . clopidogrel (PLAVIX) 75 MG tablet TAKE 1 TABLET BY MOUTH EVERY DAY 30 tablet 5  . Docusate Calcium (STOOL SOFTENER PO) Take 1 tablet by mouth daily.    Marland Kitchen  escitalopram (LEXAPRO) 5 MG tablet Take 1 tablet (5 mg total) by mouth daily. 90 tablet 1  . fluticasone (FLONASE) 50 MCG/ACT nasal spray Place 2 sprays into both nostrils daily. 16 g 5  . folic acid (FOLVITE) 1 MG tablet Take 1 mg by mouth daily.     Marland Kitchen leflunomide (ARAVA) 10 MG tablet Take 10 mg by mouth daily.  3  . levothyroxine (SYNTHROID, LEVOTHROID) 112 MCG tablet Take 1 tablet (112 mcg total) by mouth daily. 9030 tablet 3  . loratadine (CLARITIN) 10 MG tablet Take 10 mg by mouth daily as needed. Reported on 10/23/2015    . methotrexate (RHEUMATREX) 2.5 MG tablet Take 20 mg by mouth once a week.   3  . metoprolol succinate (TOPROL-XL) 50 MG 24 hr tablet TAKE 1 TABLET BY MOUTH EVERY DAY WITH OR IMMEDIATELY FOLLOWING A MEAL 90 tablet 1  . predniSONE (DELTASONE) 5 MG tablet Take 5 mg by mouth as needed.     No current facility-administered medications on file prior to visit.     BP (!) 164/75   Pulse 77   Temp 98.5 F (36.9 C) (Oral)   Resp 18   Ht 5\' 7"  (1.702 m)   Wt 122 lb (55.3 kg)   SpO2 99%   BMI 19.11 kg/m    Objective:   Physical Exam  Constitutional: She appears well-developed and  well-nourished.  HENT:  Head: Normocephalic and atraumatic.  Narrow ear canals. Bilateral cerumen impaction R>L  Cardiovascular: Normal rate, regular rhythm and normal heart sounds.   No murmur heard. Pulmonary/Chest: Effort normal and breath sounds normal. No respiratory distress. She has no wheezes.  Psychiatric: She has a normal mood and affect. Her behavior is normal. Judgment and thought content normal.          Assessment & Plan:  Cerumen impaction- cerumen removed with irrigation.  Normal TM's revealed bilaterally.   Osteoporosis- will see what her cost is for prolia.  If covered by [facility] would prefer due to cost.    HTN- BP is elevated.  She is maintained on toprol xl 50mg , will increase to 100mg  once daily. Follow up in 1 month.

## 2017-08-31 ENCOUNTER — Other Ambulatory Visit: Payer: Medicare Other

## 2017-08-31 DIAGNOSIS — M0579 Rheumatoid arthritis with rheumatoid factor of multiple sites without organ or systems involvement: Secondary | ICD-10-CM | POA: Diagnosis not present

## 2017-08-31 DIAGNOSIS — M2550 Pain in unspecified joint: Secondary | ICD-10-CM | POA: Diagnosis not present

## 2017-08-31 DIAGNOSIS — M170 Bilateral primary osteoarthritis of knee: Secondary | ICD-10-CM | POA: Diagnosis not present

## 2017-08-31 DIAGNOSIS — Z79899 Other long term (current) drug therapy: Secondary | ICD-10-CM | POA: Diagnosis not present

## 2017-09-20 ENCOUNTER — Telehealth: Payer: Self-pay | Admitting: Family

## 2017-09-20 ENCOUNTER — Ambulatory Visit (INDEPENDENT_AMBULATORY_CARE_PROVIDER_SITE_OTHER): Payer: Medicare Other | Admitting: Family

## 2017-09-20 ENCOUNTER — Encounter: Payer: Self-pay | Admitting: Family

## 2017-09-20 VITALS — BP 125/50 | HR 66 | Temp 98.3°F | Resp 16 | Ht 67.0 in | Wt 119.0 lb

## 2017-09-20 DIAGNOSIS — H9202 Otalgia, left ear: Secondary | ICD-10-CM | POA: Diagnosis not present

## 2017-09-20 DIAGNOSIS — I10 Essential (primary) hypertension: Secondary | ICD-10-CM

## 2017-09-20 NOTE — Telephone Encounter (Signed)
Sent email to [place], awaiting response.

## 2017-09-20 NOTE — Telephone Encounter (Signed)
Pt would like to proceed with prolia. Could you please check status of approval with insurance?

## 2017-09-20 NOTE — Progress Notes (Signed)
Subjective:    [name], female    DOB: 09-23-1941, 76 y.o.   MRN: [id number]  HPI  Patient is a 76 year old female who presents today for follow-up. Ear pain- has "crawling sensation" in her left ear.   HTN- last visit we increased her toprol xl from 50mg  to 100mg .  BP Readings from Last 3 Encounters:  09/20/17 (!) 167/59  08/23/17 (!) 164/75  07/19/17 139/71    Review of Systems    see HPI  Past Medical History:  Diagnosis Date  . Allergy   . Anemia    iron deficiency  . 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 Cataract   . Colon polyps   . CVA (cerebral vascular accident) ([place]) 02/2017  . Depression   . Hyperlipidemia   . Hypertension   . Osteoporosis 01/01/2015  . Rheumatoid arteritis   . Thyroid disease   . TIA (transient ischemic attack) 2016  . Urinary incontinence      Social History   Socioeconomic History  . Marital status: Divorced    Spouse name: Not on file  . Number of children: 8  . Years of education: Not on file  . Highest education level: Not on file  Social Needs  . Financial resource strain: Not on file  . Food insecurity - worry: Not on file  . Food insecurity - inability: Not on file  . Transportation needs - medical: Not on file  . Transportation needs - non-medical: Not on file  Occupational History    Employer: RETIRED    Comment: Retired from [employer]  . Smoking status: Current Some Day Smoker    Packs/day: 0.25    Years: 32.00    Pack years: 8.00    Types: Cigarettes  . Smokeless tobacco: Never Used  Substance and Sexual Activity  . Alcohol use: No    Alcohol/week: 0.0 oz  . Drug use: No  . Sexual activity: No  Other Topics Concern  . Not on file  Social History Narrative  . Not on file    Past Surgical History:  Procedure Laterality Date  . ABDOMINAL HYSTERECTOMY  1976  . CATARACT EXTRACTION Bilateral   . TUMOR REMOVAL  last was 1976   left ankle x3    Family History  Problem  Relation Age of Onset  . Hypertension Father   . Colon cancer Father   . Ulcerative colitis Mother   . Parkinson's disease Mother   . Heart disease Sister 85       CABG x 3  . Cirrhosis Brother   . Alcohol abuse Brother   . Colitis Daughter   . Colon polyps Daughter   . Heart attack Daughter   . Hypertension Son   . Kidney disease Son        transplant due to kidney problems after [facility]  . Arthritis Other   . Coronary artery disease Other   . Hyperlipidemia Other   . Hypertension Other   . Stroke Sister        died 84  . Arthritis Sister   . Hypertension Sister   . Thyroid disease Daughter        x 3  . Cirrhosis Brother 65       ETOH abuse    Allergies  Allergen Reactions  . Amlodipine Other (See Comments)    dizziness  . Iron Nausea And Vomiting    Can take infusions  Current Outpatient Medications on File Prior to Visit  Medication Sig Dispense Refill  . atorvastatin (LIPITOR) 40 MG tablet Take 1 tablet (40 mg total) by mouth daily. 30 tablet 5  . clopidogrel (PLAVIX) 75 MG tablet TAKE 1 TABLET BY MOUTH EVERY DAY 30 tablet 5  . Docusate Calcium (STOOL SOFTENER PO) Take 1 tablet by mouth daily.    Marland Kitchen escitalopram (LEXAPRO) 5 MG tablet Take 1 tablet (5 mg total) by mouth daily. 90 tablet 1  . fluticasone (FLONASE) 50 MCG/ACT nasal spray Place 2 sprays into both nostrils daily. 16 g 5  . folic acid (FOLVITE) 1 MG tablet Take 1 mg by mouth daily.     Marland Kitchen leflunomide (ARAVA) 10 MG tablet Take 10 mg by mouth daily.  3  . levothyroxine (SYNTHROID, LEVOTHROID) 112 MCG tablet Take 1 tablet (112 mcg total) by mouth daily. 9030 tablet 3  . loratadine (CLARITIN) 10 MG tablet Take 10 mg by mouth daily as needed. Reported on 10/23/2015    . methotrexate (RHEUMATREX) 2.5 MG tablet Take 20 mg by mouth once a week.   3  . metoprolol succinate (TOPROL-XL) 100 MG 24 hr tablet Take 1 tablet (100 mg total) by mouth daily. Take with or immediately following a meal. 30 tablet 3    . predniSONE (DELTASONE) 5 MG tablet Take 5 mg by mouth as needed.     No current facility-administered medications on file prior to visit.     BP (!) 167/59 (BP Location: Right Arm, Cuff Size: Normal)   Pulse 66   Temp 98.3 F (36.8 C) (Oral)   Resp 16   Ht 5\' 7"  (1.702 m)   Wt 119 lb (54 kg)   SpO2 100%   BMI 18.64 kg/m    Objective:   Physical Exam  Constitutional: She appears well-developed and well-nourished.  HENT:  Right Ear: Tympanic membrane and ear canal normal.  Left Ear: Tympanic membrane and ear canal normal.  Cardiovascular: Normal rate, regular rhythm and normal heart sounds.  No murmur heard. Pulmonary/Chest: Effort normal and breath sounds normal. No respiratory distress. She has no wheezes.  Psychiatric: She has a normal mood and affect. Her behavior is normal. Judgment and thought content normal.          Assessment & Plan:  Ear discomfort- normal exam, reassurance provided.  HTN- follow up BP is improved. Continue current meds.

## 2017-09-20 NOTE — Patient Instructions (Signed)
Please continue current medications.

## 2017-10-04 NOTE — Telephone Encounter (Signed)
Received patients prolia benefits No PA required Prolia and admin are covered at 100% with patients plan  Patient may owe approximately $17 OOP  Letter mailed informing patient of benefits

## 2017-10-04 NOTE — Telephone Encounter (Signed)
Notified pt and she is agreeable to proceed with injection. Nurse visit scheduled for 10/15/17 at 9:15am. Medication ordered.

## 2017-10-11 NOTE — Telephone Encounter (Signed)
Medication has been received in the office.

## 2017-10-12 ENCOUNTER — Other Ambulatory Visit: Payer: Self-pay | Admitting: Family

## 2017-10-15 ENCOUNTER — Telehealth: Payer: Self-pay | Admitting: Family

## 2017-10-15 ENCOUNTER — Ambulatory Visit (INDEPENDENT_AMBULATORY_CARE_PROVIDER_SITE_OTHER): Payer: Medicare Other

## 2017-10-15 DIAGNOSIS — M810 Age-related osteoporosis without current pathological fracture: Secondary | ICD-10-CM

## 2017-10-15 MED ORDER — DENOSUMAB 60 MG/ML ~~LOC~~ SOLN
60.0000 mg | Freq: Once | SUBCUTANEOUS | Status: AC
  Administered 2017-10-15: 60 mg via SUBCUTANEOUS

## 2017-10-15 NOTE — Telephone Encounter (Signed)
Returned pt. Call. Pt. Did go in for Prolia injection today as scheduled.

## 2017-10-15 NOTE — Progress Notes (Signed)
Pre visit review using our clinic tool,if applicable. No additional management support is needed unless otherwise documented below in the visit note.   Patient in for initial Prolia injection per order from M. Edwena Blow NP. Due to patient having Osteoporosis.  Patient given Prolia 60 mg SQ left arm and tolerated well. 6 month reminder card given.  Patient had concerns regarding several teeth that  had been extracted on yesterday. Checked with provider who states injection should not effect extractions.

## 2017-10-15 NOTE — Telephone Encounter (Signed)
Copied from [place] [phone]. Topic: Quick Communication - See Telephone Encounter >> Oct 15, 2017  7:30 AM Synthia Innocent wrote: CRM for notification. See Telephone encounter for: Patient had 7 teeth pulled and is scheduled for a Prolia injection today at 9:15. She is on antiobitics, is this ok with her getting the injection?  10/15/17.

## 2017-11-16 DIAGNOSIS — M170 Bilateral primary osteoarthritis of knee: Secondary | ICD-10-CM | POA: Diagnosis not present

## 2017-11-16 DIAGNOSIS — M0579 Rheumatoid arthritis with rheumatoid factor of multiple sites without organ or systems involvement: Secondary | ICD-10-CM | POA: Diagnosis not present

## 2017-11-16 DIAGNOSIS — M2550 Pain in unspecified joint: Secondary | ICD-10-CM | POA: Diagnosis not present

## 2017-11-16 DIAGNOSIS — Z6821 Body mass index (BMI) 21.0-21.9, adult: Secondary | ICD-10-CM | POA: Diagnosis not present

## 2017-11-16 DIAGNOSIS — Z79899 Other long term (current) drug therapy: Secondary | ICD-10-CM | POA: Diagnosis not present

## 2017-11-17 DIAGNOSIS — H40023 Open angle with borderline findings, high risk, bilateral: Secondary | ICD-10-CM | POA: Diagnosis not present

## 2017-11-17 DIAGNOSIS — H20012 Primary iridocyclitis, left eye: Secondary | ICD-10-CM | POA: Diagnosis not present

## 2017-11-17 DIAGNOSIS — H353131 Nonexudative age-related macular degeneration, bilateral, early dry stage: Secondary | ICD-10-CM | POA: Diagnosis not present

## 2017-11-17 DIAGNOSIS — H18413 Arcus senilis, bilateral: Secondary | ICD-10-CM | POA: Diagnosis not present

## 2017-11-17 DIAGNOSIS — H11423 Conjunctival edema, bilateral: Secondary | ICD-10-CM | POA: Diagnosis not present

## 2017-11-27 ENCOUNTER — Emergency Department (HOSPITAL_COMMUNITY): Payer: Medicare Other

## 2017-11-27 ENCOUNTER — Encounter (HOSPITAL_COMMUNITY): Payer: Self-pay

## 2017-11-27 ENCOUNTER — Emergency Department (HOSPITAL_COMMUNITY)
Admission: EM | Admit: 2017-11-27 | Discharge: 2017-11-27 | Disposition: A | Discharge status: Home / Self Care | Payer: Medicare Other | Attending: Emergency Medicine | Admitting: Emergency Medicine

## 2017-11-27 DIAGNOSIS — R55 Syncope and collapse: Secondary | ICD-10-CM | POA: Insufficient documentation

## 2017-11-27 DIAGNOSIS — F17210 Nicotine dependence, cigarettes, uncomplicated: Secondary | ICD-10-CM | POA: Diagnosis not present

## 2017-11-27 DIAGNOSIS — R531 Weakness: Secondary | ICD-10-CM | POA: Diagnosis not present

## 2017-11-27 DIAGNOSIS — R109 Unspecified abdominal pain: Secondary | ICD-10-CM | POA: Insufficient documentation

## 2017-11-27 DIAGNOSIS — E039 Hypothyroidism, unspecified: Secondary | ICD-10-CM | POA: Insufficient documentation

## 2017-11-27 DIAGNOSIS — R404 Transient alteration of awareness: Secondary | ICD-10-CM | POA: Diagnosis not present

## 2017-11-27 DIAGNOSIS — R1013 Epigastric pain: Secondary | ICD-10-CM | POA: Diagnosis not present

## 2017-11-27 DIAGNOSIS — I10 Essential (primary) hypertension: Secondary | ICD-10-CM | POA: Insufficient documentation

## 2017-11-27 DIAGNOSIS — R479 Unspecified speech disturbances: Secondary | ICD-10-CM | POA: Diagnosis not present

## 2017-11-27 DIAGNOSIS — Z79899 Other long term (current) drug therapy: Secondary | ICD-10-CM | POA: Diagnosis not present

## 2017-11-27 DIAGNOSIS — R1900 Intra-abdominal and pelvic swelling, mass and lump, unspecified site: Secondary | ICD-10-CM | POA: Diagnosis not present

## 2017-11-27 LAB — URINALYSIS, ROUTINE W REFLEX MICROSCOPIC
Bilirubin Urine: NEGATIVE
Glucose, UA: NEGATIVE mg/dL
Hgb urine dipstick: NEGATIVE
Ketones, ur: NEGATIVE mg/dL
Leukocytes, UA: NEGATIVE
Nitrite: NEGATIVE
Protein, ur: NEGATIVE mg/dL
Specific Gravity, Urine: 1.004 — ABNORMAL LOW (ref 1.005–1.030)
pH: 7 (ref 5.0–8.0)

## 2017-11-27 LAB — CBC WITH DIFFERENTIAL/PLATELET
Basophils Absolute: 0 10*3/uL (ref 0.0–0.1)
Basophils Relative: 0 %
Eosinophils Absolute: 0.3 10*3/uL (ref 0.0–0.7)
Eosinophils Relative: 3 %
HCT: 41.3 % (ref 36.0–46.0)
Hemoglobin: 13 g/dL (ref 12.0–15.0)
Lymphocytes Relative: 22 %
Lymphs Abs: 2 10*3/uL (ref 0.7–4.0)
MCH: 28.6 pg (ref 26.0–34.0)
MCHC: 31.5 g/dL (ref 30.0–36.0)
MCV: 91 fL (ref 78.0–100.0)
Monocytes Absolute: 0.8 10*3/uL (ref 0.1–1.0)
Monocytes Relative: 8 %
Neutro Abs: 6.3 10*3/uL (ref 1.7–7.7)
Neutrophils Relative %: 67 %
Platelets: 246 10*3/uL (ref 150–400)
RBC: 4.54 MIL/uL (ref 3.87–5.11)
RDW: 16 % — ABNORMAL HIGH (ref 11.5–15.5)
WBC: 9.4 10*3/uL (ref 4.0–10.5)

## 2017-11-27 LAB — BASIC METABOLIC PANEL
Anion gap: 13 (ref 5–15)
BUN: 14 mg/dL (ref 6–20)
CO2: 21 mmol/L — ABNORMAL LOW (ref 22–32)
Calcium: 9.1 mg/dL (ref 8.9–10.3)
Chloride: 104 mmol/L (ref 101–111)
Creatinine, Ser: 0.8 mg/dL (ref 0.44–1.00)
GFR calc Af Amer: >60 mL/min (ref >60)
GFR calc non Af Amer: >60 mL/min (ref >60)
Glucose, Bld: 83 mg/dL (ref 65–99)
Potassium: 3.9 mmol/L (ref 3.5–5.1)
Sodium: 138 mmol/L (ref 135–145)

## 2017-11-27 LAB — HEPATIC FUNCTION PANEL
ALT: 8 U/L — ABNORMAL LOW (ref 14–54)
AST: 18 U/L (ref 15–41)
Albumin: 3.4 g/dL — ABNORMAL LOW (ref 3.5–5.0)
Alkaline Phosphatase: 56 U/L (ref 38–126)
Bilirubin, Direct: 0.1 mg/dL (ref 0.1–0.5)
Indirect Bilirubin: 0.4 mg/dL (ref 0.3–0.9)
Total Bilirubin: 0.5 mg/dL (ref 0.3–1.2)
Total Protein: 7.1 g/dL (ref 6.5–8.1)

## 2017-11-27 LAB — TROPONIN I: Troponin I: <0.03 ng/mL (ref <0.03)

## 2017-11-27 LAB — LIPASE, BLOOD: Lipase: 28 U/L (ref 11–51)

## 2017-11-27 MED ORDER — IOPAMIDOL (ISOVUE-370) INJECTION 76%
INTRAVENOUS | Status: AC
  Administered 2017-11-27: 100 mL via INTRAVENOUS
  Filled 2017-11-27: qty 100

## 2017-11-27 MED ORDER — SODIUM CHLORIDE 0.9 % IV BOLUS (SEPSIS)
500.0000 mL | Freq: Once | INTRAVENOUS | Status: AC
  Administered 2017-11-27: 500 mL via INTRAVENOUS

## 2017-11-27 NOTE — ED Notes (Signed)
Pt ambulated to the bathroom w/ minimal assist w/ a steady gait.

## 2017-11-27 NOTE — Discharge Instructions (Signed)
If you were given medicines take as directed.  If you are on coumadin or contraceptives realize their levels and effectiveness is altered by many different medicines.  If you have any reaction (rash, tongues swelling, other) to the medicines stop taking and see a physician.    If your blood pressure was elevated in the ER make sure you follow up for management with a primary doctor or return for chest pain, shortness of breath or stroke symptoms.  Please follow up as directed and return to the ER or see a physician for new or worsening symptoms.  Thank you. Vitals:   11/27/17 1522 11/27/17 1527 11/27/17 1856  BP:  (!) 149/60 135/89  Pulse:  (!) 57 64  Resp:  15 16  Temp:  97.6 F (36.4 C)   TempSrc:  Oral   SpO2: 97% 100% 98%

## 2017-11-27 NOTE — ED Provider Notes (Signed)
[facility] EMERGENCY DEPARTMENT Provider Note   CSN: [id number] Arrival date & time: 11/27/17  1518     History   Chief Complaint Chief Complaint  Patient presents with  . Near Syncope    HPI Nancy [name] is a 77 y.o. female.  Patient with history of anemia, vaginal cancer, stroke, compliant with Plavix, arthritis presents with near syncope, fatigue abdominal pain. Patient was out shopping with her daughter and her daughter know she was more tired than normal. Patient had extreme fatigue episode/near syncope however did not lose consciousness. Patient also complained of transient abdominal pain that radiated to the back the epigastric region. The pain is resolved. Patient denies unilateral strokelike symptoms except for difficulty with speech for a few minutes. Currently patient feels fatigued.      Past Medical History:  Diagnosis Date  . Allergy   . Anemia    iron deficiency  . 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 Cataract   . Colon polyps   . CVA (cerebral vascular accident) ([place]) 02/2017  . Depression   . Hyperlipidemia   . Hypertension   . Osteoporosis 01/01/2015  . Rheumatoid arteritis   . Thyroid disease   . TIA (transient ischemic attack) 2016  . Urinary incontinence     Patient Active Problem List   Diagnosis Date Noted  . History of CVA (cerebrovascular accident) 03/16/2017  . Hematuria 03/16/2017  . Faintness 12/09/2015  . Loss of weight 10/15/2015  . Anemia, iron deficiency 09/30/2015  . Hyperglycemia 06/07/2015  . Near syncope 05/15/2015  . Osteoporosis 01/01/2015  . Expressive aphasia 12/16/2014  . TIA (transient ischemic attack) 12/16/2014  . Pain in joint, ankle and foot 09/06/2014  . Constipation 09/06/2014  . Rheumatoid arthritis ([place]) 07/25/2014  . Sciatica 01/31/2014  . Hypothyroidism 01/12/2012  . Back pain 01/11/2012  . Edema 06/15/2011  . ANEMIA, B12 DEFICIENCY 11/19/2010  . ANXIETY 10/14/2010  . 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 Hyperlipidemia 12/17/2009  . Iron deficiency anemia 12/17/2009  . TOBACCO ABUSE 12/17/2009  . Depression 12/17/2009  . Essential hypertension 12/17/2009  . ALLERGIC RHINITIS 12/17/2009  . ARTHRITIS, RHEUMATOID 12/17/2009  . OSTEOPOROSIS 12/17/2009  . URINARY INCONTINENCE 12/17/2009    Past Surgical History:  Procedure Laterality Date  . ABDOMINAL HYSTERECTOMY  1976  . CATARACT EXTRACTION Bilateral   . TUMOR REMOVAL  last was 1976   left ankle x3    OB History    No data available       Home Medications    Prior to Admission medications   Medication Sig Start Date End Date Taking? Authorizing Provider  atorvastatin (LIPITOR) 40 MG tablet Take 1 tablet (40 mg total) by mouth daily. 02/11/16   Debbrah Alar, NP  clopidogrel (PLAVIX) 75 MG tablet TAKE 1 TABLET BY MOUTH EVERY DAY 10/13/17   Debbrah Alar, NP  Docusate Calcium (STOOL SOFTENER PO) Take 1 tablet by mouth daily.    [provider]  escitalopram (LEXAPRO) 5 MG tablet Take 1 tablet (5 mg total) by mouth daily. 07/19/17   Debbrah Alar, NP  fluticasone (FLONASE) 50 MCG/ACT nasal spray Place 2 sprays into both nostrils daily. 08/31/16 08/31/17  Debbrah Alar, NP  folic acid (FOLVITE) 1 MG tablet Take 1 mg by mouth daily.     [provider]  leflunomide (ARAVA) 10 MG tablet Take 10 mg by mouth daily. 08/15/15   [provider]  levothyroxine (SYNTHROID, LEVOTHROID) 112 MCG tablet Take 1 tablet (  112 mcg total) by mouth daily. 07/20/17   Debbrah Alar, NP  loratadine (CLARITIN) 10 MG tablet Take 10 mg by mouth daily as needed. Reported on 10/23/2015    [provider]  methotrexate (RHEUMATREX) 2.5 MG tablet Take 20 mg by mouth once a week.  03/05/17   [provider]  metoprolol succinate (TOPROL-XL) 100 MG 24 hr tablet Take 1 tablet (100 mg total) by mouth daily. Take with or immediately following a meal. 08/23/17   Debbrah Alar, NP    predniSONE (DELTASONE) 5 MG tablet Take 5 mg by mouth as needed. 02/08/17   [provider]    Family History Family History  Problem Relation Age of Onset  . Hypertension Father   . Colon cancer Father   . Ulcerative colitis Mother   . Parkinson's disease Mother   . Heart disease Sister 81       CABG x 3  . Cirrhosis Brother   . Alcohol abuse Brother   . Colitis Daughter   . Colon polyps Daughter   . Heart attack Daughter   . Hypertension Son   . Kidney disease Son        transplant due to kidney problems after [facility]  . Arthritis Other   . Coronary artery disease Other   . Hyperlipidemia Other   . Hypertension Other   . Stroke Sister        died 37  . Arthritis Sister   . Hypertension Sister   . Thyroid disease Daughter        x 3  . Cirrhosis Brother 1       ETOH abuse    Social History Social History   Tobacco Use  . Smoking status: Current Some Day Smoker    Packs/day: 0.25    Years: 32.00    Pack years: 8.00    Types: Cigarettes  . Smokeless tobacco: Never Used  Substance Use Topics  . Alcohol use: No    Alcohol/week: 0.0 oz  . Drug use: No     Allergies   Amlodipine and Iron   Review of Systems Review of Systems  Constitutional: Positive for appetite change and fever. Negative for chills.  HENT: Negative for congestion.   Eyes: Negative for visual disturbance.  Respiratory: Negative for shortness of breath.   Cardiovascular: Negative for chest pain.  Gastrointestinal: Positive for abdominal pain. Negative for vomiting.  Genitourinary: Negative for dysuria and flank pain.  Musculoskeletal: Negative for back pain, neck pain and neck stiffness.  Skin: Negative for rash.  Neurological: Positive for light-headedness. Negative for headaches.     Physical Exam Updated Vital Signs BP (!) 166/68 (BP Location: Right Arm)   Pulse (!) 59   Temp 97.9 F (36.6 C) (Oral)   Resp 16   SpO2 99%   Physical Exam  Constitutional: She  appears well-developed and well-nourished. No distress.  HENT:  Head: Normocephalic and atraumatic.  Dry mucous membranes.  Eyes: Conjunctivae are normal.  Neck: Neck supple.  Cardiovascular: Normal rate and regular rhythm.  Pulmonary/Chest: Effort normal and breath sounds normal. No respiratory distress.  Abdominal: Soft. There is no tenderness.  Musculoskeletal: She exhibits no edema.  Neurological: She is alert. No cranial nerve deficit. GCS eye subscore is 4. GCS verbal subscore is 5. GCS motor subscore is 6.  5+ strength in UE and LE with f/e at major joints. Sensation to palpation intact in UE and LE. CNs 2-12 grossly intact.  EOMFI.  PERRL.  Finger nose and coordination intact bilateral.   Visual fields intact to finger testing. No nystagmus   Skin: Skin is warm and dry.  Psychiatric: She has a normal mood and affect.  Nursing note and vitals reviewed.    ED Treatments / Results  Labs (all labs ordered are listed, but only abnormal results are displayed) Labs Reviewed  CBC WITH DIFFERENTIAL/PLATELET - Abnormal; Notable for the following components:      Result Value   RDW 16.0 (*)    All other components within normal limits  BASIC METABOLIC PANEL - Abnormal; Notable for the following components:   CO2 21 (*)    All other components within normal limits  HEPATIC FUNCTION PANEL - Abnormal; Notable for the following components:   Albumin 3.4 (*)    ALT 8 (*)    All other components within normal limits  URINALYSIS, ROUTINE W REFLEX MICROSCOPIC - Abnormal; Notable for the following components:   Color, Urine STRAW (*)    Specific Gravity, Urine 1.004 (*)    All other components within normal limits  LIPASE, BLOOD  TROPONIN I    EKG  EKG Interpretation  Date/Time:  Saturday November 27 2017 15:38:59 EST Ventricular Rate:  57 PR Interval:  176 QRS Duration: 78 QT Interval:  460 QTC Calculation: 447 R Axis:   80 Text Interpretation:  Sinus bradycardia  Otherwise normal ECG Confirmed by Elnora Morrison [phone]) on 11/27/2017 4:03:33 PM       Radiology Ct Head Wo Contrast  Result Date: 11/27/2017 CLINICAL DATA:  Near syncope today.  Episode of unclear speech. EXAM: CT HEAD WITHOUT CONTRAST TECHNIQUE: Contiguous axial images were obtained from the base of the skull through the vertex without intravenous contrast. COMPARISON:  03/04/2017 FINDINGS: Brain: No evidence of acute infarction, hemorrhage, hydrocephalus, extra-axial collection or mass lesion/mass effect. Overall mild cerebral volume loss which appears mildly progressed from prior. Vascular: No hyperdense vessel or unexpected calcification. Skull: Normal. Negative for fracture or focal lesion. Sinuses/Orbits: No acute finding.  Bilateral cataract resection. IMPRESSION: No acute finding.  No evidence of infarct. Electronically Signed   By: Monte Fantasia M.D.   On: 11/27/2017 19:18   Ct Angio Abd/pel W And/or Wo Contrast  Result Date: 11/27/2017 CLINICAL DATA:  Pulsatile abdominal mass. EXAM: CTA ABDOMEN AND PELVIS wITHOUT AND WITH CONTRAST TECHNIQUE: Multidetector CT imaging of the abdomen and pelvis was performed using the standard protocol during bolus administration of intravenous contrast. Multiplanar reconstructed images and MIPs were obtained and reviewed to evaluate the vascular anatomy. CONTRAST:  161mL ISOVUE-370 IOPAMIDOL (ISOVUE-370) INJECTION 76% COMPARISON:  CT 4 2 18  FINDINGS: VASCULAR Aorta: No abdominal aortic aneurysm. Abdominal aorta is normal caliber at 15 mm. There is intimal calcification. No dissection. Celiac: Narrowing at the proximal aspect of the celiac trunk by with full reconstitution. (image 79 - 80 of series 9. SMA: Patent Renals: Bilateral single patent renal arteries IMA: Patent Inflow: Nonaneurysmal.  Mild intimal calcification Proximal Outflow: Normal Veins: Not opacified unremarkable Review of the MIP images confirms the above findings. NON-VASCULAR Hepatobiliary:  No focal hepatic lesion. No biliary duct dilatation. Gallbladder is normal. Common bile duct is normal. Pancreas: Pancreas is normal. No ductal dilatation. No pancreatic inflammation. Spleen: Normal Adrenals/urinary tract: Adrenal glands and kidneys are normal. The ureters and bladder normal. Stomach/Bowel: Stomach, small bowel, appendix, and cecum are normal. The colon and rectosigmoid colon are normal. Vascular/Lymphatic: Abdominal aorta is normal caliber. There is no retroperitoneal or periportal lymphadenopathy. No pelvic lymphadenopathy. Reproductive:  Post hysterectomy Other: No free fluid. Musculoskeletal: No aggressive osseous lesion. IMPRESSION: VASCULAR 1.  No abdominal aortic aneurysm or dissection. 2.  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 3.  Aortic Atherosclerosis (ICD10-I70.0). NON-VASCULAR No acute abdominopelvic findings. Electronically Signed   By: Suzy Bouchard M.D.   On: 11/27/2017 19:32    Procedures Procedures (including critical care time)  Medications Ordered in ED Medications  sodium chloride 0.9 % bolus 500 mL (0 mLs Intravenous Stopped 11/27/17 1855)  iopamidol (ISOVUE-370) 76 % injection (100 mLs Intravenous Contrast Given 11/27/17 1833)     Initial Impression / Assessment and Plan / ED Course  I have reviewed the triage vital signs and the nursing notes.  Pertinent labs & imaging results that were available during my care of the patient were reviewed by me and considered in my medical decision making (see chart for details).    Patient presents after episode of significant fatigue/lightheadedness however was also accompanied by abdominal pain and speech difficulty. With multiple symptoms difficult to discern if primarily stroke concern versus speech difficulty from near syncope/abdominal pain. Plan for CT scan the head abdomen pelvis, blood work, urinalysis and reassessment. Likely observation.\  Patient improved in the ER with IV fluids and  observation. CT scan result unremarkable. Blood work reviewed with patient reassuring. Discussed close outpatient follow-up. Neurology on discussion does not feel history consistent with stroke.    Final Clinical Impressions(s) / ED Diagnoses   Final diagnoses:  Near syncope  Difficulty with speech  Abdominal pain, acute    ED Discharge Orders    None       Elnora Morrison, MD 11/27/17 2245

## 2017-11-27 NOTE — ED Triage Notes (Signed)
Pt presents for evaluation of near syncope today. Pt is AxO x4 at this time, was sitting at restaurant with family during event. Family reports hx of CVA with similar presentation. No focal weakness.

## 2017-12-02 DIAGNOSIS — H20022 Recurrent acute iridocyclitis, left eye: Secondary | ICD-10-CM | POA: Diagnosis not present

## 2017-12-02 DIAGNOSIS — H18413 Arcus senilis, bilateral: Secondary | ICD-10-CM | POA: Diagnosis not present

## 2017-12-02 DIAGNOSIS — H04123 Dry eye syndrome of bilateral lacrimal glands: Secondary | ICD-10-CM | POA: Diagnosis not present

## 2017-12-02 DIAGNOSIS — H353131 Nonexudative age-related macular degeneration, bilateral, early dry stage: Secondary | ICD-10-CM | POA: Diagnosis not present

## 2017-12-02 DIAGNOSIS — Z961 Presence of intraocular lens: Secondary | ICD-10-CM | POA: Diagnosis not present

## 2017-12-10 DIAGNOSIS — M170 Bilateral primary osteoarthritis of knee: Secondary | ICD-10-CM | POA: Diagnosis not present

## 2018-01-02 ENCOUNTER — Other Ambulatory Visit: Payer: Self-pay | Admitting: Family

## 2018-01-17 ENCOUNTER — Ambulatory Visit: Payer: Medicare Other | Admitting: Family

## 2018-01-21 ENCOUNTER — Ambulatory Visit (INDEPENDENT_AMBULATORY_CARE_PROVIDER_SITE_OTHER): Payer: Medicare Other | Admitting: Family

## 2018-01-21 ENCOUNTER — Encounter: Payer: Self-pay | Admitting: Family

## 2018-01-21 VITALS — BP 170/86 | HR 61 | Temp 98.6°F | Resp 16 | Ht 67.0 in | Wt 114.4 lb

## 2018-01-21 DIAGNOSIS — H579 Unspecified disorder of eye and adnexa: Secondary | ICD-10-CM

## 2018-01-21 DIAGNOSIS — R634 Abnormal weight loss: Secondary | ICD-10-CM | POA: Diagnosis not present

## 2018-01-21 DIAGNOSIS — R5383 Other fatigue: Secondary | ICD-10-CM

## 2018-01-21 DIAGNOSIS — M069 Rheumatoid arthritis, unspecified: Secondary | ICD-10-CM

## 2018-01-21 DIAGNOSIS — R269 Unspecified abnormalities of gait and mobility: Secondary | ICD-10-CM

## 2018-01-21 DIAGNOSIS — E039 Hypothyroidism, unspecified: Secondary | ICD-10-CM

## 2018-01-21 DIAGNOSIS — R55 Syncope and collapse: Secondary | ICD-10-CM | POA: Diagnosis not present

## 2018-01-21 LAB — CBC WITH DIFFERENTIAL/PLATELET
Basophils Absolute: 0 10*3/uL (ref 0.0–0.1)
Basophils Relative: 0.3 % (ref 0.0–3.0)
Eosinophils Absolute: 0 10*3/uL (ref 0.0–0.7)
Eosinophils Relative: 0.1 % (ref 0.0–5.0)
HCT: 38.8 % (ref 36.0–46.0)
Hemoglobin: 12.4 g/dL (ref 12.0–15.0)
Lymphocytes Relative: 13.5 % (ref 12.0–46.0)
Lymphs Abs: 1 10*3/uL (ref 0.7–4.0)
MCHC: 31.9 g/dL (ref 30.0–36.0)
MCV: 90.1 fl (ref 78.0–100.0)
Monocytes Absolute: 0.3 10*3/uL (ref 0.1–1.0)
Monocytes Relative: 4.8 % (ref 3.0–12.0)
Neutro Abs: 5.7 10*3/uL (ref 1.4–7.7)
Neutrophils Relative %: 81.3 % — ABNORMAL HIGH (ref 43.0–77.0)
Platelets: 315 10*3/uL (ref 150.0–400.0)
RBC: 4.31 Mil/uL (ref 3.87–5.11)
RDW: 15.3 % (ref 11.5–15.5)
WBC: 7.1 10*3/uL (ref 4.0–10.5)

## 2018-01-21 LAB — TSH: TSH: 0.12 u[IU]/mL — ABNORMAL LOW (ref 0.35–4.50)

## 2018-01-21 MED ORDER — LOSARTAN POTASSIUM 50 MG PO TABS: 50.0000 mg | ORAL_TABLET | Freq: Every day | ORAL | 3 refills | Status: DC

## 2018-01-21 NOTE — Progress Notes (Addendum)
Subjective:    [name], female    DOB: [birth year], 77 y.o.   MRN: [id number]  HPI  Patient is a 77 year old female who presents today for follow-up.  HTN-current medication for blood pressure includes metoprolol 100 mg once daily. BP Readings from Last 3 Encounters:  01/21/18 (!) 170/86  11/27/17 (!) 166/68  09/20/17 (!) 125/50   Hypothyroid-she is maintained on Synthroid 112 mcg once daily. Lab Results  Component Value Date   TSH 0.40 07/19/2017   Lab Results  Component Value Date   WBC 9.4 11/27/2017   HGB 13.0 11/27/2017   HCT 41.3 11/27/2017   MCV 91.0 11/27/2017   PLT 246 11/27/2017   Rheumatoid arthritis- maintained on weekly methotrexate, Areva.  She is also maintained on prednisone as needed.  This is managed by rheumatology. She is managed by Dr. Lenna Gilford.   Hyperlipidemia-maintained on atorvastatin 40 mg once daily.  Lab Results  Component Value Date   CHOL 203 (H) 05/07/2016   HDL 48.30 05/07/2016   LDLCALC 127 (H) 05/07/2016   TRIG 138.0 05/07/2016   CHOLHDL 4 05/07/2016   History of stroke-maintained on Plavix.   Of note she did have an episode of near syncope which occurred on January 19.  She was seen in the [facility] emergency department and the ER note is reviewed.  She underwent a CT of the head as well as abdomen and pelvis.  CT of the scan was unremarkable and her blood work was reassuring. The patient states that she had incontinence of her stool during this episode. Did not loose control of her bladder.   She is concerned about weight loss.  Reports that she did have some teeth pulled.  Reports her appetite is improved but she has to eat soft foods.  Reports that she feels off balance when she is walking. Denies dizziness.  Reports + fatigue. Wt Readings from Last 3 Encounters:  01/21/18 114 lb 6.4 oz (51.9 kg)  09/20/17 119 lb (54 kg)  08/23/17 122 lb (55.3 kg)   She reports that she saw her eye doctor for eye redness.  Was  given Dr. Thurston Hole   Review of Systems See HPI  Past Medical History:  Diagnosis Date  . Allergy   . Anemia    iron deficiency  . 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 Cataract   . Colon polyps   . CVA (cerebral vascular accident) ([place]) 02/2017  . Depression   . Hyperlipidemia   . Hypertension   . Osteoporosis 01/01/2015  . Rheumatoid arteritis   . Thyroid disease   . TIA (transient ischemic attack) 2016  . Urinary incontinence      Social History   Socioeconomic History  . Marital status: Divorced    Spouse name: Not on file  . Number of children: 8  . Years of education: Not on file  . Highest education level: Not on file  Social Needs  . Financial resource strain: Not on file  . Food insecurity - worry: Not on file  . Food insecurity - inability: Not on file  . Transportation needs - medical: Not on file  . Transportation needs - non-medical: Not on file  Occupational History    Employer: RETIRED    Comment: Retired from [employer]  . Smoking status: Current Some Day Smoker    Packs/day: 0.25    Years: 32.00    Pack years: 8.00  Types: Cigarettes  . Smokeless tobacco: Never Used  Substance and Sexual Activity  . Alcohol use: No    Alcohol/week: 0.0 oz  . Drug use: No  . Sexual activity: No  Other Topics Concern  . Not on file  Social History Narrative  . Not on file    Past Surgical History:  Procedure Laterality Date  . ABDOMINAL HYSTERECTOMY  1976  . CATARACT EXTRACTION Bilateral   . 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 Hypertension Father   . Colon cancer Father   . Ulcerative colitis Mother   . Parkinson's disease Mother   . Heart disease Sister 49       CABG x 3  . Cirrhosis Brother   . Alcohol abuse Brother   . Colitis Daughter   . Colon polyps Daughter   . Heart attack Daughter   . Hypertension Son   . Kidney disease Son        transplant due  to kidney problems after [facility]  . Arthritis Other   . Coronary artery disease Other   . Hyperlipidemia Other   . Hypertension Other   . Stroke Sister        died 87  . Arthritis Sister   . Hypertension Sister   . Thyroid disease Daughter        x 3  . Cirrhosis Brother 65       ETOH abuse    Allergies  Allergen Reactions  . Amlodipine Other (See Comments)    dizziness  . Iron Nausea And Vomiting    Can take infusions     Current Outpatient Medications on File Prior to Visit  Medication Sig Dispense Refill  . atorvastatin (LIPITOR) 40 MG tablet Take 1 tablet (40 mg total) by mouth daily. 30 tablet 5  . clopidogrel (PLAVIX) 75 MG tablet TAKE 1 TABLET BY MOUTH EVERY DAY 30 tablet 5  . Docusate Calcium (STOOL SOFTENER PO) Take 1 tablet by mouth daily.    Marland Kitchen escitalopram (LEXAPRO) 5 MG tablet Take 1 tablet (5 mg total) by mouth daily. 90 tablet 1  . folic acid (FOLVITE) 1 MG tablet Take 1 mg by mouth daily.     Marland Kitchen leflunomide (ARAVA) 10 MG tablet Take 10 mg by mouth daily.  3  . levothyroxine (SYNTHROID, LEVOTHROID) 112 MCG tablet Take 1 tablet (112 mcg total) by mouth daily. 9030 tablet 3  . loratadine (CLARITIN) 10 MG tablet Take 10 mg by mouth daily as needed. Reported on 10/23/2015    . methotrexate (RHEUMATREX) 2.5 MG tablet Take 20 mg by mouth once a week.   3  . metoprolol succinate (TOPROL-XL) 100 MG 24 hr tablet TAKE 1 TABLE BY MOUTH DAILY. TAKE WITH OR IMMEDIATELY FOLLOWING A MEAL. 90 tablet 1  . predniSONE (DELTASONE) 5 MG tablet Take 5 mg by mouth as needed.    . fluticasone (FLONASE) 50 MCG/ACT nasal spray Place 2 sprays into both nostrils daily. 16 g 5   No current facility-administered medications on file prior to visit.     BP (!) 170/86 (BP Location: Right Arm, Patient Position: Sitting, Cuff Size: Small)   Pulse 61   Temp 98.6 F (37 C) (Oral)   Resp 16   Ht 5\' 7"  (1.702 m)   Wt 114 lb 6.4 oz (51.9 kg)   SpO2 100%   BMI 17.92 kg/m  Objective:   Physical Exam  Constitutional: She appears well-developed and well-nourished.  HENT:  Head: Normocephalic.  Eyes: EOM are normal.  Left sclera injected superior to iris  Cardiovascular: Normal rate, regular rhythm and normal heart sounds.  No murmur heard. Pulmonary/Chest: Effort normal and breath sounds normal. No respiratory distress. She has no wheezes.  Neurological:  Generalized weakness is noted.  Bilateral upper and lower extremity strength is equal.  Speech is clear.  Positive facial symmetry is noted. She has an unsteady narrow based gait  Psychiatric: She has a normal mood and affect. Her behavior is normal. Judgment and thought content normal.          Assessment & Plan:  Gait disorder-will  order home health physical therapy.  Refer back to neurology.  History of stroke-continue Plavix blood pressure control and statin therapy.  Refer back to neurology.  Near syncopal event-she did have associated bowel incontinence during this episode.  I am concerned about the possibility of seizure.  Will refer back to neurology.  Hypertension-uncontrolled.  Continue current dose of metoprolol.  Add losartan 50 mg once daily.  Plan to repeat blood pressure and basic metabolic panel in 3 weeks.  Weight loss-I suspect that this is related to her recent dental issues.  I have asked her to continue boost which she reports she has been drinking in place of meals.  I have advised her to drink them after each meal rather than as a meal replacement and to incorporate 2 healthy snacks a day.  Hypothyroid-we will check follow-up TSH.  Continue Synthroid.  Fatigue-may be related to rheumatoid arthritis.  Check CBC to rule out anemia.  Rheumatoid arthritis- symptoms stable.  Management per rheumatology.  Eye problem-she reports that she had significant eye pain for which she went to her optometrist.  He gave her an ophthalmic steroid.  She reports the pain has improved however  she still has some associated redness.  I am concerned about the possibility of uveitis.  I will refer her to ophthalmology for further evaluation

## 2018-01-21 NOTE — Patient Instructions (Addendum)
Use rolling walker to help with balance and prevent falls. You will be contacted about home health Physical therapy. Drink 2 cans of boost a day along with our 3 meals and 2 snacks.   Continue metoprolol, add losartan once daily for blood pressure.   You should be contacted about your referral to neurology.

## 2018-01-23 ENCOUNTER — Telehealth: Payer: Self-pay | Admitting: Family

## 2018-01-23 DIAGNOSIS — E039 Hypothyroidism, unspecified: Secondary | ICD-10-CM

## 2018-01-23 MED ORDER — LEVOTHYROXINE SODIUM 100 MCG PO TABS: 100.0000 ug | ORAL_TABLET | Freq: Every day | ORAL | 2 refills | Status: DC

## 2018-01-23 NOTE — Telephone Encounter (Signed)
Please advise pt synthroid dose is too strong. Decrease to 185mcg, repeat tsh in 6 weeks.

## 2018-01-24 DIAGNOSIS — H04123 Dry eye syndrome of bilateral lacrimal glands: Secondary | ICD-10-CM | POA: Diagnosis not present

## 2018-01-24 DIAGNOSIS — H15102 Unspecified episcleritis, left eye: Secondary | ICD-10-CM | POA: Diagnosis not present

## 2018-01-24 DIAGNOSIS — H20022 Recurrent acute iridocyclitis, left eye: Secondary | ICD-10-CM | POA: Diagnosis not present

## 2018-01-24 NOTE — Telephone Encounter (Signed)
Notified pt and she voices understanding. Lab appt scheduled for 03/07/18 at 1:30pm and future order has been entered.

## 2018-01-26 DIAGNOSIS — Z7952 Long term (current) use of systemic steroids: Secondary | ICD-10-CM | POA: Diagnosis not present

## 2018-01-26 DIAGNOSIS — R55 Syncope and collapse: Secondary | ICD-10-CM | POA: Diagnosis not present

## 2018-01-26 DIAGNOSIS — M069 Rheumatoid arthritis, unspecified: Secondary | ICD-10-CM | POA: Diagnosis not present

## 2018-01-26 DIAGNOSIS — R2689 Other abnormalities of gait and mobility: Secondary | ICD-10-CM | POA: Diagnosis not present

## 2018-01-26 DIAGNOSIS — Z720 Tobacco use: Secondary | ICD-10-CM | POA: Diagnosis not present

## 2018-01-26 DIAGNOSIS — Z8673 Personal history of transient ischemic attack (TIA), and cerebral infarction without residual deficits: Secondary | ICD-10-CM | POA: Diagnosis not present

## 2018-01-26 DIAGNOSIS — D509 Iron deficiency anemia, unspecified: Secondary | ICD-10-CM | POA: Diagnosis not present

## 2018-01-26 DIAGNOSIS — Z7902 Long term (current) use of antithrombotics/antiplatelets: Secondary | ICD-10-CM | POA: Diagnosis not present

## 2018-01-26 DIAGNOSIS — E039 Hypothyroidism, unspecified: Secondary | ICD-10-CM | POA: Diagnosis not present

## 2018-01-26 DIAGNOSIS — I10 Essential (primary) hypertension: Secondary | ICD-10-CM | POA: Diagnosis not present

## 2018-01-27 ENCOUNTER — Telehealth: Payer: Self-pay | Admitting: Family

## 2018-01-27 NOTE — Telephone Encounter (Signed)
Copied from [place]. Topic: Quick Communication - See Telephone Encounter >> Jan 27, 2018 10:25 AM Conception Chancy, NT wrote: CRM for notification. See Telephone encounter for: 01/27/18.  Nancy [name] is calling from [facility] and is requesting verbal orders for PT 1x a week and 2x a week for 2 weeks. Also that when she did the start of care, that patient has not been taking atorvastatin (LIPITOR) 40 MG tablet   she thought with her diet she did not need to take it anymore. Patient told her she would talk to the doctor about it when she came in for her appt in 2 weeks.   CB# [phone]

## 2018-01-27 NOTE — Telephone Encounter (Signed)
FYI. Please advise.

## 2018-01-27 NOTE — Telephone Encounter (Signed)
OK to give order for PT.

## 2018-01-27 NOTE — Telephone Encounter (Signed)
1x a week for 1 week then 2x a week for 2 weeks.

## 2018-02-01 DIAGNOSIS — D509 Iron deficiency anemia, unspecified: Secondary | ICD-10-CM | POA: Diagnosis not present

## 2018-02-01 DIAGNOSIS — Z720 Tobacco use: Secondary | ICD-10-CM | POA: Diagnosis not present

## 2018-02-01 DIAGNOSIS — Z8673 Personal history of transient ischemic attack (TIA), and cerebral infarction without residual deficits: Secondary | ICD-10-CM | POA: Diagnosis not present

## 2018-02-01 DIAGNOSIS — E039 Hypothyroidism, unspecified: Secondary | ICD-10-CM | POA: Diagnosis not present

## 2018-02-01 DIAGNOSIS — I10 Essential (primary) hypertension: Secondary | ICD-10-CM | POA: Diagnosis not present

## 2018-02-01 DIAGNOSIS — R55 Syncope and collapse: Secondary | ICD-10-CM | POA: Diagnosis not present

## 2018-02-01 DIAGNOSIS — Z7952 Long term (current) use of systemic steroids: Secondary | ICD-10-CM | POA: Diagnosis not present

## 2018-02-01 DIAGNOSIS — R2689 Other abnormalities of gait and mobility: Secondary | ICD-10-CM | POA: Diagnosis not present

## 2018-02-01 DIAGNOSIS — Z7902 Long term (current) use of antithrombotics/antiplatelets: Secondary | ICD-10-CM | POA: Diagnosis not present

## 2018-02-01 DIAGNOSIS — M069 Rheumatoid arthritis, unspecified: Secondary | ICD-10-CM | POA: Diagnosis not present

## 2018-02-04 DIAGNOSIS — Z8673 Personal history of transient ischemic attack (TIA), and cerebral infarction without residual deficits: Secondary | ICD-10-CM | POA: Diagnosis not present

## 2018-02-04 DIAGNOSIS — R2689 Other abnormalities of gait and mobility: Secondary | ICD-10-CM | POA: Diagnosis not present

## 2018-02-04 DIAGNOSIS — D509 Iron deficiency anemia, unspecified: Secondary | ICD-10-CM | POA: Diagnosis not present

## 2018-02-04 DIAGNOSIS — E039 Hypothyroidism, unspecified: Secondary | ICD-10-CM | POA: Diagnosis not present

## 2018-02-04 DIAGNOSIS — M069 Rheumatoid arthritis, unspecified: Secondary | ICD-10-CM | POA: Diagnosis not present

## 2018-02-04 DIAGNOSIS — R55 Syncope and collapse: Secondary | ICD-10-CM | POA: Diagnosis not present

## 2018-02-04 DIAGNOSIS — Z7952 Long term (current) use of systemic steroids: Secondary | ICD-10-CM | POA: Diagnosis not present

## 2018-02-04 DIAGNOSIS — I10 Essential (primary) hypertension: Secondary | ICD-10-CM | POA: Diagnosis not present

## 2018-02-04 DIAGNOSIS — Z720 Tobacco use: Secondary | ICD-10-CM | POA: Diagnosis not present

## 2018-02-04 DIAGNOSIS — Z7902 Long term (current) use of antithrombotics/antiplatelets: Secondary | ICD-10-CM | POA: Diagnosis not present

## 2018-02-08 DIAGNOSIS — E039 Hypothyroidism, unspecified: Secondary | ICD-10-CM | POA: Diagnosis not present

## 2018-02-08 DIAGNOSIS — Z7902 Long term (current) use of antithrombotics/antiplatelets: Secondary | ICD-10-CM | POA: Diagnosis not present

## 2018-02-08 DIAGNOSIS — Z7952 Long term (current) use of systemic steroids: Secondary | ICD-10-CM | POA: Diagnosis not present

## 2018-02-08 DIAGNOSIS — M069 Rheumatoid arthritis, unspecified: Secondary | ICD-10-CM | POA: Diagnosis not present

## 2018-02-08 DIAGNOSIS — I10 Essential (primary) hypertension: Secondary | ICD-10-CM | POA: Diagnosis not present

## 2018-02-08 DIAGNOSIS — Z8673 Personal history of transient ischemic attack (TIA), and cerebral infarction without residual deficits: Secondary | ICD-10-CM | POA: Diagnosis not present

## 2018-02-08 DIAGNOSIS — R2689 Other abnormalities of gait and mobility: Secondary | ICD-10-CM | POA: Diagnosis not present

## 2018-02-08 DIAGNOSIS — Z720 Tobacco use: Secondary | ICD-10-CM | POA: Diagnosis not present

## 2018-02-08 DIAGNOSIS — R55 Syncope and collapse: Secondary | ICD-10-CM | POA: Diagnosis not present

## 2018-02-08 DIAGNOSIS — D509 Iron deficiency anemia, unspecified: Secondary | ICD-10-CM | POA: Diagnosis not present

## 2018-02-10 DIAGNOSIS — M069 Rheumatoid arthritis, unspecified: Secondary | ICD-10-CM | POA: Diagnosis not present

## 2018-02-10 DIAGNOSIS — D509 Iron deficiency anemia, unspecified: Secondary | ICD-10-CM | POA: Diagnosis not present

## 2018-02-10 DIAGNOSIS — E039 Hypothyroidism, unspecified: Secondary | ICD-10-CM | POA: Diagnosis not present

## 2018-02-10 DIAGNOSIS — Z720 Tobacco use: Secondary | ICD-10-CM | POA: Diagnosis not present

## 2018-02-10 DIAGNOSIS — I10 Essential (primary) hypertension: Secondary | ICD-10-CM | POA: Diagnosis not present

## 2018-02-10 DIAGNOSIS — Z7902 Long term (current) use of antithrombotics/antiplatelets: Secondary | ICD-10-CM | POA: Diagnosis not present

## 2018-02-10 DIAGNOSIS — R2689 Other abnormalities of gait and mobility: Secondary | ICD-10-CM | POA: Diagnosis not present

## 2018-02-10 DIAGNOSIS — R55 Syncope and collapse: Secondary | ICD-10-CM | POA: Diagnosis not present

## 2018-02-10 DIAGNOSIS — Z8673 Personal history of transient ischemic attack (TIA), and cerebral infarction without residual deficits: Secondary | ICD-10-CM | POA: Diagnosis not present

## 2018-02-10 DIAGNOSIS — Z7952 Long term (current) use of systemic steroids: Secondary | ICD-10-CM | POA: Diagnosis not present

## 2018-02-11 ENCOUNTER — Encounter: Payer: Self-pay | Admitting: Family

## 2018-02-11 ENCOUNTER — Telehealth: Payer: Self-pay | Admitting: Family

## 2018-02-11 ENCOUNTER — Ambulatory Visit (INDEPENDENT_AMBULATORY_CARE_PROVIDER_SITE_OTHER): Payer: Medicare Other | Admitting: Family

## 2018-02-11 VITALS — BP 183/58 | HR 66 | Resp 16 | Ht 67.0 in | Wt 114.4 lb

## 2018-02-11 DIAGNOSIS — E039 Hypothyroidism, unspecified: Secondary | ICD-10-CM

## 2018-02-11 DIAGNOSIS — I10 Essential (primary) hypertension: Secondary | ICD-10-CM

## 2018-02-11 DIAGNOSIS — R55 Syncope and collapse: Secondary | ICD-10-CM

## 2018-02-11 DIAGNOSIS — H5713 Ocular pain, bilateral: Secondary | ICD-10-CM

## 2018-02-11 MED ORDER — LOSARTAN POTASSIUM 100 MG PO TABS: 100.0000 mg | ORAL_TABLET | Freq: Every day | ORAL | 3 refills | Status: DC

## 2018-02-11 NOTE — Patient Instructions (Signed)
Please increase your losartan from 50mg  to 100mg .

## 2018-02-11 NOTE — Telephone Encounter (Signed)
Patient was seen at Dr. Payton Emerald office (ophthalmology), can you please request consult notes?

## 2018-02-11 NOTE — Progress Notes (Signed)
Subjective:    [name], female    DOB: December 28, 1940, 77 y.o.   MRN: [id number]  HPI  Nancy [name] is a 77 yr old female who presents today for follow up. Last visit we evaluated her following a near syncopal episode.  We referred her to neurology for evaluation of possible seizure. She denies any further near syncope. Has appointment in May with neurology.   HTN- She is maintained on losartan 50mg  and Toprol xl 100mg  once daily  BP Readings from Last 3 Encounters:  02/11/18 (!) 183/58  01/21/18 (!) 170/86  11/27/17 (!) 166/68     Eye pain-  Was referred to ophthalmology.   Weight loss. TSH was depressed 3 weeks ago and her synthroid was decreased.   Lab Results  Component Value Date   TSH 0.12 (L) 01/21/2018    Wt Readings from Last 3 Encounters:  02/11/18 114 lb 6.4 oz (51.9 kg)  01/21/18 114 lb 6.4 oz (51.9 kg)  09/20/17 119 lb (54 kg)    Review of Systems    see HPI  Past Medical History:  Diagnosis Date  . Allergy   . Anemia    iron deficiency  . Cancer ([place]) 1995   vaginal  . Cataract   . Colon polyps   . CVA (cerebral vascular accident) ([place]) 02/2017  . Depression   . Hyperlipidemia   . Hypertension   . Osteoporosis 01/01/2015  . Rheumatoid arteritis   . Thyroid disease   . TIA (transient ischemic attack) 2016  . Urinary incontinence      Social History   Socioeconomic History  . Marital status: Divorced    Spouse name: Not on file  . Number of children: 8  . Years of education: Not on file  . Highest education level: Not on file  Occupational History    Employer: RETIRED    Comment: Retired from [employer]  . Financial resource strain: Not on file  . Food insecurity:    Worry: Not on file    Inability: Not on file  . Transportation needs:    Medical: Not on file    Non-medical: Not on file  Tobacco Use  . Smoking status: Current Some Day Smoker    Packs/day: 0.25    Years: 32.00    Pack years: 8.00     Types: Cigarettes  . Smokeless tobacco: Never Used  Substance and Sexual Activity  . Alcohol use: No    Alcohol/week: 0.0 oz  . Drug use: No  . Sexual activity: Never  Lifestyle  . Physical activity:    Days per week: Not on file    Minutes per session: Not on file  . Stress: Not on file  Relationships  . Social connections:    Talks on phone: Not on file    Gets together: Not on file    Attends religious service: Not on file    Active member of club or organization: Not on file    Attends meetings of clubs or organizations: Not on file    Relationship status: Not on file  . Intimate partner violence:    Fear of current or ex partner: Not on file    Emotionally abused: Not on file    Physically abused: Not on file    Forced sexual activity: Not on file  Other Topics Concern  . Not on file  Social History Narrative  . Not on file  Past Surgical History:  Procedure Laterality Date  . ABDOMINAL HYSTERECTOMY  1976  . CATARACT EXTRACTION Bilateral   . 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 Hypertension Father   . Colon cancer Father   . Ulcerative colitis Mother   . Parkinson's disease Mother   . Heart disease Sister 74       CABG x 3  . Cirrhosis Brother   . Alcohol abuse Brother   . Colitis Daughter   . Colon polyps Daughter   . Heart attack Daughter   . Hypertension Son   . Kidney disease Son        transplant due to kidney problems after [facility]  . Arthritis Other   . Coronary artery disease Other   . Hyperlipidemia Other   . Hypertension Other   . Stroke Sister        died 63  . Arthritis Sister   . Hypertension Sister   . Thyroid disease Daughter        x 3  . Cirrhosis Brother 65       ETOH abuse    Allergies  Allergen Reactions  . Amlodipine Other (See Comments)    dizziness  . Iron Nausea And Vomiting    Can take infusions     Current Outpatient Medications on File  Prior to Visit  Medication Sig Dispense Refill  . atorvastatin (LIPITOR) 40 MG tablet Take 1 tablet (40 mg total) by mouth daily. 30 tablet 5  . clopidogrel (PLAVIX) 75 MG tablet TAKE 1 TABLET BY MOUTH EVERY DAY 30 tablet 5  . Docusate Calcium (STOOL SOFTENER PO) Take 1 tablet by mouth daily.    Marland Kitchen escitalopram (LEXAPRO) 5 MG tablet Take 1 tablet (5 mg total) by mouth daily. 90 tablet 1  . folic acid (FOLVITE) 1 MG tablet Take 1 mg by mouth daily.     Marland Kitchen leflunomide (ARAVA) 10 MG tablet Take 10 mg by mouth daily.  3  . levothyroxine (SYNTHROID, LEVOTHROID) 100 MCG tablet Take 1 tablet (100 mcg total) by mouth daily. 30 tablet 2  . loratadine (CLARITIN) 10 MG tablet Take 10 mg by mouth daily as needed. Reported on 10/23/2015    . losartan (COZAAR) 50 MG tablet Take 1 tablet (50 mg total) by mouth daily. 30 tablet 3  . methotrexate (RHEUMATREX) 2.5 MG tablet Take 20 mg by mouth once a week.   3  . metoprolol succinate (TOPROL-XL) 100 MG 24 hr tablet TAKE 1 TABLE BY MOUTH DAILY. TAKE WITH OR IMMEDIATELY FOLLOWING A MEAL. 90 tablet 1  . predniSONE (DELTASONE) 5 MG tablet Take 5 mg by mouth as needed.    . fluticasone (FLONASE) 50 MCG/ACT nasal spray Place 2 sprays into both nostrils daily. 16 g 5   No current facility-administered medications on file prior to visit.     BP (!) 183/58 (BP Location: Right Arm, Patient Position: Sitting, Cuff Size: Normal)   Pulse 66   Resp 16   Ht 5\' 7"  (1.702 m)   Wt 114 lb 6.4 oz (51.9 kg)   SpO2 100%   BMI 17.92 kg/m    Objective:   Physical Exam  Constitutional: She is oriented to person, place, and time. She appears well-developed and well-nourished.  Cardiovascular: Normal rate, regular rhythm and normal heart sounds.  No murmur heard. Pulmonary/Chest: Effort normal and breath sounds normal. No respiratory distress.  She has no wheezes.  Musculoskeletal: She exhibits no edema.  Joint deformities bilateral hands.   Neurological: She is alert and  oriented to person, place, and time.  Skin: Skin is warm and dry.  Psychiatric: She has a normal mood and affect. Her behavior is normal. Judgment and thought content normal.          Assessment & Plan:  HTN- uncontrolled.  Increase losartan from 50mg  to 100mg . Continue current dose of toprol.   Eye pain- improved. Will request records from ophthalmology.  Hypothyroid- on decreased dose, weight stable.  Plan follow up tsh in 2 weeks.   Near syncopal episode- no further episodes, await neuro evaluation.

## 2018-02-14 NOTE — Telephone Encounter (Signed)
Left message on voicemail at [facility] office to fax consult notes or call and let us know if not able to fax records. Awaiting records.

## 2018-02-15 DIAGNOSIS — M2550 Pain in unspecified joint: Secondary | ICD-10-CM | POA: Diagnosis not present

## 2018-02-15 DIAGNOSIS — R5383 Other fatigue: Secondary | ICD-10-CM | POA: Diagnosis not present

## 2018-02-15 DIAGNOSIS — M0579 Rheumatoid arthritis with rheumatoid factor of multiple sites without organ or systems involvement: Secondary | ICD-10-CM | POA: Diagnosis not present

## 2018-02-15 DIAGNOSIS — M170 Bilateral primary osteoarthritis of knee: Secondary | ICD-10-CM | POA: Diagnosis not present

## 2018-02-15 DIAGNOSIS — Z79899 Other long term (current) drug therapy: Secondary | ICD-10-CM | POA: Diagnosis not present

## 2018-02-16 ENCOUNTER — Other Ambulatory Visit: Payer: Self-pay | Admitting: Family

## 2018-02-16 NOTE — Telephone Encounter (Signed)
Office note received and placed in PCP's yellow folder for review.

## 2018-02-24 ENCOUNTER — Ambulatory Visit (INDEPENDENT_AMBULATORY_CARE_PROVIDER_SITE_OTHER): Payer: Medicare Other | Admitting: Family Medicine

## 2018-02-24 ENCOUNTER — Encounter: Payer: Self-pay | Admitting: Family Medicine

## 2018-02-24 ENCOUNTER — Other Ambulatory Visit (INDEPENDENT_AMBULATORY_CARE_PROVIDER_SITE_OTHER): Payer: Medicare Other

## 2018-02-24 VITALS — BP 109/58 | HR 62

## 2018-02-24 VITALS — BP 150/70 | HR 76 | Wt 114.4 lb

## 2018-02-24 DIAGNOSIS — I10 Essential (primary) hypertension: Secondary | ICD-10-CM

## 2018-02-24 DIAGNOSIS — M7581 Other shoulder lesions, right shoulder: Secondary | ICD-10-CM | POA: Diagnosis not present

## 2018-02-24 DIAGNOSIS — E039 Hypothyroidism, unspecified: Secondary | ICD-10-CM | POA: Diagnosis not present

## 2018-02-24 LAB — BASIC METABOLIC PANEL
BUN: 11 mg/dL (ref 6–23)
CO2: 26 mEq/L (ref 19–32)
Calcium: 9.2 mg/dL (ref 8.4–10.5)
Chloride: 101 mEq/L (ref 96–112)
Creatinine, Ser: 0.8 mg/dL (ref 0.40–1.20)
GFR: 89.46 mL/min (ref >60.00)
Glucose, Bld: 87 mg/dL (ref 70–99)
Potassium: 3.6 mEq/L (ref 3.5–5.1)
Sodium: 134 mEq/L — ABNORMAL LOW (ref 135–145)

## 2018-02-24 LAB — TSH: TSH: 1.76 u[IU]/mL (ref 0.35–4.50)

## 2018-02-24 NOTE — Progress Notes (Signed)
   Subjective:    [name], female    DOB: [birth year], 77 y.o.   MRN: [id number] Chief Complaint  Patient presents with  . Shoulder Pain    HPI Nancy [name] is a 77 y.o. female with a history of RA, HTN, HLD and Hypothyroidism here today with complaint of R shoulder pain.  Pain began about 6 days ago.  She denies any injury or known overuse.  She has had similar pain in the past that was relieved with shoulder injection.  She tells me that pain is worse with movement and/or if she sleeps on her R shoulder.  She is followed by a rheumatologist (Dr. Lenna Gilford) for her RA and is compliant with prescribed medications.  She did try increasing her prednisone to 10mg  for a couple of days but did not notice much of a difference.  She does not take NSAIDS but uses tylenol arthritis.  She denies symptoms of swelling, numbness, tingling or weakness into the arm.   Review of Systems ROS per HPI, otherwise negative.    Objective:   Physical Exam  Constitutional: She is oriented to person, place, and time. She appears well-nourished. No distress.  HENT:  Head: Normocephalic and atraumatic.  Mouth/Throat: Oropharynx is clear and moist.  Eyes: No scleral icterus.  Neck: Normal range of motion.  Cardiovascular: Normal rate, regular rhythm and normal heart sounds.  Pulmonary/Chest: Effort normal and breath sounds normal.  Musculoskeletal:  R shoulder normal to inspection and palpation.  Limited rom with internal rotation, abduction and overhead movement.  Strength is 5/5 throughout, negative drop arm test.  + Hawkins test and empty can.    Neurological: She is alert and oriented to person, place, and time.  Psychiatric: She has a normal mood and affect. Her behavior is normal.    Procedure note:  Procedure explained to patient in detail outlining potential complications and adverse reactions.  All question were answered and consent signed.  Time out was completed and she was prepped in  typical sterile fashion.  Ethyl chloride spray was applied to the skin and the R subacromial space was injected with 71mL of 40mg /mL methylprednisolone and 3 mL of 1% lidocaine using a posterior approach.  She tolerated procedure well without any immediate side effects.  Bandaid was applied to skin and post procedure instructions were given.        Assessment & Plan:  Tendinitis of right rotator cuff May be related to her RA as she is off methotrexate currently.  Discussed treatment options including trial of increase in PO prednisone for a short period of time vs injection.  Recommend that she avoid NSAIDS given advanced age with plavix use.  She elected to have shoulder injection today which she tolerated well.  See procedure note for details of procedure.

## 2018-02-24 NOTE — Patient Instructions (Signed)
Rest the arm today It may take a couple of days to feel the effect of the steroid If you notice increased pain, redness, fever or chills let us know

## 2018-02-24 NOTE — Patient Instructions (Signed)
BP looks good! Continue taking same medications. Follow-up with Melissa as recommended.

## 2018-02-24 NOTE — Progress Notes (Signed)
Noted. Agree with above.  [place], DO 02/24/18 11:52 AM

## 2018-02-24 NOTE — Progress Notes (Signed)
Pre visit review using our clinic review tool, if applicable. No additional management support is needed unless otherwise documented below in the visit note.  Pt here today for BP check. At [place] w/ Melissa on 02/11/2018 losartan increased to 100mg . Pt also on Toprol-XL 100mg  daily. Pt needing labs.   BP today in L arm: 109/58 Pulse: 62  Per Dr. Nani Ravens: Continue same medications, follow-up w/ Melissa as recommended.   Pt verbalized understanding.

## 2018-02-24 NOTE — Assessment & Plan Note (Addendum)
May be related to her RA as she is off methotrexate currently.  Discussed treatment options including trial of increase in PO prednisone for a short period of time vs injection.  Recommend that she avoid NSAIDS given advanced age with plavix use.  She elected to have shoulder injection today which she tolerated well.  See procedure note for details of procedure.

## 2018-02-28 ENCOUNTER — Other Ambulatory Visit: Payer: Self-pay

## 2018-02-28 DIAGNOSIS — E871 Hypo-osmolality and hyponatremia: Secondary | ICD-10-CM

## 2018-03-07 ENCOUNTER — Other Ambulatory Visit: Payer: Medicare Other

## 2018-03-11 ENCOUNTER — Telehealth: Payer: Self-pay | Admitting: *Deleted

## 2018-03-11 NOTE — Telephone Encounter (Signed)
Received Home Health Certification and Plan of Care; forwarded to provider/SLS 05/03  

## 2018-03-14 ENCOUNTER — Other Ambulatory Visit (INDEPENDENT_AMBULATORY_CARE_PROVIDER_SITE_OTHER): Payer: Medicare Other

## 2018-03-14 DIAGNOSIS — E871 Hypo-osmolality and hyponatremia: Secondary | ICD-10-CM | POA: Diagnosis not present

## 2018-03-14 LAB — BASIC METABOLIC PANEL
BUN: 14 mg/dL (ref 6–23)
CO2: 27 mEq/L (ref 19–32)
Calcium: 9.3 mg/dL (ref 8.4–10.5)
Chloride: 106 mEq/L (ref 96–112)
Creatinine, Ser: 0.71 mg/dL (ref 0.40–1.20)
GFR: 102.66 mL/min (ref >60.00)
Glucose, Bld: 78 mg/dL (ref 70–99)
Potassium: 4.1 mEq/L (ref 3.5–5.1)
Sodium: 141 mEq/L (ref 135–145)

## 2018-03-16 ENCOUNTER — Telehealth: Payer: Self-pay | Admitting: *Deleted

## 2018-03-16 NOTE — Telephone Encounter (Signed)
Received Home Health Certification and Plan of Care; forwarded to provider/SLS 05/08  

## 2018-03-29 ENCOUNTER — Other Ambulatory Visit: Payer: Self-pay | Admitting: Family

## 2018-04-08 ENCOUNTER — Telehealth: Payer: Self-pay | Admitting: Family

## 2018-04-08 MED ORDER — DENOSUMAB 60 MG/ML ~~LOC~~ SOSY: 60.0000 mg | PREFILLED_SYRINGE | Freq: Once | SUBCUTANEOUS | 0 refills | Status: AC

## 2018-04-08 NOTE — Telephone Encounter (Signed)
Prolia benefits received PA not required Patient has Medicaid secondary and Prolia should be picked up from the pharmacy  **call patient Prolia to pharmacy**   Patient may owe approximately $0 OOP  Patient due after 04/13/18  Letter mailed to inform patient of benefits and to schedule

## 2018-04-08 NOTE — Telephone Encounter (Signed)
Notified pt of below and she scheduled nurse visit for 04/15/18 at 2:45pm. Rx sent to pharmacy today.

## 2018-04-11 ENCOUNTER — Ambulatory Visit (INDEPENDENT_AMBULATORY_CARE_PROVIDER_SITE_OTHER): Payer: Medicare Other | Admitting: Neurology

## 2018-04-11 ENCOUNTER — Encounter: Payer: Self-pay | Admitting: Neurology

## 2018-04-11 VITALS — BP 156/80 | HR 72 | Ht 67.0 in | Wt 112.8 lb

## 2018-04-11 DIAGNOSIS — Z79899 Other long term (current) drug therapy: Secondary | ICD-10-CM | POA: Diagnosis not present

## 2018-04-11 DIAGNOSIS — R55 Syncope and collapse: Secondary | ICD-10-CM | POA: Diagnosis not present

## 2018-04-11 NOTE — Patient Instructions (Signed)
I had a long d/w patient about her remote TIAs and syncopal episodes, risk for recurrent stroke/TIAs, personally independently reviewed imaging studies and stroke evaluation results and answered questions.Continue Plavix for secondary stroke prevention and maintain strict control of hypertension with blood pressure goal below 130/90, diabetes with hemoglobin A1c goal below 6.5% and lipids with LDL cholesterol goal below 70 mg/dL. I also advised the patient to eat a healthy diet with plenty of whole grains, cereals, fruits and vegetables, exercise regularly and maintain ideal body weight . Check f/u lipids and HbA1c.maintain adequate hydration.Followup with primary MD for weight loss and in the future with me only as necessary

## 2018-04-11 NOTE — Progress Notes (Signed)
[facility] [address] [place]. [place] [address] [phone]       OFFICE FOLLOW-UP NOTE  [name] Date of Birth:  1941-02-08 Medical Record Number:  [id number]   HPI: Last office visit 05/15/2015 : 41 year is an elderly lady seen today for first office follow-up visit following admission for TIA on 12/16/14. She presented with the transient speech output difficulties as well as confusion followed by some tremulousness. Symptoms lasted around 30-45 minutes and started resolving by the time she reached the hospital. On arrival she had no focal deficits. MRI scan of the brain showed no acute infarct and changes of small vessel disease. 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 Hemoglobin A1c was 6.6. Transthoracic echo was unremarkable. Carotid ultrasound showed no significant extra-axial stenosis. LDL cholesterol was elevated at 121. The episode was felt to also possibly represent a seizure and EEG was obtained which was normal. She started on aspirin which is tolerating well without bleeding or bruising. She today and found that she had another episode 3 years ago while in church she was walking outside she felt weak all over and she lost her voice and had to sit down for a little while. She was seen at [facility] where blood pressure was found to be significantly elevated. Last year in September she had only episode where she felt disoriented and confused and nauseous but did not pass out. Patient has not been evaluated with cardiac event monitor for arrhythmias yet. She remains on Lipitor she is tolerating well without side effects. She is also on aspirin without bleeding or bruising. Update 04/11/18 : She returns for follow-up after last visit with me nearly 3 years ago.  She states she has had no definite recurrent TIA or strokelike symptoms.  She remains on Plavix which is tolerating well without bruising  or bleeding.  She states her blood pressure is usually well controlled though today it is elevated in office slightly.  She has been increasing salt intake in her food and states her primary care physician asked her to do so.  She did have some lab work last month which showed low sodium of 134.  TSH was suppressed for which her Synthroid has been adjusted.  She complains of decreased appetite and weight loss but plans to discuss this with her primary physician.  She is tolerating Lipitor well without muscle aches and pains but cannot tell me when the last time her lipid profile was checked.  She did have an episode of syncope in January.  She was visiting a restaurant with her daughter and feels that she passed out.  She was incontinent of bowel.  She is previously had work-up for her syncopal events in 2016 and she had an EEG as well as carotid ultrasound which were unremarkable.  She also had a Holter monitor which was unrevealing.  She was seen by Dr. Ellouise [name] neurologist on 12/30/2015 but has not not had any new recurrent neurological symptoms. ROS:   14 system review of systems is positive for weight loss, eye pain, diarrhea, constipation, itching, memory loss, weakness, slurred speech, depression, too much sleep, change in appetite, joint pain and swelling, allergies, runny nose and all other systems negative PMH:  Past Medical History:  Diagnosis Date  . Allergy   . Anemia    iron deficiency  . 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 Cataract   .  Colon polyps   . CVA (cerebral vascular accident) ([place]) 02/2017  . Depression   . Hyperlipidemia   . Hypertension   . Osteoporosis 01/01/2015  . Rheumatoid arteritis   . Thyroid disease   . TIA (transient ischemic attack) 2016  . Urinary incontinence     Social History:  Social History   Socioeconomic History  . Marital status: Divorced    Spouse name: Not on file  . Number of children: 8  . Years of education: Not on file  . Highest  education level: Not on file  Occupational History    Employer: RETIRED    Comment: Retired from [employer]  . Financial resource strain: Not on file  . Food insecurity:    Worry: Not on file    Inability: Not on file  . Transportation needs:    Medical: Not on file    Non-medical: Not on file  Tobacco Use  . Smoking status: Current Some Day Smoker    Packs/day: 0.25    Years: 32.00    Pack years: 8.00    Types: Cigarettes  . Smokeless tobacco: Never Used  . Tobacco comment: 6 or 7 per day  Substance and Sexual Activity  . Alcohol use: No    Alcohol/week: 0.0 oz  . Drug use: No  . Sexual activity: Never  Lifestyle  . Physical activity:    Days per week: Not on file    Minutes per session: Not on file  . Stress: Not on file  Relationships  . Social connections:    Talks on phone: Not on file    Gets together: Not on file    Attends religious service: Not on file    Active member of club or organization: Not on file    Attends meetings of clubs or organizations: Not on file    Relationship status: Not on file  . Intimate partner violence:    Fear of current or ex partner: Not on file    Emotionally abused: Not on file    Physically abused: Not on file    Forced sexual activity: Not on file  Other Topics Concern  . Not on file  Social History Narrative  . Not on file    Medications:   Current Outpatient Medications on File Prior to Visit  Medication Sig Dispense Refill  . Adalimumab (HUMIRA PEN [place]) Inject into the skin.     Marland Kitchen atorvastatin (LIPITOR) 40 MG tablet Take 1 tablet (40 mg total) by mouth daily. 30 tablet 5  . clopidogrel (PLAVIX) 75 MG tablet Take by mouth.    Nancy [name] Calcium (STOOL SOFTENER PO) Take 1 tablet by mouth daily.    Marland Kitchen escitalopram (LEXAPRO) 5 MG tablet Take 1 tablet (5 mg total) by mouth daily. 90 tablet 1  . fluticasone (FLONASE) 50 MCG/ACT nasal spray Place 2 sprays into both nostrils daily. 16 g 5  . folic acid  (FOLVITE) 1 MG tablet Take 1 mg by mouth daily.     Marland Kitchen leflunomide (ARAVA) 10 MG tablet Take 10 mg by mouth daily.  3  . levothyroxine (SYNTHROID, LEVOTHROID) 100 MCG tablet TAKE 1 TABLET BY MOUTH EVERY DAY 30 tablet 1  . loratadine (CLARITIN) 10 MG tablet Take 10 mg by mouth daily as needed. Reported on 10/23/2015    . losartan (COZAAR) 100 MG tablet Take 1 tablet (100 mg total) by mouth daily. 30 tablet 3  . methotrexate (RHEUMATREX) 2.5 MG tablet Take  20 mg by mouth once a week.   3  . metoprolol succinate (TOPROL-XL) 100 MG 24 hr tablet TAKE 1 TABLE BY MOUTH DAILY. TAKE WITH OR IMMEDIATELY FOLLOWING A MEAL. 90 tablet 1  . predniSONE (DELTASONE) 5 MG tablet Take 5 mg by mouth as needed.     No current facility-administered medications on file prior to visit.     Allergies:   Allergies  Allergen Reactions  . Amlodipine Other (See Comments)    dizziness  . Iron Nausea And Vomiting    Can take infusions     Physical Exam General:frail elderly african american lady, seated, in no evident distress Head: head normocephalic and atraumatic.  Neck: supple with no carotid or supraclavicular bruits Cardiovascular: regular rate and rhythm, no murmurs Musculoskeletal: no deformity Skin:  no rash/petichiae Vascular:  Normal pulses all extremities Vitals:   04/11/18 0836  BP: (!) 156/80  Pulse: 72   Neurologic Exam Mental Status: Awake and fully alert. Oriented to place and time. Recent and remote memory intact. Attention span, concentration and fund of knowledge appropriate. Mood and affect appropriate.  Cranial Nerves: Fundoscopic exam reveals sharp disc margins. Pupils equal, briskly reactive to light. Extraocular movements full without nystagmus. Visual fields full to confrontation. Hearing intact. Facial sensation intact. Face, tongue, palate moves normally and symmetrically.  Motor: Normal bulk and tone. Normal strength in all tested extremity muscles. Sensory.: intact to touch  ,pinprick .position and vibratory sensation.  Coordination: Rapid alternating movements normal in all extremities. Finger-to-nose and heel-to-shin performed accurately bilaterally. Gait and Station: Arises from chair without difficulty. Stance is normal. Gait demonstrates normal stride length and balance . Able to heel, toe and tandem walk with great difficulty.  Reflexes: 1+ and symmetric. Toes downgoing.   NIHSS  0 Modified Rankin 0   ASSESSMENT: 77 year old African-American lady with remote history of left hemispheric TIA in February 2016 due to small vessel disease with vascular risk factors of hypertension, hyperlipidemia and age.  History of multiple episodes of syncope likely vasovagal with most recent being in January 2019.    PLAN: I had a long d/w patient about her remote TIAs and syncopal episodes, risk for recurrent stroke/TIAs, personally independently reviewed imaging studies and stroke evaluation results and answered questions.Continue Plavix for secondary stroke prevention and maintain strict control of hypertension with blood pressure goal below 130/90, diabetes with hemoglobin A1c goal below 6.5% and lipids with LDL cholesterol goal below 70 mg/dL. I also advised the patient to eat a healthy diet with plenty of whole grains, cereals, fruits and vegetables, exercise regularly and maintain ideal body weight . Check f/u lipids and HbA1c.maintain adequate hydration.and get up slowly and do orthostatic tolerance exercises.  Followup with primary MD for weight loss and in the future with me only as necessary Greater than 50% of time during this 25 minute visit was spent on counseling,explanation of diagnosis,of syncope and TIA planning of further management, discussion with patient and family and coordination of care Nancy [name]. MD Note: This document was prepared with digital dictation and possible smart phrase technology. Any transcriptional errors that result from this process are  unintentional

## 2018-04-12 ENCOUNTER — Telehealth: Payer: Self-pay

## 2018-04-12 LAB — LIPID PANEL
Chol/HDL Ratio: 4 ratio (ref 0.0–4.4)
Cholesterol, Total: 213 mg/dL — ABNORMAL HIGH (ref 100–199)
HDL: 53 mg/dL (ref >39)
LDL Calculated: 135 mg/dL — ABNORMAL HIGH (ref 0–99)
Triglycerides: 127 mg/dL (ref 0–149)
VLDL Cholesterol Cal: 25 mg/dL (ref 5–40)

## 2018-04-12 LAB — HEMOGLOBIN A1C
Est. average glucose Bld gHb Est-mCnc: 117 mg/dL
Hgb A1c MFr Bld: 5.7 % — ABNORMAL HIGH (ref 4.8–5.6)

## 2018-04-12 NOTE — Telephone Encounter (Signed)
Rn stated per Dr. Leonie Man he wants her PCP to evaluate and start prescribing the 80mg  lipitor. Rn stated a note, and labs were forward to her. PT stated she will call PCP. Pt stated she has not taken medications in a year.

## 2018-04-12 NOTE — Telephone Encounter (Signed)
Notes recorded by Marval Regal, RN on 04/12/2018 at 3:45 PM EDT Rn call patient that her bad cholesterol is high at 135. Increase lipitor to 80mg  to reduce stroke risk. Pt stated she has not taken the medication in a year. She will need new rx. Rn stated the lipitor was last prescribed by her PCP. Pt verbalized understanding. ------

## 2018-04-12 NOTE — Telephone Encounter (Signed)
-----   Message from Garvin Fila, MD sent at 04/12/2018 10:23 AM EDT ----- Mitchell Heir let patient know that her bad cholesterol is high at 135 and to increase lipitor dose to 80 mg daily to reduce her stroke risk

## 2018-04-13 ENCOUNTER — Telehealth: Payer: Self-pay | Admitting: Family

## 2018-04-13 ENCOUNTER — Other Ambulatory Visit: Payer: Self-pay | Admitting: Family

## 2018-04-13 DIAGNOSIS — E785 Hyperlipidemia, unspecified: Secondary | ICD-10-CM

## 2018-04-13 MED ORDER — ATORVASTATIN CALCIUM 40 MG PO TABS: 40.0000 mg | ORAL_TABLET | Freq: Every day | ORAL | 5 refills | Status: DC

## 2018-04-13 NOTE — Telephone Encounter (Signed)
Pt is stating the pharmacy that the prolia was sent to states she cant pick it up. It would have to go to the doc office. She is wanting to confirm what needs to be done. She did have to provide them with her insurance information. Her normal pharmacy had to refer it to another pharmacy but she is not sure what pharmacy that was. when she followed up with that pharmacy she stated it would be sent to the office. CB# [id number].

## 2018-04-13 NOTE — Telephone Encounter (Signed)
Received note from neuro.  Needs to restart atorvastatin.  Refill sent, repeat FLP in 6 weeks, dx hyperlipidemia.

## 2018-04-13 NOTE — Telephone Encounter (Signed)
Notified pt and she states that neuro wanted her to increase dose to 80mg  even though she had been out of medication x 2-3 months. Advised her PCP had reviewed note and wants pt to restart previous dose of 40mg  once a day. Lab appt scheduled for 05/25/18 at 1:30pm. Future order has been entered.

## 2018-04-13 NOTE — Telephone Encounter (Signed)
Called [facility] and was given specialty # of [phone] and spoke with Estill Bamberg. She states medication is still pending verification in their system. Once verification has been completed they will contact pt to discuss cost and shipping information. We will cancel nurse visit scheduled for 04/15/18 and pt has been instructed to call us back to schedule prolia once they have contacted her about shipment. Provided pt with specialty # to call them if she has not been contacted by Friday to check on status of Prolia. Pt voices understanding.

## 2018-04-15 ENCOUNTER — Ambulatory Visit: Payer: Medicare Other

## 2018-04-19 DIAGNOSIS — M170 Bilateral primary osteoarthritis of knee: Secondary | ICD-10-CM | POA: Diagnosis not present

## 2018-04-19 DIAGNOSIS — Z79899 Other long term (current) drug therapy: Secondary | ICD-10-CM | POA: Diagnosis not present

## 2018-04-19 DIAGNOSIS — M0579 Rheumatoid arthritis with rheumatoid factor of multiple sites without organ or systems involvement: Secondary | ICD-10-CM | POA: Diagnosis not present

## 2018-04-19 DIAGNOSIS — M2550 Pain in unspecified joint: Secondary | ICD-10-CM | POA: Diagnosis not present

## 2018-04-22 ENCOUNTER — Ambulatory Visit (HOSPITAL_BASED_OUTPATIENT_CLINIC_OR_DEPARTMENT_OTHER)
Admission: RE | Admit: 2018-04-22 | Discharge: 2018-04-22 | Disposition: A | Discharge status: Home / Self Care | Payer: Medicare Other | Source: Ambulatory Visit | Attending: Neurology | Admitting: Neurology

## 2018-04-22 ENCOUNTER — Ambulatory Visit (HOSPITAL_COMMUNITY)
Admission: RE | Admit: 2018-04-22 | Discharge: 2018-04-22 | Disposition: A | Discharge status: Home / Self Care | Payer: Medicare Other | Source: Ambulatory Visit | Attending: Neurology | Admitting: Neurology

## 2018-04-22 DIAGNOSIS — R55 Syncope and collapse: Secondary | ICD-10-CM | POA: Insufficient documentation

## 2018-04-22 NOTE — Progress Notes (Signed)
Preliminary notes--Bilateral TCD exam completed.    Nancy [name] Mellow (RDMS RVT) 04/22/18 3:14 PM

## 2018-04-22 NOTE — Progress Notes (Signed)
Preliminary notes--Bilateral carotid duplex exam completed.  Bilateral vertebral arteries antegrade flow. Bilateral ICA wnl.    Hongying [name] (RDMS RVT) 04/22/18 3:06 PM

## 2018-04-28 ENCOUNTER — Telehealth: Payer: Self-pay

## 2018-04-28 NOTE — Telephone Encounter (Signed)
Notes recorded by Marval Regal, RN on 04/28/2018 at 11:59 AM EDT RN call patient that carotid ultrasound shows no significant narrowing at either carotid bifurcation in the neck. Pt verbalized understanding. ------

## 2018-04-28 NOTE — Telephone Encounter (Signed)
-----   Message from Garvin Fila, MD sent at 04/27/2018  2:46 PM EDT ----- Nancy [name] inform the patient that carotid ultrasound shows no significant narrowing at either carotid bifurcation in the neck

## 2018-04-28 NOTE — Telephone Encounter (Signed)
Notes recorded by Marval Regal, RN on 04/28/2018 at 12:00 PM EDT Rn call patient about TCD test.The TCD study was not optimal as all blood vessels were not found but showed no major blockages to worry about. Pt verbalized understanding. ------

## 2018-04-30 ENCOUNTER — Other Ambulatory Visit: Payer: Self-pay | Admitting: Family

## 2018-05-19 ENCOUNTER — Telehealth: Payer: Self-pay | Admitting: *Deleted

## 2018-05-19 ENCOUNTER — Other Ambulatory Visit: Payer: Self-pay | Admitting: Family

## 2018-05-19 DIAGNOSIS — H04123 Dry eye syndrome of bilateral lacrimal glands: Secondary | ICD-10-CM | POA: Diagnosis not present

## 2018-05-19 DIAGNOSIS — H20022 Recurrent acute iridocyclitis, left eye: Secondary | ICD-10-CM | POA: Diagnosis not present

## 2018-05-19 DIAGNOSIS — H353131 Nonexudative age-related macular degeneration, bilateral, early dry stage: Secondary | ICD-10-CM | POA: Diagnosis not present

## 2018-05-19 DIAGNOSIS — H31002 Unspecified chorioretinal scars, left eye: Secondary | ICD-10-CM | POA: Diagnosis not present

## 2018-05-19 NOTE — Telephone Encounter (Signed)
Awaiting prolia shipment to the office. Will contact pt once it has been received.

## 2018-05-19 NOTE — Telephone Encounter (Signed)
Copied from [place] [id number]. Topic: General - Other >> May 19, 2018  8:35 AM Keene Breath wrote: Reason for CRM: Sarah with [facility] called to notify the office that she is sending Prolia out for patient.  If there are any questions, please call them at [phone].

## 2018-05-20 NOTE — Telephone Encounter (Signed)
Prolia received, placed in fridge. Author phoned pt. to notify and make a nurse appointment. Author attempted to make appointment for 7/17 when pt. Is coming into lab, but unable to fit her in. Author made 7/18 appointment with the nurse for prolia, but told pt. to check in with the front end on 7/17 to see if we can add her on that day to do prolia at that time instead. Pt. verbalized understanding, as well as need to fast for lipid profile.

## 2018-05-25 ENCOUNTER — Other Ambulatory Visit (INDEPENDENT_AMBULATORY_CARE_PROVIDER_SITE_OTHER): Payer: Medicare Other

## 2018-05-25 ENCOUNTER — Other Ambulatory Visit: Payer: Self-pay | Admitting: Family

## 2018-05-25 ENCOUNTER — Other Ambulatory Visit: Payer: Medicare Other

## 2018-05-25 ENCOUNTER — Ambulatory Visit (INDEPENDENT_AMBULATORY_CARE_PROVIDER_SITE_OTHER): Payer: Medicare Other

## 2018-05-25 DIAGNOSIS — E785 Hyperlipidemia, unspecified: Secondary | ICD-10-CM

## 2018-05-25 DIAGNOSIS — M810 Age-related osteoporosis without current pathological fracture: Secondary | ICD-10-CM

## 2018-05-25 LAB — LIPID PANEL
Cholesterol: 192 mg/dL (ref 0–200)
HDL: 58.2 mg/dL (ref >39.00)
LDL Cholesterol: 113 mg/dL — ABNORMAL HIGH (ref 0–99)
NonHDL: 134.04
Total CHOL/HDL Ratio: 3
Triglycerides: 106 mg/dL (ref 0.0–149.0)
VLDL: 21.2 mg/dL (ref 0.0–40.0)

## 2018-05-25 MED ORDER — DENOSUMAB 60 MG/ML ~~LOC~~ SOSY
60.0000 mg | PREFILLED_SYRINGE | Freq: Once | SUBCUTANEOUS | Status: AC
  Administered 2018-05-25: 60 mg via SUBCUTANEOUS

## 2018-05-25 NOTE — Telephone Encounter (Signed)
LDL above goal. Please increase atorvastatin to 80mg  once daily. Repeat lipids in 3 months.

## 2018-05-25 NOTE — Progress Notes (Signed)
Pre visit review using our clinic tool,if applicable. No additional management support is needed unless otherwise documented below in the visit note.   Patient in for Prolia injection per order from Debbrah Alar NP.  No complaints from last Prolia injection voiced. Patient satates she has had problems with her insurance coverage but has everything worked out now.  Given 60 mg IM right arm,patient tolerated well.  Given appointment reminder cart for 6 months.

## 2018-05-25 NOTE — Progress Notes (Signed)
Noted.   Liyat Faulkenberry S O'Sullivan NP 

## 2018-05-26 ENCOUNTER — Ambulatory Visit: Payer: Medicare Other

## 2018-06-05 ENCOUNTER — Emergency Department (HOSPITAL_COMMUNITY)
Admission: EM | Admit: 2018-06-05 | Discharge: 2018-06-05 | Disposition: A | Discharge status: Home / Self Care | Payer: Medicare Other | Attending: Emergency Medicine | Admitting: Emergency Medicine

## 2018-06-05 DIAGNOSIS — R0902 Hypoxemia: Secondary | ICD-10-CM | POA: Diagnosis not present

## 2018-06-05 DIAGNOSIS — R55 Syncope and collapse: Secondary | ICD-10-CM | POA: Insufficient documentation

## 2018-06-05 DIAGNOSIS — R61 Generalized hyperhidrosis: Secondary | ICD-10-CM | POA: Diagnosis not present

## 2018-06-05 DIAGNOSIS — Z79899 Other long term (current) drug therapy: Secondary | ICD-10-CM | POA: Diagnosis not present

## 2018-06-05 DIAGNOSIS — E039 Hypothyroidism, unspecified: Secondary | ICD-10-CM | POA: Diagnosis not present

## 2018-06-05 DIAGNOSIS — I10 Essential (primary) hypertension: Secondary | ICD-10-CM | POA: Insufficient documentation

## 2018-06-05 DIAGNOSIS — F17210 Nicotine dependence, cigarettes, uncomplicated: Secondary | ICD-10-CM | POA: Diagnosis not present

## 2018-06-05 DIAGNOSIS — R4020 Unspecified coma: Secondary | ICD-10-CM | POA: Diagnosis not present

## 2018-06-05 DIAGNOSIS — R531 Weakness: Secondary | ICD-10-CM | POA: Diagnosis not present

## 2018-06-05 LAB — BASIC METABOLIC PANEL
Anion gap: 8 (ref 5–15)
BUN: 13 mg/dL (ref 8–23)
CO2: 26 mmol/L (ref 22–32)
Calcium: 8.9 mg/dL (ref 8.9–10.3)
Chloride: 108 mmol/L (ref 98–111)
Creatinine, Ser: 0.92 mg/dL (ref 0.44–1.00)
GFR calc Af Amer: >60 mL/min (ref >60)
GFR calc non Af Amer: 59 mL/min — ABNORMAL LOW (ref >60)
Glucose, Bld: 104 mg/dL — ABNORMAL HIGH (ref 70–99)
Potassium: 3.8 mmol/L (ref 3.5–5.1)
Sodium: 142 mmol/L (ref 135–145)

## 2018-06-05 LAB — URINALYSIS, ROUTINE W REFLEX MICROSCOPIC
Bilirubin Urine: NEGATIVE
Glucose, UA: NEGATIVE mg/dL
Hgb urine dipstick: NEGATIVE
Ketones, ur: NEGATIVE mg/dL
Leukocytes, UA: NEGATIVE
Nitrite: NEGATIVE
Protein, ur: NEGATIVE mg/dL
Specific Gravity, Urine: 1.019 (ref 1.005–1.030)
pH: 6 (ref 5.0–8.0)

## 2018-06-05 LAB — CBC
HCT: 39.2 % (ref 36.0–46.0)
Hemoglobin: 11.9 g/dL — ABNORMAL LOW (ref 12.0–15.0)
MCH: 28.5 pg (ref 26.0–34.0)
MCHC: 30.4 g/dL (ref 30.0–36.0)
MCV: 93.8 fL (ref 78.0–100.0)
Platelets: 221 10*3/uL (ref 150–400)
RBC: 4.18 MIL/uL (ref 3.87–5.11)
RDW: 14.2 % (ref 11.5–15.5)
WBC: 8.1 10*3/uL (ref 4.0–10.5)

## 2018-06-05 LAB — CBG MONITORING, ED: Glucose-Capillary: 76 mg/dL (ref 70–99)

## 2018-06-05 NOTE — ED Triage Notes (Signed)
Pt brought in by [facility] from home for syncopal episode- per EMS pt was sitting outside during episode, possibly due to the heat. Pt did not fall/hit head. Pt A+Ox4 on EMS arrival. Pt has no complaints. Pt A+Ox4 and in NAD on arrival.

## 2018-06-05 NOTE — ED Provider Notes (Addendum)
[place] EMERGENCY DEPARTMENT Provider Note   CSN: [id number] Arrival date & time: 06/05/18  1727     History   Chief Complaint Chief Complaint  Patient presents with  . Loss of Consciousness    HPI Nancy [name] is a 77 y.o. female.  Pt is a 77yo with hx of hypertension, hyperlipidemia and rheumatoid arthritis who presents with a syncopal event.  She states she was sitting outside on her porch talking to a neighbor.  She was initiated and did not feel overly heated.  She states that she started getting a little bit of nausea which she says was very mild and per report of her neighbor she slumped over in the chair.  She did not fall.  She was unresponsive for about 1 to 2 minutes.  She was incontinent of urine.  She denies any injuries or pain from the event.  There is no visualized seizure activity.  She denied any palpitations or chest pain prior to the event.  She is back to baseline currently.  She denies any numbness or weakness to her extremities.  No vision changes or speech deficits.  No recent illnesses.  She was admitted in 2016 for a possible TIA after she had transient speech deficits with some confusion.  Her work-up was negative and she was started on Plavix.  In January of this year she had a near syncopal type event.  She has had prior EEGs, carotid Dopplers and Holter monitors evaluating these events that have not been the etiology for her episodes.  She is followed by a neurologist.     Past Medical History:  Diagnosis Date  . Allergy   . Anemia    iron deficiency  . 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 Cataract   . Colon polyps   . CVA (cerebral vascular accident) ([place]) 02/2017  . Depression   . Hyperlipidemia   . Hypertension   . Osteoporosis 01/01/2015  . Rheumatoid arteritis   . Thyroid disease   . TIA (transient ischemic attack) 2016  . Urinary incontinence     Patient Active Problem List   Diagnosis Date Noted  . Tendinitis of right  rotator cuff 02/24/2018  . History of CVA (cerebrovascular accident) 03/16/2017  . Hematuria 03/16/2017  . Faintness 12/09/2015  . Loss of weight 10/15/2015  . Anemia, iron deficiency 09/30/2015  . Hyperglycemia 06/07/2015  . Near syncope 05/15/2015  . Osteoporosis 01/01/2015  . Expressive aphasia 12/16/2014  . TIA (transient ischemic attack) 12/16/2014  . Pain in joint, ankle and foot 09/06/2014  . Constipation 09/06/2014  . Rheumatoid arthritis ([place]) 07/25/2014  . Sciatica 01/31/2014  . Hypothyroidism 01/12/2012  . Back pain 01/11/2012  . Edema 06/15/2011  . ANEMIA, B12 DEFICIENCY 11/19/2010  . ANXIETY 10/14/2010  . 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 Hyperlipidemia 12/17/2009  . Iron deficiency anemia 12/17/2009  . TOBACCO ABUSE 12/17/2009  . Depression 12/17/2009  . Essential hypertension 12/17/2009  . ALLERGIC RHINITIS 12/17/2009  . ARTHRITIS, RHEUMATOID 12/17/2009  . OSTEOPOROSIS 12/17/2009  . URINARY INCONTINENCE 12/17/2009    Past Surgical History:  Procedure Laterality Date  . ABDOMINAL HYSTERECTOMY  1976  . CATARACT EXTRACTION Bilateral   . TUMOR REMOVAL  last was 1976   left ankle x3     OB History   None      Home Medications    Prior to Admission medications   Medication Sig Start Date End Date Taking? Authorizing Provider  Adalimumab (HUMIRA  PEN [place]) Inject into the skin.    Yes [provider]  clopidogrel (PLAVIX) 75 MG tablet Take 75 mg by mouth daily.  03/07/17  Yes [provider]  Docusate Calcium (STOOL SOFTENER PO) Take 1 tablet by mouth daily as needed (constipation).    Yes [provider]  escitalopram (LEXAPRO) 5 MG tablet Take 1 tablet (5 mg total) by mouth daily. 07/19/17  Yes Debbrah Alar, NP  fluticasone (FLONASE) 50 MCG/ACT nasal spray Place 2 sprays into both nostrils daily. Patient taking differently: Place 2 sprays into both nostrils daily as needed for allergies.  08/31/16 06/05/18 Yes Debbrah Alar,  NP  leflunomide (ARAVA) 10 MG tablet Take 10 mg by mouth daily. 08/15/15  Yes [provider]  levothyroxine (SYNTHROID, LEVOTHROID) 100 MCG tablet TAKE 1 TABLET BY MOUTH EVERY DAY 03/29/18  Yes Debbrah Alar, NP  loratadine (CLARITIN) 10 MG tablet Take 10 mg by mouth daily as needed. Reported on 10/23/2015   Yes [provider]  losartan (COZAAR) 100 MG tablet TAKE 1 TABLET BY MOUTH EVERY DAY 05/19/18  Yes Debbrah Alar, NP  metoprolol succinate (TOPROL-XL) 100 MG 24 hr tablet TAKE 1 TABLE BY MOUTH DAILY. TAKE WITH OR IMMEDIATELY FOLLOWING A MEAL. 01/03/18  Yes Debbrah Alar, NP  predniSONE (DELTASONE) 5 MG tablet Take 5 mg by mouth as needed. 02/08/17  Yes [provider]  PROLIA 60 MG/ML SOSY injection Inject 60 mg as directed every 6 (six) months. 05/11/18  Yes [provider]  RESTASIS 0.05 % ophthalmic emulsion Place 1 drop into both eyes 2 (two) times daily. 05/19/18  Yes [provider]  atorvastatin (LIPITOR) 40 MG tablet Take 1 tablet (40 mg total) by mouth daily. 04/13/18 05/25/18  Debbrah Alar, NP    Family History Family History  Problem Relation Age of Onset  . Hypertension Father   . Colon cancer Father   . Ulcerative colitis Mother   . Parkinson's disease Mother   . Heart disease Sister 64       CABG x 3  . Cirrhosis Brother   . Alcohol abuse Brother   . Colitis Daughter   . Colon polyps Daughter   . Heart attack Daughter   . Hypertension Son   . Kidney disease Son        transplant due to kidney problems after [facility]  . Arthritis Other   . Coronary artery disease Other   . Hyperlipidemia Other   . Hypertension Other   . Stroke Sister        died 67  . Arthritis Sister   . Hypertension Sister   . Thyroid disease Daughter        x 3  . Cirrhosis Brother 40       ETOH abuse    Social History Social History   Tobacco Use  . Smoking status: Current Some Day Smoker    Packs/day: 0.25    Years:  32.00    Pack years: 8.00    Types: Cigarettes  . Smokeless tobacco: Never Used  . Tobacco comment: 6 or 7 per day  Substance Use Topics  . Alcohol use: No    Alcohol/week: 0.0 oz  . Drug use: No     Allergies   Amlodipine and Iron   Review of Systems Review of Systems  Constitutional: Negative for chills, diaphoresis, fatigue and fever.  HENT: Negative for congestion, rhinorrhea and sneezing.   Eyes: Negative.   Respiratory: Negative for cough, chest  tightness and shortness of breath.   Cardiovascular: Negative for chest pain and leg swelling.  Gastrointestinal: Positive for nausea. Negative for abdominal pain, blood in stool, diarrhea and vomiting.  Genitourinary: Negative for difficulty urinating, flank pain, frequency and hematuria.  Musculoskeletal: Negative for arthralgias and back pain.  Skin: Negative for rash.  Neurological: Positive for syncope. Negative for dizziness, speech difficulty, weakness, numbness and headaches.     Physical Exam Updated Vital Signs BP (!) 158/60   Pulse 72   Temp 98.2 F (36.8 C) (Oral)   Resp 19   Ht 5\' 5"  (1.651 m)   Wt 50.8 kg (112 lb)   SpO2 99%   BMI 18.64 kg/m   Physical Exam  Constitutional: She is oriented to person, place, and time. She appears well-developed and well-nourished.  HENT:  Head: Normocephalic and atraumatic.  Eyes: Pupils are equal, round, and reactive to light.  Neck: Normal range of motion. Neck supple.  Cardiovascular: Normal rate, regular rhythm and normal heart sounds.  Pulmonary/Chest: Effort normal and breath sounds normal. No respiratory distress. She has no wheezes. She has no rales. She exhibits no tenderness.  Abdominal: Soft. Bowel sounds are normal. There is no tenderness. There is no rebound and no guarding.  Musculoskeletal: Normal range of motion. She exhibits no edema.  Lymphadenopathy:    She has no cervical adenopathy.  Neurological: She is alert and oriented to person, place, and  time.  Motor 5/5 all extremities Sensation grossly intact to LT all extremities Finger to Nose intact, no pronator drift CN II-XII grossly intact    Skin: Skin is warm and dry. No rash noted.  Psychiatric: She has a normal mood and affect.     ED Treatments / Results  Labs (all labs ordered are listed, but only abnormal results are displayed) Labs Reviewed  BASIC METABOLIC PANEL - Abnormal; Notable for the following components:      Result Value   Glucose, Bld 104 (*)    GFR calc non Af Amer 59 (*)    All other components within normal limits  CBC - Abnormal; Notable for the following components:   Hemoglobin 11.9 (*)    All other components within normal limits  URINALYSIS, ROUTINE W REFLEX MICROSCOPIC - Abnormal; Notable for the following components:   APPearance HAZY (*)    All other components within normal limits  CBG MONITORING, ED    EKG EKG Interpretation  Date/Time:  Sunday June 05 2018 17:37:45 EDT Ventricular Rate:  70 PR Interval:    QRS Duration: 93 QT Interval:  410 QTC Calculation: 443 R Axis:   52 Text Interpretation:  Sinus rhythm Probable left ventricular hypertrophy similar to prior EKG Confirmed by Malvin Johns [phone]) on 06/05/2018 6:16:15 PM   Radiology No results found.  Procedures Procedures (including critical care time)  Medications Ordered in ED Medications - No data to display   Initial Impression / Assessment and Plan / ED Course  I have reviewed the triage vital signs and the nursing notes.  Pertinent labs & imaging results that were available during my care of the patient were reviewed by me and considered in my medical decision making (see chart for details).     Patient is a 77 year old female who presents after syncopal episode.  She had no witnessed seizure activity.  She was at baseline shortly after the incident.  She had no injuries from the incident.  She has had similar episodes in the past and has had fairly  extensive evaluation in the past including carotid Dopplers, Holter monitoring and MRIs.  I feel this point she can be safely discharged.  She has an appointment in 2 days to follow-up with her PCP.  Return precautions were given.  Final Clinical Impressions(s) / ED Diagnoses   Final diagnoses:  Syncope, unspecified syncope type    ED Discharge Orders    None       Malvin Johns, MD 06/05/18 2138    Malvin Johns, MD 06/05/18 2159

## 2018-06-06 ENCOUNTER — Telehealth: Payer: Self-pay

## 2018-06-06 NOTE — Telephone Encounter (Signed)
Noted  

## 2018-06-06 NOTE — Telephone Encounter (Signed)
Patient to schedule ED follow up visit. Patient has appointment this week scheduled for Physical.

## 2018-06-08 ENCOUNTER — Ambulatory Visit (INDEPENDENT_AMBULATORY_CARE_PROVIDER_SITE_OTHER): Payer: Medicare Other | Admitting: Family

## 2018-06-08 ENCOUNTER — Encounter: Payer: Self-pay | Admitting: Family

## 2018-06-08 VITALS — BP 170/59 | HR 61 | Temp 98.7°F | Resp 16 | Ht 66.0 in | Wt 115.4 lb

## 2018-06-08 DIAGNOSIS — I10 Essential (primary) hypertension: Secondary | ICD-10-CM | POA: Diagnosis not present

## 2018-06-08 DIAGNOSIS — R55 Syncope and collapse: Secondary | ICD-10-CM

## 2018-06-08 DIAGNOSIS — E785 Hyperlipidemia, unspecified: Secondary | ICD-10-CM | POA: Diagnosis not present

## 2018-06-08 DIAGNOSIS — D649 Anemia, unspecified: Secondary | ICD-10-CM | POA: Diagnosis not present

## 2018-06-08 LAB — BASIC METABOLIC PANEL
BUN: 12 mg/dL (ref 6–23)
CO2: 30 mEq/L (ref 19–32)
Calcium: 9.1 mg/dL (ref 8.4–10.5)
Chloride: 106 mEq/L (ref 96–112)
Creatinine, Ser: 0.65 mg/dL (ref 0.40–1.20)
GFR: 113.6 mL/min (ref >60.00)
Glucose, Bld: 85 mg/dL (ref 70–99)
Potassium: 4.1 mEq/L (ref 3.5–5.1)
Sodium: 142 mEq/L (ref 135–145)

## 2018-06-08 LAB — LIPID PANEL
Cholesterol: 215 mg/dL — ABNORMAL HIGH (ref 0–200)
HDL: 56.3 mg/dL (ref >39.00)
LDL Cholesterol: 138 mg/dL — ABNORMAL HIGH (ref 0–99)
NonHDL: 159.17
Total CHOL/HDL Ratio: 4
Triglycerides: 108 mg/dL (ref 0.0–149.0)
VLDL: 21.6 mg/dL (ref 0.0–40.0)

## 2018-06-08 LAB — HEMOGLOBIN A1C: Hgb A1c MFr Bld: 5.9 % (ref 4.6–6.5)

## 2018-06-08 LAB — IRON: Iron: 59 ug/dL (ref 42–145)

## 2018-06-08 MED ORDER — CARVEDILOL 12.5 MG PO TABS: 12.5000 mg | ORAL_TABLET | Freq: Two times a day (BID) | ORAL | 3 refills | Status: DC

## 2018-06-08 NOTE — Patient Instructions (Addendum)
Please complete lab work prior to leaving. Stop metoprolol, start coreg for blood pressure. You should be contacted about scheduling your appointment with cardiology and with neurology.  Stay well hydrated. Return to ER if you develop recurrent fainting/weakness.

## 2018-06-08 NOTE — Progress Notes (Signed)
Subjective:    [name], female    DOB: 30-Jul-1941, 77 y.o.   MRN: [id number]  HPI  Pt presents today for ER follow up of syncope.  ER record is reviewed.  She presented on 06/05/2018 to [facility] following a syncopal event.  The patient described having a strange feeling while sitting on her neighbor's front porch.  She reports that the neighbor then noted her to slump over in her there was no witnessed seizure activity.   ER work-up included an EKG which noted normal sinus rhythm with a ventricular rate of 70.  She was noted to be mildly anemic with a hemoglobin of 11.9.  There was no imaging performed during her ER visit.  She has previous history of vasovagal syncope which has been evaluated in the past by neurology.  She is followed by Dr. Leonie Man and last saw him on June 3 of this year.  She had another ER visit on November 27, 2017 for a near syncopal event.  She had a negative CT of the head that time.  Back in April 2018 she had an admission for an acute right MCA stroke.  April 2016 she had a visit for a vasovagal episode to the ER.  In February 2016 she had an admission for TIA.  Her past work-up has included transcranial Doppler which did not show any major blockages, bilateral carotid without significant stenosis.  Back in January 2017 she had a Negative 48-hour Holter monitor.  Previous 2D echo performed in 2016 noted grade 1 diastolic dysfunction normal LVEF.  EEG was performed February 2017 which was normal.  The patient reports that since returning home she has been feeling well.  She has had no further syncopal episodes.     Review of Systems     Past Medical History:  Diagnosis Date  . Allergy   . Anemia    iron deficiency  . 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 Cataract   . Colon polyps   . CVA (cerebral vascular accident) ([place]) 02/2017  . Depression   . Hyperlipidemia   . Hypertension   . Osteoporosis 01/01/2015  . Rheumatoid arteritis   .  Thyroid disease   . TIA (transient ischemic attack) 2016  . Urinary incontinence      Social History   Socioeconomic History  . Marital status: Divorced    Spouse name: Not on file  . Number of children: 8  . Years of education: Not on file  . Highest education level: Not on file  Occupational History    Employer: RETIRED    Comment: Retired from [employer]  . Financial resource strain: Not on file  . Food insecurity:    Worry: Not on file    Inability: Not on file  . Transportation needs:    Medical: Not on file    Non-medical: Not on file  Tobacco Use  . Smoking status: Current Some Day Smoker    Packs/day: 0.25    Years: 32.00    Pack years: 8.00    Types: Cigarettes  . Smokeless tobacco: Never Used  . Tobacco comment: 6 or 7 per day  Substance and Sexual Activity  . Alcohol use: No    Alcohol/week: 0.0 oz  . Drug use: No  . Sexual activity: Never  Lifestyle  . Physical activity:    Days per week: Not on file    Minutes per session: Not on  file  . Stress: Not on file  Relationships  . Social connections:    Talks on phone: Not on file    Gets together: Not on file    Attends religious service: Not on file    Active member of club or organization: Not on file    Attends meetings of clubs or organizations: Not on file    Relationship status: Not on file  . Intimate partner violence:    Fear of current or ex partner: Not on file    Emotionally abused: Not on file    Physically abused: Not on file    Forced sexual activity: Not on file  Other Topics Concern  . Not on file  Social History Narrative  . Not on file    Past Surgical History:  Procedure Laterality Date  . ABDOMINAL HYSTERECTOMY  1976  . CATARACT EXTRACTION Bilateral   . 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 Hypertension Father   . Colon cancer Father   . Ulcerative colitis Mother   . Parkinson's disease  Mother   . Heart disease Sister 86       CABG x 3  . Cirrhosis Brother   . Alcohol abuse Brother   . Colitis Daughter   . Colon polyps Daughter   . Heart attack Daughter   . Hypertension Son   . Kidney disease Son        transplant due to kidney problems after [facility]  . Arthritis Other   . Coronary artery disease Other   . Hyperlipidemia Other   . Hypertension Other   . Stroke Sister        died 15  . Arthritis Sister   . Hypertension Sister   . Thyroid disease Daughter        x 3  . Cirrhosis Brother 65       ETOH abuse    Allergies  Allergen Reactions  . Amlodipine Other (See Comments)    dizziness  . Iron Nausea And Vomiting    Can take infusions     Current Outpatient Medications on File Prior to Visit  Medication Sig Dispense Refill  . Adalimumab (HUMIRA PEN [place]) Inject into the skin.     Marland Kitchen clopidogrel (PLAVIX) 75 MG tablet Take 75 mg by mouth daily.     Mariane Baumgarten Calcium (STOOL SOFTENER PO) Take 1 tablet by mouth daily as needed (constipation).     Marland Kitchen escitalopram (LEXAPRO) 5 MG tablet Take 1 tablet (5 mg total) by mouth daily. 90 tablet 1  . leflunomide (ARAVA) 10 MG tablet Take 10 mg by mouth daily.  3  . levothyroxine (SYNTHROID, LEVOTHROID) 100 MCG tablet TAKE 1 TABLET BY MOUTH EVERY DAY 30 tablet 1  . loratadine (CLARITIN) 10 MG tablet Take 10 mg by mouth daily as needed. Reported on 10/23/2015    . losartan (COZAAR) 100 MG tablet TAKE 1 TABLET BY MOUTH EVERY DAY 90 tablet 1  . metoprolol succinate (TOPROL-XL) 100 MG 24 hr tablet TAKE 1 TABLE BY MOUTH DAILY. TAKE WITH OR IMMEDIATELY FOLLOWING A MEAL. 90 tablet 1  . predniSONE (DELTASONE) 5 MG tablet Take 5 mg by mouth as needed.    Marland Kitchen PROLIA 60 MG/ML SOSY injection Inject 60 mg as directed every 6 (six) months.    . RESTASIS 0.05 % ophthalmic emulsion Place 1 drop into both eyes 2 (two) times  daily.  4  . fluticasone (FLONASE) 50 MCG/ACT nasal spray Place 2 sprays into both nostrils daily. (Patient  taking differently: Place 2 sprays into both nostrils daily as needed for allergies. ) 16 g 5  . [DISCONTINUED] atorvastatin (LIPITOR) 40 MG tablet Take 1 tablet (40 mg total) by mouth daily. 30 tablet 5   No current facility-administered medications on file prior to visit.     BP (!) 170/59 (BP Location: Right Arm, Patient Position: Sitting, Cuff Size: Small)   Pulse 61   Temp 98.7 F (37.1 C) (Oral)   Resp 16   Ht 5\' 6"  (1.676 m)   Wt 115 lb 6.4 oz (52.3 kg)   SpO2 100%   BMI 18.63 kg/m    Objective:   Physical Exam  Constitutional: She is oriented to person, place, and time. She appears well-developed and well-nourished.  Cardiovascular: Normal rate, regular rhythm and normal heart sounds.  No murmur heard. Pulmonary/Chest: Effort normal and breath sounds normal. No respiratory distress. She has no wheezes.  Musculoskeletal: She exhibits no edema.  Neurological: She is alert and oriented to person, place, and time.  Skin: Skin is warm.  Psychiatric: She has a normal mood and affect. Her behavior is normal. Judgment and thought content normal.          Assessment & Plan:   Recurrent syncope- etiology remains unclear.  It is certainly possible that she could have recurrent vasovagal syncope, however I think it would be best to refer her to cardiology for further cardiac evaluation and also to get her back in with her neurologist.  I wonder if she may be having seizures that were not picked up on the EEG.  I have advised her to remain well-hydrated.  In order to further evaluate for possible hypoglycemia (she was noted to have mild hypoglycemia in the ED) we will order hemoglobin A1c, C-peptide and serum insulin levels.  Hypertension- blood pressure remains elevated.  Will discontinue her metoprolol and instead place her on Coreg for better blood pressure coverage. BP Readings from Last 3 Encounters:  06/08/18 (!) 170/59  06/05/18 (!) 158/60  04/11/18 (!) 156/80    Hyperlipidemia-patient's LDL remains above goal given stroke history.  We will plan to increase statin to see phone note. Lab Results  Component Value Date   CHOL 215 (H) 06/08/2018   HDL 56.30 06/08/2018   LDLCALC 138 (H) 06/08/2018   TRIG 108.0 06/08/2018   CHOLHDL 4 06/08/2018

## 2018-06-09 ENCOUNTER — Other Ambulatory Visit (INDEPENDENT_AMBULATORY_CARE_PROVIDER_SITE_OTHER): Payer: Medicare Other

## 2018-06-09 ENCOUNTER — Telehealth: Payer: Self-pay | Admitting: Family

## 2018-06-09 DIAGNOSIS — E785 Hyperlipidemia, unspecified: Secondary | ICD-10-CM

## 2018-06-09 DIAGNOSIS — R55 Syncope and collapse: Secondary | ICD-10-CM | POA: Diagnosis not present

## 2018-06-09 NOTE — Telephone Encounter (Signed)
Please contact patient and let her know that her cholesterol is above goal.  It does not look like she is taking Lipitor currently.  Is she still taking it?  If she is taking regularly we will need to increase her dose from 40 mg to 80 mg once daily and plan to repeat her lipid panel in 1 month.  If she is not taking Lipitor 40 mg once daily please resume and add back to her list.

## 2018-06-10 LAB — INSULIN AND C-PEPTIDE, SERUM
C-Peptide: 3.7 ng/mL (ref 1.1–4.4)
INSULIN: 7.4 u[IU]/mL (ref 2.6–24.9)

## 2018-06-10 MED ORDER — ATORVASTATIN CALCIUM 80 MG PO TABS: 80.0000 mg | ORAL_TABLET | Freq: Every day | ORAL | 1 refills | Status: DC

## 2018-06-10 NOTE — Telephone Encounter (Signed)
Notified pt and she voices understanding. Lab appt scheduled for 07/15/18 at 9:30am. Pt has been taking 40mg  once daily and will increase to 2 tablets daily until she completes current supply on hand. New rx sent for 80mg  dose. Lab order entered.

## 2018-06-22 ENCOUNTER — Ambulatory Visit (INDEPENDENT_AMBULATORY_CARE_PROVIDER_SITE_OTHER): Payer: Medicare Other | Admitting: Family Medicine

## 2018-06-22 VITALS — BP 159/69 | HR 64

## 2018-06-22 DIAGNOSIS — I10 Essential (primary) hypertension: Secondary | ICD-10-CM

## 2018-06-22 NOTE — Progress Notes (Signed)
Pre visit review using our clinic tool,if applicable. No additional management support is needed unless otherwise documented below in the visit note.   Pt here for Blood pressure check per order from Debbrah Alar, NP dated 06/08/18.  Patient complains of lightheadedness yesterday and today.   Pt reports compliance with medication. States she has taken today as ordered  BP today @ = 159/69 P = 64  Pt advised per  Lamar Blinks, M.D. patient to continue medications as ordered and return for OV with provider in 10-14 days. Appointment scheduled.

## 2018-06-23 NOTE — Progress Notes (Signed)
Cardiology Office Note:    Date:  06/24/2018   ID:  Nancy [name], DOB [birth year], MRN [id number]  PCP:  Debbrah Alar, NP  Cardiologist:  Shirlee More, MD   Referring MD: Debbrah Alar, NP  ASSESSMENT:    1. Syncope, unspecified syncope type   2. Essential hypertension   3. Rheumatoid arthritis with positive rheumatoid factor, involving unspecified site ([place])   4. TIA (transient ischemic attack)   5. BMI less than 19,adult    PLAN:    In order of problems listed above:  1. She has had a total of 3 episodes of what is profound syncope 2 in the last 8 months further evaluation use an extended ambulatory monitor 14 days of unremarkable implanted loop recorder.  Patient and her daughter are in agreement. 2. Stable continue current medication including beta-blocker and ARB 3. Stable managed by rheumatology 4. Stable managed by neurology continue her clopidogrel and statin.  I do not think the last episode was either TIA or seizure. 5. Noted, may benefit from nutritional support  Next appointment in 3 months, I will refer her for an implantable loop recorder if her monitor is unremarkable and see her back in the office sooner if we document arrhythmia by telemetry with implanted loop recorder   Medication Adjustments/Labs and Tests Ordered: Current medicines are reviewed at length with the patient today.  Concerns regarding medicines are outlined above.  Orders Placed This Encounter  Procedures  . LONG TERM MONITOR (3-14 DAYS)   No orders of the defined types were placed in this encounter.    No chief complaint on file.   History of Present Illness:    Nancy [name] is a 77 y.o. female with a history of hypertension, stroke and previous sncope who is being seen today for the evaluation of syncope at the request of Debbrah Alar, NP. Previous tests in January 2017 she had a Negative 48-hour Holter monitor.  Previous 2D echo performed in 2016 noted grade 1  diastolic dysfunction normal  She was seen  at [facility] ED 06/05/2018 after a syncopal episode associated with urinary incontinence.  There was no witnessed seizure activity.  She had an admission to the hospital 2016 for a TIA and she has had previous syncope evaluation.which was unremarkable.  At her request I called on speaker phone with the patient and reviewed findings with her daughter Seth Bake in [place].  She is in agreement with the plan.  She has had a remote episode of syncope and 2 episodes since January.  Last episode was profound she walked to a neighbor's home sat on the porch and had a feeling of weakness lost consciousness and from her description was unresponsive for up to 10 minutes to EMS arrived.  There was no ictal activity but she had urinary incontinence and she was clear in her thought process when she recovered.  She said she has episodes of dizziness but in total she has had 3 episodes of syncope.  Previous evaluation with a Holter monitor echocardiogram was unremarkable.  She has no shortness of breath chest pain and no recurrent TIA and is under the care of neurology.  Her daughter works in the [employer] system is concerned that her mother has cardiac arrhythmia especially bradycardia may need a pacemaker and encouraged her to be seen today.  I reviewed with the patient and her daughter the difficulty in documenting arrhythmia responsible for syncope and advised her to wear a 14-day extended monitor  ZIO and if unrevealing to have an implanted loop recorder.  They are in agreement.  She has a drop in blood pressure but her standing systolic is 932 and this is not the cause of her episodes of collapse.  At this time I do not think she requires repeat cardiac imaging or an ischemia evaluation.  The episode was on associated with straining nausea or vomiting.  Previous episode was remarkably similar and occurred with her daughter while shopping sitting in food  court. Past Medical History:  Diagnosis Date  . Allergy   . Anemia    iron deficiency  . 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 Cataract   . Colon polyps   . CVA (cerebral vascular accident) ([place]) 02/2017  . Depression   . Hyperlipidemia   . Hypertension   . Osteoporosis 01/01/2015  . Rheumatoid arteritis   . Thyroid disease   . TIA (transient ischemic attack) 2016  . Urinary incontinence     Past Surgical History:  Procedure Laterality Date  . ABDOMINAL HYSTERECTOMY  1976  . CATARACT EXTRACTION Bilateral   . TUMOR REMOVAL  last was 1976   left ankle x3    Current Medications: Current Meds  Medication Sig  . Adalimumab (HUMIRA PEN [place]) Inject into the skin.   Marland Kitchen atorvastatin (LIPITOR) 80 MG tablet Take 1 tablet (80 mg total) by mouth daily.  . carvedilol (COREG) 12.5 MG tablet Take 1 tablet (12.5 mg total) by mouth 2 (two) times daily with a meal.  . clopidogrel (PLAVIX) 75 MG tablet Take 75 mg by mouth daily.   Mariane Baumgarten Calcium (STOOL SOFTENER PO) Take 1 tablet by mouth daily as needed (constipation).   Marland Kitchen leflunomide (ARAVA) 10 MG tablet Take 10 mg by mouth daily.  Marland Kitchen levothyroxine (SYNTHROID, LEVOTHROID) 100 MCG tablet TAKE 1 TABLET BY MOUTH EVERY DAY  . loratadine (CLARITIN) 10 MG tablet Take 10 mg by mouth daily as needed. Reported on 10/23/2015  . losartan (COZAAR) 100 MG tablet TAKE 1 TABLET BY MOUTH EVERY DAY  . PROLIA 60 MG/ML SOSY injection Inject 60 mg as directed every 6 (six) months.  . RESTASIS 0.05 % ophthalmic emulsion Place 1 drop into both eyes 2 (two) times daily.     Allergies:   Amlodipine and Iron   Social History   Socioeconomic History  . Marital status: Divorced    Spouse name: Not on file  . Number of children: 8  . Years of education: Not on file  . Highest education level: Not on file  Occupational History    Employer: RETIRED    Comment: Retired from [employer]  . Financial resource strain: Not on file  . Food  insecurity:    Worry: Not on file    Inability: Not on file  . Transportation needs:    Medical: Not on file    Non-medical: Not on file  Tobacco Use  . Smoking status: Current Some Day Smoker    Packs/day: 0.25    Years: 32.00    Pack years: 8.00    Types: Cigarettes  . Smokeless tobacco: Never Used  . Tobacco comment: 6 or 7 per day  Substance and Sexual Activity  . Alcohol use: No    Alcohol/week: 0.0 standard drinks  . Drug use: No  . Sexual activity: Never  Lifestyle  . Physical activity:    Days per week: Not on file    Minutes per session: Not on file  .  Stress: Not on file  Relationships  . Social connections:    Talks on phone: Not on file    Gets together: Not on file    Attends religious service: Not on file    Active member of club or organization: Not on file    Attends meetings of clubs or organizations: Not on file    Relationship status: Not on file  Other Topics Concern  . Not on file  Social History Narrative  . Not on file     Family History: The patient's family history includes Alcohol abuse in her brother; Arthritis in her other and sister; Cirrhosis in her brother; Cirrhosis (age of onset: 79) in her brother; Colitis in her daughter; Colon cancer in her father; Colon polyps in her daughter; Coronary artery disease in her other; Heart attack in her daughter; Heart disease (age of onset: 90) in her sister; Hyperlipidemia in her other; Hypertension in her father, other, sister, and son; Kidney disease in her son; Parkinson's disease in her mother; Stroke in her sister; Thyroid disease in her daughter; Ulcerative colitis in her mother.  ROS:   Review of Systems  Constitution: Positive for weight loss.  HENT: Negative.   Eyes: Negative.   Cardiovascular: Positive for syncope.  Respiratory: Negative.   Endocrine: Negative.   Hematologic/Lymphatic: Negative.   Skin: Negative.   Musculoskeletal: Positive for joint pain.  Gastrointestinal: Negative.    Genitourinary: Negative.   Neurological: Positive for dizziness.  Psychiatric/Behavioral: Negative.   Allergic/Immunologic: Negative.    Please see the history of present illness.     All other systems reviewed and are negative.  EKGs/Labs/Other Studies Reviewed:    The following studies were reviewed today:   EKG: I did not repeat an EKG today  EKG 06/06/2017 independent review sinus rhythm normal Echo 12/17/14 mild LVH EF 60-65% otherwise normal Holter 01/03/17infrequent APC's and PVC's  Recent Labs: 11/27/2017: ALT 8 02/24/2018: TSH 1.76 06/05/2018: Hemoglobin 11.9; Platelets 221 06/08/2018: BUN 12; Creatinine, Ser 0.65; Potassium 4.1; Sodium 142  Recent Lipid Panel    Component Value Date/Time   CHOL 215 (H) 06/08/2018 1316   CHOL 213 (H) 04/11/2018 0903   TRIG 108.0 06/08/2018 1316   HDL 56.30 06/08/2018 1316   HDL 53 04/11/2018 0903   CHOLHDL 4 06/08/2018 1316   VLDL 21.6 06/08/2018 1316   LDLCALC 138 (H) 06/08/2018 1316   LDLCALC 135 (H) 04/11/2018 0903    Physical Exam:    VS:  BP (!) 142/70 (BP Location: Right Arm, Patient Position: Sitting, Cuff Size: Normal)   Pulse 75   Ht 5\' 6"  (1.676 m)   Wt 114 lb (51.7 kg)   SpO2 98%   BMI 18.40 kg/m     Wt Readings from Last 3 Encounters:  06/24/18 114 lb (51.7 kg)  06/08/18 115 lb 6.4 oz (52.3 kg)  06/05/18 112 lb (50.8 kg)     GEN: frail joint deformities   in no acute distress HEENT: Normal NECK: No JVD; No carotid bruits LYMPHATICS: No lymphadenopathy CARDIAC: RRR, no murmurs, rubs, gallops RESPIRATORY:  Clear to auscultation without rales, wheezing or rhonchi  ABDOMEN: Soft, non-tender, non-distended MUSCULOSKELETAL:  No edema; No deformity  SKIN: Warm and dry NEUROLOGIC:  Alert and oriented x 3 PSYCHIATRIC:  Normal affect     Signed, Shirlee More, MD  06/24/2018 11:11 AM    [place]

## 2018-06-24 ENCOUNTER — Other Ambulatory Visit: Payer: Self-pay | Admitting: Family

## 2018-06-24 ENCOUNTER — Ambulatory Visit (INDEPENDENT_AMBULATORY_CARE_PROVIDER_SITE_OTHER): Payer: Medicare Other | Admitting: Cardiology

## 2018-06-24 ENCOUNTER — Encounter: Payer: Self-pay | Admitting: Cardiology

## 2018-06-24 VITALS — BP 142/70 | HR 75 | Ht 66.0 in | Wt 114.0 lb

## 2018-06-24 DIAGNOSIS — I10 Essential (primary) hypertension: Secondary | ICD-10-CM

## 2018-06-24 DIAGNOSIS — M059 Rheumatoid arthritis with rheumatoid factor, unspecified: Secondary | ICD-10-CM | POA: Diagnosis not present

## 2018-06-24 DIAGNOSIS — G459 Transient cerebral ischemic attack, unspecified: Secondary | ICD-10-CM | POA: Diagnosis not present

## 2018-06-24 DIAGNOSIS — R55 Syncope and collapse: Secondary | ICD-10-CM | POA: Diagnosis not present

## 2018-06-24 DIAGNOSIS — Z681 Body mass index (BMI) 19 or less, adult: Secondary | ICD-10-CM

## 2018-06-24 NOTE — Patient Instructions (Addendum)
Medication Instructions:  Your physician recommends that you continue on your current medications as directed. Please refer to the Current Medication list given to you today.   Labwork: None  Testing/Procedures: Your physician has recommended that you wear a zio patch monitor. Zio patch monitors are medical devices that record the heart's electrical activity. Doctors most often use these monitors to diagnose arrhythmias. Arrhythmias are problems with the speed or rhythm of the heartbeat. The monitor is a small, portable device. You can wear one while you do your normal daily activities. This is usually used to diagnose what is causing palpitations/syncope (passing out). Wear for 14 days.   Follow-Up: Your physician wants you to follow-up in: 3 months. You will receive a reminder letter in the mail two months in advance. If you don't receive a letter, please call our office to schedule the follow-up appointment.   If you need a refill on your cardiac medications before your next appointment, please call your pharmacy.   Thank you for choosing [facility]! Robyne Peers, RN [phone]      Syncope Syncope is when you lose temporarily pass out (faint). Signs that you may be about to pass out include:  Feeling dizzy or light-headed.  Feeling sick to your stomach (nauseous).  Seeing all white or all black.  Having cold, clammy skin.  If you passed out, get help right away. Call your local emergency services (911 in the [place] Do not drive yourself to the hospital. Follow these instructions at home: Pay attention to any changes in your symptoms. Take these actions to help with your condition:  Have someone stay with you until you feel stable.  Do not drive, use machinery, or play sports until your doctor says it is okay.  Keep all follow-up visits as told by your doctor. This is important.  If you start to feel like you might pass out, lie down right away and raise  (elevate) your feet above the level of your heart. Breathe deeply and steadily. Wait until all of the symptoms are gone.  Drink enough fluid to keep your pee (urine) clear or pale yellow.  If you are taking blood pressure or heart medicine, get up slowly and spend many minutes getting ready to sit and then stand. This can help with dizziness.  Take over-the-counter and prescription medicines only as told by your doctor.  Get help right away if:  You have a very bad headache.  You have unusual pain in your chest, tummy, or back.  You are bleeding from your mouth or rectum.  You have black or tarry poop (stool).  You have a very fast or uneven heartbeat (palpitations).  It hurts to breathe.  You pass out once or more than once.  You have jerky movements that you cannot control (seizure).  You are confused.  You have trouble walking.  You are very weak.  You have vision problems. These symptoms may be an emergency. Do not wait to see if the symptoms will go away. Get medical help right away. Call your local emergency services (911 in the [place] Do not drive yourself to the hospital. This information is not intended to replace advice given to you by your health care provider. Make sure you discuss any questions you have with your health care provider. Document Released: 04/13/2008 Document Revised: 04/02/2016 Document Reviewed: 07/10/2015 Elsevier Interactive Patient Education  [employer].

## 2018-06-27 ENCOUNTER — Other Ambulatory Visit: Payer: Medicare Other

## 2018-06-27 DIAGNOSIS — R55 Syncope and collapse: Secondary | ICD-10-CM

## 2018-06-27 MED ORDER — ATORVASTATIN CALCIUM 80 MG PO TABS: 80.0000 mg | ORAL_TABLET | Freq: Every day | ORAL | 1 refills | Status: DC

## 2018-06-27 NOTE — Addendum Note (Signed)
Addended by: Kelle Darting A on: 06/27/2018 01:26 PM   Modules accepted: Orders

## 2018-06-27 NOTE — Progress Notes (Signed)
I have reviewed note by Ms. Eulas Post, LPN, and agree with her documentation and plan- J Jessenya Berdan MD

## 2018-06-28 ENCOUNTER — Encounter: Payer: Self-pay | Admitting: Cardiology

## 2018-07-06 ENCOUNTER — Encounter: Payer: Self-pay | Admitting: Family

## 2018-07-06 ENCOUNTER — Ambulatory Visit (INDEPENDENT_AMBULATORY_CARE_PROVIDER_SITE_OTHER): Payer: Medicare Other | Admitting: Family

## 2018-07-06 VITALS — BP 160/78 | HR 72 | Temp 98.2°F | Resp 18 | Ht 66.0 in | Wt 116.8 lb

## 2018-07-06 DIAGNOSIS — I10 Essential (primary) hypertension: Secondary | ICD-10-CM | POA: Diagnosis not present

## 2018-07-06 DIAGNOSIS — E039 Hypothyroidism, unspecified: Secondary | ICD-10-CM

## 2018-07-06 DIAGNOSIS — H5712 Ocular pain, left eye: Secondary | ICD-10-CM | POA: Diagnosis not present

## 2018-07-06 MED ORDER — CARVEDILOL 25 MG PO TABS: 25.0000 mg | ORAL_TABLET | Freq: Two times a day (BID) | ORAL | 3 refills | Status: DC

## 2018-07-06 NOTE — Patient Instructions (Addendum)
Please increase coreg to 25mg  twice daily. We will work on getting you scheduled with the eye doctor.

## 2018-07-06 NOTE — Progress Notes (Signed)
Subjective:    [name], female    DOB: Oct 30, 1941, 77 y.o.   MRN: [id number]  HPI  Patient is a 77 yr old female who presents today for follow up.  HTN- Maintained on coreg, losartan.  BP Readings from Last 3 Encounters:  07/06/18 (!) 160/78  06/24/18 (!) 142/70  06/22/18 (!) 159/69   Eye problem- reports left eye is painful (scratchy/sore).  She reports that this has been irritated for approximately 3 months.  She has seen ophthalmology and reports they have given her multiple drops which have not helped.  She saw her rheumatologist and they recommended referral to a different ophthalmologist.   Hypothyroid- maintained on synthroid.  Lab Results  Component Value Date   TSH 1.76 02/24/2018    Review of Systems See HPI  Past Medical History:  Diagnosis Date  . Allergy   . Anemia    iron deficiency  . Cancer ([place]) 1995   vaginal  . Cataract   . Colon polyps   . CVA (cerebral vascular accident) ([place]) 02/2017  . Depression   . Hyperlipidemia   . Hypertension   . Osteoporosis 01/01/2015  . Rheumatoid arteritis   . Thyroid disease   . TIA (transient ischemic attack) 2016  . Urinary incontinence      Social History   Socioeconomic History  . Marital status: Divorced    Spouse name: Not on file  . Number of children: 8  . Years of education: Not on file  . Highest education level: Not on file  Occupational History    Employer: RETIRED    Comment: Retired from [employer]  . Financial resource strain: Not on file  . Food insecurity:    Worry: Not on file    Inability: Not on file  . Transportation needs:    Medical: Not on file    Non-medical: Not on file  Tobacco Use  . Smoking status: Current Some Day Smoker    Packs/day: 0.25    Years: 32.00    Pack years: 8.00    Types: Cigarettes  . Smokeless tobacco: Never Used  . Tobacco comment: 6 or 7 per day  Substance and Sexual Activity  . Alcohol use: No   Alcohol/week: 0.0 standard drinks  . Drug use: No  . Sexual activity: Never  Lifestyle  . Physical activity:    Days per week: Not on file    Minutes per session: Not on file  . Stress: Not on file  Relationships  . Social connections:    Talks on phone: Not on file    Gets together: Not on file    Attends religious service: Not on file    Active member of club or organization: Not on file    Attends meetings of clubs or organizations: Not on file    Relationship status: Not on file  . Intimate partner violence:    Fear of current or ex partner: Not on file    Emotionally abused: Not on file    Physically abused: Not on file    Forced sexual activity: Not on file  Other Topics Concern  . Not on file  Social History Narrative  . Not on file    Past Surgical History:  Procedure Laterality Date  . ABDOMINAL HYSTERECTOMY  1976  . CATARACT EXTRACTION Bilateral   . TUMOR REMOVAL  last was 1976   left ankle x3    Family History  Problem  Relation Age of Onset  . Hypertension Father   . Colon cancer Father   . Ulcerative colitis Mother   . Parkinson's disease Mother   . Heart disease Sister 43       CABG x 3  . Cirrhosis Brother   . Alcohol abuse Brother   . Colitis Daughter   . Colon polyps Daughter   . Heart attack Daughter   . Hypertension Son   . Kidney disease Son        transplant due to kidney problems after [facility]  . Arthritis Other   . Coronary artery disease Other   . Hyperlipidemia Other   . Hypertension Other   . Stroke Sister        died 7  . Arthritis Sister   . Hypertension Sister   . Thyroid disease Daughter        x 3  . Cirrhosis Brother 65       ETOH abuse    Allergies  Allergen Reactions  . Amlodipine Other (See Comments)    dizziness  . Iron Nausea And Vomiting    Can take infusions     Current Outpatient Medications on File Prior to Visit  Medication Sig Dispense Refill  . Adalimumab (HUMIRA PEN [place]) Inject into the skin.      Marland Kitchen atorvastatin (LIPITOR) 80 MG tablet Take 1 tablet (80 mg total) by mouth daily. 90 tablet 1  . carvedilol (COREG) 12.5 MG tablet Take 1 tablet (12.5 mg total) by mouth 2 (two) times daily with a meal. 60 tablet 3  . clopidogrel (PLAVIX) 75 MG tablet Take 75 mg by mouth daily.     Mariane Baumgarten Calcium (STOOL SOFTENER PO) Take 1 tablet by mouth daily as needed (constipation).     Marland Kitchen leflunomide (ARAVA) 10 MG tablet Take 10 mg by mouth daily.  3  . levothyroxine (SYNTHROID, LEVOTHROID) 100 MCG tablet TAKE 1 TABLET BY MOUTH EVERY DAY 30 tablet 1  . loratadine (CLARITIN) 10 MG tablet Take 10 mg by mouth daily as needed. Reported on 10/23/2015    . losartan (COZAAR) 100 MG tablet TAKE 1 TABLET BY MOUTH EVERY DAY 90 tablet 1  . PROLIA 60 MG/ML SOSY injection Inject 60 mg as directed every 6 (six) months.    . RESTASIS 0.05 % ophthalmic emulsion Place 1 drop into both eyes 2 (two) times daily.  4  . fluticasone (FLONASE) 50 MCG/ACT nasal spray Place 2 sprays into both nostrils daily. (Patient taking differently: Place 2 sprays into both nostrils daily as needed for allergies. ) 16 g 5   No current facility-administered medications on file prior to visit.     BP (!) 160/78 (BP Location: Right Arm, Cuff Size: Normal)   Pulse 72   Temp 98.2 F (36.8 C) (Oral)   Resp 18   Ht 5\' 6"  (1.676 m)   Wt 116 lb 12.8 oz (53 kg)   SpO2 97%   BMI 18.85 kg/m       Objective:   Physical Exam  Constitutional: She appears well-developed and well-nourished.  Eyes: Pupils are equal, round, and reactive to light.  Left sclera is injected.    Cardiovascular: Normal rate, regular rhythm and normal heart sounds.  No murmur heard. Pulmonary/Chest: Effort normal and breath sounds normal. No respiratory distress. She has no wheezes.  Psychiatric: She has a normal mood and affect. Her behavior is normal. Judgment and thought content normal.  Assessment & Plan:  L eye pain- concerning for iritis  given hx of autoimmune disease. Refer to ophthalmology for urgent referral in the next 1-2 days.   HTN- uncontrolled. Increase coreg to 25mg  bid.   Hypothyroid- clinically stable. Continue current dose of synthroid.

## 2018-07-08 ENCOUNTER — Other Ambulatory Visit: Payer: Self-pay | Admitting: Family

## 2018-07-14 ENCOUNTER — Other Ambulatory Visit: Payer: Medicare Other

## 2018-07-20 ENCOUNTER — Telehealth: Payer: Self-pay | Admitting: *Deleted

## 2018-07-20 ENCOUNTER — Ambulatory Visit: Payer: Medicare Other | Admitting: *Deleted

## 2018-07-20 ENCOUNTER — Encounter: Payer: Self-pay | Admitting: Neurology

## 2018-07-20 ENCOUNTER — Ambulatory Visit (INDEPENDENT_AMBULATORY_CARE_PROVIDER_SITE_OTHER): Payer: Medicare Other | Admitting: Neurology

## 2018-07-20 ENCOUNTER — Telehealth: Payer: Self-pay | Admitting: Neurology

## 2018-07-20 VITALS — BP 124/72 | HR 72 | Ht 66.0 in | Wt 113.0 lb

## 2018-07-20 DIAGNOSIS — R51 Headache: Secondary | ICD-10-CM | POA: Diagnosis not present

## 2018-07-20 DIAGNOSIS — R55 Syncope and collapse: Secondary | ICD-10-CM

## 2018-07-20 DIAGNOSIS — G459 Transient cerebral ischemic attack, unspecified: Secondary | ICD-10-CM | POA: Diagnosis not present

## 2018-07-20 DIAGNOSIS — R519 Headache, unspecified: Secondary | ICD-10-CM

## 2018-07-20 MED ORDER — DIVALPROEX SODIUM ER 500 MG PO TB24: 500.0000 mg | ORAL_TABLET | Freq: Every day | ORAL | 3 refills | Status: DC

## 2018-07-20 NOTE — Progress Notes (Signed)
[facility] [address] [place]. [place] [address] [phone]       OFFICE FOLLOW-UP NOTE  [name] Date of Birth:  [birth year] Medical Record Number:  [id number]   HPI: Last office visit 05/15/2015 : 60 year is an elderly lady seen today for first office follow-up visit following admission for TIA on 12/16/14. She presented with the transient speech output difficulties as well as confusion followed by some tremulousness. Symptoms lasted around 30-45 minutes and started resolving by the time she reached the hospital. On arrival she had no focal deficits. MRI scan of the brain showed no acute infarct and changes of small vessel disease. 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 Hemoglobin A1c was 6.6. Transthoracic echo was unremarkable. Carotid ultrasound showed no significant extra-axial stenosis. LDL cholesterol was elevated at 121. The episode was felt to also possibly represent a seizure and EEG was obtained which was normal. She started on aspirin which is tolerating well without bleeding or bruising. She today and found that she had another episode 3 years ago while in church she was walking outside she felt weak all over and she lost her voice and had to sit down for a little while. She was seen at [facility] where blood pressure was found to be significantly elevated. Last year in September she had only episode where she felt disoriented and confused and nauseous but did not pass out. Patient has not been evaluated with cardiac event monitor for arrhythmias yet. She remains on Lipitor she is tolerating well without side effects. She is also on aspirin without bleeding or bruising. Update 04/11/18 : She returns for follow-up after last visit with me nearly 3 years ago.  She states she has had no definite recurrent TIA or strokelike symptoms.  She remains on Plavix which is tolerating well without bruising  or bleeding.  She states her blood pressure is usually well controlled though today it is elevated in office slightly.  She has been increasing salt intake in her food and states her primary care physician asked her to do so.  She did have some lab work last month which showed low sodium of 134.  TSH was suppressed for which her Synthroid has been adjusted.  She complains of decreased appetite and weight loss but plans to discuss this with her primary physician.  She is tolerating Lipitor well without muscle aches and pains but cannot tell me when the last time her lipid profile was checked.  She did have an episode of syncope in January.  She was visiting a restaurant with her daughter and feels that she passed out.  She was incontinent of bowel.  She is previously had work-up for her syncopal events in 2016 and she had an EEG as well as carotid ultrasound which were unremarkable.  She also had a Holter monitor which was unrevealing.  She was seen by Dr. Ellouise [name] neurologist on 12/30/2015 but has not not had any new recurrent neurological symptoms. Update 07/20/2018 : he is referred back for neurological opinion by primary physician as she had one more episode of loss of consciousness about a month ago. She states she was sitting outside the house talking to a neighbor 1 out of that and she passed out and fell forwards. The neighbor tried to arouse her and patient was unresponsive for several minutes. By the time she regained consciousness the paramedics were there.  She felt lightheaded and disoriented for some time. She had urinated". There is no tongue bite or injury. She had no significant headache or focal extremity weakness at that time. The patient has also noticed new left temporal headaches with retro-orbital pain for the last month or so. She has seen her primary care physician and has been referred to ophthalmology at [facility] and has an appointment in a few weeks. 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 She does follow up with Dr. Trudie [name] for that. Patient has had multiple workup for the syncope versus seizure in the past with a 48-hour EEG interpreted 2017 as well as 2 other EEGs in February and July 2016 been normal. MRI scan of the brain except 2016 was also unremarkable. She has not had any recent brain imaging studies are EEG on neurovascular imaging done following the recent episode. ROS:   14 system review of systems is positive for weight loss,  Fatigue, blurred vision, eye pain, joint pain and swelling, allergies, runny nose, urination problems and all other systems negativeand all other systems negative PMH:  Past Medical History:  Diagnosis Date  . Allergy   . Anemia    iron deficiency  . 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 Cataract   . Colon polyps   . CVA (cerebral vascular accident) ([place]) 02/2017  . Depression   . Hyperlipidemia   . Hypertension   . Osteoporosis 01/01/2015  . Rheumatoid arteritis   . Thyroid disease   . TIA (transient ischemic attack) 2016  . Urinary incontinence     Social History:  Social History   Socioeconomic History  . Marital status: Divorced    Spouse name: Not on file  . Number of children: 8  . Years of education: Not on file  . Highest education level: Not on file  Occupational History    Employer: RETIRED    Comment: Retired from [employer]  . Financial resource strain: Not on file  . Food insecurity:    Worry: Not on file    Inability: Not on file  . Transportation needs:    Medical: Not on file    Non-medical: Not on file  Tobacco Use  . Smoking status: Current Some Day Smoker    Packs/day: 0.25    Years: 32.00    Pack years: 8.00    Types: Cigarettes  . Smokeless tobacco: Never Used  . Tobacco comment: 6 or 7 per day  Substance and Sexual Activity  . Alcohol use: No    Alcohol/week: 0.0 standard drinks  . Drug use: No  . Sexual activity: Never   Lifestyle  . Physical activity:    Days per week: Not on file    Minutes per session: Not on file  . Stress: Not on file  Relationships  . Social connections:    Talks on phone: Not on file    Gets together: Not on file    Attends religious service: Not on file    Active member of club or organization: Not on file    Attends meetings of clubs or organizations: Not on file    Relationship status: Not on file  . Intimate partner violence:    Fear of current or ex partner: Not on file    Emotionally abused: Not on file    Physically abused: Not on file    Forced sexual activity: Not on file  Other Topics Concern  . Not on file  Social History Narrative  . Not on file    Medications:   Current Outpatient Medications on File Prior to Visit  Medication Sig Dispense Refill  . Adalimumab (HUMIRA PEN ) Inject into the skin.     Marland Kitchen atorvastatin (LIPITOR) 80 MG tablet Take 1 tablet (80 mg total) by mouth daily. 90 tablet 1  . carvedilol (COREG) 25 MG tablet Take 1 tablet (25 mg total) by mouth 2 (two) times daily with a meal. 60 tablet 3  . clopidogrel (PLAVIX) 75 MG tablet Take 75 mg by mouth daily.     Nancy [name] Calcium (STOOL SOFTENER PO) Take 1 tablet by mouth daily as needed (constipation).     Marland Kitchen leflunomide (ARAVA) 10 MG tablet Take 10 mg by mouth daily.  3  . levothyroxine (SYNTHROID, LEVOTHROID) 100 MCG tablet TAKE 1 TABLET BY MOUTH EVERY DAY 30 tablet 1  . loratadine (CLARITIN) 10 MG tablet Take 10 mg by mouth daily as needed. Reported on 10/23/2015    . losartan (COZAAR) 100 MG tablet TAKE 1 TABLET BY MOUTH EVERY DAY 90 tablet 1  . PROLIA 60 MG/ML SOSY injection Inject 60 mg as directed every 6 (six) months.    . RESTASIS 0.05 % ophthalmic emulsion Place 1 drop into both eyes 2 (two) times daily.  4  . fluticasone (FLONASE) 50 MCG/ACT nasal spray Place 2 sprays into both nostrils daily. (Patient taking differently: Place 2 sprays into both nostrils daily as needed for  allergies. ) 16 g 5   No current facility-administered medications on file prior to visit.     Allergies:   Allergies  Allergen Reactions  . Amlodipine Other (See Comments)    dizziness  . Iron Nausea And Vomiting    Can take infusions     Physical Exam General:frail elderly african american lady, seated, in no evident distress Head: head normocephalic and atraumatic.  Neck: supple with no carotid or supraclavicular bruits Cardiovascular: regular rate and rhythm, no murmurs Musculoskeletal:rheumatoid deformityi n right hand fingers Skin:  no rash/petichiae Vascular:  Normal pulses all extremities Vitals:   07/20/18 1401  BP: 124/72  Pulse: 72   Neurologic Exam Mental Status: Awake and fully alert. Oriented to place and time. Recent and remote memory intact. Attention span, concentration and fund of knowledge appropriate. Mood and affect appropriate.  Cranial Nerves: Fundoscopic exam not done. Pupils equal, briskly reactive to light. Extraocular movements full without nystagmus. Visual fields full to confrontation. Hearing intact. Facial sensation intact. Face, tongue, palate moves normally and symmetrically.  Motor: Normal bulk and tone. Normal strength in all tested extremity muscles. Sensory.: intact to touch ,pinprick .position sensation.but diminished vibration over toes bilaterally  Coordination: Rapid alternating movements normal in all extremities. Finger-to-nose and heel-to-shin performed accurately bilaterally. Gait and Station: Arises from chair without difficulty. Stance is normal. Gait demonstrates normal stride length and balance . Able to heel, toe and tandem walk with great difficulty.  Reflexes: 1+ and symmetric. Toes downgoing.   NIHSS  0 Modified Rankin 0   ASSESSMENT: 77 year old African-American lady with remote history of left hemispheric TIA in February 2016 due to small vessel disease with vascular risk factors of hypertension, hyperlipidemia and age.   History of multiple episodes of syncope likely vasovagal with most recent being in January 2019.recent episode a month ago  with brief loss of consciousness followed by some confusion unclear as to syncope versus posterior circulation TIA. New complains of  left temporal and retro-orbital headaches    PLAN: I had a long discussion the patient and her daughter regarding her recent episode of loss of consciousness as well as new left temporal headaches and discussed my differential diagnosis which includes posterior separation TIA, temporal arteritis and seizures. Recommend further evaluation by checking MRI scan of the brain, MRA of the brain and neck, EEG, ESR and lipid profile and hemoglobin A1c. Trial of Depakote ER 500 mg daily for headache as well as seizure prophylaxis. She will continue Plavix for stroke prevention and maintain strict control of hypertension with blood pressure goal below 140/90 and lipids with LDL cholesterol goal below 70 mg percent. She will return for follow-up in 3 months or call earlier if necessary Greater than 50% of time during this 25 minute visit was spent on counseling,explanation of diagnosis,of syncope and TIA planning of further management, discussion with patient and family and coordination of care Antony Contras. MD Note: This document was prepared with digital dictation and possible smart phrase technology. Any transcriptional errors that result from this process are unintentional

## 2018-07-20 NOTE — Progress Notes (Signed)
Subjective:   Nancy [name] is a 77 y.o. female who presents for Medicare Annual (Subsequent) preventive examination.  Review of Systems: No ROS.  Medicare Wellness Visit. Additional risk factors are reflected in the social history. Cardiac Risk Factors include: advanced age (>78men, >40 women);dyslipidemia;hypertension Sleep patterns: no issues. Sometimes naps too late. Home Safety/Smoke Alarms: Feels safe in home. Smoke alarms in place. Lives alone. 1st floor apt. Step over tub with grab rail and emergency pull string.   Female:      Mammo- utd       Dexa scan- utd       CCS- No longer doing routine screening due to age.      Objective:     Vitals: BP (!) 142/80 (BP Location: Left Arm, Patient Position: Sitting, Cuff Size: Normal)   Pulse 70   Ht 5\' 6"  (1.676 m)   Wt 114 lb 3.2 oz (51.8 kg)   SpO2 94%   BMI 18.43 kg/m   Body mass index is 18.43 kg/m.  Advanced Directives 07/21/2018 11/27/2017 07/19/2017 10/07/2015 09/30/2015 09/24/2015 02/14/2015  Does Patient Have a Medical Advance Directive? Yes No Yes No No No No  Type of Paramedic of [place];Living will - [place];Living will - - - -  Copy of [place] in Chart? No - copy requested - No - copy requested - - - -  Would patient like information on creating a medical advance directive? - No - Patient declined - No - patient declined information - - No - patient declined information    Tobacco Social History   Tobacco Use  Smoking Status Current Some Day Smoker  . Packs/day: 0.25  . Years: 32.00  . Pack years: 8.00  . Types: Cigarettes  Smokeless Tobacco Never Used  Tobacco Comment   6 or 7 per day     Ready to quit: Not Answered Counseling given: Not Answered Comment: 6 or 7 per day   Clinical Intake: Pain : No/denies pain    Past Medical History:  Diagnosis Date  . Allergy   . Anemia    iron deficiency  . 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  Cataract   . Colon polyps   . CVA (cerebral vascular accident) ([place]) 02/2017  . Depression   . Hyperlipidemia   . Hypertension   . Osteoporosis 01/01/2015  . Rheumatoid arteritis   . Thyroid disease   . TIA (transient ischemic attack) 2016  . Urinary incontinence    Past Surgical History:  Procedure Laterality Date  . ABDOMINAL HYSTERECTOMY  1976  . CATARACT EXTRACTION Bilateral   . TUMOR REMOVAL  last was 1976   left ankle x3   Family History  Problem Relation Age of Onset  . Hypertension Father   . Colon cancer Father   . Ulcerative colitis Mother   . Parkinson's disease Mother   . Heart disease Sister 11       CABG x 3  . Cirrhosis Brother   . Alcohol abuse Brother   . Colitis Daughter   . Colon polyps Daughter   . Heart attack Daughter   . Hypertension Son   . Kidney disease Son        transplant due to kidney problems after [facility]  . Arthritis Other   . Coronary artery disease Other   . Hyperlipidemia Other   . Hypertension Other   . Stroke Sister  died 46  . Arthritis Sister   . Hypertension Sister   . Thyroid disease Daughter        x 3  . Cirrhosis Brother 60       ETOH abuse   Social History   Socioeconomic History  . Marital status: Divorced    Spouse name: Not on file  . Number of children: 8  . Years of education: Not on file  . Highest education level: Not on file  Occupational History    Employer: RETIRED    Comment: Retired from [employer]  . Financial resource strain: Not on file  . Food insecurity:    Worry: Not on file    Inability: Not on file  . Transportation needs:    Medical: Not on file    Non-medical: Not on file  Tobacco Use  . Smoking status: Current Some Day Smoker    Packs/day: 0.25    Years: 32.00    Pack years: 8.00    Types: Cigarettes  . Smokeless tobacco: Never Used  . Tobacco comment: 6 or 7 per day  Substance and Sexual Activity  . Alcohol use: No    Alcohol/week: 0.0  standard drinks  . Drug use: No  . Sexual activity: Never  Lifestyle  . Physical activity:    Days per week: Not on file    Minutes per session: Not on file  . Stress: Not on file  Relationships  . Social connections:    Talks on phone: Not on file    Gets together: Not on file    Attends religious service: Not on file    Active member of club or organization: Not on file    Attends meetings of clubs or organizations: Not on file    Relationship status: Not on file  Other Topics Concern  . Not on file  Social History Narrative  . Not on file    Outpatient Encounter Medications as of 07/21/2018  Medication Sig  . Adalimumab (HUMIRA PEN [place]) Inject into the skin.   Nancy [name] Kitchen atorvastatin (LIPITOR) 80 MG tablet Take 1 tablet (80 mg total) by mouth daily.  . carvedilol (COREG) 25 MG tablet Take 1 tablet (25 mg total) by mouth 2 (two) times daily with a meal.  . clopidogrel (PLAVIX) 75 MG tablet Take 75 mg by mouth daily.   . divalproex (DEPAKOTE ER) 500 MG 24 hr tablet Take 1 tablet (500 mg total) by mouth daily.  Mariane Baumgarten Calcium (STOOL SOFTENER PO) Take 1 tablet by mouth daily as needed (constipation).   Nancy [name] Kitchen leflunomide (ARAVA) 10 MG tablet Take 10 mg by mouth daily.  Nancy [name] Kitchen levothyroxine (SYNTHROID, LEVOTHROID) 100 MCG tablet TAKE 1 TABLET BY MOUTH EVERY DAY  . loratadine (CLARITIN) 10 MG tablet Take 10 mg by mouth daily as needed. Reported on 10/23/2015  . losartan (COZAAR) 100 MG tablet TAKE 1 TABLET BY MOUTH EVERY DAY  . PROLIA 60 MG/ML SOSY injection Inject 60 mg as directed every 6 (six) months.  . RESTASIS 0.05 % ophthalmic emulsion Place 1 drop into both eyes 2 (two) times daily.  . fluticasone (FLONASE) 50 MCG/ACT nasal spray Place 2 sprays into both nostrils daily. (Patient taking differently: Place 2 sprays into both nostrils daily as needed for allergies. )   No facility-administered encounter medications on file as of 07/21/2018.     Activities of Daily Living In your present  state of health, do you have any difficulty performing the following  activities: 07/21/2018  Hearing? N  Vision? N  Difficulty concentrating or making decisions? N  Walking or climbing stairs? Y  Dressing or bathing? N  Doing errands, shopping? N  Preparing Food and eating ? N  Using the Toilet? N  In the past six months, have you accidently leaked urine? Y  Do you have problems with loss of bowel control? N  Managing your Medications? N  Managing your Finances? N  Housekeeping or managing your Housekeeping? N  Some recent data might be hidden    Patient Care Team: Debbrah Alar, NP as PCP - General Phadke, Karsten Ro, MD as Consulting Physician (Endocrinology) Nevada Crane, MD as Referring Physician (Rheumatology) Bo Merino, MD as Consulting Physician (Rheumatology)    Assessment:   This is a routine wellness examination for [place]. Physical assessment deferred to PCP.  Exercise Activities and Dietary recommendations Current Exercise Habits: Home exercise routine, Time (Minutes): 60, Frequency (Times/Week): 2, Weekly Exercise (Minutes/Week): 120 Diet (meal preparation, eat out, water intake, caffeinated beverages, dairy products, fruits and vegetables): Soft diet due to new dentures  Goals    . renew strength and balance       Fall Risk Fall Risk  07/21/2018 04/11/2018 07/19/2017 06/18/2017 05/07/2016  Falls in the past year? No No No No No  Number falls in past yr: - - - - -  Injury with Fall? - - - - -    Depression Screen PHQ 2/9 Scores 07/21/2018 07/19/2017 06/18/2017 06/18/2017  PHQ - 2 Score 1 1 3 1   PHQ- 9 Score - 4 13 -     Cognitive Function MMSE - Mini Mental State Exam 07/21/2018 07/19/2017  Orientation to time 5 5  Orientation to Place 5 5  Registration 3 3  Attention/ Calculation 5 5  Recall 3 2  Language- name 2 objects 2 2  Language- repeat 1 0  Language- follow 3 step command 3 3  Language- read & follow direction 1 1  Write a sentence 1 1    Copy design 1 1  Total score 30 28        Immunization History  Administered Date(s) Administered  . Influenza Split 10/16/2011  . Influenza Whole 09/10/2009, 08/18/2010  . Influenza, High Dose Seasonal PF 09/10/2015, 08/31/2016, 07/19/2017  . Influenza,inj,Quad PF,6+ Mos 09/05/2014  . Influenza,inj,quad, With Preservative 07/19/2017  . Influenza-Unspecified 08/12/2012, 08/09/2013  . Pneumococcal Conjugate-13 09/05/2014  . Pneumococcal Polysaccharide-23 01/01/2009  . Td 12/26/2014    Screening Tests Health Maintenance  Topic Date Due  . INFLUENZA VACCINE  06/09/2018  . COLONOSCOPY  10/22/2020  . TETANUS/TDAP  12/26/2024  . DEXA SCAN  Completed  . PNA vac Low Risk Adult  Completed       Plan:    Please schedule your next medicare wellness visit with me in 1 yr.  Continue to eat heart healthy diet (full of fruits, vegetables, whole grains, lean protein, water--limit salt, fat, and sugar intake) and increase physical activity as tolerated.  Continue doing brain stimulating activities (puzzles, reading, adult coloring books, staying active) to keep memory sharp.   Bring a copy of your living will and/or healthcare power of attorney to your next office visit.    I have personally reviewed and noted the following in the patient's chart:   . Medical and social history . Use of alcohol, tobacco or illicit drugs  . Current medications and supplements . Functional ability and status . Nutritional status . Physical activity . Advanced directives .  List of other physicians . Hospitalizations, surgeries, and ER visits in previous 12 months . Vitals . Screenings to include cognitive, depression, and falls . Referrals and appointments  In addition, I have reviewed and discussed with patient certain preventive protocols, quality metrics, and best practice recommendations. A written personalized care plan for preventive services as well as general preventive health  recommendations were provided to patient.     Shela Nevin, [place]  07/21/2018

## 2018-07-20 NOTE — Patient Instructions (Signed)
I had a long discussion the patient and her daughter regarding her recent episode of loss of consciousness as well as new left temporal headaches and discussed my differential diagnosis which includes posterior separation TIA, temporal arteritis and seizures. Recommend further evaluation by checking MRI scan of the brain, MRA of the brain and neck, EEG, ESR and lipid profile and hemoglobin A1c. Trial of Depakote ER 500 mg daily for headache as well as seizure prophylaxis. She will continue Plavix for stroke prevention and maintain strict control of hypertension with blood pressure goal below 140/90 and lipids with LDL cholesterol goal below 70 mg percent. She will return for follow-up in 3 months or call earlier if necessary

## 2018-07-20 NOTE — Telephone Encounter (Signed)
Left message for patient and patient's daughter, Neoma Laming (per DPR) to return call in regards to 14 day ZIO monitor results and to discuss further recommendations.

## 2018-07-20 NOTE — Telephone Encounter (Signed)
[employer] Medicare/medicaid order sent to [facility]. They will reach out to the pt to schedule.  °

## 2018-07-21 ENCOUNTER — Encounter: Payer: Self-pay | Admitting: *Deleted

## 2018-07-21 ENCOUNTER — Other Ambulatory Visit: Payer: Self-pay

## 2018-07-21 ENCOUNTER — Ambulatory Visit (INDEPENDENT_AMBULATORY_CARE_PROVIDER_SITE_OTHER): Payer: Medicare Other | Admitting: *Deleted

## 2018-07-21 ENCOUNTER — Telehealth: Payer: Self-pay | Admitting: Neurology

## 2018-07-21 VITALS — BP 142/80 | HR 70 | Ht 66.0 in | Wt 114.2 lb

## 2018-07-21 DIAGNOSIS — Z0000 Encounter for general adult medical examination without abnormal findings: Secondary | ICD-10-CM | POA: Diagnosis not present

## 2018-07-21 LAB — SEDIMENTATION RATE: Sed Rate: 22 mm/hr (ref 0–40)

## 2018-07-21 LAB — HEMOGLOBIN A1C
Est. average glucose Bld gHb Est-mCnc: 111 mg/dL
Hgb A1c MFr Bld: 5.5 % (ref 4.8–5.6)

## 2018-07-21 LAB — LIPID PANEL
Chol/HDL Ratio: 3.4 ratio (ref 0.0–4.4)
Cholesterol, Total: 150 mg/dL (ref 100–199)
HDL: 44 mg/dL (ref >39)
LDL Calculated: 85 mg/dL (ref 0–99)
Triglycerides: 105 mg/dL (ref 0–149)
VLDL Cholesterol Cal: 21 mg/dL (ref 5–40)

## 2018-07-21 MED ORDER — DIVALPROEX SODIUM ER 500 MG PO TB24: 500.0000 mg | ORAL_TABLET | Freq: Every day | ORAL | 3 refills | Status: DC

## 2018-07-21 NOTE — Telephone Encounter (Signed)
Pts daughter Hilda Blades on [place] requesting pts divalproex (DEPAKOTE ER) 500 MG 24 hr tablet sent through [facility]

## 2018-07-21 NOTE — Progress Notes (Signed)
I have reviewed the MWE note by Ms. Vevelyn Royals today and agree with her documentation  J Clarkson Rosselli MD

## 2018-07-21 NOTE — Telephone Encounter (Signed)
Refill submitted per request.

## 2018-07-21 NOTE — Patient Instructions (Signed)
Please schedule your next medicare wellness visit with me in 1 yr.  Continue to eat heart healthy diet (full of fruits, vegetables, whole grains, lean protein, water--limit salt, fat, and sugar intake) and increase physical activity as tolerated.  Continue doing brain stimulating activities (puzzles, reading, adult coloring books, staying active) to keep memory sharp.   Bring a copy of your living will and/or healthcare power of attorney to your next office visit.   Nancy [name] , Thank you for taking time to come for your Medicare Wellness Visit. I appreciate your ongoing commitment to your health goals. Please review the following plan we discussed and let me know if I can assist you in the future.   These are the goals we discussed: Goals    . renew strength and balance       This is a list of the screening recommended for you and due dates:  Health Maintenance  Topic Date Due  . Flu Shot  06/09/2018  . Colon Cancer Screening  10/22/2020  . Tetanus Vaccine  12/26/2024  . DEXA scan (bone density measurement)  Completed  . Pneumonia vaccines  Completed    Health Maintenance for Postmenopausal Women Menopause is a normal process in which your reproductive ability comes to an end. This process happens gradually over a span of months to years, usually between the ages of 78 and 21. Menopause is complete when you have missed 12 consecutive menstrual periods. It is important to talk with your health care provider about some of the most common conditions that affect postmenopausal women, such as heart disease, cancer, and bone loss (osteoporosis). Adopting a healthy lifestyle and getting preventive care can help to promote your health and wellness. Those actions can also lower your chances of developing some of these common conditions. What should I know about menopause? During menopause, you may experience a number of symptoms, such as:  Moderate-to-severe hot flashes.  Night  sweats.  Decrease in sex drive.  Mood swings.  Headaches.  Tiredness.  Irritability.  Memory problems.  Insomnia.  Choosing to treat or not to treat menopausal changes is an individual decision that you make with your health care provider. What should I know about hormone replacement therapy and supplements? Hormone therapy products are effective for treating symptoms that are associated with menopause, such as hot flashes and night sweats. Hormone replacement carries certain risks, especially as you become older. If you are thinking about using estrogen or estrogen with progestin treatments, discuss the benefits and risks with your health care provider. What should I know about heart disease and stroke? Heart disease, heart attack, and stroke become more likely as you age. This may be due, in part, to the hormonal changes that your body experiences during menopause. These can affect how your body processes dietary fats, triglycerides, and cholesterol. Heart attack and stroke are both medical emergencies. There are many things that you can do to help prevent heart disease and stroke:  Have your blood pressure checked at least every 1-2 years. High blood pressure causes heart disease and increases the risk of stroke.  If you are 60-73 years old, ask your health care provider if you should take aspirin to prevent a heart attack or a stroke.  Do not use any tobacco products, including cigarettes, chewing tobacco, or electronic cigarettes. If you need help quitting, ask your health care provider.  It is important to eat a healthy diet and maintain a healthy weight. ? Be sure to include  plenty of vegetables, fruits, low-fat dairy products, and lean protein. ? Avoid eating foods that are high in solid fats, added sugars, or salt (sodium).  Get regular exercise. This is one of the most important things that you can do for your health. ? Try to exercise for at least 150 minutes each week.  The type of exercise that you do should increase your heart rate and make you sweat. This is known as moderate-intensity exercise. ? Try to do strengthening exercises at least twice each week. Do these in addition to the moderate-intensity exercise.  Know your numbers.Ask your health care provider to check your cholesterol and your blood glucose. Continue to have your blood tested as directed by your health care provider.  What should I know about cancer screening? There are several types of cancer. Take the following steps to reduce your risk and to catch any cancer development as early as possible. Breast Cancer  Practice breast self-awareness. ? This means understanding how your breasts normally appear and feel. ? It also means doing regular breast self-exams. Let your health care provider know about any changes, no matter how small.  If you are 16 or older, have a clinician do a breast exam (clinical breast exam or CBE) every year. Depending on your age, family history, and medical history, it may be recommended that you also have a yearly breast X-ray (mammogram).  If you have a family history of breast cancer, talk with your health care provider about genetic screening.  If you are at high risk for breast cancer, talk with your health care provider about having an MRI and a mammogram every year.  Breast cancer (BRCA) gene test is recommended for women who have family members with BRCA-related cancers. Results of the assessment will determine the need for genetic counseling and BRCA1 and for BRCA2 testing. BRCA-related cancers include these types: ? Breast. This occurs in males or females. ? Ovarian. ? Tubal. This may also be called fallopian tube cancer. ? Cancer of the abdominal or pelvic lining (peritoneal cancer). ? Prostate. ? Pancreatic.  Cervical, Uterine, and Ovarian Cancer Your health care provider may recommend that you be screened regularly for cancer of the pelvic  organs. These include your ovaries, uterus, and vagina. This screening involves a pelvic exam, which includes checking for microscopic changes to the surface of your cervix (Pap test).  For women ages 21-65, health care providers may recommend a pelvic exam and a Pap test every three years. For women ages 59-65, they may recommend the Pap test and pelvic exam, combined with testing for human papilloma virus (HPV), every five years. Some types of HPV increase your risk of cervical cancer. Testing for HPV may also be done on women of any age who have unclear Pap test results.  Other health care providers may not recommend any screening for nonpregnant women who are considered low risk for pelvic cancer and have no symptoms. Ask your health care provider if a screening pelvic exam is right for you.  If you have had past treatment for cervical cancer or a condition that could lead to cancer, you need Pap tests and screening for cancer for at least 20 years after your treatment. If Pap tests have been discontinued for you, your risk factors (such as having a new sexual partner) need to be reassessed to determine if you should start having screenings again. Some women have medical problems that increase the chance of getting cervical cancer. In these cases, your  health care provider may recommend that you have screening and Pap tests more often.  If you have a family history of uterine cancer or ovarian cancer, talk with your health care provider about genetic screening.  If you have vaginal bleeding after reaching menopause, tell your health care provider.  There are currently no reliable tests available to screen for ovarian cancer.  Lung Cancer Lung cancer screening is recommended for adults 32-2 years old who are at high risk for lung cancer because of a history of smoking. A yearly low-dose CT scan of the lungs is recommended if you:  Currently smoke.  Have a history of at least 30 pack-years of  smoking and you currently smoke or have quit within the past 15 years. A pack-year is smoking an average of one pack of cigarettes per day for one year.  Yearly screening should:  Continue until it has been 15 years since you quit.  Stop if you develop a health problem that would prevent you from having lung cancer treatment.  Colorectal Cancer  This type of cancer can be detected and can often be prevented.  Routine colorectal cancer screening usually begins at age 36 and continues through age 7.  If you have risk factors for colon cancer, your health care provider may recommend that you be screened at an earlier age.  If you have a family history of colorectal cancer, talk with your health care provider about genetic screening.  Your health care provider may also recommend using home test kits to check for hidden blood in your stool.  A small camera at the end of a tube can be used to examine your colon directly (sigmoidoscopy or colonoscopy). This is done to check for the earliest forms of colorectal cancer.  Direct examination of the colon should be repeated every 5-10 years until age 45. However, if early forms of precancerous polyps or small growths are found or if you have a family history or genetic risk for colorectal cancer, you may need to be screened more often.  Skin Cancer  Check your skin from head to toe regularly.  Monitor any moles. Be sure to tell your health care provider: ? About any new moles or changes in moles, especially if there is a change in a mole's shape or color. ? If you have a mole that is larger than the size of a pencil eraser.  If any of your family members has a history of skin cancer, especially at a young age, talk with your health care provider about genetic screening.  Always use sunscreen. Apply sunscreen liberally and repeatedly throughout the day.  Whenever you are outside, protect yourself by wearing long sleeves, pants, a wide-brimmed  hat, and sunglasses.  What should I know about osteoporosis? Osteoporosis is a condition in which bone destruction happens more quickly than new bone creation. After menopause, you may be at an increased risk for osteoporosis. To help prevent osteoporosis or the bone fractures that can happen because of osteoporosis, the following is recommended:  If you are 61-2 years old, get at least 1,000 mg of calcium and at least 600 mg of vitamin D per day.  If you are older than age 12 but younger than age 59, get at least 1,200 mg of calcium and at least 600 mg of vitamin D per day.  If you are older than age 72, get at least 1,200 mg of calcium and at least 800 mg of vitamin D per day.  Smoking and excessive alcohol intake increase the risk of osteoporosis. Eat foods that are rich in calcium and vitamin D, and do weight-bearing exercises several times each week as directed by your health care provider. What should I know about how menopause affects my mental health? Depression may occur at any age, but it is more common as you become older. Common symptoms of depression include:  Low or sad mood.  Changes in sleep patterns.  Changes in appetite or eating patterns.  Feeling an overall lack of motivation or enjoyment of activities that you previously enjoyed.  Frequent crying spells.  Talk with your health care provider if you think that you are experiencing depression. What should I know about immunizations? It is important that you get and maintain your immunizations. These include:  Tetanus, diphtheria, and pertussis (Tdap) booster vaccine.  Influenza every year before the flu season begins.  Pneumonia vaccine.  Shingles vaccine.  Your health care provider may also recommend other immunizations. This information is not intended to replace advice given to you by your health care provider. Make sure you discuss any questions you have with your health care provider. Document Released:  12/18/2005 Document Revised: 05/15/2016 Document Reviewed: 07/30/2015 Elsevier Interactive Patient Education  2018 [employer] Inc.  

## 2018-07-25 ENCOUNTER — Ambulatory Visit (INDEPENDENT_AMBULATORY_CARE_PROVIDER_SITE_OTHER): Payer: Medicare Other

## 2018-07-25 DIAGNOSIS — R519 Headache, unspecified: Secondary | ICD-10-CM

## 2018-07-25 DIAGNOSIS — R55 Syncope and collapse: Secondary | ICD-10-CM | POA: Diagnosis not present

## 2018-07-25 DIAGNOSIS — R51 Headache: Secondary | ICD-10-CM

## 2018-07-25 DIAGNOSIS — G459 Transient cerebral ischemic attack, unspecified: Secondary | ICD-10-CM

## 2018-07-28 ENCOUNTER — Ambulatory Visit
Admission: RE | Admit: 2018-07-28 | Discharge: 2018-07-28 | Disposition: A | Discharge status: Home / Self Care | Payer: Medicare Other | Source: Ambulatory Visit | Attending: Neurology | Admitting: Neurology

## 2018-07-28 DIAGNOSIS — G459 Transient cerebral ischemic attack, unspecified: Secondary | ICD-10-CM

## 2018-07-28 MED ORDER — GADOBENATE DIMEGLUMINE 529 MG/ML IV SOLN
10.0000 mL | Freq: Once | INTRAVENOUS | Status: AC | PRN
  Administered 2018-07-28: 10 mL via INTRAVENOUS

## 2018-08-02 ENCOUNTER — Ambulatory Visit: Payer: Medicare Other | Admitting: Cardiology

## 2018-08-03 ENCOUNTER — Telehealth: Payer: Self-pay

## 2018-08-03 ENCOUNTER — Encounter: Payer: Self-pay | Admitting: Family

## 2018-08-03 ENCOUNTER — Ambulatory Visit (INDEPENDENT_AMBULATORY_CARE_PROVIDER_SITE_OTHER): Payer: Medicare Other | Admitting: Family

## 2018-08-03 VITALS — BP 172/84 | HR 70 | Temp 97.8°F | Resp 16 | Ht 66.0 in | Wt 115.2 lb

## 2018-08-03 DIAGNOSIS — R519 Headache, unspecified: Secondary | ICD-10-CM

## 2018-08-03 DIAGNOSIS — H209 Unspecified iridocyclitis: Secondary | ICD-10-CM | POA: Diagnosis not present

## 2018-08-03 DIAGNOSIS — R51 Headache: Secondary | ICD-10-CM

## 2018-08-03 DIAGNOSIS — I10 Essential (primary) hypertension: Secondary | ICD-10-CM

## 2018-08-03 DIAGNOSIS — R220 Localized swelling, mass and lump, head: Secondary | ICD-10-CM

## 2018-08-03 DIAGNOSIS — J019 Acute sinusitis, unspecified: Secondary | ICD-10-CM | POA: Diagnosis not present

## 2018-08-03 DIAGNOSIS — Z23 Encounter for immunization: Secondary | ICD-10-CM

## 2018-08-03 MED ORDER — AMOXICILLIN-POT CLAVULANATE 875-125 MG PO TABS: 1.0000 | ORAL_TABLET | Freq: Two times a day (BID) | ORAL | 0 refills | Status: DC

## 2018-08-03 NOTE — Telephone Encounter (Signed)
-----   Message from Garvin Fila, MD sent at 08/03/2018  8:24 AM EDT ----- Nancy [name] inform the patient that EEG study was normal

## 2018-08-03 NOTE — Telephone Encounter (Signed)
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 These findings are unchanged compared with previous CT angiogram from 2018. No new or worrisome findings. Pt verbalized understanding.  Notes recorded by Marval Regal, RN on 08/03/2018 at 11:35 AM EDT RN call patient and explain her EEG was normal. Pt verbalized understanding. ------

## 2018-08-03 NOTE — Patient Instructions (Addendum)
Please increase coreg from 12.5 to 25mg  once daily. Keep your upcoming appointment with ophthalmology for your eye. Apply anbesol twice daily to tender gum area.  Arrange a follow up appointment with your dentist.  Begin augmentin (antibiotic) for possible sinus infection.

## 2018-08-03 NOTE — Telephone Encounter (Signed)
RN call patient that the MRI scan of the brain shows no new or worrisome findings. 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 No significant change compared with previous MRI from April 2018. PT verbalized understanding. Pt had her daughter write down the results.

## 2018-08-03 NOTE — Progress Notes (Signed)
Subjective:    [name], female    DOB: [birth year], 77 y.o.   MRN: [id number]  HPI  Patient is a 77 yr old female who presents today for follow up.  HTN- last visit bp was uncontrolled and we increased her coreg dose.  She is also on losartan.   BP Readings from Last 3 Encounters:  08/03/18 (!) 172/84  07/21/18 (!) 142/80  07/20/18 124/72   Gum pain- she has not seen a dentist.  Plans to go to [facility] when her car is fixed.  Had a dental extraction in December.  Seemed to be healing well. Now hurting x 2 weeks.   HA- reports HA continues.  Reports that HA "eased some " with naproxen.  Reports "a lot of nasal drainage. Reports left frontal sinus tenderness. Denies fever.  Energy is low. Saw neurology. 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 This appeared unchanged since 2018. ESR was normal.   Irititis- was referred to ophthalmology last visit.  Reports that they wanted to have her seen in [place], she wanted to wait to be seen in [place] due to transportation issues. She will see them on 08/09/18.  Reports she continues to have left eye pain.     Review of Systems See HPI  Past Medical History:  Diagnosis Date  . Allergy   . Anemia    iron deficiency  . 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 Cataract   . Colon polyps   . CVA (cerebral vascular accident) ([place]) 02/2017  . Depression   . Hyperlipidemia   . Hypertension   . Osteoporosis 01/01/2015  . Rheumatoid arteritis   . Thyroid disease   . TIA (transient ischemic attack) 2016  . Urinary incontinence      Social History   Socioeconomic History  . Marital status: Divorced    Spouse name: Not on file  . Number of children: 8  . Years of education: Not on file  . Highest education level: Not on file  Occupational History    Employer: RETIRED    Comment: Retired from [employer]  . Financial resource  strain: Not on file  . Food insecurity:    Worry: Not on file    Inability: Not on file  . Transportation needs:    Medical: Not on file    Non-medical: Not on file  Tobacco Use  . Smoking status: Current Some Day Smoker    Packs/day: 0.25    Years: 32.00    Pack years: 8.00    Types: Cigarettes  . Smokeless tobacco: Never Used  . Tobacco comment: 6 or 7 per day  Substance and Sexual Activity  . Alcohol use: No    Alcohol/week: 0.0 standard drinks  . Drug use: No  . Sexual activity: Never  Lifestyle  . Physical activity:    Days per week: Not on file    Minutes per session: Not on file  . Stress: Not on file  Relationships  . Social connections:    Talks on phone: Not on file    Gets together: Not on file    Attends religious service: Not on file    Active member of club or organization: Not on file    Attends meetings of clubs or organizations: Not on file    Relationship status: Not on file  . Intimate partner  violence:    Fear of current or ex partner: Not on file    Emotionally abused: Not on file    Physically abused: Not on file    Forced sexual activity: Not on file  Other Topics Concern  . Not on file  Social History Narrative  . Not on file    Past Surgical History:  Procedure Laterality Date  . ABDOMINAL HYSTERECTOMY  1976  . CATARACT EXTRACTION Bilateral   . 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 Hypertension Father   . Colon cancer Father   . Ulcerative colitis Mother   . Parkinson's disease Mother   . Heart disease Sister 11       CABG x 3  . Cirrhosis Brother   . Alcohol abuse Brother   . Colitis Daughter   . Colon polyps Daughter   . Heart attack Daughter   . Hypertension Son   . Kidney disease Son        transplant due to kidney problems after [facility]  . Arthritis Other   . Coronary artery disease Other   . Hyperlipidemia Other   . Hypertension Other   . Stroke Sister         died 49  . Arthritis Sister   . Hypertension Sister   . Thyroid disease Daughter        x 3  . Cirrhosis Brother 65       ETOH abuse    Allergies  Allergen Reactions  . Amlodipine Other (See Comments)    dizziness  . Iron Nausea And Vomiting    Can take infusions     Current Outpatient Medications on File Prior to Visit  Medication Sig Dispense Refill  . Adalimumab (HUMIRA PEN [place]) Inject into the skin.     Marland Kitchen atorvastatin (LIPITOR) 80 MG tablet Take 1 tablet (80 mg total) by mouth daily. 90 tablet 1  . carvedilol (COREG) 25 MG tablet Take 1 tablet (25 mg total) by mouth 2 (two) times daily with a meal. 60 tablet 3  . clopidogrel (PLAVIX) 75 MG tablet Take 75 mg by mouth daily.     . divalproex (DEPAKOTE ER) 500 MG 24 hr tablet Take 1 tablet (500 mg total) by mouth daily. 30 tablet 3  . Docusate Calcium (STOOL SOFTENER PO) Take 1 tablet by mouth daily as needed (constipation).     Marland Kitchen leflunomide (ARAVA) 10 MG tablet Take 10 mg by mouth daily.  3  . levothyroxine (SYNTHROID, LEVOTHROID) 100 MCG tablet TAKE 1 TABLET BY MOUTH EVERY DAY 30 tablet 1  . loratadine (CLARITIN) 10 MG tablet Take 10 mg by mouth daily as needed. Reported on 10/23/2015    . losartan (COZAAR) 100 MG tablet TAKE 1 TABLET BY MOUTH EVERY DAY 90 tablet 1  . PROLIA 60 MG/ML SOSY injection Inject 60 mg as directed every 6 (six) months.    . RESTASIS 0.05 % ophthalmic emulsion Place 1 drop into both eyes 2 (two) times daily.  4  . fluticasone (FLONASE) 50 MCG/ACT nasal spray Place 2 sprays into both nostrils daily. (Patient taking differently: Place 2 sprays into both nostrils daily as needed for allergies. ) 16 g 5   No current facility-administered medications on file prior to visit.     BP (!) 172/84 (BP Location: Right Arm, Patient Position: Sitting, Cuff Size: Small)   Pulse 70  Temp 97.8 F (36.6 C) (Oral)   Resp 16   Ht 5' 6"  (1.676 m)   Wt 115 lb 3.2 oz (52.3 kg)   SpO2 98%   BMI 18.59 kg/m        Objective:   Physical Exam  Constitutional: She is oriented to person, place, and time. She appears well-developed and well-nourished.  HENT:  Head: Normocephalic and atraumatic.  Right Ear: Tympanic membrane and ear canal normal.  Left Ear: Tympanic membrane and ear canal normal.  Nose: Right sinus exhibits no maxillary sinus tenderness and no frontal sinus tenderness. Left sinus exhibits maxillary sinus tenderness and frontal sinus tenderness.  + gum swelling over edentulous area of left lower gumline.  Superficial ulcer noted in this area as well  Eyes:  Left sclera mildly injected- less than last visit  Cardiovascular: Normal rate, regular rhythm and normal heart sounds.  No murmur heard. Pulmonary/Chest: Effort normal and breath sounds normal. No respiratory distress. She has no wheezes.  Neurological: She is alert and oriented to person, place, and time. She exhibits normal muscle tone.  Skin: Skin is warm and dry.  Psychiatric: She has a normal mood and affect. Her behavior is normal. Judgment and thought content normal.          Assessment & Plan:  HTN- uncontrolled. She did not increase coreg last visit. Increase from 12.5 to 4m.  Iritis- pt is advised to keep upcoming appointment with eye doctor.   Gum swelling- advised follow up with dentist and topical anbesol for comfort  Sinusitis-rx with augmentin.

## 2018-08-05 ENCOUNTER — Telehealth: Payer: Self-pay | Admitting: *Deleted

## 2018-08-05 NOTE — Telephone Encounter (Signed)
Spoke with patient and informed her that her screening test for diabetes and sed rate are normal. Her cholesterol is improved from 1 month ago,  but it is still slightly high. Advised she see her primary physician for medication adjustment.  Advised her that her PCP can see results of Dr Clydene Fake labs.  She stated that Dr Leonie Man recommended she increase Lipitor to 80 mg, however her PCP had told her to stay at 40 mg. She stated she only recently was advised by PCP to increase it to 80 mg, so she thinks that is why her cholesterol is still high. She feels she hasn't been on increased dose long enough. She has FU with PCP in a few weeks and will discuss. She verbalized understanding, appreciation of call.

## 2018-08-30 ENCOUNTER — Encounter: Payer: Self-pay | Admitting: Family

## 2018-08-30 ENCOUNTER — Ambulatory Visit (INDEPENDENT_AMBULATORY_CARE_PROVIDER_SITE_OTHER): Payer: Medicare Other | Admitting: Family

## 2018-08-30 VITALS — BP 130/66 | HR 67 | Temp 98.4°F | Resp 16 | Ht 65.0 in | Wt 116.0 lb

## 2018-08-30 DIAGNOSIS — I10 Essential (primary) hypertension: Secondary | ICD-10-CM

## 2018-08-30 DIAGNOSIS — Z720 Tobacco use: Secondary | ICD-10-CM

## 2018-08-30 MED ORDER — CARVEDILOL 25 MG PO TABS: ORAL_TABLET | ORAL | 3 refills | Status: DC

## 2018-08-30 NOTE — Progress Notes (Signed)
Subjective:    [name], female    DOB: 08/28/1941, 77 y.o.   MRN: [id number]  HPI  Patient is a 77 yr old female who presents today for follow up.  HTN- Last visit we increased the dose of her coreg from 12.5mg  bid to 25mg  bid.  Reports that she has felt light headed and off balance at night.  Reports that for the last 3-4 nights she has been skipping the dose in the evening.   BP Readings from Last 3 Encounters:  08/30/18 130/66  08/03/18 (!) 172/84  07/21/18 (!) 142/80   Tobacco abuse- smokes 8 cigarettes a day.  Wants help quitting.    Review of Systems See HPI  Past Medical History:  Diagnosis Date  . Allergy   . Anemia    iron deficiency  . Cancer ([place]) 1995   vaginal  . Cataract   . Colon polyps   . CVA (cerebral vascular accident) ([place]) 02/2017  . Depression   . Hyperlipidemia   . Hypertension   . Osteoporosis 01/01/2015  . Rheumatoid arteritis ([place])   . Thyroid disease   . TIA (transient ischemic attack) 2016  . Urinary incontinence      Social History   Socioeconomic History  . Marital status: Divorced    Spouse name: Not on file  . Number of children: 8  . Years of education: Not on file  . Highest education level: Not on file  Occupational History    Employer: RETIRED    Comment: Retired from [employer]  . Financial resource strain: Not on file  . Food insecurity:    Worry: Not on file    Inability: Not on file  . Transportation needs:    Medical: Not on file    Non-medical: Not on file  Tobacco Use  . Smoking status: Current Some Day Smoker    Packs/day: 0.25    Years: 32.00    Pack years: 8.00    Types: Cigarettes  . Smokeless tobacco: Never Used  . Tobacco comment: 6 or 7 per day  Substance and Sexual Activity  . Alcohol use: No    Alcohol/week: 0.0 standard drinks  . Drug use: No  . Sexual activity: Never  Lifestyle  . Physical activity:    Days per week: Not on file    Minutes per  session: Not on file  . Stress: Not on file  Relationships  . Social connections:    Talks on phone: Not on file    Gets together: Not on file    Attends religious service: Not on file    Active member of club or organization: Not on file    Attends meetings of clubs or organizations: Not on file    Relationship status: Not on file  . Intimate partner violence:    Fear of current or ex partner: Not on file    Emotionally abused: Not on file    Physically abused: Not on file    Forced sexual activity: Not on file  Other Topics Concern  . Not on file  Social History Narrative  . Not on file    Past Surgical History:  Procedure Laterality Date  . ABDOMINAL HYSTERECTOMY  1976  . CATARACT EXTRACTION Bilateral   . 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 Hypertension Father   . Colon cancer  Father   . Ulcerative colitis Mother   . Parkinson's disease Mother   . Heart disease Sister 12       CABG x 3  . Cirrhosis Brother   . Alcohol abuse Brother   . Colitis Daughter   . Colon polyps Daughter   . Heart attack Daughter   . Hypertension Son   . Kidney disease Son        transplant due to kidney problems after [facility]  . Arthritis Other   . Coronary artery disease Other   . Hyperlipidemia Other   . Hypertension Other   . Stroke Sister        died 56  . Arthritis Sister   . Hypertension Sister   . Thyroid disease Daughter        x 3  . Cirrhosis Brother 65       ETOH abuse    Allergies  Allergen Reactions  . Amlodipine Other (See Comments)    dizziness  . Iron Nausea And Vomiting    Can take infusions     Current Outpatient Medications on File Prior to Visit  Medication Sig Dispense Refill  . Adalimumab (HUMIRA PEN [place]) Inject into the skin.     Marland Kitchen amoxicillin-clavulanate (AUGMENTIN) 875-125 MG tablet Take 1 tablet by mouth 2 (two) times daily. 20 tablet 0  . atorvastatin (LIPITOR) 80 MG tablet  Take 1 tablet (80 mg total) by mouth daily. 90 tablet 1  . carvedilol (COREG) 25 MG tablet Take 1 tablet (25 mg total) by mouth 2 (two) times daily with a meal. 60 tablet 3  . clopidogrel (PLAVIX) 75 MG tablet Take 75 mg by mouth daily.     . divalproex (DEPAKOTE ER) 500 MG 24 hr tablet Take 1 tablet (500 mg total) by mouth daily. 30 tablet 3  . Docusate Calcium (STOOL SOFTENER PO) Take 1 tablet by mouth daily as needed (constipation).     Marland Kitchen leflunomide (ARAVA) 10 MG tablet Take 10 mg by mouth daily.  3  . levothyroxine (SYNTHROID, LEVOTHROID) 100 MCG tablet TAKE 1 TABLET BY MOUTH EVERY DAY 30 tablet 1  . loratadine (CLARITIN) 10 MG tablet Take 10 mg by mouth daily as needed. Reported on 10/23/2015    . losartan (COZAAR) 100 MG tablet TAKE 1 TABLET BY MOUTH EVERY DAY 90 tablet 1  . PROLIA 60 MG/ML SOSY injection Inject 60 mg as directed every 6 (six) months.    . RESTASIS 0.05 % ophthalmic emulsion Place 1 drop into both eyes 2 (two) times daily.  4  . fluticasone (FLONASE) 50 MCG/ACT nasal spray Place 2 sprays into both nostrils daily. (Patient taking differently: Place 2 sprays into both nostrils daily as needed for allergies. ) 16 g 5   No current facility-administered medications on file prior to visit.     BP 130/66 (BP Location: Right Arm, Patient Position: Sitting, Cuff Size: Small)   Pulse 67   Temp 98.4 F (36.9 C) (Oral)   Resp 16   Ht 5\' 5"  (1.651 m)   Wt 116 lb (52.6 kg)   SpO2 97%   BMI 19.30 kg/m       Objective:   Physical Exam  Constitutional: She appears well-developed and well-nourished.  Cardiovascular: Normal rate, regular rhythm and normal heart sounds.  No murmur heard. Pulmonary/Chest: Effort normal. No respiratory distress. She has wheezes (+ expiratory wheeze which cleared with several deep breaths).  Psychiatric: She has a normal mood and affect. Her  behavior is normal. Judgment and thought content normal.          Assessment & Plan:  HTN- BP  stable. Not feeling well if she takes PM dose of coreg. Advised pt as follows:    Tobacco abuse- discussed importance of smoking cessation. Discussed the following recommendations:  Continue coreg 25mg  once daily in the AM only for now.  Check blood pressure daily in the evening for the next few nights and send me your readings via mychart.  Will plan further recommendations following review of home HS bp readings.    Please purchase the nicotine patch 41mcg/hr and apply once daily for the next 1 month, then cut down to 7 mcg/hr daily.

## 2018-08-30 NOTE — Patient Instructions (Signed)
Please purchase the nicotine patch 11mcg/hr and apply once daily for the next 1 month, then cut down to 7 mcg/hr daily.  Continue coreg 25mg  once daily in the AM.  Check blood pressure daily in the evening for the next few nights and send me your readings via mychart.

## 2018-09-01 ENCOUNTER — Other Ambulatory Visit: Payer: Self-pay | Admitting: Family

## 2018-09-27 ENCOUNTER — Ambulatory Visit (INDEPENDENT_AMBULATORY_CARE_PROVIDER_SITE_OTHER): Payer: Medicare Other | Admitting: Family

## 2018-09-27 ENCOUNTER — Encounter: Payer: Self-pay | Admitting: Family

## 2018-09-27 VITALS — BP 140/78 | HR 82 | Temp 97.8°F | Resp 18 | Ht 65.0 in | Wt 114.6 lb

## 2018-09-27 DIAGNOSIS — R198 Other specified symptoms and signs involving the digestive system and abdomen: Secondary | ICD-10-CM | POA: Diagnosis not present

## 2018-09-27 DIAGNOSIS — Z720 Tobacco use: Secondary | ICD-10-CM

## 2018-09-27 DIAGNOSIS — I10 Essential (primary) hypertension: Secondary | ICD-10-CM | POA: Diagnosis not present

## 2018-09-27 NOTE — Patient Instructions (Signed)
Please complete lab work prior to leaving.   

## 2018-09-27 NOTE — Progress Notes (Signed)
Subjective:    [name], female    DOB: 12/06/1940, 77 y.o.   MRN: [id number]  HPI   Patient is a 77 year old female who presents today for follow-up.  Last visit we continued Coreg 25 mg once daily in the morning.  She was advised to check her blood pressure daily in the evening for a few nights.Reports that she is having issues with her machine.  BP Readings from Last 3 Encounters:  09/27/18 140/78  08/30/18 130/66  08/03/18 (!) 172/84   Tobacco abuse- last visit we gave her instructions on nicotine patch.  She reports that she has not purchased the patch and continues to smoke.  Reports she wants to quit.   Reports that she had dental extractions last December.  Reports that she was unable to tolerate the dentures. Reports that she has pus-like drainage from some spots in her mouth. She went to her dentist 1 month ago.  They placed her on amoxicillin. She reports that she has one tablet left.    Review of Systems See HPI  Past Medical History:  Diagnosis Date  . Allergy   . Anemia    iron deficiency  . Cancer ([place]) 1995   vaginal  . Cataract   . Colon polyps   . CVA (cerebral vascular accident) ([place]) 02/2017  . Depression   . Hyperlipidemia   . Hypertension   . Osteoporosis 01/01/2015  . Rheumatoid arteritis ([place])   . Thyroid disease   . TIA (transient ischemic attack) 2016  . Urinary incontinence      Social History   Socioeconomic History  . Marital status: Divorced    Spouse name: Not on file  . Number of children: 8  . Years of education: Not on file  . Highest education level: Not on file  Occupational History    Employer: RETIRED    Comment: Retired from [employer]  . Financial resource strain: Not on file  . Food insecurity:    Worry: Not on file    Inability: Not on file  . Transportation needs:    Medical: Not on file    Non-medical: Not on file  Tobacco Use  . Smoking status: Current Some Day Smoker   Packs/day: 0.25    Years: 32.00    Pack years: 8.00    Types: Cigarettes  . Smokeless tobacco: Never Used  . Tobacco comment: 6 or 7 per day  Substance and Sexual Activity  . Alcohol use: No    Alcohol/week: 0.0 standard drinks  . Drug use: No  . Sexual activity: Never  Lifestyle  . Physical activity:    Days per week: Not on file    Minutes per session: Not on file  . Stress: Not on file  Relationships  . Social connections:    Talks on phone: Not on file    Gets together: Not on file    Attends religious service: Not on file    Active member of club or organization: Not on file    Attends meetings of clubs or organizations: Not on file    Relationship status: Not on file  . Intimate partner violence:    Fear of current or ex partner: Not on file    Emotionally abused: Not on file    Physically abused: Not on file    Forced sexual activity: Not on file  Other Topics Concern  . Not on file  Social History Narrative  .  Not on file    Past Surgical History:  Procedure Laterality Date  . ABDOMINAL HYSTERECTOMY  1976  . CATARACT EXTRACTION Bilateral   . 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 Hypertension Father   . Colon cancer Father   . Ulcerative colitis Mother   . Parkinson's disease Mother   . Heart disease Sister 78       CABG x 3  . Cirrhosis Brother   . Alcohol abuse Brother   . Colitis Daughter   . Colon polyps Daughter   . Heart attack Daughter   . Hypertension Son   . Kidney disease Son        transplant due to kidney problems after [facility]  . Arthritis Other   . Coronary artery disease Other   . Hyperlipidemia Other   . Hypertension Other   . Stroke Sister        died 61  . Arthritis Sister   . Hypertension Sister   . Thyroid disease Daughter        x 3  . Cirrhosis Brother 65       ETOH abuse    Allergies  Allergen Reactions  . Amlodipine Other (See Comments)     dizziness  . Iron Nausea And Vomiting    Can take infusions     Current Outpatient Medications on File Prior to Visit  Medication Sig Dispense Refill  . Adalimumab (HUMIRA PEN [place]) Inject into the skin.     Marland Kitchen amoxicillin-clavulanate (AUGMENTIN) 875-125 MG tablet Take 1 tablet by mouth 2 (two) times daily. 20 tablet 0  . atorvastatin (LIPITOR) 80 MG tablet Take 1 tablet (80 mg total) by mouth daily. 90 tablet 1  . carvedilol (COREG) 25 MG tablet One tablet by mouth once daily in the AM 60 tablet 3  . clopidogrel (PLAVIX) 75 MG tablet Take 75 mg by mouth daily.     . divalproex (DEPAKOTE ER) 500 MG 24 hr tablet Take 1 tablet (500 mg total) by mouth daily. 30 tablet 3  . Docusate Calcium (STOOL SOFTENER PO) Take 1 tablet by mouth daily as needed (constipation).     Marland Kitchen leflunomide (ARAVA) 10 MG tablet Take 10 mg by mouth daily.  3  . levothyroxine (SYNTHROID, LEVOTHROID) 100 MCG tablet TAKE 1 TABLET BY MOUTH EVERY DAY 30 tablet 1  . loratadine (CLARITIN) 10 MG tablet Take 10 mg by mouth daily as needed. Reported on 10/23/2015    . losartan (COZAAR) 100 MG tablet TAKE 1 TABLET BY MOUTH EVERY DAY 90 tablet 1  . PROLIA 60 MG/ML SOSY injection Inject 60 mg as directed every 6 (six) months.    . RESTASIS 0.05 % ophthalmic emulsion Place 1 drop into both eyes 2 (two) times daily.  4  . fluticasone (FLONASE) 50 MCG/ACT nasal spray Place 2 sprays into both nostrils daily. (Patient taking differently: Place 2 sprays into both nostrils daily as needed for allergies. ) 16 g 5  . predniSONE (DELTASONE) 10 MG tablet Take 2 tablets by mouth 2 (two) times daily.  3   No current facility-administered medications on file prior to visit.     BP 140/78 (BP Location: Right Arm, Patient Position: Sitting, Cuff Size: Normal)   Pulse 82   Temp 97.8 F (36.6 C) (Oral)   Resp 18   Ht 5\' 5"  (1.651 m)   Wt 114 lb 9.6  oz (52 kg)   SpO2 100%   BMI 19.07 kg/m       Objective:   Physical Exam  Constitutional:  She is oriented to person, place, and time. She appears well-developed and well-nourished.  HENT:  Some mild irritation noted on left lower anterior gumline.  Patient has no teeth in this area.  Cardiovascular: Normal rate, regular rhythm and normal heart sounds.  No murmur heard. Pulmonary/Chest: Effort normal and breath sounds normal. No respiratory distress. She has no wheezes.  Neurological: She is alert and oriented to person, place, and time.  Skin: Skin is warm and dry.  Psychiatric: She has a normal mood and affect. Her behavior is normal. Judgment and thought content normal.          Assessment & Plan:  Gum symptoms- I do not see any active sign of infection currently.  She does not have any signs of sepsis fever chills weakness.  We will check a CBC.  I advised her that it is unlikely that she has bacteremia from her gum infection but to let me know if she develops fever.  Hypertension-I attempted to take her reading with her home meter failed to fully inflate.  I have suggested she purchase new meter. several times not to the meter our reading today in the office looks good.  Continue current meds/doses.  Tobacco abuse-encourage patient to purchase the nicotine patches and set a quit date.  Discussed importance of smoking cessation.

## 2018-09-28 ENCOUNTER — Other Ambulatory Visit: Payer: Self-pay | Admitting: Family

## 2018-09-28 LAB — CBC WITH DIFFERENTIAL/PLATELET
Basophils Absolute: 0.1 10*3/uL (ref 0.0–0.1)
Basophils Relative: 1 % (ref 0.0–3.0)
Eosinophils Absolute: 0.1 10*3/uL (ref 0.0–0.7)
Eosinophils Relative: 1.4 % (ref 0.0–5.0)
HCT: 35.9 % — ABNORMAL LOW (ref 36.0–46.0)
Hemoglobin: 11.7 g/dL — ABNORMAL LOW (ref 12.0–15.0)
Lymphocytes Relative: 40.2 % (ref 12.0–46.0)
Lymphs Abs: 3.4 10*3/uL (ref 0.7–4.0)
MCHC: 32.6 g/dL (ref 30.0–36.0)
MCV: 92.8 fl (ref 78.0–100.0)
Monocytes Absolute: 0.6 10*3/uL (ref 0.1–1.0)
Monocytes Relative: 7.4 % (ref 3.0–12.0)
Neutro Abs: 4.2 10*3/uL (ref 1.4–7.7)
Neutrophils Relative %: 50 % (ref 43.0–77.0)
Platelets: 223 10*3/uL (ref 150.0–400.0)
RBC: 3.87 Mil/uL (ref 3.87–5.11)
RDW: 16.1 % — ABNORMAL HIGH (ref 11.5–15.5)
WBC: 8.4 10*3/uL (ref 4.0–10.5)

## 2018-09-30 ENCOUNTER — Ambulatory Visit: Payer: Medicare Other | Admitting: Cardiology

## 2018-10-14 ENCOUNTER — Ambulatory Visit: Payer: Medicare Other | Admitting: Family

## 2018-10-24 ENCOUNTER — Encounter: Payer: Self-pay | Admitting: Neurology

## 2018-10-24 ENCOUNTER — Ambulatory Visit (INDEPENDENT_AMBULATORY_CARE_PROVIDER_SITE_OTHER): Payer: Medicare Other | Admitting: Neurology

## 2018-10-24 VITALS — BP 157/92 | HR 69 | Wt 115.2 lb

## 2018-10-24 DIAGNOSIS — R42 Dizziness and giddiness: Secondary | ICD-10-CM | POA: Diagnosis not present

## 2018-10-24 NOTE — Progress Notes (Signed)
[facility] [address] [place]. [place] [address] [phone]       OFFICE FOLLOW-UP NOTE  [name] Date of Birth:  [birth year] Medical Record Number:  [id number]   HPI: Last office visit 05/15/2015 : 60 year is an elderly lady seen today for first office follow-up visit following admission for TIA on 12/16/14. She presented with the transient speech output difficulties as well as confusion followed by some tremulousness. Symptoms lasted around 30-45 minutes and started resolving by the time she reached the hospital. On arrival she had no focal deficits. MRI scan of the brain showed no acute infarct and changes of small vessel disease. 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 Hemoglobin A1c was 6.6. Transthoracic echo was unremarkable. Carotid ultrasound showed no significant extra-axial stenosis. LDL cholesterol was elevated at 121. The episode was felt to also possibly represent a seizure and EEG was obtained which was normal. She started on aspirin which is tolerating well without bleeding or bruising. She today and found that she had another episode 3 years ago while in church she was walking outside she felt weak all over and she lost her voice and had to sit down for a little while. She was seen at [facility] where blood pressure was found to be significantly elevated. Last year in September she had only episode where she felt disoriented and confused and nauseous but did not pass out. Patient has not been evaluated with cardiac event monitor for arrhythmias yet. She remains on Lipitor she is tolerating well without side effects. She is also on aspirin without bleeding or bruising. Update 04/11/18 : She returns for follow-up after last visit with me nearly 3 years ago.  She states she has had no definite recurrent TIA or strokelike symptoms.  She remains on Plavix which is tolerating well without bruising  or bleeding.  She states her blood pressure is usually well controlled though today it is elevated in office slightly.  She has been increasing salt intake in her food and states her primary care physician asked her to do so.  She did have some lab work last month which showed low sodium of 134.  TSH was suppressed for which her Synthroid has been adjusted.  She complains of decreased appetite and weight loss but plans to discuss this with her primary physician.  She is tolerating Lipitor well without muscle aches and pains but cannot tell me when the last time her lipid profile was checked.  She did have an episode of syncope in January.  She was visiting a restaurant with her daughter and feels that she passed out.  She was incontinent of bowel.  She is previously had work-up for her syncopal events in 2016 and she had an EEG as well as carotid ultrasound which were unremarkable.  She also had a Holter monitor which was unrevealing.  She was seen by Dr. Ellouise Newer neurologist on 12/30/2015 but has not not had any new recurrent neurological symptoms. Update 07/20/2018 : he is referred back for neurological opinion by primary physician as she had one more episode of loss of consciousness about a month ago. She states she was sitting outside the house talking to a neighbor 1 out of that and she passed out and fell forwards. The neighbor tried to arouse her and patient was unresponsive for several minutes. By the time she regained consciousness the paramedics were there.  She felt lightheaded and disoriented for some time. She had urinated". There is no tongue bite or injury. She had no significant headache or focal extremity weakness at that time. The patient has also noticed new left temporal headaches with retro-orbital pain for the last month or so. She has seen her primary care physician and has been referred to ophthalmology at [facility] and has an appointment in a few weeks. 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 She does follow up with Dr. Trudie Reed for that. Patient has had multiple workup for the syncope versus seizure in the past with a 48-hour EEG interpreted 2017 as well as 2 other EEGs in February and July 2016 been normal. MRI scan of the brain except 2016 was also unremarkable. She has not had any recent brain imaging studies are EEG on neurovascular imaging done following the recent episode. Update 10/24/2018 : She returns for follow-up after last visit 3 months ago.  She had MRI scan of the brain done on 07/29/2018 which I personally reviewed shows small vessel disease changes and mild generalized atrophy no acute abnormality or definite strokes are noted.  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  MRA of the neck shows no significant large vessel extracranial stenosis.  Lab work on 07/20/2018 showed hemoglobin A1c of 5.5 and LDL cholesterol of 138 mg percent.  She has since been started on Lipitor and the dose was increased to 80 mg daily which is tolerating well without side effects.  She states her headaches are no longer bothersome but she still on Depakote.  She is been complaining of left eye pain and redness for which he has seen an eye doctor and rheumatologist.  She has been started on a trial of prednisone.  She also complains of dizziness and feeling heaviness in the legs particularly after she has been on her feet for a long time.  She has not had any further episodes of passing out or fainting.  She does not do any orthostatic tolerance exercises. ROS:   14 system review of systems is positive for weight loss,  Fatigue, runny nose, numbness, weakness, joint swelling, frequency of urination, constipation and all other systems negative PMH:  Past Medical History:  Diagnosis Date  . Allergy   . Anemia    iron deficiency  . 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 Cataract   . Colon polyps   . CVA (cerebral vascular  accident) ([place]) 02/2017  . Depression   . Hyperlipidemia   . Hypertension   . Osteoporosis 01/01/2015  . Rheumatoid arteritis ([place])   . Thyroid disease   . TIA (transient ischemic attack) 2016  . Urinary incontinence     Social History:  Social History   Socioeconomic History  . Marital status: Divorced    Spouse name: Not on file  . Number of children: 8  . Years of education: Not on file  . Highest education level: Not on file  Occupational History    Employer: RETIRED    Comment: Retired from [employer]  . Financial resource strain: Not on file  . Food insecurity:    Worry: Not on file    Inability: Not on file  . Transportation needs:    Medical: Not on file    Non-medical: Not on file  Tobacco Use  . Smoking status: Current Some Day Smoker    Packs/day: 0.25  Years: 32.00    Pack years: 8.00    Types: Cigarettes  . Smokeless tobacco: Never Used  . Tobacco comment: 6 or 7 per day  Substance and Sexual Activity  . Alcohol use: No    Alcohol/week: 0.0 standard drinks  . Drug use: No  . Sexual activity: Never  Lifestyle  . Physical activity:    Days per week: Not on file    Minutes per session: Not on file  . Stress: Not on file  Relationships  . Social connections:    Talks on phone: Not on file    Gets together: Not on file    Attends religious service: Not on file    Active member of club or organization: Not on file    Attends meetings of clubs or organizations: Not on file    Relationship status: Not on file  . Intimate partner violence:    Fear of current or ex partner: Not on file    Emotionally abused: Not on file    Physically abused: Not on file    Forced sexual activity: Not on file  Other Topics Concern  . Not on file  Social History Narrative  . Not on file    Medications:   Current Outpatient Medications on File Prior to Visit  Medication Sig Dispense Refill  . Adalimumab (HUMIRA PEN Tiger) Inject into the skin.      Marland Kitchen atorvastatin (LIPITOR) 80 MG tablet Take 1 tablet (80 mg total) by mouth daily. 90 tablet 1  . carvedilol (COREG) 25 MG tablet One tablet by mouth once daily in the AM 60 tablet 3  . clopidogrel (PLAVIX) 75 MG tablet Take 75 mg by mouth daily.     Mariane Baumgarten Calcium (STOOL SOFTENER PO) Take 1 tablet by mouth daily as needed (constipation).     . fluticasone (FLONASE) 50 MCG/ACT nasal spray Place 2 sprays into both nostrils daily. (Patient taking differently: Place 2 sprays into both nostrils daily as needed for allergies. ) 16 g 5  . leflunomide (ARAVA) 10 MG tablet Take 10 mg by mouth daily.  3  . levothyroxine (SYNTHROID, LEVOTHROID) 100 MCG tablet TAKE 1 TABLET BY MOUTH EVERY DAY 30 tablet 1  . losartan (COZAAR) 100 MG tablet TAKE 1 TABLET BY MOUTH EVERY DAY 90 tablet 1  . PROLIA 60 MG/ML SOSY injection Inject 60 mg as directed every 6 (six) months.    . RESTASIS 0.05 % ophthalmic emulsion Place 1 drop into both eyes 2 (two) times daily.  4   No current facility-administered medications on file prior to visit.     Allergies:   Allergies  Allergen Reactions  . Amlodipine Other (See Comments)    dizziness  . Iron Nausea And Vomiting    Can take infusions     Physical Exam General:frail elderly african american lady, seated, in no evident distress Head: head normocephalic and atraumatic.  Neck: supple with no carotid or supraclavicular bruits Cardiovascular: regular rate and rhythm, no murmurs Musculoskeletal:rheumatoid deformityi n right hand fingers Skin:  no rash/petichiae Vascular:  Normal pulses all extremities Vitals:   10/24/18 1439  BP: (!) 157/92  Pulse: 69   Neurologic Exam Mental Status: Awake and fully alert. Oriented to place and time. Recent and remote memory intact. Attention span, concentration and fund of knowledge appropriate. Mood and affect appropriate.  Cranial Nerves: Fundoscopic exam not done. Pupils equal, briskly reactive to light. Extraocular  movements full without nystagmus. Visual fields full to confrontation.  Hearing intact. Facial sensation intact. Face, tongue, palate moves normally and symmetrically.  Motor: Normal bulk and tone. Normal strength in all tested extremity muscles. Sensory.: intact to touch ,pinprick .position sensation.but diminished vibration over toes bilaterally  Coordination: Rapid alternating movements normal in all extremities. Finger-to-nose and heel-to-shin performed accurately bilaterally. Gait and Station: Arises from chair without difficulty. Stance is normal. Gait demonstrates normal stride length and balance . Able to heel, toe and tandem walk with moderate difficulty.  Reflexes: 1+ and symmetric. Toes downgoing.      ASSESSMENT: 77 year old African-American lady with remote history of left hemispheric TIA in February 2016 due to small vessel disease with vascular risk factors of hypertension, hyperlipidemia and age.  History of multiple episodes of syncope likely vasovagal with most recent being in January 2019.  with brief loss of consciousness followed by some confusion unclear as to syncope versus posterior circulation TIA. New complains of orthostatic dizziness   PLAN: I had a long discussion with the patient regarding her new complaints of feeling of dizziness and leg heaviness after standing for a while which likely represents orthostasis and recommend she do orthostatic tolerance exercises.  If the symptoms persist I recommend she see her primary care physician to discuss changing her blood pressure medicine to alternative agent.  I recommend she discontinue Depakote as she is not had headaches for a while and is no longer needed.  Continue Plavix for stroke prevention with strict control of hypertension blood pressure goal below 140/90, lipids with LDL cholesterol goal below 70 mg percent.  She will return for follow-up in 6 months with my nurse practitioner Janett Billow or call earlier if  necessary. Greater than 50% of time during this 25 minute visit was spent on counseling,explanation of diagnosis,of syncope and TIA planning of further management, discussion with patient and family and coordination of care Antony Contras. MD Note: This document was prepared with digital dictation and possible smart phrase technology. Any transcriptional errors that result from this process are unintentional

## 2018-10-24 NOTE — Patient Instructions (Signed)
I had a long discussion with the patient regarding her new complaints of feeling of dizziness and leg heaviness after standing for a while which likely represents orthostasis and recommend she do orthostatic tolerance exercises.  If the symptoms persist I recommend she see her primary care physician to discuss changing her blood pressure medicine to alternative agent.  I recommend she discontinue Depakote as she is not had headaches for a while and is no longer needed.  Continue Plavix for stroke prevention with strict control of hypertension blood pressure goal below 140/90, lipids with LDL cholesterol goal below 70 mg percent.  She will return for follow-up in 6 months with my nurse practitioner Janett Billow or call earlier if necessary.  Orthostatic Hypotension Orthostatic hypotension is a sudden drop in blood pressure that happens when you quickly change positions, such as when you get up from a seated or lying position. Blood pressure is a measurement of how strongly, or weakly, your blood is pressing against the walls of your arteries. Arteries are blood vessels that carry blood from your heart throughout your body. When blood pressure is too low, you may not get enough blood to your brain or to the rest of your organs. This can cause weakness, light-headedness, rapid heartbeat, and fainting. This can last for just a few seconds or for up to a few minutes. Orthostatic hypotension is usually not a serious problem. However, if it happens frequently or gets worse, it may be a sign of something more serious. What are the causes? This condition may be caused by:  Sudden changes in posture, such as standing up quickly after you have been sitting or lying down.  Blood loss.  Loss of body fluids (dehydration).  Heart problems.  Hormone (endocrine) problems.  Pregnancy.  Severe infection.  Lack of certain nutrients.  Severe allergic reactions (anaphylaxis).  Certain medicines, such as blood  pressure medicine or medicines that make the body lose excess fluids (diuretics). Sometimes, this condition can be caused by not taking medicine as directed, such as taking too much of a certain medicine.  What increases the risk? Certain factors can make you more likely to develop orthostatic hypotension, including:  Age. Risk increases as you get older.  Conditions that affect the heart or the central nervous system.  Taking certain medicines, such as blood pressure medicine or diuretics.  Being pregnant.  What are the signs or symptoms? Symptoms of this condition may include:  Weakness.  Light-headedness.  Dizziness.  Blurred vision.  Fatigue.  Rapid heartbeat.  Fainting, in severe cases.  How is this diagnosed? This condition is diagnosed based on:  Your medical history.  Your symptoms.  Your blood pressure measurement. Your health care provider will check your blood pressure when you are: ? Lying down. ? Sitting. ? Standing.  A blood pressure reading is recorded as two numbers, such as "120 over 80" (or 120/80). The first ("top") number is called the systolic pressure. It is a measure of the pressure in your arteries as your heart beats. The second ("bottom") number is called the diastolic pressure. It is a measure of the pressure in your arteries when your heart relaxes between beats. Blood pressure is measured in a unit called mm Hg. Healthy blood pressure for adults is 120/80. If your blood pressure is below 90/60, you may be diagnosed with hypotension. Other information or tests that may be used to diagnose orthostatic hypotension include:  Your other vital signs, such as your heart rate and temperature.  Blood tests.  Tilt table test. For this test, you will be safely secured to a table that moves you from a lying position to an upright position. Your heart rhythm and blood pressure will be monitored during the test.  How is this treated? Treatment for  this condition may include:  Changing your diet. This may involve eating more salt (sodium) or drinking more water.  Taking medicines to raise your blood pressure.  Changing the dosage of certain medicines you are taking that might be lowering your blood pressure.  Wearing compression stockings. These stockings help to prevent blood clots and reduce swelling in your legs.  In some cases, you may need to go to the hospital for:  Fluid replacement. This means you will receive fluids through an IV tube.  Blood replacement. This means you will receive donated blood through an IV tube (transfusion).  Treating an infection or heart problems, if this applies.  Monitoring. You may need to be monitored while medicines that you are taking wear off.  Follow these instructions at home: Eating and drinking   Drink enough fluid to keep your urine clear or pale yellow.  Eat a healthy diet and follow instructions from your health care provider about eating or drinking restrictions. A healthy diet includes: ? Fresh fruits and vegetables. ? Whole grains. ? Lean meats. ? Low-fat dairy products.  Eat extra salt only as directed. Do not add extra salt to your diet unless your health care provider told you to do that.  Eat frequent, small meals.  Avoid standing up suddenly after eating. Medicines  Take over-the-counter and prescription medicines only as told by your health care provider. ? Follow instructions from your health care provider about changing the dosage of your current medicines, if this applies. ? Do not stop or adjust any of your medicines on your own. General instructions  Wear compression stockings as told by your health care provider.  Get up slowly from lying down or sitting positions. This gives your blood pressure a chance to adjust.  Avoid hot showers and excessive heat as directed by your health care provider.  Return to your normal activities as told by your health  care provider. Ask your health care provider what activities are safe for you.  Do not use any products that contain nicotine or tobacco, such as cigarettes and e-cigarettes. If you need help quitting, ask your health care provider.  Keep all follow-up visits as told by your health care provider. This is important. Contact a health care provider if:  You vomit.  You have diarrhea.  You have a fever for more than 2-3 days.  You feel more thirsty than usual.  You feel weak and tired. Get help right away if:  You have chest pain.  You have a fast or irregular heartbeat.  You develop numbness in any part of your body.  You cannot move your arms or your legs.  You have trouble speaking.  You become sweaty or feel lightheaded.  You faint.  You feel short of breath.  You have trouble staying awake.  You feel confused. This information is not intended to replace advice given to you by your health care provider. Make sure you discuss any questions you have with your health care provider. Document Released: 10/16/2002 Document Revised: 07/14/2016 Document Reviewed: 04/17/2016 Elsevier Interactive Patient Education  2018 [employer].

## 2018-10-28 ENCOUNTER — Other Ambulatory Visit: Payer: Self-pay | Admitting: Family

## 2018-11-14 ENCOUNTER — Encounter: Payer: Self-pay | Admitting: Family Medicine

## 2018-11-14 ENCOUNTER — Ambulatory Visit (INDEPENDENT_AMBULATORY_CARE_PROVIDER_SITE_OTHER): Payer: Medicare Other | Admitting: Family Medicine

## 2018-11-14 VITALS — BP 104/60 | HR 94 | Temp 98.3°F | Ht 66.0 in | Wt 108.0 lb

## 2018-11-14 DIAGNOSIS — A084 Viral intestinal infection, unspecified: Secondary | ICD-10-CM

## 2018-11-14 MED ORDER — ONDANSETRON HCL 4 MG PO TABS: 4.0000 mg | ORAL_TABLET | Freq: Three times a day (TID) | ORAL | 0 refills | Status: DC | PRN

## 2018-11-14 MED ORDER — DICYCLOMINE HCL 10 MG PO CAPS: 10.0000 mg | ORAL_CAPSULE | Freq: Three times a day (TID) | ORAL | 0 refills | Status: DC

## 2018-11-14 NOTE — Progress Notes (Signed)
Pre visit review using our clinic review tool, if applicable. No additional management support is needed unless otherwise documented below in the visit note. 

## 2018-11-14 NOTE — Progress Notes (Addendum)
Chief Complaint  Patient presents with  . Fatigue    5 days  . Dizziness  . Diarrhea  . Emesis     Subjective Nancy [name] is a 78 y.o. female who presents with vomiting and diarrhea.  Here with 2 of her daughters. Symptoms began 5 days ago. Patient has abdominal pain, vomiting, diarrhea, chills and decreased oral intake Patient denies fever and UTI s/s's Evaluation to date: no Sick contacts: none known  Past Medical History:  Diagnosis Date  . Allergy   . Anemia    iron deficiency  . 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 Cataract   . Colon polyps   . CVA (cerebral vascular accident) ([place]) 02/2017  . Depression   . Hyperlipidemia   . Hypertension   . Osteoporosis 01/01/2015  . Rheumatoid arteritis ([place])   . Thyroid disease   . TIA (transient ischemic attack) 2016  . Urinary incontinence       Review of Systems Gastrointestinal:  As noted in the HPI  Exam BP 104/60 (BP Location: Left Arm, Patient Position: Sitting, Cuff Size: Normal)   Pulse 94   Temp 98.3 F (36.8 C) (Oral)   Ht 5\' 6"  (1.676 m)   Wt 108 lb (49 kg)   SpO2 96%   BMI 17.43 kg/m  General:  well developed, well hydrated, in no apparent distress Skin:  warm, no pallor or diaphoresis, no rashes Throat/Pharynx:  lips and gingiva without lesion; tongue and uvula midline; non-inflamed pharynx; no exudates or postnasal drainage Neck: neck supple without adenopathy, thyromegaly, or masses Lungs:  clear to auscultation, breath sounds equal bilaterally, no respiratory distress, no wheezes Cardio:  regular rhythm, tachycardia, without murmurs Abdomen:  abdomen soft, diffusely tender to palpation; bowel sounds normal; no masses or organomegaly Extremities:  no clubbing, cyanosis, or edema Psych: limited judgment and insight  Assessment and Plan  Viral gastroenteritis - Plan: Ova and parasite examination, Stool Culture, CANCELED: Stool Culture, CANCELED: Ova and parasite examination  Orders as above. I  think she is having s/s's of dehydration. Check stool culture.  Supportive care, needs to try to keep things down.  I would have a very low threshold to have her go to the ER if the medicine does not help her keep down fluids. Will hold BP meds until she starts keeping PO intake down.  She brought up the leg heaviness that is been going on for over a month.  Recommended she follow-up with her regular PCP if things continue after this resolves. The patient and her daughters voiced understanding and agreement to the plan.  Jasmine [place]ates, DO 11/14/18  3:19 PM

## 2018-11-14 NOTE — Patient Instructions (Addendum)
Push lots of fluids.   If the medicine does not improve ability to eat/drink, I would have a low threshold to go to ER or urgent care for fluids.  If needed, we will be in touch regarding the stool results.  If things don't improve with your leg heaviness, follow up with Melissa.   Let us know if you need anything.

## 2018-11-22 ENCOUNTER — Telehealth: Payer: Self-pay

## 2018-11-22 NOTE — Telephone Encounter (Signed)
[employer]--benefits requested.

## 2018-11-24 NOTE — Telephone Encounter (Signed)
Summary of benefits received. Forwarded to [employer].

## 2018-11-30 NOTE — Telephone Encounter (Signed)
Left detailed message to have patient to call back to schedule prolia injection. Patient is expected to have 0 dollar co-pay. [place] for [employer] to schedule patient on nurse visit.

## 2018-12-01 ENCOUNTER — Ambulatory Visit (INDEPENDENT_AMBULATORY_CARE_PROVIDER_SITE_OTHER): Payer: Medicare Other

## 2018-12-01 DIAGNOSIS — M810 Age-related osteoporosis without current pathological fracture: Secondary | ICD-10-CM | POA: Diagnosis not present

## 2018-12-01 MED ORDER — DENOSUMAB 60 MG/ML ~~LOC~~ SOSY
60.0000 mg | PREFILLED_SYRINGE | Freq: Once | SUBCUTANEOUS | Status: AC
  Administered 2018-12-01: 60 mg via SUBCUTANEOUS

## 2018-12-01 NOTE — Progress Notes (Signed)
Patient here for prolia injection, injection advinistered w/o any incidents.  Patient asked for a BP check and her BP at this time is 207/77. She denies any symptoms and she reports she has not take her bp medication today. Per Dr. Lorelei Pont she can be seen today for this. Patient doen not want to be seen. Patient advised per Dr. Lorelei Pont to go home and take her medication asap.  Patient was also advised to check her blood pressure later today and to call for appointment if still elevated or if she develops any symptoms.

## 2018-12-05 ENCOUNTER — Other Ambulatory Visit: Payer: Self-pay | Admitting: Family

## 2018-12-29 ENCOUNTER — Ambulatory Visit (INDEPENDENT_AMBULATORY_CARE_PROVIDER_SITE_OTHER): Payer: Medicare Other | Admitting: Family

## 2018-12-29 ENCOUNTER — Encounter: Payer: Self-pay | Admitting: Family

## 2018-12-29 VITALS — BP 178/80 | HR 69 | Temp 98.7°F | Resp 16 | Ht 66.0 in | Wt 112.0 lb

## 2018-12-29 DIAGNOSIS — E559 Vitamin D deficiency, unspecified: Secondary | ICD-10-CM | POA: Diagnosis not present

## 2018-12-29 DIAGNOSIS — D509 Iron deficiency anemia, unspecified: Secondary | ICD-10-CM

## 2018-12-29 DIAGNOSIS — E039 Hypothyroidism, unspecified: Secondary | ICD-10-CM | POA: Diagnosis not present

## 2018-12-29 DIAGNOSIS — M810 Age-related osteoporosis without current pathological fracture: Secondary | ICD-10-CM

## 2018-12-29 DIAGNOSIS — E785 Hyperlipidemia, unspecified: Secondary | ICD-10-CM

## 2018-12-29 DIAGNOSIS — M059 Rheumatoid arthritis with rheumatoid factor, unspecified: Secondary | ICD-10-CM

## 2018-12-29 DIAGNOSIS — Z8673 Personal history of transient ischemic attack (TIA), and cerebral infarction without residual deficits: Secondary | ICD-10-CM

## 2018-12-29 DIAGNOSIS — I10 Essential (primary) hypertension: Secondary | ICD-10-CM

## 2018-12-29 LAB — CBC WITH DIFFERENTIAL/PLATELET
Basophils Absolute: 0 10*3/uL (ref 0.0–0.1)
Basophils Relative: 0.3 % (ref 0.0–3.0)
Eosinophils Absolute: 0.1 10*3/uL (ref 0.0–0.7)
Eosinophils Relative: 1.2 % (ref 0.0–5.0)
HCT: 35.9 % — ABNORMAL LOW (ref 36.0–46.0)
Hemoglobin: 11.6 g/dL — ABNORMAL LOW (ref 12.0–15.0)
Lymphocytes Relative: 38.6 % (ref 12.0–46.0)
Lymphs Abs: 3.7 10*3/uL (ref 0.7–4.0)
MCHC: 32.4 g/dL (ref 30.0–36.0)
MCV: 92.4 fl (ref 78.0–100.0)
Monocytes Absolute: 0.8 10*3/uL (ref 0.1–1.0)
Monocytes Relative: 8.1 % (ref 3.0–12.0)
Neutro Abs: 5 10*3/uL (ref 1.4–7.7)
Neutrophils Relative %: 51.8 % (ref 43.0–77.0)
Platelets: 222 10*3/uL (ref 150.0–400.0)
RBC: 3.89 Mil/uL (ref 3.87–5.11)
RDW: 18.3 % — ABNORMAL HIGH (ref 11.5–15.5)
WBC: 9.6 10*3/uL (ref 4.0–10.5)

## 2018-12-29 LAB — LIPID PANEL
Cholesterol: 236 mg/dL — ABNORMAL HIGH (ref 0–200)
HDL: 69.5 mg/dL (ref >39.00)
LDL Cholesterol: 149 mg/dL — ABNORMAL HIGH (ref 0–99)
NonHDL: 166.83
Total CHOL/HDL Ratio: 3
Triglycerides: 89 mg/dL (ref 0.0–149.0)
VLDL: 17.8 mg/dL (ref 0.0–40.0)

## 2018-12-29 LAB — TSH: TSH: 0.49 u[IU]/mL (ref 0.35–4.50)

## 2018-12-29 LAB — IRON: Iron: 93 ug/dL (ref 42–145)

## 2018-12-29 MED ORDER — PREDNISONE 20 MG PO TABS: 20.0000 mg | ORAL_TABLET | Freq: Every day | ORAL | Status: DC

## 2018-12-29 MED ORDER — CALCIUM CARBONATE-VITAMIN D 600-400 MG-UNIT PO TABS: 1.0000 | ORAL_TABLET | Freq: Two times a day (BID) | ORAL | Status: DC

## 2018-12-29 NOTE — Patient Instructions (Signed)
Please increase coreg back to twice daily.

## 2018-12-29 NOTE — Progress Notes (Signed)
Subjective:    Patient ID: [id number] PAK, female    DOB: 1940/12/15, 78 y.o.   MRN: [id number]  HPI  Patient is a 78 yr old female who presents today for follow up.  HTN- maintained on coreg, losartan.  Back in October she noted that she had dizziness in the evenings when she was taking the Coreg 25 mg twice daily.  She had started to leave out the evening dose and was feeling better.  Blood pressure was stable at that time.  She is continued the once daily dosing of Coreg since that time.  She is also maintained on losartan.  She reports good compliance with her blood pressure medication. 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  She was started on cellcept by rheumatology for her eye.  She is on prednisone   Hypothyroid-she is maintained on Synthroid. Lab Results  Component Value Date   TSH 1.76 02/24/2018   Osteoporosis-maintained on prolia. Last dose 12/01/18.  She is not taking calcium supplement.  Hx of cva- maintained on plavix.    Hyperlipidemia- reports that she has not been taking statin. Has been off for at least 2 months.  Lab Results  Component Value Date   CHOL 150 07/20/2018   HDL 44 07/20/2018   LDLCALC 85 07/20/2018   TRIG 105 07/20/2018   CHOLHDL 3.4 07/20/2018   Lab Results  Component Value Date   WBC 8.4 09/27/2018   HGB 11.7 (L) 09/27/2018   HCT 35.9 (L) 09/27/2018   MCV 92.8 09/27/2018   PLT 223.0 09/27/2018     Review of Systems See HPI  Past Medical History:  Diagnosis Date  . Allergy   . Anemia    iron deficiency  . 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 Cataract   . Colon polyps   . CVA (cerebral vascular accident) ([place]) 02/2017  . Depression   . Hyperlipidemia   . Hypertension   . Osteoporosis 01/01/2015  . Rheumatoid arteritis ([place])   . Thyroid disease   . TIA (transient ischemic  attack) 2016  . Urinary incontinence      Social History   Socioeconomic History  . Marital status: Divorced    Spouse name: Not on file  . Number of children: 8  . Years of education: Not on file  . Highest education level: Not on file  Occupational History    Employer: RETIRED    Comment: Retired from [employer]  . Financial resource strain: Not on file  . Food insecurity:    Worry: Not on file    Inability: Not on file  . Transportation needs:    Medical: Not on file    Non-medical: Not on file  Tobacco Use  . Smoking status: Current Some Day Smoker    Packs/day: 0.25    Years: 32.00    Pack years: 8.00    Types: Cigarettes  . Smokeless tobacco: Never Used  . Tobacco comment: 6 or 7 per day  Substance and Sexual Activity  . Alcohol use: No    Alcohol/week: 0.0 standard drinks  . Drug use: No  . Sexual activity: Never  Lifestyle  . Physical activity:    Days per week: Not on file    Minutes per session: Not on file  . Stress: Not  on file  Relationships  . Social connections:    Talks on phone: Not on file    Gets together: Not on file    Attends religious service: Not on file    Active member of club or organization: Not on file    Attends meetings of clubs or organizations: Not on file    Relationship status: Not on file  . Intimate partner violence:    Fear of current or ex partner: Not on file    Emotionally abused: Not on file    Physically abused: Not on file    Forced sexual activity: Not on file  Other Topics Concern  . Not on file  Social History Narrative  . Not on file    Past Surgical History:  Procedure Laterality Date  . ABDOMINAL HYSTERECTOMY  1976  . CATARACT EXTRACTION Bilateral   . 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 Hypertension Father   . Colon cancer Father   . Ulcerative colitis Mother   . Parkinson's disease Mother   . Heart disease Sister  42       CABG x 3  . Cirrhosis Brother   . Alcohol abuse Brother   . Colitis Daughter   . Colon polyps Daughter   . Heart attack Daughter   . Hypertension Son   . Kidney disease Son        transplant due to kidney problems after [facility]  . Arthritis Other   . Coronary artery disease Other   . Hyperlipidemia Other   . Hypertension Other   . Stroke Sister        died 64  . Arthritis Sister   . Hypertension Sister   . Thyroid disease Daughter        x 3  . Cirrhosis Brother 65       ETOH abuse    Allergies  Allergen Reactions  . Amlodipine Other (See Comments)    dizziness  . Iron Nausea And Vomiting    Can take infusions     Current Outpatient Medications on File Prior to Visit  Medication Sig Dispense Refill  . Adalimumab (HUMIRA PEN Fontanet) Inject into the skin.     Marland Kitchen atorvastatin (LIPITOR) 80 MG tablet Take 1 tablet (80 mg total) by mouth daily. 90 tablet 1  . carvedilol (COREG) 25 MG tablet One tablet by mouth once daily in the AM 60 tablet 3  . clopidogrel (PLAVIX) 75 MG tablet TAKE 1 TABLET BY MOUTH EVERY DAY 90 tablet 1  . dicyclomine (BENTYL) 10 MG capsule Take 1 capsule (10 mg total) by mouth 4 (four) times daily -  before meals and at bedtime. 30 capsule 0  . Docusate Calcium (STOOL SOFTENER PO) Take 1 tablet by mouth daily as needed (constipation).     Marland Kitchen leflunomide (ARAVA) 10 MG tablet Take 10 mg by mouth daily.  3  . levothyroxine (SYNTHROID, LEVOTHROID) 100 MCG tablet TAKE 1 TABLET BY MOUTH EVERY DAY 30 tablet 1  . losartan (COZAAR) 100 MG tablet TAKE 1 TABLET BY MOUTH EVERY DAY 90 tablet 1  . mycophenolate (CELLCEPT) 500 MG tablet Take 500 mg by mouth daily.    . ondansetron (ZOFRAN) 4 MG tablet Take 1 tablet (4 mg total) by mouth every 8 (eight) hours as needed. 20 tablet 0  . PROLIA 60 MG/ML SOSY injection Inject 60 mg as directed every  6 (six) months.    . RESTASIS 0.05 % ophthalmic emulsion Place 1 drop into both eyes 2 (two) times daily.  4  .  fluticasone (FLONASE) 50 MCG/ACT nasal spray Place 2 sprays into both nostrils daily. (Patient taking differently: Place 2 sprays into both nostrils daily as needed for allergies. ) 16 g 5   No current facility-administered medications on file prior to visit.     BP (!) 217/62 (BP Location: Right Arm, Patient Position: Sitting, Cuff Size: Small)   Pulse 69   Temp 98.7 F (37.1 C) (Oral)   Resp 16   Ht 5\' 6"  (1.676 m)   Wt 112 lb (50.8 kg)   SpO2 100%   BMI 18.08 kg/m       Objective:   Physical Exam Constitutional:      Appearance: She is well-developed.  Neck:     Musculoskeletal: Neck supple.     Thyroid: No thyromegaly.  Cardiovascular:     Rate and Rhythm: Normal rate and regular rhythm.     Heart sounds: Normal heart sounds. No murmur.  Pulmonary:     Effort: Pulmonary effort is normal. No respiratory distress.     Breath sounds: Normal breath sounds. No wheezing.  Skin:    General: Skin is warm and dry.  Neurological:     Mental Status: She is alert and oriented to person, place, and time.  Psychiatric:        Behavior: Behavior normal.        Thought Content: Thought content normal.        Judgment: Judgment normal.           Assessment & Plan:  Hypertension-uncontrolled.  Patient was given clonidine 0.1 mg p.o. in the office today. Initial bp 217/62. Follow up 178/80 following dose of clonidine and waiting 30 minutes.   We will plan to increase Coreg back up to 25 mg twice daily.  Continue current dose of losartan.  Plan follow-up in 1 week. BP Readings from Last 3 Encounters:  12/29/18 (!) 178/80  11/14/18 104/60  10/24/18 (!) 157/92     Rheumatoid arthritis- fair control.  This is being managed by rheumatology and she is also seeing ophthalmology for her uveitis.  Osteoporosis- she is up-to-date on her Prolia injections.  I have advised her to add Caltrate 600 with D twice daily.  We will also check a follow-up vitamin D level.  She is not taking a  vitamin D supplement.  Hyperlipidemia- not currently taking statin.  Will resume statin.  Obtain follow-up lipid panel.  Hypothyroid-clinically stable on current dose of Synthroid.  Will obtain follow-up TSH.  History of CVA- continue Plavix, restart statin for secondary prevention.  Iron deficiency anemia- obtain follow up cbc and iron levels.

## 2018-12-30 ENCOUNTER — Ambulatory Visit: Payer: Medicare Other | Admitting: Family

## 2019-01-04 LAB — VITAMIN D 1,25 DIHYDROXY
Vitamin D 1, 25 (OH)2 Total: 108 pg/mL — ABNORMAL HIGH (ref 18–72)
Vitamin D2 1, 25 (OH)2: <8 pg/mL
Vitamin D3 1, 25 (OH)2: 108 pg/mL

## 2019-01-05 ENCOUNTER — Ambulatory Visit (INDEPENDENT_AMBULATORY_CARE_PROVIDER_SITE_OTHER): Payer: Medicare Other | Admitting: Family Medicine

## 2019-01-05 VITALS — BP 159/73 | HR 77

## 2019-01-05 DIAGNOSIS — I10 Essential (primary) hypertension: Secondary | ICD-10-CM | POA: Diagnosis not present

## 2019-01-05 MED ORDER — CARVEDILOL 25 MG PO TABS: 25.0000 mg | ORAL_TABLET | Freq: Two times a day (BID) | ORAL | 3 refills | Status: DC

## 2019-01-05 NOTE — Progress Notes (Signed)
Noted. Agree with above.  [place], DO 01/05/19 3:10 PM

## 2019-01-05 NOTE — Progress Notes (Signed)
Patient is here for blood pressure check per Debbrah Alar.  Last BP: 178/80 Pulse:69  At the last visit coreg increased back to twice a day and to continue losartan.  First check BP today:  159/73 Pulse: 77   Second check BP: 137/69 Pulse: 66   Per Dr. Nani Ravens to continue coreg twice a day and losartan and follow up in 2 months with Debbrah Alar.

## 2019-01-08 ENCOUNTER — Telehealth: Payer: Self-pay | Admitting: Family

## 2019-01-08 NOTE — Telephone Encounter (Signed)
Vitamin D level is too high. Is she taking any vitamins with vitamin D?  If so please stop.  Thyroid test and, iron test all look good.

## 2019-01-10 NOTE — Telephone Encounter (Signed)
Patient advised of results and to dc any supplements with vitamin D.

## 2019-01-13 ENCOUNTER — Telehealth: Payer: Self-pay

## 2019-01-13 DIAGNOSIS — I739 Peripheral vascular disease, unspecified: Secondary | ICD-10-CM

## 2019-01-13 NOTE — Telephone Encounter (Signed)
Called patient to let her know we received a call from [facility] about her physical evaluation and Melissa will like to follow up with a [place] of her legs. She verbalized understanding.

## 2019-01-13 NOTE — Telephone Encounter (Signed)
Please let the pt know that based on [facility] nurse's findings I am placing an order for an ultrasound to check the circulation in her legs.

## 2019-01-13 NOTE — Telephone Encounter (Signed)
Received a call from [place], evaluated patient today at a yearly physical exam and was calling to report abnormal OAD screening Rt foot 0.23 severe Lf foot 1.11 normal  She also reported patient was asymptomatic.

## 2019-01-13 NOTE — Telephone Encounter (Signed)
Order for test sign

## 2019-01-24 ENCOUNTER — Other Ambulatory Visit: Payer: Self-pay

## 2019-01-24 ENCOUNTER — Telehealth: Payer: Self-pay | Admitting: Family

## 2019-01-24 ENCOUNTER — Ambulatory Visit (HOSPITAL_COMMUNITY)
Admission: RE | Admit: 2019-01-24 | Discharge: 2019-01-24 | Disposition: A | Discharge status: Home / Self Care | Payer: Medicare Other | Source: Ambulatory Visit | Attending: Family | Admitting: Family

## 2019-01-24 DIAGNOSIS — I739 Peripheral vascular disease, unspecified: Secondary | ICD-10-CM | POA: Diagnosis not present

## 2019-01-24 NOTE — Telephone Encounter (Signed)
Please let pt know that her ultrasound shows that she has some decreased blood flow to her legs. I would like to refer her to a vascular doctor for consultation. Referral has been placed.

## 2019-01-24 NOTE — Telephone Encounter (Signed)
Advised patient of results and referral  

## 2019-01-26 NOTE — Telephone Encounter (Signed)
Opened in error

## 2019-03-06 ENCOUNTER — Other Ambulatory Visit: Payer: Self-pay

## 2019-03-06 ENCOUNTER — Ambulatory Visit (INDEPENDENT_AMBULATORY_CARE_PROVIDER_SITE_OTHER): Payer: Medicare Other | Admitting: Family

## 2019-03-06 DIAGNOSIS — M059 Rheumatoid arthritis with rheumatoid factor, unspecified: Secondary | ICD-10-CM | POA: Diagnosis not present

## 2019-03-06 DIAGNOSIS — E039 Hypothyroidism, unspecified: Secondary | ICD-10-CM

## 2019-03-06 DIAGNOSIS — Z8673 Personal history of transient ischemic attack (TIA), and cerebral infarction without residual deficits: Secondary | ICD-10-CM | POA: Diagnosis not present

## 2019-03-06 DIAGNOSIS — I10 Essential (primary) hypertension: Secondary | ICD-10-CM | POA: Diagnosis not present

## 2019-03-06 DIAGNOSIS — M810 Age-related osteoporosis without current pathological fracture: Secondary | ICD-10-CM

## 2019-03-06 MED ORDER — CALCIUM CARBONATE ANTACID 500 MG PO CHEW: 1.0000 | CHEWABLE_TABLET | Freq: Two times a day (BID) | ORAL | Status: DC

## 2019-03-06 MED ORDER — LEVOTHYROXINE SODIUM 100 MCG PO TABS: 100.0000 ug | ORAL_TABLET | Freq: Every day | ORAL | 5 refills | Status: DC

## 2019-03-06 NOTE — Progress Notes (Signed)
Virtual Visit via Video Note  I connected with Nancy [name] on 03/06/19 at 10:20 AM EDT by a video enabled telemedicine application and verified that I am speaking with the correct person using two identifiers. This visit type was conducted due to national recommendations for restrictions regarding the COVID-19 Pandemic (e.g. social distancing).  This format is felt to be most appropriate for this patient at this time.   I discussed the limitations of evaluation and management by telemedicine and the availability of in person appointments. The patient expressed understanding and agreed to proceed.  Only the patient and myself were on today's video visit. The patient was at home and I was at home at the time of today's visit. Although we briefly connected via video, we had to transition to a telephone visit due to technical difficulties.  History of Present Illness:  Patient is a 78 yr old female who presents today for a follow up visit.  HTN- maintained on carvedilol and losartan.  Reports that she had her blood pressure checked on Tuesday 148/71.    BP Readings from Last 3 Encounters:  01/05/19 (!) 159/73  12/29/18 (!) 178/80  11/14/18 104/60   Hypothyroid- reports that she has not taken synthroid in 3 months. "I wasn't really sure I needed it."   Lab Results  Component Value Date   TSH 0.49 12/29/2018   RA- followed by Rheumatology Dr. Gavin Pound and ophthalmology- (Dr. Manuella Ghazi). Her most recent visit was on 02/28/19. Reports that RA has been stable.  Was told that her uveitis is secondary to RA. She is now on cellcept.    Osteoporosis- Maintained on prolia every 6 months.  Reports that she is not taking calcium supplement,   Hx of cva- reports good compliance with plavix. . Observations/Objective:   Gen: Awake, alert, no acute distress Resp: Breathing is even and non-labored Psych: calm/pleasant demeanor Neuro: Alert and Oriented x 3, + facial symmetry, speech is  clear.  Assessment and Plan:   Follow Up Instructions:  Osteoporosis- on prolia. Advised patient to begin tums 500mg  bid.  Last vit D level was high so I want to avoid vitamin D supplementation.   Hx of CVA- clinically stable. Continue plavix and statin for secondary prevention.  RA- following with ophthalmology and rheumatology- reports stable at present.  Hypothyroid- stopped synthroid 3 months ago. Advised pt to restart and that we will recheck TSH at her next in person visit.  HTN- bp stable on current meds. Continue same.     I discussed the assessment and treatment plan with the patient. The patient was provided an opportunity to ask questions and all were answered. The patient agreed with the plan and demonstrated an understanding of the instructions.   The patient was advised to call back or seek an in-person evaluation if the symptoms worsen or if the condition fails to improve as anticipated.    [name], NP

## 2019-03-15 ENCOUNTER — Telehealth: Payer: Self-pay | Admitting: Family

## 2019-03-15 NOTE — Telephone Encounter (Signed)
Patient advised.

## 2019-03-15 NOTE — Telephone Encounter (Signed)
I reviewed her record and it looks like she has not yet scheduled her consult with vascular and vein specialist re: the decreased circulation in her leg. Please provide her with number to call to schedule this appointment:  [phone].

## 2019-03-15 NOTE — Telephone Encounter (Signed)
Per patient she did talk to vascular specialist but they said they will see her sometime in June. I advised patient to call them back and tell them per pcp she needs to be evaluated sooner. Phone number given to patient.

## 2019-03-15 NOTE — Telephone Encounter (Signed)
Since the finding was moderately decreased blood flow and not severe, as long as she is not having new/worsening leg pain I think she can wait until June.

## 2019-03-15 NOTE — Telephone Encounter (Signed)
lvm for patient to call me back about this

## 2019-03-15 NOTE — Telephone Encounter (Signed)
Per patient vascular clinic said we will need to sent an urgent referral in order for them to see patient sooner.

## 2019-04-20 ENCOUNTER — Telehealth: Payer: Self-pay

## 2019-04-20 NOTE — Telephone Encounter (Signed)
I called pt about her visit with Janett Billow NP on June 16 at 245pm. I stated to pt it will be a mychart video visit due to [place] 19. The pt stated she already r/s for JUly with phone room.

## 2019-04-24 ENCOUNTER — Other Ambulatory Visit: Payer: Self-pay

## 2019-04-24 ENCOUNTER — Encounter: Payer: Self-pay | Admitting: Surgery

## 2019-04-24 ENCOUNTER — Ambulatory Visit (INDEPENDENT_AMBULATORY_CARE_PROVIDER_SITE_OTHER): Payer: Medicare Other | Admitting: Surgery

## 2019-04-24 VITALS — BP 205/78 | HR 69 | Temp 97.5°F | Resp 18 | Ht 66.0 in | Wt 116.7 lb

## 2019-04-24 DIAGNOSIS — I70213 Atherosclerosis of native arteries of extremities with intermittent claudication, bilateral legs: Secondary | ICD-10-CM

## 2019-04-24 MED ORDER — CILOSTAZOL 100 MG PO TABS: 100.0000 mg | ORAL_TABLET | Freq: Two times a day (BID) | ORAL | 11 refills | Status: DC

## 2019-04-24 MED ORDER — ROSUVASTATIN CALCIUM 10 MG PO TABS: 10.0000 mg | ORAL_TABLET | Freq: Every day | ORAL | 11 refills | Status: DC

## 2019-04-24 NOTE — Progress Notes (Signed)
[facility] [place]  Patient name: Nancy [name] MRN: [id number] DOB: 23-Apr-1941 Sex: female   REQUESTING PROVIDER:   Debbrah Alar    REASON FOR CONSULT:    Leg pain  HISTORY OF PRESENT ILLNESS:   Nancy [name] is a 78 y.o. female, who is referred for evaluation of leg pain.  The patient finds it difficult to specifically describe her symptoms however she is very clear that her right leg bothers her more than the left.  She states that her symptoms get worse after walking approximately 100 yards.  Her symptoms are in the calf and foot.  She denies rest pain.  She denies any open wounds   The patient is medically managed for hypertension with an ARB.  She also suffers from rheumatoid arthritis which is been stable.  PAST MEDICAL HISTORY    Past Medical History:  Diagnosis Date  . Allergy   . Anemia    iron deficiency  . Cancer ([place]) 1995   vaginal  . Cataract   . Colon polyps   . CVA (cerebral vascular accident) ([place]) 02/2017  . Depression   . Hyperlipidemia   . Hypertension   . Osteoporosis 01/01/2015  . Rheumatoid arteritis ([place])   . Thyroid disease   . TIA (transient ischemic attack) 2016  . Urinary incontinence      FAMILY HISTORY   Family History  Problem Relation Age of Onset  . Hypertension Father   . Colon cancer Father   . Ulcerative colitis Mother   . Parkinson's disease Mother   . Heart disease Sister 42       CABG x 3  . Cirrhosis Brother   . Alcohol abuse Brother   . Colitis Daughter   . Colon polyps Daughter   . Heart attack Daughter   . Hypertension Son   . Kidney disease Son        transplant due to kidney problems after [facility]  . Arthritis Other   . Coronary artery disease Other   . Hyperlipidemia Other   . Hypertension Other   . Stroke Sister        died 68  . Arthritis Sister   . Hypertension Sister   . Thyroid disease Daughter        x 3  . Cirrhosis Brother  27       ETOH abuse    SOCIAL HISTORY:   Social History   Socioeconomic History  . Marital status: Divorced    Spouse name: Not on file  . Number of children: 8  . Years of education: Not on file  . Highest education level: Not on file  Occupational History    Employer: RETIRED    Comment: Retired from [employer]  . Financial resource strain: Not on file  . Food insecurity    Worry: Not on file    Inability: Not on file  . Transportation needs    Medical: Not on file    Non-medical: Not on file  Tobacco Use  . Smoking status: Current Some Day Smoker    Packs/day: 0.25    Years: 32.00    Pack years: 8.00    Types: Cigarettes  . Smokeless tobacco: Never Used  . Tobacco comment: 6 or 7 per day  Substance and Sexual Activity  . Alcohol use: No    Alcohol/week: 0.0 standard drinks  . Drug use: No  . Sexual activity: Never  Lifestyle  .  Physical activity    Days per week: Not on file    Minutes per session: Not on file  . Stress: Not on file  Relationships  . Social Herbalist on phone: Not on file    Gets together: Not on file    Attends religious service: Not on file    Active member of club or organization: Not on file    Attends meetings of clubs or organizations: Not on file    Relationship status: Not on file  . Intimate partner violence    Fear of current or ex partner: Not on file    Emotionally abused: Not on file    Physically abused: Not on file    Forced sexual activity: Not on file  Other Topics Concern  . Not on file  Social History Narrative  . Not on file    ALLERGIES:    Allergies  Allergen Reactions  . Amlodipine Other (See Comments)    dizziness  . Iron Nausea And Vomiting    Can take infusions     CURRENT MEDICATIONS:    Current Outpatient Medications  Medication Sig Dispense Refill  . Adalimumab (HUMIRA PEN Export) Inject into the skin. Once weekly    . atorvastatin (LIPITOR) 80 MG tablet Take 1  tablet (80 mg total) by mouth daily. 90 tablet 1  . carvedilol (COREG) 25 MG tablet Take 1 tablet (25 mg total) by mouth 2 (two) times daily. 60 tablet 3  . clopidogrel (PLAVIX) 75 MG tablet TAKE 1 TABLET BY MOUTH EVERY DAY 90 tablet 1  . Docusate Calcium (STOOL SOFTENER PO) Take 1 tablet by mouth daily as needed (constipation).     Marland Kitchen leflunomide (ARAVA) 10 MG tablet Take 10 mg by mouth daily.  3  . levothyroxine (SYNTHROID) 100 MCG tablet Take 1 tablet (100 mcg total) by mouth daily. 30 tablet 5  . losartan (COZAAR) 100 MG tablet TAKE 1 TABLET BY MOUTH EVERY DAY 90 tablet 1  . mycophenolate (CELLCEPT) 500 MG tablet Take 500 mg by mouth daily.    Marland Kitchen PROLIA 60 MG/ML SOSY injection Inject 60 mg as directed every 6 (six) months.    . calcium carbonate (TUMS) 500 MG chewable tablet Chew 1 tablet (200 mg of elemental calcium total) by mouth 2 (two) times daily. (Patient not taking: Reported on 04/24/2019)    . fluticasone (FLONASE) 50 MCG/ACT nasal spray Place 2 sprays into both nostrils daily. (Patient taking differently: Place 2 sprays into both nostrils daily as needed for allergies. ) 16 g 5   No current facility-administered medications for this visit.     REVIEW OF SYSTEMS:   [X]  denotes positive finding, [ ]  denotes negative finding Cardiac  Comments:  Chest pain or chest pressure:    Shortness of breath upon exertion:    Short of breath when lying flat:    Irregular heart rhythm:        Vascular    Pain in calf, thigh, or hip brought on by ambulation:    Pain in feet at night that wakes you up from your sleep:  x   Blood clot in your veins:    Leg swelling:         Pulmonary    Oxygen at home:    Productive cough:     Wheezing:         Neurologic    Sudden weakness in arms or legs:  x   Sudden numbness in  arms or legs:  x   Sudden onset of difficulty speaking or slurred speech:    Temporary loss of vision in one eye:     Problems with dizziness:         Gastrointestinal     Blood in stool:      Vomited blood:         Genitourinary    Burning when urinating:     Blood in urine:        Psychiatric    Major depression:         Hematologic    Bleeding problems:    Problems with blood clotting too easily:        Skin    Rashes or ulcers:        Constitutional    Fever or chills:     PHYSICAL EXAM:   Vitals:   04/24/19 1057 04/24/19 1101  BP: (!) 202/87 (!) 205/78  Pulse: 69   Resp: 18   Temp: (!) 97.5 F (36.4 C)   SpO2: 98%   Weight: 52.9 kg   Height: 5\' 6"  (1.676 m)     GENERAL: The patient is a well-nourished female, in no acute distress. The vital signs are documented above. CARDIAC: There is a regular rate and rhythm.  VASCULAR: Nonpalpable pedal pulses.  Palpable femoral pulses bilaterally PULMONARY: Nonlabored respirations ABDOMEN: Soft and non-tender with normal pitched bowel sounds.  MUSCULOSKELETAL: There are no major deformities or cyanosis. NEUROLOGIC: No focal weakness or paresthesias are detected. SKIN: There are no ulcers or rashes noted. PSYCHIATRIC: The patient has a normal affect.  STUDIES:   I have reviewed the following studies: +-------+-----------+-----------+------------+------------+ ABI/TBIToday's ABIToday's TBIPrevious ABIPrevious TBI +-------+-----------+-----------+------------+------------+ Right  0.51       0.43                                +-------+-----------+-----------+------------+------------+ Left   1.01       0.68                                +-------+-----------+-----------+------------+------------+    ASSESSMENT and PLAN   Lower extremity atherosclerotic vascular disease: Based on the patient's ABIs, I feel her symptoms in the right leg are predominantly vascular in origin however what she complains about in her foot and some of the numbness is probably related to her rheumatoid arthritis.  I think we can make her symptoms better if we can improve her blood flow.   Before considering invasive procedures, I would like to optimize her medically.  I think that she needs to be on a statin which I have given her a prescription for Crestor 10 mg today.  I am also starting her on cilostazol to see if this helps improve her symptoms.  I will give her 3 months to begin these new medicines and have her follow-up with me at that time.  If she continues to have problems, the neck step will be angiography and intervention on what is likely an occluded right superficial femoral artery.   Leia Alf, MD, FACS [facility] [phone] Pager [phone]

## 2019-04-25 ENCOUNTER — Ambulatory Visit: Payer: Medicare Other | Admitting: Adult Health

## 2019-05-27 MED ORDER — CILOSTAZOL 100 MG PO TABS
100.00 | ORAL_TABLET | ORAL | Status: DC
Start: 2019-05-27 — End: 2019-05-27

## 2019-05-27 MED ORDER — PREDNISONE 5 MG PO TABS
5.00 | ORAL_TABLET | ORAL | Status: DC
Start: 2019-05-28 — End: 2019-05-27

## 2019-05-27 MED ORDER — ONDANSETRON HCL 4 MG/2ML IJ SOLN
4.00 | INTRAMUSCULAR | Status: DC
Start: ? — End: 2019-05-27

## 2019-05-27 MED ORDER — DOCUSATE SODIUM 100 MG PO CAPS
100.00 | ORAL_CAPSULE | ORAL | Status: DC
Start: ? — End: 2019-05-27

## 2019-05-27 MED ORDER — GUAIFENESIN-DM 100-10 MG/5ML PO SYRP
5.00 | ORAL_SOLUTION | ORAL | Status: DC
Start: ? — End: 2019-05-27

## 2019-05-27 MED ORDER — ENOXAPARIN SODIUM 40 MG/0.4ML ~~LOC~~ SOLN
40.00 | SUBCUTANEOUS | Status: DC
Start: 2019-05-28 — End: 2019-05-27

## 2019-05-27 MED ORDER — LEFLUNOMIDE 10 MG PO TABS
10.00 | ORAL_TABLET | ORAL | Status: DC
Start: 2019-05-28 — End: 2019-05-27

## 2019-05-27 MED ORDER — ACETAMINOPHEN 325 MG PO TABS
650.00 | ORAL_TABLET | ORAL | Status: DC
Start: ? — End: 2019-05-27

## 2019-05-27 MED ORDER — CLOPIDOGREL BISULFATE 75 MG PO TABS
75.00 | ORAL_TABLET | ORAL | Status: DC
Start: 2019-05-28 — End: 2019-05-27

## 2019-05-27 MED ORDER — MYCOPHENOLATE MOFETIL 250 MG PO CAPS
500.00 | ORAL_CAPSULE | ORAL | Status: DC
Start: 2019-05-27 — End: 2019-05-27

## 2019-05-27 MED ORDER — MAGNESIUM OXIDE 400 MG PO TABS
400.00 | ORAL_TABLET | ORAL | Status: DC
Start: 2019-05-27 — End: 2019-05-27

## 2019-05-27 MED ORDER — LOSARTAN POTASSIUM 50 MG PO TABS
100.00 | ORAL_TABLET | ORAL | Status: DC
Start: 2019-05-28 — End: 2019-05-27

## 2019-05-27 MED ORDER — ATORVASTATIN CALCIUM 10 MG PO TABS
20.00 | ORAL_TABLET | ORAL | Status: DC
Start: 2019-05-28 — End: 2019-05-27

## 2019-05-27 MED ORDER — BISACODYL 5 MG PO TBEC
10.00 | DELAYED_RELEASE_TABLET | ORAL | Status: DC
Start: ? — End: 2019-05-27

## 2019-05-29 ENCOUNTER — Telehealth: Payer: Self-pay | Admitting: Family

## 2019-05-29 NOTE — Telephone Encounter (Signed)
Done

## 2019-05-29 NOTE — Telephone Encounter (Signed)
Please contact pt to schedule a hospital follow up visit. OK to do in person or virtual.

## 2019-05-30 ENCOUNTER — Other Ambulatory Visit: Payer: Self-pay

## 2019-05-30 ENCOUNTER — Ambulatory Visit (INDEPENDENT_AMBULATORY_CARE_PROVIDER_SITE_OTHER): Payer: Medicare Other | Admitting: Adult Health

## 2019-05-30 ENCOUNTER — Inpatient Hospital Stay: Payer: Medicare Other | Admitting: Family

## 2019-05-30 ENCOUNTER — Encounter: Payer: Self-pay | Admitting: Adult Health

## 2019-05-30 VITALS — BP 169/76 | HR 68 | Temp 97.9°F | Ht 66.0 in | Wt 115.4 lb

## 2019-05-30 DIAGNOSIS — R55 Syncope and collapse: Secondary | ICD-10-CM

## 2019-05-30 DIAGNOSIS — I10 Essential (primary) hypertension: Secondary | ICD-10-CM | POA: Diagnosis not present

## 2019-05-30 NOTE — Progress Notes (Signed)
[facility] [address] [place]. [place] [address] [phone]       OFFICE FOLLOW-UP NOTE  [name] Date of Birth:  [birth year] Medical Record Number:  [id number]   HPI: Last office visit 05/15/2015 : 60 year is an elderly lady seen today for first office follow-up visit following admission for TIA on 12/16/14. She presented with the transient speech output difficulties as well as confusion followed by some tremulousness. Symptoms lasted around 30-45 minutes and started resolving by the time she reached the hospital. On arrival she had no focal deficits. MRI scan of the brain showed no acute infarct and changes of small vessel disease. 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 Hemoglobin A1c was 6.6. Transthoracic echo was unremarkable. Carotid ultrasound showed no significant extra-axial stenosis. LDL cholesterol was elevated at 121. The episode was felt to also possibly represent a seizure and EEG was obtained which was normal. She started on aspirin which is tolerating well without bleeding or bruising. She today and found that she had another episode 3 years ago while in church she was walking outside she felt weak all over and she lost her voice and had to sit down for a little while. She was seen at [facility] where blood pressure was found to be significantly elevated. Last year in September she had only episode where she felt disoriented and confused and nauseous but did not pass out. Patient has not been evaluated with cardiac event monitor for arrhythmias yet. She remains on Lipitor she is tolerating well without side effects. She is also on aspirin without bleeding or bruising. Update 04/11/18 : She returns for follow-up after last visit with me nearly 3 years ago.  She states she has had no definite recurrent TIA or strokelike symptoms.  She remains on Plavix which is tolerating well without bruising  or bleeding.  She states her blood pressure is usually well controlled though today it is elevated in office slightly.  She has been increasing salt intake in her food and states her primary care physician asked her to do so.  She did have some lab work last month which showed low sodium of 134.  TSH was suppressed for which her Synthroid has been adjusted.  She complains of decreased appetite and weight loss but plans to discuss this with her primary physician.  She is tolerating Lipitor well without muscle aches and pains but cannot tell me when the last time her lipid profile was checked.  She did have an episode of syncope in January.  She was visiting a restaurant with her daughter and feels that she passed out.  She was incontinent of bowel.  She is previously had work-up for her syncopal events in 2016 and she had an EEG as well as carotid ultrasound which were unremarkable.  She also had a Holter monitor which was unrevealing.  She was seen by Dr. Ellouise Newer neurologist on 12/30/2015 but has not not had any new recurrent neurological symptoms. Update 07/20/2018 : he is referred back for neurological opinion by primary physician as she had one more episode of loss of consciousness about a month ago. She states she was sitting outside the house talking to a neighbor 1 out of that and she passed out and fell forwards. The neighbor tried to arouse her and patient was unresponsive for several minutes. By the time she regained consciousness the paramedics were there.  She felt lightheaded and disoriented for some time. She had urinated". There is no tongue bite or injury. She had no significant headache or focal extremity weakness at that time. The patient has also noticed new left temporal headaches with retro-orbital pain for the last month or so. She has seen her primary care physician and has been referred to ophthalmology at [facility] and has an appointment in a few weeks. 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 She does follow up with Dr. Trudie Reed for that. Patient has had multiple workup for the syncope versus seizure in the past with a 48-hour EEG interpreted 2017 as well as 2 other EEGs in February and July 2016 been normal. MRI scan of the brain except 2016 was also unremarkable. She has not had any recent brain imaging studies are EEG on neurovascular imaging done following the recent episode. Update 10/24/2018 : She returns for follow-up after last visit 3 months ago.  She had MRI scan of the brain done on 07/29/2018 which I personally reviewed shows small vessel disease changes and mild generalized atrophy no acute abnormality or definite strokes are noted.  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  MRA of the neck shows no significant large vessel extracranial stenosis.  Lab work on 07/20/2018 showed hemoglobin A1c of 5.5 and LDL cholesterol of 138 mg percent.  She has since been started on Lipitor and the dose was increased to 80 mg daily which is tolerating well without side effects.  She states her headaches are no longer bothersome but she still on Depakote.  She is been complaining of left eye pain and redness for which he has seen an eye doctor and rheumatologist.  She has been started on a trial of prednisone.  She also complains of dizziness and feeling heaviness in the legs particularly after she has been on her feet for a long time.  She has not had any further episodes of passing out or fainting.  She does not do any orthostatic tolerance exercises.  05/30/19 VISIT  [name] is a pleasant 78 year old female with longstanding history of syncopal episodes and is being seen today for routine follow-up.  She did have recent syncopal event where she was evaluated at [facility] on 05/26/2019.  Episode consisted of loss of consciousness which was witnessed by daughter.  She denies symptoms of dizziness, lightheadedness  or tunnel vision prior to episode.  It was felt as though likely related to dehydration with decrease p.o. intake related to increase in diarrhea.  Recently started on cilostazol by vascular surgery and had side effects of frequent episodes of diarrhea.  She did attempt to split dosage in half but she continued to have diarrhea.  She continued on this medication for approximately 1 month prior to the syncopal event.  MRI brain unremarkable.  No indication for EEG as she has had extensive work-up for potential seizure activity in the past which was all unremarkable.  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  She denies neck pain or any type of radiculopathy pain.  She has since stopped the cilostazol and has had no additional episodes of diarrhea.  She is now experiencing constipation.  She does experience intermittent balance difficulties but this has been longstanding and denies any worsening  which is likely secondary to rheumatoid arthritis.  Blood pressure is monitored at home with waxing/waning of levels.  Blood pressure today 169/76, asymptomatic.  She continues on clopidogrel and atorvastatin for secondary stroke prevention without reported side effects.  No further concerns at this time.    ROS:   14 system review of systems is positive for fatigue, blurred vision, constipation, incontinence of bladder, joint pain, passing out and all other systems negative   PMH:  Past Medical History:  Diagnosis Date  . Allergy   . Anemia    iron deficiency  . 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 Cataract   . Colon polyps   . CVA (cerebral vascular accident) ([place]) 02/2017  . Depression   . Hyperlipidemia   . Hypertension   . Osteoporosis 01/01/2015  . Rheumatoid arteritis ([place])   . Thyroid disease   . TIA (transient  ischemic attack) 2016  . Urinary incontinence     Social History:  Social History   Socioeconomic History  . Marital status: Divorced    Spouse name: Not on file  . Number of children: 8  . Years of education: Not on file  . Highest education level: Not on file  Occupational History    Employer: RETIRED    Comment: Retired from [employer]  . Financial resource strain: Not on file  . Food insecurity    Worry: Not on file    Inability: Not on file  . Transportation needs    Medical: Not on file    Non-medical: Not on file  Tobacco Use  . Smoking status: Current Some Day Smoker    Packs/day: 0.25    Years: 32.00    Pack years: 8.00    Types: Cigarettes  . Smokeless tobacco: Never Used  . Tobacco comment: 6 or 7 per day  Substance and Sexual Activity  . Alcohol use: No    Alcohol/week: 0.0 standard drinks  . Drug use: No  . Sexual activity: Never  Lifestyle  . Physical activity    Days per week: Not on file    Minutes per session: Not on file  . Stress: Not on file  Relationships  . Social Herbalist on phone: Not on file    Gets together: Not on file    Attends religious service: Not on file    Active member of club or organization: Not on file    Attends meetings of clubs or organizations: Not on file    Relationship status: Not on file  . Intimate partner violence    Fear of current or ex partner: Not on file    Emotionally abused: Not on file    Physically abused: Not on file    Forced sexual activity: Not on file  Other Topics Concern  . Not on file  Social History Narrative  . Not on file    Medications:   Current Outpatient Medications on File Prior to Visit  Medication Sig Dispense Refill  . Adalimumab (HUMIRA PEN [place]) Inject into the skin. Once weekly    . atorvastatin (LIPITOR) 80 MG tablet Take 1 tablet (80 mg total) by mouth daily. 90 tablet 1  . carvedilol (COREG) 25 MG tablet Take 1 tablet (25 mg total) by mouth  2 (two) times daily. 60 tablet 3  . cilostazol (PLETAL) 100 MG tablet Take 1 tablet (100 mg total) by mouth 2 (two) times daily before a meal. 60 tablet  11  . clopidogrel (PLAVIX) 75 MG tablet TAKE 1 TABLET BY MOUTH EVERY DAY 90 tablet 1  . Docusate Calcium (STOOL SOFTENER PO) Take 1 tablet by mouth daily as needed (constipation).     Marland Kitchen leflunomide (ARAVA) 10 MG tablet Take 10 mg by mouth daily.  3  . levothyroxine (SYNTHROID) 100 MCG tablet Take 1 tablet (100 mcg total) by mouth daily. 30 tablet 5  . losartan (COZAAR) 100 MG tablet TAKE 1 TABLET BY MOUTH EVERY DAY 90 tablet 1  . mycophenolate (CELLCEPT) 500 MG tablet Take 500 mg by mouth daily.    . prednisoLONE acetate (PRED FORTE) 1 % ophthalmic suspension Place 1 drop into both eyes once a week.    . predniSONE (DELTASONE) 5 MG tablet Take 5 mg by mouth daily.    Marland Kitchen PROLIA 60 MG/ML SOSY injection Inject 60 mg as directed every 6 (six) months.    . rosuvastatin (CRESTOR) 10 MG tablet Take 1 tablet (10 mg total) by mouth at bedtime. 30 tablet 11  . calcium carbonate (TUMS) 500 MG chewable tablet Chew 1 tablet (200 mg of elemental calcium total) by mouth 2 (two) times daily. (Patient not taking: Reported on 04/24/2019)    . fluticasone (FLONASE) 50 MCG/ACT nasal spray Place 2 sprays into both nostrils daily. (Patient taking differently: Place 2 sprays into both nostrils daily as needed for allergies. ) 16 g 5   No current facility-administered medications on file prior to visit.     Allergies:   Allergies  Allergen Reactions  . Amlodipine Other (See Comments)    dizziness  . Iron Nausea And Vomiting    Can take infusions     Physical Exam General:frail pleasant elderly african american lady, seated, in no evident distress Head: head normocephalic and atraumatic.  Neck: supple with no carotid or supraclavicular bruits Cardiovascular: regular rate and rhythm, no murmurs Musculoskeletal:rheumatoid deformityi n right hand fingers Skin:   no rash/petichiae Vascular:  Normal pulses all extremities  Vitals:   05/30/19 1331  BP: (!) 169/76  Pulse: 68  Temp: 97.9 F (36.6 C)   Neurologic Exam Mental Status: Awake and fully alert. Oriented to place and time. Recent and remote memory intact. Attention span, concentration and fund of knowledge appropriate. Mood and affect appropriate.  Cranial Nerves: Pupils equal, briskly reactive to light. Extraocular movements full without nystagmus. Visual fields full to confrontation. Hearing intact. Facial sensation intact. Face, tongue, palate moves normally and symmetrically.  Motor: Normal bulk and tone. Normal strength in all tested extremity muscles. Sensory.: intact to touch ,pinprick .position sensation.but diminished vibration over toes bilaterally  Coordination: Rapid alternating movements normal in all extremities. Finger-to-nose and heel-to-shin performed accurately bilaterally. Gait and Station: Arises from chair without difficulty. Stance is normal. Gait demonstrates normal stride length and balance . Able to heel, toe and tandem walk with moderate difficulty.  Reflexes: 1+ and symmetric. Toes downgoing.      ASSESSMENT: 78 year old African-American lady with remote history of left hemispheric TIA in February 2016 due to small vessel disease with vascular risk factors of hypertension, hyperlipidemia, arthrosclerosis and age.  History of multiple episodes of syncope likely vasovagal with most recent occurring last week on 05/26/2019  with brief loss of consciousness.  [facility] felt likely due to dehydration with recent decreased p.o. intake and recent increase of diarrhea likely secondary to medication side effect.  She has not experienced any additional syncopal events.   PLAN: -Advised to follow-up with PCP regarding  repeat lab work with findings of Na 135 and K 3.4 while  Inpatient.  Also advised her to follow-up regarding elevated BP and need of HTN  management. -Daughter questioned a possible need of EEG to assess for seizure activity.  Discussion with daughter and patient regarding EEG is not indicated as she has had extensive work-up for possible seizure activity previously and all unremarkable -Recent syncopal event likely secondary to dehydration and advised her to follow-up with vascular surgery regarding increased diarrhea side effects from cilostazol.  Also advised her to ensure adequate fluid intake to avoid dehydration -No indication at this time for neurosurgery consult for cervical findings as recommended at [facility] discharge as she is asymptomatic and not related to recent syncopal event -Continue clopidogrel 75 mg and Crestor for secondary stroke prevention -Advised to monitor BP at home and to follow-up with PCP -Maintain strict control of hypertension blood pressure goal below 140/90, lipids with LDL cholesterol goal below 70 mg percent.   Follow-up as needed  Greater than 50% of time during this 25 minute visit was spent on counseling, explanation of diagnosis of syncope events likely related to dehydration most recently, discussion with patient and family and coordination of care and answering all questions to patient and daughter satisfaction  Venancio Poisson, AGNP-BC  [facility] [address] [place], [place] [address]  Phone [phone] Fax [phone] Note: This document was prepared with digital dictation and possible smart phrase technology. Any transcriptional errors that result from this process are unintentional.

## 2019-05-30 NOTE — Patient Instructions (Signed)
Continue clopidogrel 75 mg daily  and Crestor  for secondary stroke prevention  Follow up with Dr. Trula Slade regarding stopping of cilostazol due to worsening side effects   Continue to follow up with PCP regarding cholesterol and blood pressure management   Follow up with PCP regarding electrolyte levels and elevated blood pressure  Continue to monitor blood pressure at home.   Maintain strict control of hypertension with blood pressure goal below 130/90, diabetes with hemoglobin A1c goal below 6.5% and cholesterol with LDL cholesterol (bad cholesterol) goal below 70 mg/dL. I also advised the patient to eat a healthy diet with plenty of whole grains, cereals, fruits and vegetables, exercise regularly and maintain ideal body weight.        Thank you for coming to see [place] at [facility]. I hope we have been able to provide you high quality care today.  You may receive a patient satisfaction survey over the next few weeks. We would appreciate your feedback and comments so that we may continue to improve ourselves and the health of our patients.

## 2019-05-31 NOTE — Progress Notes (Signed)
I agree with the above plan 

## 2019-06-02 ENCOUNTER — Ambulatory Visit: Payer: Medicare Other

## 2019-06-05 ENCOUNTER — Ambulatory Visit: Payer: Medicare Other | Admitting: Adult Health

## 2019-06-06 ENCOUNTER — Emergency Department (HOSPITAL_BASED_OUTPATIENT_CLINIC_OR_DEPARTMENT_OTHER)
Admission: EM | Admit: 2019-06-06 | Discharge: 2019-06-06 | Disposition: A | Discharge status: Home / Self Care | Payer: Medicare Other | Attending: Emergency Medicine | Admitting: Emergency Medicine

## 2019-06-06 ENCOUNTER — Encounter: Payer: Self-pay | Admitting: Family

## 2019-06-06 ENCOUNTER — Ambulatory Visit (INDEPENDENT_AMBULATORY_CARE_PROVIDER_SITE_OTHER): Payer: Medicare Other | Admitting: Family

## 2019-06-06 ENCOUNTER — Other Ambulatory Visit: Payer: Self-pay

## 2019-06-06 VITALS — BP 122/55 | HR 73 | Temp 98.5°F | Resp 16 | Wt 113.0 lb

## 2019-06-06 DIAGNOSIS — M810 Age-related osteoporosis without current pathological fracture: Secondary | ICD-10-CM

## 2019-06-06 DIAGNOSIS — Z79899 Other long term (current) drug therapy: Secondary | ICD-10-CM | POA: Insufficient documentation

## 2019-06-06 DIAGNOSIS — E119 Type 2 diabetes mellitus without complications: Secondary | ICD-10-CM

## 2019-06-06 DIAGNOSIS — R55 Syncope and collapse: Secondary | ICD-10-CM

## 2019-06-06 DIAGNOSIS — E039 Hypothyroidism, unspecified: Secondary | ICD-10-CM

## 2019-06-06 DIAGNOSIS — E785 Hyperlipidemia, unspecified: Secondary | ICD-10-CM | POA: Diagnosis not present

## 2019-06-06 DIAGNOSIS — I10 Essential (primary) hypertension: Secondary | ICD-10-CM | POA: Diagnosis not present

## 2019-06-06 DIAGNOSIS — F17210 Nicotine dependence, cigarettes, uncomplicated: Secondary | ICD-10-CM | POA: Insufficient documentation

## 2019-06-06 DIAGNOSIS — D509 Iron deficiency anemia, unspecified: Secondary | ICD-10-CM

## 2019-06-06 DIAGNOSIS — I739 Peripheral vascular disease, unspecified: Secondary | ICD-10-CM

## 2019-06-06 LAB — TROPONIN I (HIGH SENSITIVITY)
Troponin I (High Sensitivity): 8 ng/L (ref <18)
Troponin I (High Sensitivity): 8 ng/L (ref <18)

## 2019-06-06 LAB — CBC WITH DIFFERENTIAL/PLATELET
Abs Immature Granulocytes: 0.08 10*3/uL — ABNORMAL HIGH (ref 0.00–0.07)
Basophils Absolute: 0 10*3/uL (ref 0.0–0.1)
Basophils Relative: 0 %
Eosinophils Absolute: 0.3 10*3/uL (ref 0.0–0.5)
Eosinophils Relative: 3 %
HCT: 38.9 % (ref 36.0–46.0)
Hemoglobin: 11.9 g/dL — ABNORMAL LOW (ref 12.0–15.0)
Immature Granulocytes: 1 %
Lymphocytes Relative: 30 %
Lymphs Abs: 2.8 10*3/uL (ref 0.7–4.0)
MCH: 29 pg (ref 26.0–34.0)
MCHC: 30.6 g/dL (ref 30.0–36.0)
MCV: 94.9 fL (ref 80.0–100.0)
Monocytes Absolute: 0.8 10*3/uL (ref 0.1–1.0)
Monocytes Relative: 9 %
Neutro Abs: 5.4 10*3/uL (ref 1.7–7.7)
Neutrophils Relative %: 57 %
Platelets: 256 10*3/uL (ref 150–400)
RBC: 4.1 MIL/uL (ref 3.87–5.11)
RDW: 15.6 % — ABNORMAL HIGH (ref 11.5–15.5)
WBC: 9.3 10*3/uL (ref 4.0–10.5)
nRBC: 0 % (ref 0.0–0.2)

## 2019-06-06 LAB — URINALYSIS, ROUTINE W REFLEX MICROSCOPIC
Bilirubin Urine: NEGATIVE
Glucose, UA: NEGATIVE mg/dL
Hgb urine dipstick: NEGATIVE
Ketones, ur: NEGATIVE mg/dL
Nitrite: NEGATIVE
Protein, ur: NEGATIVE mg/dL
Specific Gravity, Urine: 1.02 (ref 1.005–1.030)
pH: 6.5 (ref 5.0–8.0)

## 2019-06-06 LAB — TSH: TSH: 1.017 u[IU]/mL (ref 0.350–4.500)

## 2019-06-06 LAB — RAPID URINE DRUG SCREEN, HOSP PERFORMED
Amphetamines: NOT DETECTED
Barbiturates: NOT DETECTED
Benzodiazepines: POSITIVE — AB
Cocaine: NOT DETECTED
Opiates: NOT DETECTED
Tetrahydrocannabinol: NOT DETECTED

## 2019-06-06 LAB — URINALYSIS, MICROSCOPIC (REFLEX)

## 2019-06-06 LAB — COMPREHENSIVE METABOLIC PANEL
ALT: 9 U/L (ref 0–44)
AST: 13 U/L — ABNORMAL LOW (ref 15–41)
Albumin: 3.3 g/dL — ABNORMAL LOW (ref 3.5–5.0)
Alkaline Phosphatase: 39 U/L (ref 38–126)
Anion gap: 10 (ref 5–15)
BUN: 20 mg/dL (ref 8–23)
CO2: 24 mmol/L (ref 22–32)
Calcium: 9.1 mg/dL (ref 8.9–10.3)
Chloride: 106 mmol/L (ref 98–111)
Creatinine, Ser: 0.92 mg/dL (ref 0.44–1.00)
GFR calc Af Amer: >60 mL/min (ref >60)
GFR calc non Af Amer: 60 mL/min — ABNORMAL LOW (ref >60)
Glucose, Bld: 105 mg/dL — ABNORMAL HIGH (ref 70–99)
Potassium: 4.1 mmol/L (ref 3.5–5.1)
Sodium: 140 mmol/L (ref 135–145)
Total Bilirubin: 0.6 mg/dL (ref 0.3–1.2)
Total Protein: 6.8 g/dL (ref 6.5–8.1)

## 2019-06-06 LAB — PROTIME-INR
INR: 1.1 (ref 0.8–1.2)
Prothrombin Time: 14.3 seconds (ref 11.4–15.2)

## 2019-06-06 LAB — LIPASE, BLOOD: Lipase: 29 U/L (ref 11–51)

## 2019-06-06 LAB — LACTIC ACID, PLASMA
Lactic Acid, Venous: 1.4 mmol/L (ref 0.5–1.9)
Lactic Acid, Venous: 1.4 mmol/L (ref 0.5–1.9)

## 2019-06-06 LAB — BRAIN NATRIURETIC PEPTIDE: B Natriuretic Peptide: 83.3 pg/mL (ref 0.0–100.0)

## 2019-06-06 MED ORDER — DENOSUMAB 60 MG/ML ~~LOC~~ SOSY
60.0000 mg | PREFILLED_SYRINGE | Freq: Once | SUBCUTANEOUS | Status: AC
  Administered 2019-06-06: 60 mg via SUBCUTANEOUS

## 2019-06-06 NOTE — ED Provider Notes (Signed)
[place] EMERGENCY DEPARTMENT Provider Note   CSN: [id number] Arrival date & time: 06/06/19  1017    History   Chief Complaint Chief Complaint  Patient presents with  . Weakness    HPI Nancy [name] is a 78 y.o. female.     HPI Patient was at her PCP office after follow-up from hospital admission for syncope/near syncope at [facility].  Patient was being transferred by wheelchair to lab for blood draw.  She slumped in her chair and had slightly decreased level of responsiveness.  She did have pulses in the 70s throughout this event per provider caring for her.  Patient reports she did not lose consciousness completely.  She recalls remembering what was happening.  She denies she was experiencing any chest pain palpitations or headache.  She reports she just felt really fatigued and worn out.  This has been a recurrent issue for the patient.  She has had many episodes of syncope or near syncope.  Most recently was this month with an admission with neuro work-up and follow-up scheduled.  Patient has recently lost her son.  He just died yesterday.  She reports she is very sad and anxious regarding this. Past Medical History:  Diagnosis Date  . Allergy   . Anemia    iron deficiency  . 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 Cataract   . Colon polyps   . CVA (cerebral vascular accident) ([place]) 02/2017  . Depression   . Hyperlipidemia   . Hypertension   . Osteoporosis 01/01/2015  . Rheumatoid arteritis ([place])   . Thyroid disease   . TIA (transient ischemic attack) 2016  . Urinary incontinence     Patient Active Problem List   Diagnosis Date Noted  . Tendinitis of right rotator cuff 02/24/2018  . History of CVA (cerebrovascular accident) 03/16/2017  . Hematuria 03/16/2017  . Acute ischemic right middle cerebral artery (MCA) stroke ([place]) 03/03/2017  . Subacromial bursitis of left shoulder joint 08/07/2016  . Drug therapy 01/07/2016  . Generalized anxiety disorder  01/07/2016  . Faintness 12/09/2015  . Loss of weight 10/15/2015  . Anemia, iron deficiency 09/30/2015  . Hyperglycemia 06/07/2015  . Near syncope 05/15/2015  . Osteoporosis 01/01/2015  . Expressive aphasia 12/16/2014  . TIA (transient ischemic attack) 12/16/2014  . Pain in joint, ankle and foot 09/06/2014  . Constipation 09/06/2014  . Rheumatoid arthritis ([place]) 07/25/2014  . Sciatica 01/31/2014  . Hypothyroidism 01/12/2012  . Back pain 01/11/2012  . Edema 06/15/2011  . ANEMIA, B12 DEFICIENCY 11/19/2010  . ANXIETY 10/14/2010  . 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 Hyperlipidemia 12/17/2009  . Iron deficiency anemia 12/17/2009  . TOBACCO ABUSE 12/17/2009  . Depression 12/17/2009  . Essential hypertension 12/17/2009  . ALLERGIC RHINITIS 12/17/2009  . ARTHRITIS, RHEUMATOID 12/17/2009  . OSTEOPOROSIS 12/17/2009  . URINARY INCONTINENCE 12/17/2009    Past Surgical History:  Procedure Laterality Date  . ABDOMINAL HYSTERECTOMY  1976  . CATARACT EXTRACTION Bilateral   . TUMOR REMOVAL  last was 1976   left ankle x3     OB History   No obstetric history on file.      Home Medications    Prior to Admission medications   Medication Sig Start Date End Date Taking? Authorizing Provider  Adalimumab (HUMIRA PEN [place]) Inject into the skin. Once weekly    [provider]  atorvastatin (LIPITOR) 80 MG tablet Take 1 tablet (80 mg total) by mouth daily. Patient not taking: Reported  on 06/06/2019 06/27/18   Debbrah Alar, NP  calcium carbonate (TUMS) 500 MG chewable tablet Chew 1 tablet (200 mg of elemental calcium total) by mouth 2 (two) times daily. Patient not taking: Reported on 04/24/2019 03/06/19   Debbrah Alar, NP  carvedilol (COREG) 25 MG tablet Take 1 tablet (25 mg total) by mouth 2 (two) times daily. Patient not taking: Reported on 06/06/2019 01/05/19   Debbrah Alar, NP  cilostazol (PLETAL) 100 MG tablet Take 100 mg by mouth 2 (two) times daily.    [provider]  clopidogrel (PLAVIX) 75 MG tablet TAKE 1 TABLET BY MOUTH EVERY DAY 10/28/18   Debbrah Alar, NP  Docusate Calcium (STOOL SOFTENER PO) Take 1 tablet by mouth daily as needed (constipation).     [provider]  leflunomide (ARAVA) 10 MG tablet Take 10 mg by mouth daily. 08/15/15   [provider]  levothyroxine (SYNTHROID) 100 MCG tablet Take 1 tablet (100 mcg total) by mouth daily. Patient not taking: Reported on 06/06/2019 03/06/19   Debbrah Alar, NP  loratadine (CLARITIN) 10 MG tablet Take 10 mg by mouth daily.    [provider]  losartan (COZAAR) 100 MG tablet TAKE 1 TABLET BY MOUTH EVERY DAY 12/05/18   Debbrah Alar, NP  mycophenolate (CELLCEPT) 500 MG tablet Take 500 mg by mouth daily. 12/12/18   [provider]  prednisoLONE acetate (PRED FORTE) 1 % ophthalmic suspension Place 1 drop into both eyes once a week.    [provider]  predniSONE (DELTASONE) 5 MG tablet Take 5 mg by mouth daily.    [provider]  PROLIA 60 MG/ML SOSY injection Inject 60 mg as directed every 6 (six) months. 05/11/18   [provider]  rosuvastatin (CRESTOR) 10 MG tablet Take 1 tablet (10 mg total) by mouth at bedtime. 04/24/19 04/23/20  Serafina Mitchell, MD    Family History Family History  Problem Relation Age of Onset  . Hypertension Father   . Colon cancer Father   . Ulcerative colitis Mother   . Parkinson's disease Mother   . Heart disease Sister 9       CABG x 3  . Cirrhosis Brother   . Alcohol abuse Brother   . Colitis Daughter   . Colon polyps Daughter   . Heart attack Daughter   . Hypertension Son   . Kidney disease Son        transplant due to kidney problems after [facility]  . Arthritis Other   . Coronary artery disease Other   . Hyperlipidemia Other   . Hypertension Other   . Stroke Sister        died 55  . Arthritis Sister   . Hypertension Sister   . Thyroid disease Daughter        x 3   . Cirrhosis Brother 8       ETOH abuse    Social History Social History   Tobacco Use  . Smoking status: Current Some Day Smoker    Packs/day: 0.25    Years: 32.00    Pack years: 8.00    Types: Cigarettes  . Smokeless tobacco: Never Used  . Tobacco comment: 6 or 7 per day  Substance Use Topics  . Alcohol use: No    Alcohol/week: 0.0 standard drinks  . Drug use: No     Allergies   Amlodipine and Iron   Review of Systems Review of Systems 10 Systems reviewed and are negative for  acute change except as noted in the HPI.   Physical Exam Updated Vital Signs BP (!) 186/74 (BP Location: Right Arm)   Pulse 61   Temp 97.9 F (36.6 C) (Oral)   Resp 17   Ht 5\' 5"  (1.651 m)   Wt 51.3 kg   SpO2 98%   BMI 18.80 kg/m   Physical Exam Constitutional:      Comments: Patient is alert and nontoxic.  Mental status clear.  No respiratory distress.  HENT:     Head: Normocephalic and atraumatic.     Mouth/Throat:     Mouth: Mucous membranes are moist.     Pharynx: Oropharynx is clear.  Eyes:     Extraocular Movements: Extraocular movements intact.  Neck:     Musculoskeletal: Neck supple.  Cardiovascular:     Rate and Rhythm: Normal rate and regular rhythm.  Pulmonary:     Effort: Pulmonary effort is normal.     Breath sounds: Normal breath sounds.  Abdominal:     General: There is no distension.     Palpations: Abdomen is soft.     Tenderness: There is no abdominal tenderness. There is no guarding.  Musculoskeletal: Normal range of motion.        General: No swelling or tenderness.     Right lower leg: No edema.     Left lower leg: No edema.  Skin:    General: Skin is warm and dry.  Neurological:     General: No focal deficit present.     Mental Status: She is oriented to person, place, and time.     Cranial Nerves: No cranial nerve deficit.     Sensory: No sensory deficit.     Coordination: Coordination normal.  Psychiatric:     Comments: Patient is sad and  discussing what is occurred with her son but she is appropriately interactive.  She is not withdrawn or noncommunicative.      ED Treatments / Results  Labs (all labs ordered are listed, but only abnormal results are displayed) Labs Reviewed  COMPREHENSIVE METABOLIC PANEL - Abnormal; Notable for the following components:      Result Value   Glucose, Bld 105 (*)    Albumin 3.3 (*)    AST 13 (*)    GFR calc non Af Amer 60 (*)    All other components within normal limits  CBC WITH DIFFERENTIAL/PLATELET - Abnormal; Notable for the following components:   Hemoglobin 11.9 (*)    RDW 15.6 (*)    Abs Immature Granulocytes 0.08 (*)    All other components within normal limits  URINALYSIS, ROUTINE W REFLEX MICROSCOPIC - Abnormal; Notable for the following components:   Leukocytes,Ua SMALL (*)    All other components within normal limits  RAPID URINE DRUG SCREEN, HOSP PERFORMED - Abnormal; Notable for the following components:   Benzodiazepines POSITIVE (*)    All other components within normal limits  URINALYSIS, MICROSCOPIC (REFLEX) - Abnormal; Notable for the following components:   Bacteria, UA FEW (*)    All other components within normal limits  LIPASE, BLOOD  BRAIN NATRIURETIC PEPTIDE  LACTIC ACID, PLASMA  LACTIC ACID, PLASMA  PROTIME-INR  TSH  TROPONIN I (HIGH SENSITIVITY)  TROPONIN I (HIGH SENSITIVITY)    EKG EKG Interpretation  Date/Time:  Tuesday June 06 2019 10:34:34 EDT Ventricular Rate:  61 PR Interval:    QRS Duration: 79 QT Interval:  411 QTC Calculation: 414 R Axis:   84 Text Interpretation:  Sinus rhythm Atrial premature complex Borderline right axis deviation Nonspecific T abnormalities, lateral leads no sig change from previuos Confirmed by Charlesetta Shanks [phone]) on 06/06/2019 3:17:55 PM   Radiology No results found.  Procedures Procedures (including critical care time)  Medications Ordered in ED Medications - No data to display   Initial  Impression / Assessment and Plan / ED Course  I have reviewed the triage vital signs and the nursing notes.  Pertinent labs & imaging results that were available during my care of the patient were reviewed by me and considered in my medical decision making (see chart for details).       Patient is referred from PCP office where she had a decreased level responsiveness episode.  Patient reports she recalls the episode.  She denies chest pain or palpitation.  She reports just feeling really fatigued.  I did discuss the case with the practitioner caring for the patient.  She reportedly had palpable pulses in the 70s throughout the event.  This makes dysrhythmia or significant bradycardia much less likely.  Patient's blood pressures have been stable or hypertensive.  No signs of orthostatic hypotension.  Reportedly syncope and near syncope has been a recurrent issue for which the patient has had extensive diagnostic evaluation.  At this time with diagnostic evaluation within normal baseline limits for patient and no objective findings to suggest intercurrent illness, ACS, metabolic derangement or PE, I feel patient is stable for continued outpatient evaluation.  She is here with her daughter who is assisting in caregiving.  Patient herself is cognitively intact and does not wish repeat hospital admission.  Advice is to follow-up with cardiology as well as neurology and return if changing or worsening symptoms develop.  Final Clinical Impressions(s) / ED Diagnoses   Final diagnoses:  Near syncope    ED Discharge Orders    None       Charlesetta Shanks, MD 06/06/19 1520

## 2019-06-06 NOTE — ED Triage Notes (Signed)
Pts daughter states pt was seeing PCP as a followup for prior syncope episode. Pts daughter states pts "head dropped" while having labs drawn-pt was aware but daughter states pt "seemed weak". Pt denies loc, denies pain, pt c/o generalized weakness.

## 2019-06-06 NOTE — ED Notes (Signed)
Patient verbalizes understanding of discharge instructions. Opportunity for questioning and answers were provided. Armband removed by staff, pt discharged from ED.  

## 2019-06-06 NOTE — Progress Notes (Signed)
Subjective:    [name], female    DOB: Oct 07, 1941, 78 y.o.   MRN: [id number]  HPI  Patient is a 78 yr old female who presents today for hospital follow up. Hospital record is reviewed in care everywhere.    Patient was admitted on May 26, 2019 following a syncopal episode.  It was felt that her syncope was secondary to vasovagal reaction versus seizure.  Neurology consulted in the hospital.  They recommended an MRI of the brain and the cervical spine to rule out stroke/spinal stenosis.  The studies were performed.  There were no acute findings noted on MRI of the brain.  Chronic small vessel ischemic changes were noted.  MRI the cervical spine and showed chronic spondylosis, canal stenosis at C3-4 without cord compression, and foraminal stenosis with potential for neural compression from C3-4 through C5-6.  Work-up included a CT angios of her head and neck.  There was no evidence of acute intracranial abnormality.  Note was made of 7 mm and 8 mm right upper lobe pulmonary nodules which were new compared to 2018.  It was recommended that she have a noncontrast chest CT in 3 months.  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  Plavix was added to her regimen.  She reports that she slumped over in the car (was in the passenger seat).  Family took her to the fire department and then paramedics were called.  She had incontinence of stool during this episode.  She has had no further episodes.  She does report feeling weak today.   She reports that vascular put her on pletal and this caused diarrhea.  This was changed to plavix.  She states that she is no longer taking pletal. She saw neuro last week and they did not feel that a follow up EEG was indicated.     Lab Results  Component Value Date   CHOL 236 (H) 12/29/2018   HDL 69.50  12/29/2018   LDLCALC 149 (H) 12/29/2018   TRIG 89.0 12/29/2018   CHOLHDL 3 12/29/2018     Review of Systems   See HPI  Past Medical History:  Diagnosis Date  . Allergy   . Anemia    iron deficiency  . Cancer ([place]) 1995   vaginal  . Cataract   . Colon polyps   . CVA (cerebral vascular accident) ([place]) 02/2017  . Depression   . Hyperlipidemia   . Hypertension   . Osteoporosis 01/01/2015  . Rheumatoid arteritis ([place])   . Thyroid disease   . TIA (transient ischemic attack) 2016  . Urinary incontinence      Social History   Socioeconomic History  . Marital status: Divorced    Spouse name: Not on file  . Number of children: 8  . Years of education: Not on file  . Highest education level: Not on file  Occupational History    Employer: RETIRED    Comment: Retired from [employer]  . Financial resource strain: Not on file  . Food insecurity    Worry: Not on file    Inability: Not on file  . Transportation needs    Medical: Not on file    Non-medical: Not on file  Tobacco Use  . Smoking status: Current Some Day Smoker    Packs/day: 0.25    Years: 32.00  Pack years: 8.00    Types: Cigarettes  . Smokeless tobacco: Never Used  . Tobacco comment: 6 or 7 per day  Substance and Sexual Activity  . Alcohol use: No    Alcohol/week: 0.0 standard drinks  . Drug use: No  . Sexual activity: Never  Lifestyle  . Physical activity    Days per week: Not on file    Minutes per session: Not on file  . Stress: Not on file  Relationships  . Social Herbalist on phone: Not on file    Gets together: Not on file    Attends religious service: Not on file    Active member of club or organization: Not on file    Attends meetings of clubs or organizations: Not on file    Relationship status: Not on file  . Intimate partner violence    Fear of current or ex partner: Not on file    Emotionally abused: Not on file    Physically abused: Not on file     Forced sexual activity: Not on file  Other Topics Concern  . Not on file  Social History Narrative  . Not on file    Past Surgical History:  Procedure Laterality Date  . ABDOMINAL HYSTERECTOMY  1976  . CATARACT EXTRACTION Bilateral   . 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 Hypertension Father   . Colon cancer Father   . Ulcerative colitis Mother   . Parkinson's disease Mother   . Heart disease Sister 72       CABG x 3  . Cirrhosis Brother   . Alcohol abuse Brother   . Colitis Daughter   . Colon polyps Daughter   . Heart attack Daughter   . Hypertension Son   . Kidney disease Son        transplant due to kidney problems after [facility]  . Arthritis Other   . Coronary artery disease Other   . Hyperlipidemia Other   . Hypertension Other   . Stroke Sister        died 5  . Arthritis Sister   . Hypertension Sister   . Thyroid disease Daughter        x 3  . Cirrhosis Brother 65       ETOH abuse    Allergies  Allergen Reactions  . Amlodipine Other (See Comments)    dizziness  . Iron Nausea And Vomiting    Can take infusions     Current Outpatient Medications on File Prior to Visit  Medication Sig Dispense Refill  . Adalimumab (HUMIRA PEN [place]) Inject into the skin. Once weekly    . cilostazol (PLETAL) 100 MG tablet Take 100 mg by mouth 2 (two) times daily.    . clopidogrel (PLAVIX) 75 MG tablet TAKE 1 TABLET BY MOUTH EVERY DAY 90 tablet 1  . leflunomide (ARAVA) 10 MG tablet Take 10 mg by mouth daily.  3  . loratadine (CLARITIN) 10 MG tablet Take 10 mg by mouth daily.    Marland Kitchen losartan (COZAAR) 100 MG tablet TAKE 1 TABLET BY MOUTH EVERY DAY 90 tablet 1  . mycophenolate (CELLCEPT) 500 MG tablet Take 500 mg by mouth daily.    . prednisoLONE acetate (PRED FORTE) 1 % ophthalmic suspension Place 1 drop into both eyes once a week.    . predniSONE (DELTASONE) 5 MG tablet Take  5 mg by mouth daily.    .  rosuvastatin (CRESTOR) 10 MG tablet Take 1 tablet (10 mg total) by mouth at bedtime. 30 tablet 11  . atorvastatin (LIPITOR) 80 MG tablet Take 1 tablet (80 mg total) by mouth daily. (Patient not taking: Reported on 06/06/2019) 90 tablet 1  . calcium carbonate (TUMS) 500 MG chewable tablet Chew 1 tablet (200 mg of elemental calcium total) by mouth 2 (two) times daily. (Patient not taking: Reported on 04/24/2019)    . carvedilol (COREG) 25 MG tablet Take 1 tablet (25 mg total) by mouth 2 (two) times daily. (Patient not taking: Reported on 06/06/2019) 60 tablet 3  . Docusate Calcium (STOOL SOFTENER PO) Take 1 tablet by mouth daily as needed (constipation).     Marland Kitchen levothyroxine (SYNTHROID) 100 MCG tablet Take 1 tablet (100 mcg total) by mouth daily. (Patient not taking: Reported on 06/06/2019) 30 tablet 5  . PROLIA 60 MG/ML SOSY injection Inject 60 mg as directed every 6 (six) months.     No current facility-administered medications on file prior to visit.     BP (!) 122/55 (BP Location: Left Arm, Patient Position: Sitting, Cuff Size: Small)   Pulse 73   Temp 98.5 F (36.9 C) (Oral)   Resp 16   Wt 113 lb (51.3 kg)   SpO2 99%   BMI 18.24 kg/m  Lab Results  Component Value Date   HGBA1C 5.5 07/20/2018  `      Objective:   Physical Exam Constitutional:      Appearance: She is well-developed.  Neck:     Musculoskeletal: Neck supple.     Thyroid: No thyromegaly.  Cardiovascular:     Rate and Rhythm: Normal rate and regular rhythm.     Heart sounds: Normal heart sounds. No murmur.  Pulmonary:     Effort: Pulmonary effort is normal. No respiratory distress.     Breath sounds: Normal breath sounds. No wheezing.  Skin:    General: Skin is warm and dry.  Neurological:     Mental Status: She is alert and oriented to person, place, and time.  Psychiatric:        Behavior: Behavior normal.        Thought Content: Thought content normal.        Judgment: Judgment normal.             Assessment & Plan:  Syncope-of note the patient had a recurrent syncopal event in our lab today.  When I was called to the lab patient was slumped over in the wheelchair.  She was difficult to arouse but ultimately was arousable.  She was noted to be diaphoretic.  She had some brief confusion.  She also had fecal incontinence.  Heart rate was 60 at time of syncopal event.  She was brought down to the emergency department for further evaluation.  Recent port was given to Dr. Vallery Ridge.  Osteoporosis-she was given a Prolia shot today.  Hypertension-blood pressure is noted to be somewhat labile.  It was much higher at her appointment with the neurologist.  I have advised the patient and the daughter to check her blood pressure once daily for a week and send me her readings via my chart for further evaluation.  We will continue current medications at this time.  Lab work was not drawn today in the office due to syncopal event.  Pulmonary nodules-this was a new finding on CT.  Plan to repeat CT in 3 months.

## 2019-06-06 NOTE — Discharge Instructions (Signed)
1.  Schedule a follow-up with your cardiologist as soon as possible.  Continue your scheduled follow-ups with your neurologist and family doctor. 2.  Always be careful when making position changes.  Move very slowly and if you feel dizzy or lightheaded at any point time sit down and elevate your feet. 3.  Return to the emergency department if you get chest pain, shortness of breath or other concerning symptoms.

## 2019-06-06 NOTE — Patient Instructions (Signed)
Please check blood pressure once daily for 1 week then send [place] your readings via mychart Complete lab work prior to leaving.

## 2019-06-06 NOTE — ED Notes (Signed)
Pt aware we need urine specimen.  

## 2019-06-06 NOTE — ED Notes (Signed)
Pt ambulated to RR unassisted 

## 2019-06-28 ENCOUNTER — Other Ambulatory Visit: Payer: Self-pay | Admitting: Family

## 2019-06-28 ENCOUNTER — Telehealth: Payer: Self-pay | Admitting: Family

## 2019-06-28 DIAGNOSIS — Z1239 Encounter for other screening for malignant neoplasm of breast: Secondary | ICD-10-CM

## 2019-06-28 NOTE — Telephone Encounter (Signed)
Please contact pt and ask her how she has been feeling since her visit to the ER? Also let her know that I reviewed her chart and see that she is due for a mammogram. Order has been placed.

## 2019-06-28 NOTE — Telephone Encounter (Signed)
Patient scheduled to come back 08-01-19. Lisinopril refill sent for 30 day supply

## 2019-06-28 NOTE — Telephone Encounter (Signed)
Noted.  I would like to avoid increasing bp meds due to syncopal episodes.  Let's recheck bp in our office in 1 month with me please.

## 2019-06-28 NOTE — Telephone Encounter (Signed)
Patient reports she has been "feling well sins er visit, she will like for Melissa to know her bp has been ranging between 184/87 to 138/72.   Patient advised she will get a call to set up appt for mammogram.

## 2019-07-05 ENCOUNTER — Ambulatory Visit (HOSPITAL_BASED_OUTPATIENT_CLINIC_OR_DEPARTMENT_OTHER)
Admission: RE | Admit: 2019-07-05 | Discharge: 2019-07-05 | Disposition: A | Discharge status: Home / Self Care | Payer: Medicare Other | Source: Ambulatory Visit | Attending: Family | Admitting: Family

## 2019-07-05 ENCOUNTER — Other Ambulatory Visit: Payer: Self-pay

## 2019-07-05 DIAGNOSIS — Z1239 Encounter for other screening for malignant neoplasm of breast: Secondary | ICD-10-CM

## 2019-07-05 DIAGNOSIS — Z1231 Encounter for screening mammogram for malignant neoplasm of breast: Secondary | ICD-10-CM | POA: Diagnosis not present

## 2019-07-24 NOTE — Progress Notes (Signed)
Virtual Visit via Video Note  I connected with patient on 07/25/19 at 10:15 AM EDT by audio enabled telemedicine application and verified that I am speaking with the correct person using two identifiers.   THIS ENCOUNTER IS A VIRTUAL VISIT DUE TO COVID-19 - PATIENT WAS NOT SEEN IN THE OFFICE. PATIENT HAS CONSENTED TO VIRTUAL VISIT / TELEMEDICINE VISIT   Location of patient: home  Location of provider: office  I discussed the limitations of evaluation and management by telemedicine and the availability of in person appointments. The patient expressed understanding and agreed to proceed.   Subjective:   Nancy [name] is a 78 y.o. female who presents for Medicare Annual (Subsequent) preventive examination.  Lost son a month ago. States she has a great support system that calls her or visits daily.  Review of Systems:  Cardiac Risk Factors include: advanced age (>3men, >57 women);dyslipidemia;hypertension Home Safety/Smoke Alarms: Feels safe in home. Smoke alarms in place.  Lives alone. 1st floor apt. Step over tub w/ grab bar and emergency pull cord.   Female:       Mammo-07/06/19       Dexa scan- 07/29/17       CCS- No longer doing routine screening due to age.      Objective:     Vitals: Unable to assess. This visit is enabled though telemedicine due to Covid 19.   Advanced Directives 07/25/2019 06/06/2019 04/24/2019 07/21/2018 11/27/2017 07/19/2017 10/07/2015  Does Patient Have a Medical Advance Directive? Yes No No Yes No Yes No  Type of Paramedic of [place];Living will - - [place];Living will - [place];Living will -  Does patient want to make changes to medical advance directive? No - Patient declined - - - - - -  Copy of [place] in Chart? No - copy requested - - No - copy requested - No - copy requested -  Would patient like information on creating a medical advance directive? - - No -  Patient declined - No - Patient declined - No - patient declined information    Tobacco Social History   Tobacco Use  Smoking Status Current Some Day Smoker  . Packs/day: 0.50  . Years: 32.00  . Pack years: 16.00  . Types: Cigarettes  Smokeless Tobacco Never Used  Tobacco Comment   6 or 7 per day     Ready to quit: Not Answered Counseling given: Not Answered Comment: 6 or 7 per day   Clinical Intake: Pain : No/denies pain    Past Medical History:  Diagnosis Date  . Allergy   . Anemia    iron deficiency  . 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 Cataract   . Colon polyps   . CVA (cerebral vascular accident) ([place]) 02/2017  . Depression   . Hyperlipidemia   . Hypertension   . Osteoporosis 01/01/2015  . Rheumatoid arteritis ([place])   . Thyroid disease   . TIA (transient ischemic attack) 2016  . Urinary incontinence    Past Surgical History:  Procedure Laterality Date  . ABDOMINAL HYSTERECTOMY  1976  . CATARACT EXTRACTION Bilateral   . TUMOR REMOVAL  last was 1976   left ankle x3   Family History  Problem Relation Age of Onset  . Hypertension Father   . Colon cancer Father   . Ulcerative colitis Mother   . Parkinson's disease Mother   . Heart disease Sister 20  CABG x 3  . Cirrhosis Brother   . Alcohol abuse Brother   . Colitis Daughter   . Colon polyps Daughter   . Heart attack Daughter   . Hypertension Son   . Kidney disease Son        transplant due to kidney problems after [facility]  . Arthritis Other   . Coronary artery disease Other   . Hyperlipidemia Other   . Hypertension Other   . Stroke Sister        died 37  . Arthritis Sister   . Hypertension Sister   . Thyroid disease Daughter        x 3  . Cirrhosis Brother 71       ETOH abuse   Social History   Socioeconomic History  . Marital status: Divorced    Spouse name: Not on file  . Number of children: 8  . Years of education: Not on file  . Highest education level: Not on file   Occupational History    Employer: RETIRED    Comment: Retired from [employer]  . Financial resource strain: Not on file  . Food insecurity    Worry: Not on file    Inability: Not on file  . Transportation needs    Medical: Not on file    Non-medical: Not on file  Tobacco Use  . Smoking status: Current Some Day Smoker    Packs/day: 0.50    Years: 32.00    Pack years: 16.00    Types: Cigarettes  . Smokeless tobacco: Never Used  . Tobacco comment: 6 or 7 per day  Substance and Sexual Activity  . Alcohol use: No    Alcohol/week: 0.0 standard drinks  . Drug use: No  . Sexual activity: Never  Lifestyle  . Physical activity    Days per week: Not on file    Minutes per session: Not on file  . Stress: Not on file  Relationships  . Social Herbalist on phone: Not on file    Gets together: Not on file    Attends religious service: Not on file    Active member of club or organization: Not on file    Attends meetings of clubs or organizations: Not on file    Relationship status: Not on file  Other Topics Concern  . Not on file  Social History Narrative  . Not on file    Outpatient Encounter Medications as of 07/25/2019  Medication Sig  . Adalimumab (HUMIRA PEN ) Inject into the skin. Once weekly  . atorvastatin (LIPITOR) 80 MG tablet Take 1 tablet (80 mg total) by mouth daily.  . calcium carbonate (TUMS) 500 MG chewable tablet Chew 1 tablet (200 mg of elemental calcium total) by mouth 2 (two) times daily.  . carvedilol (COREG) 25 MG tablet Take 1 tablet (25 mg total) by mouth 2 (two) times daily.  . clopidogrel (PLAVIX) 75 MG tablet TAKE 1 TABLET BY MOUTH EVERY DAY  . levothyroxine (SYNTHROID) 100 MCG tablet Take 1 tablet (100 mcg total) by mouth daily.  Marland Kitchen loratadine (CLARITIN) 10 MG tablet Take 10 mg by mouth daily.  Marland Kitchen losartan (COZAAR) 100 MG tablet TAKE 1 TABLET BY MOUTH EVERY DAY  . mycophenolate (CELLCEPT) 500 MG tablet Take 500 mg by mouth  daily.  . prednisoLONE acetate (PRED FORTE) 1 % ophthalmic suspension Place 1 drop into both eyes once a week.  . predniSONE (DELTASONE) 5 MG  tablet Take 5 mg by mouth daily.  Marland Kitchen PROLIA 60 MG/ML SOSY injection Inject 60 mg as directed every 6 (six) months.  . rosuvastatin (CRESTOR) 10 MG tablet Take 1 tablet (10 mg total) by mouth at bedtime.  . cilostazol (PLETAL) 100 MG tablet Take 100 mg by mouth 2 (two) times daily.  Mariane Baumgarten Calcium (STOOL SOFTENER PO) Take 1 tablet by mouth daily as needed (constipation).   Marland Kitchen leflunomide (ARAVA) 10 MG tablet Take 10 mg by mouth daily.   No facility-administered encounter medications on file as of 07/25/2019.     Activities of Daily Living In your present state of health, do you have any difficulty performing the following activities: 07/25/2019  Hearing? N  Vision? N  Difficulty concentrating or making decisions? N  Walking or climbing stairs? N  Dressing or bathing? N  Doing errands, shopping? Y  Comment no longer driving. Daughter take her anywhere  Preparing Food and eating ? N  Using the Toilet? N  In the past six months, have you accidently leaked urine? N  Do you have problems with loss of bowel control? N  Managing your Medications? N  Managing your Finances? N  Housekeeping or managing your Housekeeping? Y  Some recent data might be hidden    Patient Care Team: Debbrah Alar, NP as PCP - General Phadke, Karsten Ro, MD as Consulting Physician (Endocrinology) [place] Crane, MD as Referring Physician (Rheumatology) Bo Merino, MD as Consulting Physician (Rheumatology)    Assessment:   This is a routine wellness examination for [place]. Physical assessment deferred to PCP.  Exercise Activities and Dietary recommendations Current Exercise Habits: The patient does not participate in regular exercise at present, Exercise limited by: None identified Diet (meal preparation, eat out, water intake, caffeinated beverages, dairy  products, fruits and vegetables): in general, a "healthy" diet  , well balanced   Goals    . renew strength and balance       Fall Risk Fall Risk  07/25/2019 07/21/2018 04/11/2018 07/19/2017 06/18/2017  Falls in the past year? 0 No No No No  Number falls in past yr: - - - - -  Injury with Fall? - - - - -     Depression Screen PHQ 2/9 Scores 07/25/2019 07/21/2018 07/19/2017 06/18/2017  PHQ - 2 Score 1 1 1 3   PHQ- 9 Score - - 4 13     Cognitive Function  MMSE - Mini Mental State Exam 07/21/2018 07/19/2017  Orientation to time 5 5  Orientation to Place 5 5  Registration 3 3  Attention/ Calculation 5 5  Recall 3 2  Language- name 2 objects 2 2  Language- repeat 1 0  Language- follow 3 step command 3 3  Language- read & follow direction 1 1  Write a sentence 1 1  Copy design 1 1  Total score 30 28     6CIT Screen 07/25/2019  What Year? 0 points  What month? 0 points  What time? 0 points  Count back from 20 0 points  Months in reverse 0 points  Repeat phrase 4 points  Total Score 4    Immunization History  Administered Date(s) Administered  . Influenza Split 10/16/2011  . Influenza Whole 09/10/2009, 08/18/2010  . Influenza, High Dose Seasonal PF 09/10/2015, 08/31/2016, 07/19/2017, 08/03/2018  . Influenza,inj,Quad PF,6+ Mos 09/05/2014  . Influenza,inj,quad, With Preservative 07/19/2017  . Influenza-Unspecified 08/12/2012, 08/09/2013  . Pneumococcal Conjugate-13 09/05/2014  . Pneumococcal Polysaccharide-23 01/01/2009  . Td 12/26/2014  Screening Tests Health Maintenance  Topic Date Due  . INFLUENZA VACCINE  06/10/2019  . COLONOSCOPY  10/22/2020  . TETANUS/TDAP  12/26/2024  . DEXA SCAN  Completed  . PNA vac Low Risk Adult  Completed      Plan:   See you next year!  Continue to eat heart healthy diet (full of fruits, vegetables, whole grains, lean protein, water--limit salt, fat, and sugar intake) and increase physical activity as tolerated.  Continue doing brain  stimulating activities (puzzles, reading, adult coloring books, staying active) to keep memory sharp.     I have personally reviewed and noted the following in the patient's chart:   . Medical and social history . Use of alcohol, tobacco or illicit drugs  . Current medications and supplements . Functional ability and status . Nutritional status . Physical activity . Advanced directives . List of other physicians . Hospitalizations, surgeries, and ER visits in previous 12 months . Vitals . Screenings to include cognitive, depression, and falls . Referrals and appointments  In addition, I have reviewed and discussed with patient certain preventive protocols, quality metrics, and best practice recommendations. A written personalized care plan for preventive services as well as general preventive health recommendations were provided to patient.     Shela Nevin, [place]  07/25/2019

## 2019-07-25 ENCOUNTER — Ambulatory Visit (INDEPENDENT_AMBULATORY_CARE_PROVIDER_SITE_OTHER): Payer: Medicare Other | Admitting: *Deleted

## 2019-07-25 ENCOUNTER — Encounter: Payer: Self-pay | Admitting: *Deleted

## 2019-07-25 ENCOUNTER — Other Ambulatory Visit: Payer: Self-pay

## 2019-07-25 DIAGNOSIS — Z0000 Encounter for general adult medical examination without abnormal findings: Secondary | ICD-10-CM | POA: Diagnosis not present

## 2019-07-25 NOTE — Patient Instructions (Signed)
See you next year!  Continue to eat heart healthy diet (full of fruits, vegetables, whole grains, lean protein, water--limit salt, fat, and sugar intake) and increase physical activity as tolerated.  Continue doing brain stimulating activities (puzzles, reading, adult coloring books, staying active) to keep memory sharp.    Nancy [name] , Thank you for taking time to come for your Medicare Wellness Visit. I appreciate your ongoing commitment to your health goals. Please review the following plan we discussed and let me know if I can assist you in the future.   These are the goals we discussed: Goals    . renew strength and balance       This is a list of the screening recommended for you and due dates:  Health Maintenance  Topic Date Due  . Flu Shot  06/10/2019  . Colon Cancer Screening  10/22/2020  . Tetanus Vaccine  12/26/2024  . DEXA scan (bone density measurement)  Completed  . Pneumonia vaccines  Completed    Health Maintenance After Age 9 After age 47, you are at a higher risk for certain long-term diseases and infections as well as injuries from falls. Falls are a major cause of broken bones and head injuries in people who are older than age 47. Getting regular preventive care can help to keep you healthy and well. Preventive care includes getting regular testing and making lifestyle changes as recommended by your health care provider. Talk with your health care provider about:  Which screenings and tests you should have. A screening is a test that checks for a disease when you have no symptoms.  A diet and exercise plan that is right for you. What should I know about screenings and tests to prevent falls? Screening and testing are the best ways to find a health problem early. Early diagnosis and treatment give you the best chance of managing medical conditions that are common after age 40. Certain conditions and lifestyle choices may make you more likely to have a fall. Your  health care provider may recommend:  Regular vision checks. Poor vision and conditions such as cataracts can make you more likely to have a fall. If you wear glasses, make sure to get your prescription updated if your vision changes.  Medicine review. Work with your health care provider to regularly review all of the medicines you are taking, including over-the-counter medicines. Ask your health care provider about any side effects that may make you more likely to have a fall. Tell your health care provider if any medicines that you take make you feel dizzy or sleepy.  Osteoporosis screening. Osteoporosis is a condition that causes the bones to get weaker. This can make the bones weak and cause them to break more easily.  Blood pressure screening. Blood pressure changes and medicines to control blood pressure can make you feel dizzy.  Strength and balance checks. Your health care provider may recommend certain tests to check your strength and balance while standing, walking, or changing positions.  Foot health exam. Foot pain and numbness, as well as not wearing proper footwear, can make you more likely to have a fall.  Depression screening. You may be more likely to have a fall if you have a fear of falling, feel emotionally low, or feel unable to do activities that you used to do.  Alcohol use screening. Using too much alcohol can affect your balance and may make you more likely to have a fall. What actions can I take  to lower my risk of falls? General instructions  Talk with your health care provider about your risks for falling. Tell your health care provider if: ? You fall. Be sure to tell your health care provider about all falls, even ones that seem minor. ? You feel dizzy, sleepy, or off-balance.  Take over-the-counter and prescription medicines only as told by your health care provider. These include any supplements.  Eat a healthy diet and maintain a healthy weight. A healthy diet  includes low-fat dairy products, low-fat (lean) meats, and fiber from whole grains, beans, and lots of fruits and vegetables. Home safety  Remove any tripping hazards, such as rugs, cords, and clutter.  Install safety equipment such as grab bars in bathrooms and safety rails on stairs.  Keep rooms and walkways well-lit. Activity   Follow a regular exercise program to stay fit. This will help you maintain your balance. Ask your health care provider what types of exercise are appropriate for you.  If you need a cane or walker, use it as recommended by your health care provider.  Wear supportive shoes that have nonskid soles. Lifestyle  Do not drink alcohol if your health care provider tells you not to drink.  If you drink alcohol, limit how much you have: ? 0-1 drink a day for women. ? 0-2 drinks a day for men.  Be aware of how much alcohol is in your drink. In the [place] one drink equals one typical bottle of beer (12 oz), one-half glass of wine (5 oz), or one shot of hard liquor (1 oz).  Do not use any products that contain nicotine or tobacco, such as cigarettes and e-cigarettes. If you need help quitting, ask your health care provider. Summary  Having a healthy lifestyle and getting preventive care can help to protect your health and wellness after age 75.  Screening and testing are the best way to find a health problem early and help you avoid having a fall. Early diagnosis and treatment give you the best chance for managing medical conditions that are more common for people who are older than age 36.  Falls are a major cause of broken bones and head injuries in people who are older than age 36. Take precautions to prevent a fall at home.  Work with your health care provider to learn what changes you can make to improve your health and wellness and to prevent falls. This information is not intended to replace advice given to you by your health care provider. Make sure you  discuss any questions you have with your health care provider. Document Released: 09/08/2017 Document Revised: 02/16/2019 Document Reviewed: 09/08/2017 Elsevier Patient Education  2020 [employer].

## 2019-07-31 ENCOUNTER — Ambulatory Visit (INDEPENDENT_AMBULATORY_CARE_PROVIDER_SITE_OTHER): Payer: Medicare Other | Admitting: Surgery

## 2019-07-31 ENCOUNTER — Encounter: Payer: Self-pay | Admitting: Surgery

## 2019-07-31 ENCOUNTER — Other Ambulatory Visit: Payer: Self-pay | Admitting: *Deleted

## 2019-07-31 ENCOUNTER — Encounter: Payer: Self-pay | Admitting: *Deleted

## 2019-07-31 ENCOUNTER — Other Ambulatory Visit: Payer: Self-pay

## 2019-07-31 VITALS — BP 152/82 | HR 76 | Temp 97.5°F | Resp 20 | Ht 65.0 in | Wt 109.0 lb

## 2019-07-31 DIAGNOSIS — I70211 Atherosclerosis of native arteries of extremities with intermittent claudication, right leg: Secondary | ICD-10-CM

## 2019-07-31 NOTE — Progress Notes (Signed)
[facility] [place]  Patient name: Nancy [name] MRN: [id number] DOB: 1941/06/21 Sex: female   REASON FOR VISIT:    Follow up  [place]:    Nancy [name] is a 78 y.o. female who I saw in June 2020 for leg pain.  At that time she found it difficult to specifically describe her symptoms however her right leg bothers her more than the left, and her symptoms appear to get worse with walking approximately 100 yards.  She described calf and foot pain.  Her ABI was 0.51 on the right and 1.0 on the left.  I started her on a statin at that time as well as cilostazol and an exercise program.  She is back today for follow-up.  She did not tolerate the cilostazol because of diarrhea.  She states that she cannot do her daily activities because of her right leg pain and wishes to have something done.   The patient is medically managed for hypertension with an ARB.  She also suffers from rheumatoid arthritis which is been stable.  She continues to take her statin.  PAST MEDICAL HISTORY:   Past Medical History:  Diagnosis Date  . Allergy   . Anemia    iron deficiency  . Cancer ([place]) 1995   vaginal  . Cataract   . Colon polyps   . CVA (cerebral vascular accident) ([place]) 02/2017  . Depression   . Hyperlipidemia   . Hypertension   . Osteoporosis 01/01/2015  . Rheumatoid arteritis ([place])   . Thyroid disease   . TIA (transient ischemic attack) 2016  . Urinary incontinence      FAMILY HISTORY:   Family History  Problem Relation Age of Onset  . Hypertension Father   . Colon cancer Father   . Ulcerative colitis Mother   . Parkinson's disease Mother   . Heart disease Sister 63       CABG x 3  . Cirrhosis Brother   . Alcohol abuse Brother   . Colitis Daughter   . Colon polyps Daughter   . Heart attack Daughter   . Hypertension Son   . Kidney disease Son        transplant due to kidney problems after [facility]   . Arthritis Other   . Coronary artery disease Other   . Hyperlipidemia Other   . Hypertension Other   . Stroke Sister        died 39  . Arthritis Sister   . Hypertension Sister   . Thyroid disease Daughter        x 3  . Cirrhosis Brother 64       ETOH abuse    SOCIAL HISTORY:   Social History   Tobacco Use  . Smoking status: Current Some Day Smoker    Packs/day: 0.50    Years: 32.00    Pack years: 16.00    Types: Cigarettes  . Smokeless tobacco: Never Used  . Tobacco comment: 6 or 7 per day  Substance Use Topics  . Alcohol use: No    Alcohol/week: 0.0 standard drinks     ALLERGIES:   Allergies  Allergen Reactions  . Amlodipine Other (See Comments)    dizziness  . Iron Nausea And Vomiting    Can take infusions      CURRENT MEDICATIONS:   Current Outpatient Medications  Medication Sig Dispense Refill  . Adalimumab (HUMIRA PEN [place]) Inject into the skin. Once weekly    .  atorvastatin (LIPITOR) 80 MG tablet Take 1 tablet (80 mg total) by mouth daily. 90 tablet 1  . calcium carbonate (TUMS) 500 MG chewable tablet Chew 1 tablet (200 mg of elemental calcium total) by mouth 2 (two) times daily.    . carvedilol (COREG) 25 MG tablet Take 1 tablet (25 mg total) by mouth 2 (two) times daily. 60 tablet 3  . cilostazol (PLETAL) 100 MG tablet Take 100 mg by mouth 2 (two) times daily.    . clopidogrel (PLAVIX) 75 MG tablet TAKE 1 TABLET BY MOUTH EVERY DAY 90 tablet 1  . Docusate Calcium (STOOL SOFTENER PO) Take 1 tablet by mouth daily as needed (constipation).     Marland Kitchen leflunomide (ARAVA) 10 MG tablet Take 10 mg by mouth daily.  3  . levothyroxine (SYNTHROID) 100 MCG tablet Take 1 tablet (100 mcg total) by mouth daily. 30 tablet 5  . loratadine (CLARITIN) 10 MG tablet Take 10 mg by mouth daily.    Marland Kitchen losartan (COZAAR) 100 MG tablet TAKE 1 TABLET BY MOUTH EVERY DAY 30 tablet 0  . mycophenolate (CELLCEPT) 500 MG tablet Take 500 mg by mouth daily.    . prednisoLONE acetate (PRED  FORTE) 1 % ophthalmic suspension Place 1 drop into both eyes once a week.    . predniSONE (DELTASONE) 5 MG tablet Take 5 mg by mouth daily.    Marland Kitchen PROLIA 60 MG/ML SOSY injection Inject 60 mg as directed every 6 (six) months.    . rosuvastatin (CRESTOR) 10 MG tablet Take 1 tablet (10 mg total) by mouth at bedtime. 30 tablet 11   No current facility-administered medications for this visit.     REVIEW OF SYSTEMS:   [X]  denotes positive finding, [ ]  denotes negative finding Cardiac  Comments:  Chest pain or chest pressure:    Shortness of breath upon exertion:    Short of breath when lying flat:    Irregular heart rhythm:        Vascular    Pain in calf, thigh, or hip brought on by ambulation:    Pain in feet at night that wakes you up from your sleep:  x   Blood clot in your veins:    Leg swelling:         Pulmonary    Oxygen at home:    Productive cough:     Wheezing:         Neurologic    Sudden weakness in arms or legs:     Sudden numbness in arms or legs:     Sudden onset of difficulty speaking or slurred speech:    Temporary loss of vision in one eye:     Problems with dizziness:         Gastrointestinal    Blood in stool:     Vomited blood:         Genitourinary    Burning when urinating:     Blood in urine:        Psychiatric    Major depression:         Hematologic    Bleeding problems:    Problems with blood clotting too easily:        Skin    Rashes or ulcers:        Constitutional    Fever or chills:      PHYSICAL EXAM:   Vitals:   07/31/19 1014  BP: (!) 152/82  Pulse: 76  Resp: 20  Temp: (!)  97.5 F (36.4 C)  SpO2: 99%  Weight: 49.4 kg  Height: 5\' 5"  (1.651 m)    GENERAL: The patient is a well-nourished female, in no acute distress. The vital signs are documented above. CARDIAC: There is a regular rate and rhythm.  VASCULAR: Nonpalpable right pedal pulse PULMONARY: Non-labored respirations ABDOMEN: Soft and non-tender with normal  pitched bowel sounds.  MUSCULOSKELETAL: There are no major deformities or cyanosis. NEUROLOGIC: No focal weakness or paresthesias are detected. SKIN: There are no ulcers or rashes noted. PSYCHIATRIC: The patient has a normal affect.  STUDIES:   None  MEDICAL ISSUES:   Right leg claudication: The patient has failed nonprocedural based therapy.  She remains on maximal medical therapy including statin, antiplatelet therapy.  She did not tolerate cilostazol.  She would like to proceed with intervention.  I have scheduled her for angiography on Tuesday, October 20.  This will be via a left femoral approach with intervention on the right leg.  I discussed with members of the procedure including the risk of access site bleeding, distal embolization, limited durability of stenting, and the need for surgical correction.  All of her questions were answered.    Leia Alf, MD, FACS [facility] [phone] Pager [phone]

## 2019-07-31 NOTE — H&P (View-Only) (Signed)
[facility] [place]  Patient name: Nancy [name] MRN: [id number] DOB: 07-02-1941 Sex: female   REASON FOR VISIT:    Follow up  [place]:    MARCIEL [name] is a 78 y.o. female who I saw in June 2020 for leg pain.  At that time she found it difficult to specifically describe her symptoms however her right leg bothers her more than the left, and her symptoms appear to get worse with walking approximately 100 yards.  She described calf and foot pain.  Her ABI was 0.51 on the right and 1.0 on the left.  I started her on a statin at that time as well as cilostazol and an exercise program.  She is back today for follow-up.  She did not tolerate the cilostazol because of diarrhea.  She states that she cannot do her daily activities because of her right leg pain and wishes to have something done.   The patient is medically managed for hypertension with an ARB.  She also suffers from rheumatoid arthritis which is been stable.  She continues to take her statin.  PAST MEDICAL HISTORY:   Past Medical History:  Diagnosis Date  . Allergy   . Anemia    iron deficiency  . 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 Cataract   . Colon polyps   . CVA (cerebral vascular accident) ([place]) 02/2017  . Depression   . Hyperlipidemia   . Hypertension   . Osteoporosis 01/01/2015  . Rheumatoid arteritis ([place])   . Thyroid disease   . TIA (transient ischemic attack) 2016  . Urinary incontinence      FAMILY HISTORY:   Family History  Problem Relation Age of Onset  . Hypertension Father   . Colon cancer Father   . Ulcerative colitis Mother   . Parkinson's disease Mother   . Heart disease Sister 69       CABG x 3  . Cirrhosis Brother   . Alcohol abuse Brother   . Colitis Daughter   . Colon polyps Daughter   . Heart attack Daughter   . Hypertension Son   . Kidney disease Son        transplant due to kidney problems after [facility]   . Arthritis Other   . Coronary artery disease Other   . Hyperlipidemia Other   . Hypertension Other   . Stroke Sister        died 46  . Arthritis Sister   . Hypertension Sister   . Thyroid disease Daughter        x 3  . Cirrhosis Brother 70       ETOH abuse    SOCIAL HISTORY:   Social History   Tobacco Use  . Smoking status: Current Some Day Smoker    Packs/day: 0.50    Years: 32.00    Pack years: 16.00    Types: Cigarettes  . Smokeless tobacco: Never Used  . Tobacco comment: 6 or 7 per day  Substance Use Topics  . Alcohol use: No    Alcohol/week: 0.0 standard drinks     ALLERGIES:   Allergies  Allergen Reactions  . Amlodipine Other (See Comments)    dizziness  . Iron Nausea And Vomiting    Can take infusions      CURRENT MEDICATIONS:   Current Outpatient Medications  Medication Sig Dispense Refill  . Adalimumab (HUMIRA PEN [place]) Inject into the skin. Once weekly    .  atorvastatin (LIPITOR) 80 MG tablet Take 1 tablet (80 mg total) by mouth daily. 90 tablet 1  . calcium carbonate (TUMS) 500 MG chewable tablet Chew 1 tablet (200 mg of elemental calcium total) by mouth 2 (two) times daily.    . carvedilol (COREG) 25 MG tablet Take 1 tablet (25 mg total) by mouth 2 (two) times daily. 60 tablet 3  . cilostazol (PLETAL) 100 MG tablet Take 100 mg by mouth 2 (two) times daily.    . clopidogrel (PLAVIX) 75 MG tablet TAKE 1 TABLET BY MOUTH EVERY DAY 90 tablet 1  . Docusate Calcium (STOOL SOFTENER PO) Take 1 tablet by mouth daily as needed (constipation).     Marland Kitchen leflunomide (ARAVA) 10 MG tablet Take 10 mg by mouth daily.  3  . levothyroxine (SYNTHROID) 100 MCG tablet Take 1 tablet (100 mcg total) by mouth daily. 30 tablet 5  . loratadine (CLARITIN) 10 MG tablet Take 10 mg by mouth daily.    Marland Kitchen losartan (COZAAR) 100 MG tablet TAKE 1 TABLET BY MOUTH EVERY DAY 30 tablet 0  . mycophenolate (CELLCEPT) 500 MG tablet Take 500 mg by mouth daily.    . prednisoLONE acetate (PRED  FORTE) 1 % ophthalmic suspension Place 1 drop into both eyes once a week.    . predniSONE (DELTASONE) 5 MG tablet Take 5 mg by mouth daily.    Marland Kitchen PROLIA 60 MG/ML SOSY injection Inject 60 mg as directed every 6 (six) months.    . rosuvastatin (CRESTOR) 10 MG tablet Take 1 tablet (10 mg total) by mouth at bedtime. 30 tablet 11   No current facility-administered medications for this visit.     REVIEW OF SYSTEMS:   [X]  denotes positive finding, [ ]  denotes negative finding Cardiac  Comments:  Chest pain or chest pressure:    Shortness of breath upon exertion:    Short of breath when lying flat:    Irregular heart rhythm:        Vascular    Pain in calf, thigh, or hip brought on by ambulation:    Pain in feet at night that wakes you up from your sleep:  x   Blood clot in your veins:    Leg swelling:         Pulmonary    Oxygen at home:    Productive cough:     Wheezing:         Neurologic    Sudden weakness in arms or legs:     Sudden numbness in arms or legs:     Sudden onset of difficulty speaking or slurred speech:    Temporary loss of vision in one eye:     Problems with dizziness:         Gastrointestinal    Blood in stool:     Vomited blood:         Genitourinary    Burning when urinating:     Blood in urine:        Psychiatric    Major depression:         Hematologic    Bleeding problems:    Problems with blood clotting too easily:        Skin    Rashes or ulcers:        Constitutional    Fever or chills:      PHYSICAL EXAM:   Vitals:   07/31/19 1014  BP: (!) 152/82  Pulse: 76  Resp: 20  Temp: (!)  97.5 F (36.4 C)  SpO2: 99%  Weight: 49.4 kg  Height: 5\' 5"  (1.651 m)    GENERAL: The patient is a well-nourished female, in no acute distress. The vital signs are documented above. CARDIAC: There is a regular rate and rhythm.  VASCULAR: Nonpalpable right pedal pulse PULMONARY: Non-labored respirations ABDOMEN: Soft and non-tender with normal  pitched bowel sounds.  MUSCULOSKELETAL: There are no major deformities or cyanosis. NEUROLOGIC: No focal weakness or paresthesias are detected. SKIN: There are no ulcers or rashes noted. PSYCHIATRIC: The patient has a normal affect.  STUDIES:   None  MEDICAL ISSUES:   Right leg claudication: The patient has failed nonprocedural based therapy.  She remains on maximal medical therapy including statin, antiplatelet therapy.  She did not tolerate cilostazol.  She would like to proceed with intervention.  I have scheduled her for angiography on Tuesday, October 20.  This will be via a left femoral approach with intervention on the right leg.  I discussed with members of the procedure including the risk of access site bleeding, distal embolization, limited durability of stenting, and the need for surgical correction.  All of her questions were answered.    Leia Alf, MD, FACS [facility] [phone] Pager [phone]

## 2019-08-01 ENCOUNTER — Telehealth: Payer: Self-pay | Admitting: Family

## 2019-08-01 ENCOUNTER — Ambulatory Visit (INDEPENDENT_AMBULATORY_CARE_PROVIDER_SITE_OTHER): Payer: Medicare Other | Admitting: Family

## 2019-08-01 ENCOUNTER — Encounter: Payer: Self-pay | Admitting: Family

## 2019-08-01 ENCOUNTER — Other Ambulatory Visit: Payer: Self-pay

## 2019-08-01 VITALS — BP 175/69 | HR 77 | Temp 96.4°F | Resp 16 | Wt 109.6 lb

## 2019-08-01 DIAGNOSIS — R2681 Unsteadiness on feet: Secondary | ICD-10-CM | POA: Diagnosis not present

## 2019-08-01 DIAGNOSIS — I10 Essential (primary) hypertension: Secondary | ICD-10-CM

## 2019-08-01 DIAGNOSIS — Z23 Encounter for immunization: Secondary | ICD-10-CM

## 2019-08-01 DIAGNOSIS — E559 Vitamin D deficiency, unspecified: Secondary | ICD-10-CM | POA: Diagnosis not present

## 2019-08-01 DIAGNOSIS — R739 Hyperglycemia, unspecified: Secondary | ICD-10-CM

## 2019-08-01 DIAGNOSIS — I739 Peripheral vascular disease, unspecified: Secondary | ICD-10-CM

## 2019-08-01 DIAGNOSIS — M810 Age-related osteoporosis without current pathological fracture: Secondary | ICD-10-CM

## 2019-08-01 DIAGNOSIS — E538 Deficiency of other specified B group vitamins: Secondary | ICD-10-CM

## 2019-08-01 LAB — BASIC METABOLIC PANEL
BUN: 9 mg/dL (ref 6–23)
CO2: 27 mEq/L (ref 19–32)
Calcium: 9.4 mg/dL (ref 8.4–10.5)
Chloride: 103 mEq/L (ref 96–112)
Creatinine, Ser: 0.64 mg/dL (ref 0.40–1.20)
GFR: 108.49 mL/min (ref >60.00)
Glucose, Bld: 100 mg/dL — ABNORMAL HIGH (ref 70–99)
Potassium: 4.1 mEq/L (ref 3.5–5.1)
Sodium: 139 mEq/L (ref 135–145)

## 2019-08-01 LAB — HEMOGLOBIN A1C: Hgb A1c MFr Bld: 6.2 % (ref 4.6–6.5)

## 2019-08-01 MED ORDER — GABAPENTIN 100 MG PO CAPS: 100.0000 mg | ORAL_CAPSULE | Freq: Three times a day (TID) | ORAL | 3 refills | Status: DC

## 2019-08-01 NOTE — Telephone Encounter (Signed)
Form placed on provider's folder 

## 2019-08-01 NOTE — Addendum Note (Signed)
Addended by: Jiles Prows on: 08/01/2019 10:36 AM   Modules accepted: Orders

## 2019-08-01 NOTE — Progress Notes (Signed)
Subjective:    [name], female    DOB: July 04, 1941, 78 y.o.   MRN: [id number]  HPI  Patient is a 78 yr old female who presents today for follow up. She was last seen on 06/06/19 and had a syncopal episode in our office. She denies any further episodes of syncope since that time.   HTN- Current bp medications include coreg 25mg  bid, losartan 100mg  daily.  Reports that she has not taken her AM coreg yet because she is waiting to eat.   BP Readings from Last 3 Encounters:  08/01/19 (!) 175/69  07/31/19 (!) 152/82  06/06/19 (!) 143/77   Osteoporosis- received prolia shot on 7/28  Reports that she has been feeling down. Lost her son, COVID-19 isolation. Feels like she is managing OK.    PVD- She is scheduled for a vascular procedure for stent placement on 08/29/19.  Still smoking.    Pulmonary nodules- will need follow up CT mid October.   She complains of burning pain in bilateral feet.   Also c/o poor balance and fear of falling.   Review of Systems   See HPI   Past Medical History:  Diagnosis Date  . Allergy   . Anemia    iron deficiency  . 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 Cataract   . Colon polyps   . CVA (cerebral vascular accident) ([place]) 02/2017  . Depression   . Hyperlipidemia   . Hypertension   . Osteoporosis 01/01/2015  . Rheumatoid arteritis ([place])   . Thyroid disease   . TIA (transient ischemic attack) 2016  . Urinary incontinence      Social History   Socioeconomic History  . Marital status: Divorced    Spouse name: Not on file  . Number of children: 8  . Years of education: Not on file  . Highest education level: Not on file  Occupational History    Employer: RETIRED    Comment: Retired from [employer]  . Financial resource strain: Not on file  . Food insecurity    Worry: Not on file    Inability: Not on file  . Transportation needs    Medical: Not on file    Non-medical: Not on file  Tobacco Use  .  Smoking status: Current Some Day Smoker    Packs/day: 0.50    Years: 32.00    Pack years: 16.00    Types: Cigarettes  . Smokeless tobacco: Never Used  . Tobacco comment: 6 or 7 per day  Substance and Sexual Activity  . Alcohol use: No    Alcohol/week: 0.0 standard drinks  . Drug use: No  . Sexual activity: Never  Lifestyle  . Physical activity    Days per week: Not on file    Minutes per session: Not on file  . Stress: Not on file  Relationships  . Social Herbalist on phone: Not on file    Gets together: Not on file    Attends religious service: Not on file    Active member of club or organization: Not on file    Attends meetings of clubs or organizations: Not on file    Relationship status: Not on file  . Intimate partner violence    Fear of current or ex partner: Not on file    Emotionally abused: Not on file    Physically abused: Not on file    Forced sexual activity: Not  on file  Other Topics Concern  . Not on file  Social History Narrative  . Not on file    Past Surgical History:  Procedure Laterality Date  . ABDOMINAL HYSTERECTOMY  1976  . CATARACT EXTRACTION Bilateral   . 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 Hypertension Father   . Colon cancer Father   . Ulcerative colitis Mother   . Parkinson's disease Mother   . Heart disease Sister 44       CABG x 3  . Cirrhosis Brother   . Alcohol abuse Brother   . Colitis Daughter   . Colon polyps Daughter   . Heart attack Daughter   . Hypertension Son   . Kidney disease Son        transplant due to kidney problems after [facility]  . Arthritis Other   . Coronary artery disease Other   . Hyperlipidemia Other   . Hypertension Other   . Stroke Sister        died 44  . Arthritis Sister   . Hypertension Sister   . Thyroid disease Daughter        x 3  . Cirrhosis Brother 65       ETOH abuse    Allergies  Allergen Reactions  .  Amlodipine Other (See Comments)    dizziness  . Iron Nausea And Vomiting    Can take infusions     Current Outpatient Medications on File Prior to Visit  Medication Sig Dispense Refill  . Adalimumab (HUMIRA PEN [place]) Inject into the skin. Once weekly    . atorvastatin (LIPITOR) 80 MG tablet Take 1 tablet (80 mg total) by mouth daily. 90 tablet 1  . carvedilol (COREG) 25 MG tablet Take 1 tablet (25 mg total) by mouth 2 (two) times daily. 60 tablet 3  . cilostazol (PLETAL) 100 MG tablet Take 100 mg by mouth 2 (two) times daily.    . clopidogrel (PLAVIX) 75 MG tablet TAKE 1 TABLET BY MOUTH EVERY DAY 90 tablet 1  . Docusate Calcium (STOOL SOFTENER PO) Take 1 tablet by mouth daily as needed (constipation).     Marland Kitchen leflunomide (ARAVA) 10 MG tablet Take 10 mg by mouth daily.  3  . levothyroxine (SYNTHROID) 100 MCG tablet Take 1 tablet (100 mcg total) by mouth daily. 30 tablet 5  . loratadine (CLARITIN) 10 MG tablet Take 10 mg by mouth daily.    Marland Kitchen losartan (COZAAR) 100 MG tablet TAKE 1 TABLET BY MOUTH EVERY DAY 30 tablet 0  . mycophenolate (CELLCEPT) 500 MG tablet Take 500 mg by mouth daily.    . prednisoLONE acetate (PRED FORTE) 1 % ophthalmic suspension Place 1 drop into both eyes once a week.    . predniSONE (DELTASONE) 5 MG tablet Take 5 mg by mouth daily.    Marland Kitchen PROLIA 60 MG/ML SOSY injection Inject 60 mg as directed every 6 (six) months.    . rosuvastatin (CRESTOR) 10 MG tablet Take 1 tablet (10 mg total) by mouth at bedtime. 30 tablet 11   No current facility-administered medications on file prior to visit.     BP (!) 175/69 (BP Location: Right Arm, Patient Position: Sitting, Cuff Size: Small)   Pulse 77   Temp (!) 96.4 F (35.8 C) (Temporal)   Resp 16   Wt 109 lb 9.6 oz (49.7 kg)   SpO2 100%  BMI 18.24 kg/m       Objective:   Physical Exam Constitutional:      Appearance: She is well-developed.  Cardiovascular:     Rate and Rhythm: Normal rate and regular rhythm.     Heart  sounds: Normal heart sounds. No murmur.  Pulmonary:     Effort: Pulmonary effort is normal. No respiratory distress.     Breath sounds: Normal breath sounds. No wheezing.  Psychiatric:        Behavior: Behavior normal.        Thought Content: Thought content normal.        Judgment: Judgment normal.           Assessment & Plan:  PVD- has upcoming procedure scheduled 08/29/19 with Dr. Trula Slade.  Discussed importance of tobacco cessation.  Hyperglycemia-will obtain follow-up A1c.  Hypertension-blood pressure is elevated today.  She has brought with her today her home blood pressure machine which is not functioning properly.  Keeps getting an error message.  I advised the patient to change the batteries in her machine.  If she is still getting an error message she will need to purchase a new machine.  I have asked her to check her blood pressure once daily for 1 week and then send me her readings via my chart or call them into the front desk.  She has not taken her a.m. dose of carvedilol.  Therefore I do not want to adjust her medications at this time.  Pulmonary nodules-she will be due for follow-up CT of her chest in mid October.  Neuropathy- history consistent with peripheral neuropathy.  Will give trial of gabapentin 100 mg 3 times daily.  Osteoporosis- she is up-to-date on her Prolia injection.  Next injection will be due in the end of January.  Vitamin D deficiency- most recent vitamin D level was actually elevated.  She states she is not taking any vitamin D supplement but has taken some calcium supplements.  Will obtain follow-up vitamin D level.  Gait instability- will refer for home health PT for gait training.  History of syncope- she has had extensive work-up in the past by neurology for possible seizure seizure activity and this was unremarkable.  Her last visit with neurology it was felt that her syncopal event was likely secondary to dehydration in the setting of diarrhea  from side effects of her Pletal.  We will continue to monitor.  High-dose flu shot today.  b12 deficiency- check follow up b12 level.

## 2019-08-01 NOTE — Telephone Encounter (Signed)
Patient dropped off handicap placard form to be completed by PCP. Requested form to be mailed to the address on file. Placed in provider tray.

## 2019-08-01 NOTE — Patient Instructions (Addendum)
Please complete lab work prior to leaving. Check blood pressure once daily and send me your readings via mychart in 1 week or you can phone in with the results. Start gabapentin 3 times daily for foot pain.

## 2019-08-02 ENCOUNTER — Other Ambulatory Visit: Payer: Self-pay | Admitting: Family

## 2019-08-04 LAB — VITAMIN D 1,25 DIHYDROXY
Vitamin D 1, 25 (OH)2 Total: 60 pg/mL (ref 18–72)
Vitamin D2 1, 25 (OH)2: <8 pg/mL
Vitamin D3 1, 25 (OH)2: 60 pg/mL

## 2019-08-10 NOTE — Telephone Encounter (Signed)
Will check with pcp tomorrow (not in office today) to see if forms are completed.

## 2019-08-10 NOTE — Telephone Encounter (Signed)
Patient is calling to check status of her handicapped paper work.  Patient states she has not received anything in the mail and wants to make sure it was completed and sent. Can we call back patient and let her know a status Call back [phone]

## 2019-08-11 NOTE — Telephone Encounter (Signed)
Patient advised form was filled out last week and sent to her in the mail as she requested. She reports she has not seen it but she will check again today. I told her we have handicap forms at the office and we will fill another one out for her. I will contact her again later to see if she found the form or if she will like to come by to get a new one.

## 2019-08-25 ENCOUNTER — Other Ambulatory Visit (HOSPITAL_COMMUNITY)
Admission: RE | Admit: 2019-08-25 | Discharge: 2019-08-25 | Disposition: A | Discharge status: Home / Self Care | Payer: Medicare Other | Source: Ambulatory Visit | Attending: Surgery | Admitting: Surgery

## 2019-08-25 DIAGNOSIS — Z20828 Contact with and (suspected) exposure to other viral communicable diseases: Secondary | ICD-10-CM | POA: Insufficient documentation

## 2019-08-27 LAB — NOVEL CORONAVIRUS, NAA (HOSP ORDER, SEND-OUT TO REF LAB; TAT 18-24 HRS): SARS-CoV-2, NAA: NOT DETECTED

## 2019-08-28 ENCOUNTER — Telehealth: Payer: Self-pay | Admitting: Family

## 2019-08-28 DIAGNOSIS — R918 Other nonspecific abnormal finding of lung field: Secondary | ICD-10-CM

## 2019-08-28 NOTE — Telephone Encounter (Signed)
Patient advised she will receive a call about scheduling a follow up ct scan

## 2019-08-28 NOTE — Telephone Encounter (Signed)
Please contact pt and let her know that I reviewed her record and see that she is due for a follow up CT scan of her chest to recheck some nodules which were seen over the summer when she was in the hospital.

## 2019-08-29 ENCOUNTER — Ambulatory Visit (HOSPITAL_COMMUNITY)
Admission: RE | Admit: 2019-08-29 | Discharge: 2019-08-29 | Disposition: A | Discharge status: Home / Self Care | Payer: Medicare Other | Attending: Surgery | Admitting: Surgery

## 2019-08-29 ENCOUNTER — Other Ambulatory Visit: Payer: Self-pay

## 2019-08-29 ENCOUNTER — Encounter (HOSPITAL_COMMUNITY)
Admission: RE | Disposition: A | Discharge status: Home / Self Care | Payer: Self-pay | Source: Home / Self Care | Attending: Surgery

## 2019-08-29 DIAGNOSIS — H269 Unspecified cataract: Secondary | ICD-10-CM | POA: Insufficient documentation

## 2019-08-29 DIAGNOSIS — I10 Essential (primary) hypertension: Secondary | ICD-10-CM | POA: Insufficient documentation

## 2019-08-29 DIAGNOSIS — M069 Rheumatoid arthritis, unspecified: Secondary | ICD-10-CM | POA: Insufficient documentation

## 2019-08-29 DIAGNOSIS — Z8249 Family history of ischemic heart disease and other diseases of the circulatory system: Secondary | ICD-10-CM | POA: Insufficient documentation

## 2019-08-29 DIAGNOSIS — Z8673 Personal history of transient ischemic attack (TIA), and cerebral infarction without residual deficits: Secondary | ICD-10-CM | POA: Insufficient documentation

## 2019-08-29 DIAGNOSIS — Z79899 Other long term (current) drug therapy: Secondary | ICD-10-CM | POA: Diagnosis not present

## 2019-08-29 DIAGNOSIS — I70211 Atherosclerosis of native arteries of extremities with intermittent claudication, right leg: Secondary | ICD-10-CM

## 2019-08-29 DIAGNOSIS — Z7902 Long term (current) use of antithrombotics/antiplatelets: Secondary | ICD-10-CM | POA: Insufficient documentation

## 2019-08-29 DIAGNOSIS — M810 Age-related osteoporosis without current pathological fracture: Secondary | ICD-10-CM | POA: Insufficient documentation

## 2019-08-29 DIAGNOSIS — Z79890 Hormone replacement therapy (postmenopausal): Secondary | ICD-10-CM | POA: Diagnosis not present

## 2019-08-29 DIAGNOSIS — E079 Disorder of thyroid, unspecified: Secondary | ICD-10-CM | POA: Diagnosis not present

## 2019-08-29 DIAGNOSIS — I745 Embolism and thrombosis of iliac artery: Secondary | ICD-10-CM | POA: Diagnosis not present

## 2019-08-29 DIAGNOSIS — F17210 Nicotine dependence, cigarettes, uncomplicated: Secondary | ICD-10-CM | POA: Insufficient documentation

## 2019-08-29 DIAGNOSIS — E785 Hyperlipidemia, unspecified: Secondary | ICD-10-CM | POA: Diagnosis not present

## 2019-08-29 DIAGNOSIS — Z888 Allergy status to other drugs, medicaments and biological substances status: Secondary | ICD-10-CM | POA: Insufficient documentation

## 2019-08-29 HISTORY — PX: LOWER EXTREMITY ANGIOGRAPHY: CATH118251

## 2019-08-29 HISTORY — PX: PERIPHERAL VASCULAR INTERVENTION: CATH118257

## 2019-08-29 LAB — POCT ACTIVATED CLOTTING TIME
Activated Clotting Time: 208 seconds
Activated Clotting Time: 202 seconds
Activated Clotting Time: 175 seconds
Activated Clotting Time: 191 seconds

## 2019-08-29 LAB — POCT I-STAT, CHEM 8
BUN: 8 mg/dL (ref 8–23)
Calcium, Ion: 1.12 mmol/L — ABNORMAL LOW (ref 1.15–1.40)
Chloride: 106 mmol/L (ref 98–111)
Creatinine, Ser: 0.6 mg/dL (ref 0.44–1.00)
Glucose, Bld: 109 mg/dL — ABNORMAL HIGH (ref 70–99)
HCT: 35 % — ABNORMAL LOW (ref 36.0–46.0)
Hemoglobin: 11.9 g/dL — ABNORMAL LOW (ref 12.0–15.0)
Potassium: 4.4 mmol/L (ref 3.5–5.1)
Sodium: 140 mmol/L (ref 135–145)
TCO2: 25 mmol/L (ref 22–32)

## 2019-08-29 SURGERY — LOWER EXTREMITY ANGIOGRAPHY
Anesthesia: LOCAL | Laterality: Right

## 2019-08-29 MED ORDER — HEPARIN SODIUM (PORCINE) 1000 UNIT/ML IJ SOLN
INTRAMUSCULAR | Status: AC
  Filled 2019-08-29: qty 1

## 2019-08-29 MED ORDER — SODIUM CHLORIDE 0.9 % IV SOLN: 250.0000 mL | INTRAVENOUS | Status: DC | PRN

## 2019-08-29 MED ORDER — HYDRALAZINE HCL 20 MG/ML IJ SOLN: 5.0000 mg | INTRAMUSCULAR | Status: DC | PRN

## 2019-08-29 MED ORDER — SODIUM CHLORIDE 0.9 % IV SOLN
INTRAVENOUS | Status: DC
  Administered 2019-08-29: 07:00:00 via INTRAVENOUS

## 2019-08-29 MED ORDER — ONDANSETRON HCL 4 MG/2ML IJ SOLN: 4.0000 mg | Freq: Four times a day (QID) | INTRAMUSCULAR | Status: DC | PRN

## 2019-08-29 MED ORDER — FENTANYL CITRATE (PF) 100 MCG/2ML IJ SOLN
INTRAMUSCULAR | Status: DC | PRN
  Administered 2019-08-29: 50 ug via INTRAVENOUS

## 2019-08-29 MED ORDER — LIDOCAINE HCL (PF) 1 % IJ SOLN
INTRAMUSCULAR | Status: DC | PRN
  Administered 2019-08-29 (×2): 15 mL via INTRADERMAL

## 2019-08-29 MED ORDER — SODIUM CHLORIDE 0.9 % WEIGHT BASED INFUSION: 1.0000 mL/kg/h | INTRAVENOUS | Status: DC

## 2019-08-29 MED ORDER — ACETAMINOPHEN 325 MG PO TABS: 650.0000 mg | ORAL_TABLET | ORAL | Status: DC | PRN

## 2019-08-29 MED ORDER — SODIUM CHLORIDE 0.9% FLUSH: 3.0000 mL | INTRAVENOUS | Status: DC | PRN

## 2019-08-29 MED ORDER — LIDOCAINE HCL (PF) 1 % IJ SOLN
INTRAMUSCULAR | Status: AC
  Filled 2019-08-29: qty 30

## 2019-08-29 MED ORDER — FENTANYL CITRATE (PF) 100 MCG/2ML IJ SOLN
INTRAMUSCULAR | Status: AC
  Filled 2019-08-29: qty 2

## 2019-08-29 MED ORDER — MIDAZOLAM HCL 2 MG/2ML IJ SOLN
INTRAMUSCULAR | Status: AC
  Filled 2019-08-29: qty 2

## 2019-08-29 MED ORDER — OXYCODONE HCL 5 MG PO TABS: 5.0000 mg | ORAL_TABLET | ORAL | Status: DC | PRN

## 2019-08-29 MED ORDER — HYDRALAZINE HCL 20 MG/ML IJ SOLN
INTRAMUSCULAR | Status: AC
  Filled 2019-08-29: qty 1

## 2019-08-29 MED ORDER — SODIUM CHLORIDE 0.9% FLUSH: 3.0000 mL | Freq: Two times a day (BID) | INTRAVENOUS | Status: DC

## 2019-08-29 MED ORDER — HEPARIN SODIUM (PORCINE) 1000 UNIT/ML IJ SOLN
INTRAMUSCULAR | Status: DC | PRN
  Administered 2019-08-29: 5000 [IU] via INTRAVENOUS

## 2019-08-29 MED ORDER — HEPARIN (PORCINE) IN NACL 1000-0.9 UT/500ML-% IV SOLN
INTRAVENOUS | Status: DC | PRN
  Administered 2019-08-29 (×2): 500 mL

## 2019-08-29 MED ORDER — HYDRALAZINE HCL 20 MG/ML IJ SOLN
INTRAMUSCULAR | Status: DC | PRN
  Administered 2019-08-29: 10 mg via INTRAVENOUS

## 2019-08-29 MED ORDER — MORPHINE SULFATE (PF) 2 MG/ML IV SOLN: 2.0000 mg | INTRAVENOUS | Status: DC | PRN

## 2019-08-29 MED ORDER — HEPARIN (PORCINE) IN NACL 1000-0.9 UT/500ML-% IV SOLN
INTRAVENOUS | Status: AC
  Filled 2019-08-29: qty 1000

## 2019-08-29 MED ORDER — LABETALOL HCL 5 MG/ML IV SOLN: 10.0000 mg | INTRAVENOUS | Status: DC | PRN

## 2019-08-29 MED ORDER — MIDAZOLAM HCL 2 MG/2ML IJ SOLN
INTRAMUSCULAR | Status: DC | PRN
  Administered 2019-08-29: 1 mg via INTRAVENOUS

## 2019-08-29 SURGICAL SUPPLY — 13 items
CATH OMNI FLUSH 5F 65CM (CATHETERS) ×1 IMPLANT
CATH QUICKCROSS SUPP .035X90CM (MICROCATHETER) ×1 IMPLANT
DEVICE TORQUE H2O (MISCELLANEOUS) ×1 IMPLANT
GUIDEWIRE ANGLED .035X260CM (WIRE) ×1 IMPLANT
KIT MICROPUNCTURE NIT STIFF (SHEATH) ×1 IMPLANT
KIT PV (KITS) ×3 IMPLANT
SHEATH PINNACLE 5F 10CM (SHEATH) ×2 IMPLANT
SHEATH PROBE COVER 6X72 (BAG) ×2 IMPLANT
SHIELD RADPAD SCOOP 12X17 (MISCELLANEOUS) ×1 IMPLANT
SYR MEDRAD MARK 7 150ML (SYRINGE) ×3 IMPLANT
TRANSDUCER W/STOPCOCK (MISCELLANEOUS) ×3 IMPLANT
TRAY PV CATH (CUSTOM PROCEDURE TRAY) ×3 IMPLANT
WIRE BENTSON .035X145CM (WIRE) ×1 IMPLANT

## 2019-08-29 NOTE — Progress Notes (Signed)
Site area: rt groin fa sheath pulled by Eddie Dibbles. Left groin fa sheath pulled by Dalbert Batman Site Prior to Removal:  Level 0 Pressure Applied For: 20 minutes each side Manual:   yes Patient Status During Pull:  stable Post Pull Site:  Level  0 Post Pull Instructions Given:  yes Post Pull Pulses Present: bilateral dp dopplered Dressing Applied:  Gauze and tegaderm Bedrest begins @ [id number] Comments:

## 2019-08-29 NOTE — Interval H&P Note (Signed)
History and Physical Interval Note:  08/29/2019 7:31 AM  Nancy [name]  has presented today for surgery, with the diagnosis of pvd with claudication.  The various methods of treatment have been discussed with the patient and family. After consideration of risks, benefits and other options for treatment, the patient has consented to  Procedure(s): LOWER EXTREMITY ANGIOGRAPHY (N/A) as a surgical intervention.  The patient's history has been reviewed, patient examined, no change in status, stable for surgery.  I have reviewed the patient's chart and labs.  Questions were answered to the patient's satisfaction.     [name]

## 2019-08-29 NOTE — Discharge Instructions (Signed)

## 2019-08-29 NOTE — Op Note (Signed)
    Patient name: Nancy [name] MRN: [id number] DOB: 31-Aug-1941 Sex: female  08/29/2019 Pre-operative Diagnosis: Right leg claudication Post-operative diagnosis:  Same Surgeon:  [name] Procedure Performed:  1.  Ultrasound-guided access, right femoral artery  2.  Ultrasound-guided access, left femoral artery  3.  Abdominal aortogram  4.  Bilateral lower extremity runoff  5.  Failed angioplasty, right external iliac artery  6.  Conscious sedation (50minutes)    Indications: Patient has been having right leg claudication.  She has failed medical management comes in today for angiographic evaluation.  Procedure:  The patient was identified in the holding area and taken to room 8.  The patient was then placed supine on the table and prepped and draped in the usual sterile fashion.  A time out was called.  Conscious sedation was administered with the use of IV fentanyl and Versed under continuous physician and nurse monitoring.  Heart rate, blood pressure, and oxygen saturation were continuously monitored.  Total sedation time was 46minutes.  Ultrasound was used to evaluate the left common femoral artery.  It was patent .  A digital ultrasound image was acquired.  A micropuncture needle was used to access the left common femoral artery under ultrasound guidance.  An 018 wire was advanced without resistance and a micropuncture sheath was placed.  The 018 wire was removed and a benson wire was placed.  The micropuncture sheath was exchanged for a 5 french sheath.  An omniflush catheter was advanced over the wire to the level of L-1.  An abdominal angiogram was obtained.  Next the catheter was pulled out of aorta bifurcation bilateral was performed Findings:   Aortogram: No significant renal artery stenosis.  The infrarenal abdominal aorta is widely patent.  The left common and external iliac artery widely patent.  The right common and internal iliac artery are patent the external iliac artery is  occluded with reconstitution of the common femoral artery.  Right Lower Extremity: There is reconstitution of the right common femoral artery.  The superficial femoral profundofemoral artery are small in caliber widely patent.  Popliteal arteries widely patent with three-vessel runoff.  Left Lower Extremity: The common femoral profundofemoral and superficial femoral artery widely patent.  Popliteal arteries widely patent with three-vessel runoff.  Intervention: After the above images were acquired the decision made to proceed with intervention.  Ultrasound was used to evaluate the right common femoral artery.  1% lidocaine was used for local anesthesia.  The right common femoral artery was cannulated under ultrasound guidance the micropuncture needle.  An 018 wire was advanced not resistance and a micropuncture sheath was placed.  A Bentson wire was then inserted and a 5 French sheath was placed.  The patient was fully heparinized.  I used a guidewire and a quick cross catheter from both the left and right side to try to cross the lesion.  Unfortunately I was not able to gain reentry.  Ultimately aborted.  Patient was taken the holding room for sheath removal.  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  The patient will be brought back for consideration of aortobifemoral versus femoral-femoral bypass graft  #2  No significant outflow or runoff disease.    Theotis Burrow, M.D., [facility] [place] Office: [phone] Pager:  [phone]

## 2019-08-30 ENCOUNTER — Encounter (HOSPITAL_COMMUNITY): Payer: Self-pay | Admitting: Surgery

## 2019-09-04 NOTE — Addendum Note (Signed)
Addended by: Debbrah Alar on: 09/04/2019 12:54 PM   Modules accepted: Orders

## 2019-09-05 ENCOUNTER — Other Ambulatory Visit: Payer: Self-pay | Admitting: Family

## 2019-09-06 ENCOUNTER — Other Ambulatory Visit: Payer: Self-pay

## 2019-09-06 ENCOUNTER — Ambulatory Visit (HOSPITAL_BASED_OUTPATIENT_CLINIC_OR_DEPARTMENT_OTHER): Admission: RE | Admit: 2019-09-06 | Payer: Medicare Other | Source: Ambulatory Visit

## 2019-09-06 ENCOUNTER — Ambulatory Visit (HOSPITAL_BASED_OUTPATIENT_CLINIC_OR_DEPARTMENT_OTHER)
Admission: RE | Admit: 2019-09-06 | Discharge: 2019-09-06 | Disposition: A | Discharge status: Home / Self Care | Payer: Medicare Other | Source: Ambulatory Visit | Attending: Family | Admitting: Family

## 2019-09-06 DIAGNOSIS — R918 Other nonspecific abnormal finding of lung field: Secondary | ICD-10-CM

## 2019-09-07 ENCOUNTER — Telehealth: Payer: Self-pay | Admitting: Family

## 2019-09-07 DIAGNOSIS — R918 Other nonspecific abnormal finding of lung field: Secondary | ICD-10-CM

## 2019-09-07 NOTE — Telephone Encounter (Signed)
Please contact pt and let her know that her CT scan shows some new and enlarged nodules in her lungs.  I would like to refer her to pulmonology for further evaluation. Please let me know if she has not heard back from them about appointment in 1 week.

## 2019-09-07 NOTE — Telephone Encounter (Signed)
Results given to patient, advised of referral ant to call in 1 week if not scheduled with pulmonology yet.

## 2019-09-12 ENCOUNTER — Ambulatory Visit (INDEPENDENT_AMBULATORY_CARE_PROVIDER_SITE_OTHER): Payer: Medicare Other | Admitting: Emergency Medicine

## 2019-09-12 ENCOUNTER — Encounter: Payer: Self-pay | Admitting: Emergency Medicine

## 2019-09-12 ENCOUNTER — Other Ambulatory Visit: Payer: Self-pay

## 2019-09-12 DIAGNOSIS — R0600 Dyspnea, unspecified: Secondary | ICD-10-CM | POA: Insufficient documentation

## 2019-09-12 DIAGNOSIS — R0602 Shortness of breath: Secondary | ICD-10-CM | POA: Diagnosis not present

## 2019-09-12 DIAGNOSIS — F17200 Nicotine dependence, unspecified, uncomplicated: Secondary | ICD-10-CM | POA: Diagnosis not present

## 2019-09-12 DIAGNOSIS — R9389 Abnormal findings on diagnostic imaging of other specified body structures: Secondary | ICD-10-CM

## 2019-09-12 NOTE — Assessment & Plan Note (Signed)
Discussed cessation and its importance with her today.

## 2019-09-12 NOTE — Assessment & Plan Note (Signed)
She is not significantly bothered by her shortness of breath on a day-to-day basis.  All the same she is at some risk for COPD given her tobacco use and she does have subtle scar on CT chest.  She needs at least baseline PFT to assess for obstruction and restriction, allow [place] to evaluate for interval change going forward if she develops more shortness of breath.

## 2019-09-12 NOTE — Progress Notes (Signed)
Subjective:    [name], female    DOB: [birth year], 78 y.o.   MRN: [id number]  HPI Nancy [name] is a pleasant 79 year old smoker (10-15 pack years) with a history of RA on immunosuppressive therapy (formally methotrexate, off), CVA, depression, hypertension, allergic rhinitis.  She is referred today for evaluation of an abnormal CT scan of the chest.  CT chest 09/06/2019 reviewed by me, shows bilateral peripheral/subpleural reticulation and scar without any honeycomb change but with some occasional traction bronchiectatic change.  There are several bilateral pulmonary nodules the largest 10 mm located in the right upper lobe.  Appear to be new compared with 09/2015.   She denies any significant SOB - she may have had some anxiety, tachypnea recently at the time of the death of her son. She does not cough or have significant chest mucous. She does have some nasal congestion every day - uses flonase.    Review of Systems  Constitutional: Negative for activity change, appetite change, chills, diaphoresis, fatigue, fever and unexpected weight change.  HENT: Negative for congestion, dental problem, nosebleeds, postnasal drip, rhinorrhea, sinus pressure, sneezing, trouble swallowing and voice change.   Eyes: Negative for itching and visual disturbance.  Respiratory: Positive for shortness of breath. Negative for cough, choking, chest tightness, wheezing and stridor.   Cardiovascular: Negative for chest pain, palpitations and leg swelling.  Gastrointestinal: Negative for abdominal pain.  Musculoskeletal: Negative for joint swelling and myalgias.  Skin: Negative for rash.  Neurological: Negative for syncope, light-headedness and headaches.  Psychiatric/Behavioral: Negative for sleep disturbance. The patient is nervous/anxious.    Past Medical History:  Diagnosis Date  . Allergy   . Anemia    iron deficiency  . 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 Cataract   . Colon polyps   . CVA  (cerebral vascular accident) ([place]) 02/2017  . Depression   . Hyperlipidemia   . Hypertension   . Osteoporosis 01/01/2015  . Rheumatoid arteritis ([place])   . Thyroid disease   . TIA (transient ischemic attack) 2016  . Urinary incontinence      Family History  Problem Relation Age of Onset  . Hypertension Father   . Colon cancer Father   . Ulcerative colitis Mother   . Parkinson's disease Mother   . Heart disease Sister 73       CABG x 3  . Cirrhosis Brother   . Alcohol abuse Brother   . Colitis Daughter   . Colon polyps Daughter   . Heart attack Daughter   . Hypertension Son   . Kidney disease Son        transplant due to kidney problems after [facility]  . Arthritis Other   . Coronary artery disease Other   . Hyperlipidemia Other   . Hypertension Other   . Stroke Sister        died 16  . Arthritis Sister   . Hypertension Sister   . Thyroid disease Daughter        x 3  . Cirrhosis Brother 26       ETOH abuse     Social History   Socioeconomic History  . Marital status: Divorced    Spouse name: Not on file  . Number of children: 8  . Years of education: Not on file  . Highest education level: Not on file  Occupational History    Employer: RETIRED    Comment: Retired from [employer]  .  Financial resource strain: Not on file  . Food insecurity    Worry: Not on file    Inability: Not on file  . Transportation needs    Medical: Not on file    Non-medical: Not on file  Tobacco Use  . Smoking status: Current Some Day Smoker    Packs/day: 0.50    Years: 32.00    Pack years: 16.00    Types: Cigarettes  . Smokeless tobacco: Never Used  . Tobacco comment: 6 or 7 per day  Substance and Sexual Activity  . Alcohol use: No    Alcohol/week: 0.0 standard drinks  . Drug use: No  . Sexual activity: Never  Lifestyle  . Physical activity    Days per week: Not on file    Minutes per session: Not on file  . Stress: Not on file  Relationships   . Social Herbalist on phone: Not on file    Gets together: Not on file    Attends religious service: Not on file    Active member of club or organization: Not on file    Attends meetings of clubs or organizations: Not on file    Relationship status: Not on file  . Intimate partner violence    Fear of current or ex partner: Not on file    Emotionally abused: Not on file    Physically abused: Not on file    Forced sexual activity: Not on file  Other Topics Concern  . Not on file  Social History Narrative  . Not on file     Allergies  Allergen Reactions  . Amlodipine Other (See Comments)    dizziness  . Iron Nausea And Vomiting    Can take infusions      Outpatient Medications Prior to Visit  Medication Sig Dispense Refill  . Adalimumab (HUMIRA PEN) 40 MG/0.4ML PNKT Inject 40 mg into the skin once a week. Saturday    . atorvastatin (LIPITOR) 80 MG tablet Take 1 tablet (80 mg total) by mouth daily. 90 tablet 1  . carvedilol (COREG) 25 MG tablet Take 1 tablet (25 mg total) by mouth 2 (two) times daily. 60 tablet 3  . clopidogrel (PLAVIX) 75 MG tablet TAKE 1 TABLET BY MOUTH EVERY DAY (Patient taking differently: Take 75 mg by mouth daily. PLAVIX - Should not be held prior to cath procedure unless specifically ordered) 90 tablet 1  . Docusate Calcium (STOOL SOFTENER PO) Take 1 tablet by mouth daily as needed (constipation).     . fluticasone (FLONASE) 50 MCG/ACT nasal spray Place 1 spray into both nostrils daily as needed for allergies or rhinitis.    Marland Kitchen gabapentin (NEURONTIN) 100 MG capsule Take 1 capsule (100 mg total) by mouth 3 (three) times daily. 90 capsule 3  . leflunomide (ARAVA) 10 MG tablet Take 10 mg by mouth daily.  3  . levothyroxine (SYNTHROID) 100 MCG tablet TAKE 1 TABLET BY MOUTH EVERY DAY 90 tablet 1  . loratadine (CLARITIN) 10 MG tablet Take 10 mg by mouth daily as needed for allergies.     Marland Kitchen losartan (COZAAR) 100 MG tablet TAKE 1 TABLET BY MOUTH EVERY DAY  (Patient taking differently: Take 100 mg by mouth daily. ) 90 tablet 1  . mycophenolate (CELLCEPT) 500 MG tablet Take 500 mg by mouth daily.    . prednisoLONE acetate (PRED FORTE) 1 % ophthalmic suspension Place 1 drop into both eyes daily.     . predniSONE (DELTASONE) 5  MG tablet Take 5 mg by mouth daily.    Marland Kitchen PROLIA 60 MG/ML SOSY injection Inject 60 mg as directed every 6 (six) months.    . rosuvastatin (CRESTOR) 10 MG tablet Take 1 tablet (10 mg total) by mouth at bedtime. (Patient not taking: Reported on 08/24/2019) 30 tablet 11   No facility-administered medications prior to visit.         Objective:   Physical Exam Vitals:   09/12/19 1057  BP: (!) 142/92  Pulse: 66  SpO2: 99%  Weight: 108 lb (49 kg)  Height: 5' 5.5" (1.664 m)   Gen: Pleasant, thin elderly woman, in no distress,  normal affect  ENT: No lesions,  mouth clear,  oropharynx clear, no postnasal drip  Neck: No JVD, no stridor  Lungs: No use of accessory muscles, subtle inspiratory crackles more on the left than on the right, no wheeze  Cardiovascular: RRR, heart sounds normal, no murmur or gallops, no peripheral edema  Musculoskeletal: Deformities of the proximal joints of both hands, the distal joints were spared  Neuro: alert, awake, non focal  Skin: Warm, no lesions or rash      Assessment & Plan:  Abnormal CT of the chest She has subpleural reticulation and some scar bilaterally, occasionally with some traction bronchiectasis but without overt honeycomb change.  On the most recent scan from 09/06/2019 there are some scattered pulmonary nodules, largest 10 mm.  I suspected both findings are related to her rheumatoid disease.  Minimal change in the scar over a 4-year interval comparing back to 09/2015. She is no longer on methotrexate which can contribute to pulmonary inflammation and scar, suspect that rheumatoid is the cause here.  She needs surveillance to ensure that there is no progression and that  the nodules do not change in size or appearance.  If so then given her tobacco history we will need to talk about a possible tissue diagnosis.  Dyspnea She is not significantly bothered by her shortness of breath on a day-to-day basis.  All the same she is at some risk for COPD given her tobacco use and she does have subtle scar on CT chest.  She needs at least baseline PFT to assess for obstruction and restriction, allow [place] to evaluate for interval change going forward if she develops more shortness of breath.  TOBACCO ABUSE Discussed cessation and its importance with her today.  Baltazar Apo, MD, PhD 09/12/2019, 11:35 AM [facility] [phone] or if no answer [phone]

## 2019-09-12 NOTE — Patient Instructions (Signed)
Please work hard on stopping smoking. We will plan to repeat your CT scan of the chest in 6 months. We will perform pulmonary function testing when we can fit this into your schedule. Follow-up in 6 months and we will review both your CT scan and your breathing tests together.

## 2019-09-12 NOTE — Assessment & Plan Note (Signed)
She has subpleural reticulation and some scar bilaterally, occasionally with some traction bronchiectasis but without overt honeycomb change.  On the most recent scan from 09/06/2019 there are some scattered pulmonary nodules, largest 10 mm.  I suspected both findings are related to her rheumatoid disease.  Minimal change in the scar over a 4-year interval comparing back to 09/2015. She is no longer on methotrexate which can contribute to pulmonary inflammation and scar, suspect that rheumatoid is the cause here.  She needs surveillance to ensure that there is no progression and that the nodules do not change in size or appearance.  If so then given her tobacco history we will need to talk about a possible tissue diagnosis.

## 2019-09-14 ENCOUNTER — Telehealth: Payer: Self-pay | Admitting: Family

## 2019-09-14 NOTE — Telephone Encounter (Signed)
Opened in error

## 2019-10-09 ENCOUNTER — Other Ambulatory Visit: Payer: Self-pay

## 2019-10-09 ENCOUNTER — Ambulatory Visit (INDEPENDENT_AMBULATORY_CARE_PROVIDER_SITE_OTHER): Payer: Medicare Other | Admitting: Surgery

## 2019-10-09 ENCOUNTER — Encounter: Payer: Self-pay | Admitting: Surgery

## 2019-10-09 VITALS — BP 158/80 | HR 72 | Temp 97.7°F | Resp 20 | Ht 65.5 in | Wt 109.0 lb

## 2019-10-09 DIAGNOSIS — I70211 Atherosclerosis of native arteries of extremities with intermittent claudication, right leg: Secondary | ICD-10-CM

## 2019-10-09 NOTE — Progress Notes (Signed)
[facility] [place]  Patient name: Nancy [name] MRN: [id number] DOB: Feb 14, 1941 Sex: female   REASON FOR VISIT:    Follow up  [place]:   Nancy [name] is a 78 y.o. female who I saw in June 2020 for leg pain.  At that time she found it difficult to specifically describe her symptoms however her right leg bothers her more than the left, and her symptoms appear to get worse with walking approximately 100 yards.  She described calf and foot pain.  Her ABI was 0.51 on the right and 1.0 on the left.  I started her on a statin at that time as well as cilostazol and an exercise program.   She did not tolerate the cilostazol because of diarrhea.  She states that she cannot do her daily activities because of her right leg pain and wishes to have something done.  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  She is back today for discussions of surgical intervention.  She now states that since her arteriogram her symptoms are much more tolerable.  She does not have open wounds.  The patient is medically managed for hypertension with an ARB. She also suffers from rheumatoid arthritis which is been stable.  She continues to take her statin.  PAST MEDICAL HISTORY:   Past Medical History:  Diagnosis Date  . Allergy   . Anemia    iron deficiency  . Cancer ([place]) 1995   vaginal  . Cataract   . Colon polyps   . CVA (cerebral vascular accident) ([place]) 02/2017  . Depression   . Hyperlipidemia   . Hypertension   . Osteoporosis 01/01/2015  . Rheumatoid arteritis ([place])   . Thyroid disease   . TIA (transient ischemic attack) 2016  . Urinary incontinence      FAMILY HISTORY:   Family History  Problem Relation Age of Onset  . Hypertension Father   . Colon cancer Father   . Ulcerative colitis Mother   . Parkinson's disease Mother   . Heart  disease Sister 42       CABG x 3  . Cirrhosis Brother   . Alcohol abuse Brother   . Colitis Daughter   . Colon polyps Daughter   . Heart attack Daughter   . Hypertension Son   . Kidney disease Son        transplant due to kidney problems after [facility]  . Arthritis Other   . Coronary artery disease Other   . Hyperlipidemia Other   . Hypertension Other   . Stroke Sister        died 42  . Arthritis Sister   . Hypertension Sister   . Thyroid disease Daughter        x 3  . Cirrhosis Brother 56       ETOH abuse    SOCIAL HISTORY:   Social History   Tobacco Use  . Smoking status: Current Some Day Smoker    Packs/day: 0.50    Years: 32.00    Pack years: 16.00    Types: Cigarettes  . Smokeless tobacco: Never Used  . Tobacco comment: 6 or 7 per day  Substance Use Topics  . Alcohol use: No    Alcohol/week: 0.0 standard drinks     ALLERGIES:   Allergies  Allergen Reactions  . Amlodipine Other (See Comments)  dizziness  . Iron Nausea And Vomiting    Can take infusions      CURRENT MEDICATIONS:   Current Outpatient Medications  Medication Sig Dispense Refill  . Adalimumab (HUMIRA PEN) 40 MG/0.4ML PNKT Inject 40 mg into the skin once a week. Saturday    . atorvastatin (LIPITOR) 80 MG tablet Take 1 tablet (80 mg total) by mouth daily. 90 tablet 1  . carvedilol (COREG) 25 MG tablet Take 1 tablet (25 mg total) by mouth 2 (two) times daily. 60 tablet 3  . clopidogrel (PLAVIX) 75 MG tablet TAKE 1 TABLET BY MOUTH EVERY DAY (Patient taking differently: Take 75 mg by mouth daily. PLAVIX - Should not be held prior to cath procedure unless specifically ordered) 90 tablet 1  . Docusate Calcium (STOOL SOFTENER PO) Take 1 tablet by mouth daily as needed (constipation).     . fluticasone (FLONASE) 50 MCG/ACT nasal spray Place 1 spray into both nostrils daily as needed for allergies or rhinitis.    Marland Kitchen gabapentin (NEURONTIN) 100 MG capsule Take 1 capsule (100 mg total) by mouth  3 (three) times daily. 90 capsule 3  . leflunomide (ARAVA) 10 MG tablet Take 10 mg by mouth daily.  3  . levothyroxine (SYNTHROID) 100 MCG tablet TAKE 1 TABLET BY MOUTH EVERY DAY 90 tablet 1  . loratadine (CLARITIN) 10 MG tablet Take 10 mg by mouth daily as needed for allergies.     Marland Kitchen losartan (COZAAR) 100 MG tablet TAKE 1 TABLET BY MOUTH EVERY DAY (Patient taking differently: Take 100 mg by mouth daily. ) 90 tablet 1  . mycophenolate (CELLCEPT) 500 MG tablet Take 500 mg by mouth daily.    . prednisoLONE acetate (PRED FORTE) 1 % ophthalmic suspension Place 1 drop into both eyes daily.     . predniSONE (DELTASONE) 5 MG tablet Take 5 mg by mouth daily.    Marland Kitchen PROLIA 60 MG/ML SOSY injection Inject 60 mg as directed every 6 (six) months.     No current facility-administered medications for this visit.     REVIEW OF SYSTEMS:   [X]  denotes positive finding, [ ]  denotes negative finding Cardiac  Comments:  Chest pain or chest pressure:    Shortness of breath upon exertion:    Short of breath when lying flat:    Irregular heart rhythm:        Vascular    Pain in calf, thigh, or hip brought on by ambulation:    Pain in feet at night that wakes you up from your sleep:     Blood clot in your veins:    Leg swelling:         Pulmonary    Oxygen at home:    Productive cough:     Wheezing:         Neurologic    Sudden weakness in arms or legs:     Sudden numbness in arms or legs:     Sudden onset of difficulty speaking or slurred speech:    Temporary loss of vision in one eye:     Problems with dizziness:         Gastrointestinal    Blood in stool:     Vomited blood:         Genitourinary    Burning when urinating:     Blood in urine:        Psychiatric    Major depression:         Hematologic  Bleeding problems:    Problems with blood clotting too easily:        Skin    Rashes or ulcers:        Constitutional    Fever or chills:      PHYSICAL EXAM:   Vitals:    10/09/19 0837  BP: (!) 158/80  Pulse: 72  Resp: 20  Temp: 97.7 F (36.5 C)  SpO2: 99%  Weight: 109 lb (49.4 kg)  Height: 5' 5.5" (1.664 m)    GENERAL: The patient is a well-nourished female, in no acute distress. The vital signs are documented above. CARDIAC: There is a regular rate and rhythm.  VASCULAR: Nonpalpable pedal or femoral pulses on the right PULMONARY: Non-labored respirations ABDOMEN: Soft and non-tender with normal pitched bowel sounds.  MUSCULOSKELETAL: There are no major deformities or cyanosis. NEUROLOGIC: No focal weakness or paresthesias are detected. SKIN: There are no ulcers or rashes noted. PSYCHIATRIC: The patient has a normal affect.  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  We were discussing the possibility of an aortobifemoral bypass graft or a left to right femoral-femoral crossover graft however the patient feels that since her arteriogram her symptoms have become a little more manageable.  We discussed the absolute indications for surgery.  She does not have any of those and so we will continue to treat her nonoperatively.  We went over the signs to monitor for for progression of her symptoms.  She will contact me if any of these occur otherwise she will follow-up in 1 year.    Leia Alf, MD, FACS [facility] [phone] Pager [phone]

## 2019-10-30 ENCOUNTER — Other Ambulatory Visit: Payer: Self-pay

## 2019-10-31 ENCOUNTER — Ambulatory Visit (INDEPENDENT_AMBULATORY_CARE_PROVIDER_SITE_OTHER): Payer: Medicare Other | Admitting: Family

## 2019-10-31 ENCOUNTER — Other Ambulatory Visit (INDEPENDENT_AMBULATORY_CARE_PROVIDER_SITE_OTHER): Payer: Medicare Other

## 2019-10-31 ENCOUNTER — Encounter: Payer: Self-pay | Admitting: Family

## 2019-10-31 ENCOUNTER — Other Ambulatory Visit: Payer: Self-pay

## 2019-10-31 VITALS — BP 144/54 | HR 72 | Temp 95.9°F | Resp 16 | Wt 109.0 lb

## 2019-10-31 DIAGNOSIS — E039 Hypothyroidism, unspecified: Secondary | ICD-10-CM | POA: Diagnosis not present

## 2019-10-31 DIAGNOSIS — E538 Deficiency of other specified B group vitamins: Secondary | ICD-10-CM | POA: Diagnosis not present

## 2019-10-31 DIAGNOSIS — I10 Essential (primary) hypertension: Secondary | ICD-10-CM | POA: Diagnosis not present

## 2019-10-31 DIAGNOSIS — M059 Rheumatoid arthritis with rheumatoid factor, unspecified: Secondary | ICD-10-CM | POA: Diagnosis not present

## 2019-10-31 DIAGNOSIS — R739 Hyperglycemia, unspecified: Secondary | ICD-10-CM

## 2019-10-31 DIAGNOSIS — Z8673 Personal history of transient ischemic attack (TIA), and cerebral infarction without residual deficits: Secondary | ICD-10-CM

## 2019-10-31 LAB — HEMOGLOBIN A1C: Hgb A1c MFr Bld: 5.7 % (ref 4.6–6.5)

## 2019-10-31 LAB — VITAMIN B12: Vitamin B-12: 101 pg/mL — ABNORMAL LOW (ref 211–911)

## 2019-10-31 LAB — BASIC METABOLIC PANEL
BUN: 11 mg/dL (ref 6–23)
CO2: 27 mEq/L (ref 19–32)
Calcium: 9.4 mg/dL (ref 8.4–10.5)
Chloride: 106 mEq/L (ref 96–112)
Creatinine, Ser: 0.68 mg/dL (ref 0.40–1.20)
GFR: 101.09 mL/min (ref >60.00)
Glucose, Bld: 66 mg/dL — ABNORMAL LOW (ref 70–99)
Potassium: 3.4 mEq/L — ABNORMAL LOW (ref 3.5–5.1)
Sodium: 140 mEq/L (ref 135–145)

## 2019-10-31 LAB — TSH: TSH: 0.35 u[IU]/mL (ref 0.35–4.50)

## 2019-10-31 MED ORDER — ATORVASTATIN CALCIUM 80 MG PO TABS: 80.0000 mg | ORAL_TABLET | Freq: Every day | ORAL | 1 refills | Status: DC

## 2019-10-31 NOTE — Patient Instructions (Signed)
Please restart lipitor.

## 2019-10-31 NOTE — Progress Notes (Signed)
Subjective:    [name], female    DOB: 1941/07/12, 78 y.o.   MRN: [id number]  HPI   Patient is a 78 yr old female who presents today for follow up.  HTN- Reports that her home readings have been elevated.  She is maintained on coreg 25mg  bid, losartan once daily.   BP Readings from Last 3 Encounters:  10/31/19 (!) 144/54  10/09/19 (!) 158/80  09/12/19 (!) 142/92   Hypothyroid- Reports that they discontinued her synthroid when she was discharged from [facility] back in October following a syncopal episode.  Lab Results  Component Value Date   TSH 1.017 06/06/2019   Hx of CVA- maintained on lipitor, plavix.  Hyperlipidemia- Not taking statin.  Lab Results  Component Value Date   CHOL 236 (H) 12/29/2018   HDL 69.50 12/29/2018   LDLCALC 149 (H) 12/29/2018   TRIG 89.0 12/29/2018   CHOLHDL 3 12/29/2018   Hypothyroid- she has not taken synthroid since the end of October.  Reports that she started taking vitamins and this has helped her energy.  Wt Readings from Last 3 Encounters:  10/31/19 109 lb (49.4 kg)  10/09/19 109 lb (49.4 kg)  09/12/19 108 lb (49 kg)    Lab Results  Component Value Date   TSH 1.017 06/06/2019    RA- managed by rheumtatology.  Lab Results  Component Value Date   HGBA1C 6.2 08/01/2019   Osteoporosis- maintained on prolia. Last shot on 06/06/19. Due on 12/07/19.    Review of Systems See HPI  Past Medical History:  Diagnosis Date  . Allergy   . Anemia    iron deficiency  . 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 Cataract   . Colon polyps   . CVA (cerebral vascular accident) ([place]) 02/2017  . Depression   . Hyperlipidemia   . Hypertension   . Osteoporosis 01/01/2015  . Rheumatoid arteritis ([place])   . Thyroid disease   . TIA (transient ischemic attack) 2016  . Urinary incontinence      Social History   Socioeconomic History  . Marital status: Divorced    Spouse name: Not on file  . Number of children: 8  . Years of  education: Not on file  . Highest education level: Not on file  Occupational History    Employer: RETIRED    Comment: Retired from [employer]  . Smoking status: Current Some Day Smoker    Packs/day: 0.50    Years: 32.00    Pack years: 16.00    Types: Cigarettes  . Smokeless tobacco: Never Used  . Tobacco comment: 6 or 7 per day  Substance and Sexual Activity  . Alcohol use: No    Alcohol/week: 0.0 standard drinks  . Drug use: No  . Sexual activity: Never  Other Topics Concern  . Not on file  Social History Narrative  . Not on file   Social Determinants of Health   Financial Resource Strain:   . Difficulty of Paying Living Expenses: Not on file  Food Insecurity:   . Worried About Charity fundraiser in the Last Year: Not on file  . Ran Out of Food in the Last Year: Not on file  Transportation Needs:   . Lack of Transportation (Medical): Not on file  . Lack of Transportation (Non-Medical): Not on file  Physical Activity:   . Days of Exercise per Week: Not on file  . Minutes of Exercise  per Session: Not on file  Stress:   . Feeling of Stress : Not on file  Social Connections:   . Frequency of Communication with Friends and Family: Not on file  . Frequency of Social Gatherings with Friends and Family: Not on file  . Attends Religious Services: Not on file  . Active Member of Clubs or Organizations: Not on file  . Attends Archivist Meetings: Not on file  . Marital Status: Not on file  Intimate Partner Violence:   . Fear of Current or Ex-Partner: Not on file  . Emotionally Abused: Not on file  . Physically Abused: Not on file  . Sexually Abused: Not on file    Past Surgical History:  Procedure Laterality Date  . ABDOMINAL HYSTERECTOMY  1976  . CATARACT EXTRACTION Bilateral   . LOWER EXTREMITY ANGIOGRAPHY N/A 08/29/2019   Procedure: LOWER EXTREMITY ANGIOGRAPHY;  Surgeon: Serafina Mitchell, MD;  Location: [facility];  Service:  Cardiovascular;  Laterality: N/A;  . PERIPHERAL VASCULAR INTERVENTION Right 08/29/2019   Procedure: PERIPHERAL VASCULAR INTERVENTION;  Surgeon: Serafina Mitchell, MD;  Location: [facility];  Service: Cardiovascular;  Laterality: Right;  . 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 Hypertension Father   . Colon cancer Father   . Ulcerative colitis Mother   . Parkinson's disease Mother   . Heart disease Sister 63       CABG x 3  . Cirrhosis Brother   . Alcohol abuse Brother   . Colitis Daughter   . Colon polyps Daughter   . Heart attack Daughter   . Hypertension Son   . Kidney disease Son        transplant due to kidney problems after [facility]  . Arthritis Other   . Coronary artery disease Other   . Hyperlipidemia Other   . Hypertension Other   . Stroke Sister        died 46  . Arthritis Sister   . Hypertension Sister   . Thyroid disease Daughter        x 3  . Cirrhosis Brother 65       ETOH abuse    Allergies  Allergen Reactions  . Amlodipine Other (See Comments)    dizziness  . Iron Nausea And Vomiting    Can take infusions     Current Outpatient Medications on File Prior to Visit  Medication Sig Dispense Refill  . Adalimumab (HUMIRA PEN) 40 MG/0.4ML PNKT Inject 40 mg into the skin once a week. Saturday    . carvedilol (COREG) 25 MG tablet Take 1 tablet (25 mg total) by mouth 2 (two) times daily. 60 tablet 3  . clopidogrel (PLAVIX) 75 MG tablet TAKE 1 TABLET BY MOUTH EVERY DAY (Patient taking differently: Take 75 mg by mouth daily. PLAVIX - Should not be held prior to cath procedure unless specifically ordered) 90 tablet 1  . Docusate Calcium (STOOL SOFTENER PO) Take 1 tablet by mouth daily as needed (constipation).     . fluticasone (FLONASE) 50 MCG/ACT nasal spray Place 1 spray into both nostrils daily as needed for allergies or rhinitis.    Marland Kitchen gabapentin (NEURONTIN) 100 MG capsule Take 1 capsule  (100 mg total) by mouth 3 (three) times daily. 90 capsule 3  . leflunomide (ARAVA) 10 MG tablet Take 10 mg by mouth daily.  3  . levothyroxine (  SYNTHROID) 100 MCG tablet TAKE 1 TABLET BY MOUTH EVERY DAY 90 tablet 1  . loratadine (CLARITIN) 10 MG tablet Take 10 mg by mouth daily as needed for allergies.     Marland Kitchen losartan (COZAAR) 100 MG tablet TAKE 1 TABLET BY MOUTH EVERY DAY (Patient taking differently: Take 100 mg by mouth daily. ) 90 tablet 1  . mycophenolate (CELLCEPT) 500 MG tablet Take 500 mg by mouth daily.    . prednisoLONE acetate (PRED FORTE) 1 % ophthalmic suspension Place 1 drop into both eyes daily.     . predniSONE (DELTASONE) 5 MG tablet Take 5 mg by mouth daily.    Marland Kitchen PROLIA 60 MG/ML SOSY injection Inject 60 mg as directed every 6 (six) months.     No current facility-administered medications on file prior to visit.    BP (!) 144/54   Pulse 72   Temp (!) 95.9 F (35.5 C) (Temporal)   Resp 16   Wt 109 lb (49.4 kg)   SpO2 100%   BMI 17.86 kg/m       Objective:   Physical Exam Constitutional:      Appearance: She is well-developed.  Neck:     Thyroid: No thyromegaly.  Cardiovascular:     Rate and Rhythm: Normal rate and regular rhythm.     Heart sounds: Normal heart sounds. No murmur.  Pulmonary:     Effort: Pulmonary effort is normal. No respiratory distress.     Breath sounds: Normal breath sounds. No wheezing.  Musculoskeletal:        General: Deformity (finger joint deformity) present.     Cervical back: Neck supple.  Skin:    General: Skin is warm and dry.  Neurological:     Mental Status: She is alert and oriented to person, place, and time.  Psychiatric:        Behavior: Behavior normal.        Thought Content: Thought content normal.        Judgment: Judgment normal.           Assessment & Plan:  HTN-  BP is acceptable. Continue current meds.  Hyperlipidemia- advised pt to start atorvastatin.  Hx of CVA- continues plavix.  Hypothyroid-  check TSH- suspect TSH will be elevated and if so plan to restart Synthroid. I am not sure why this was discontinued.  Hyperlipidemia- not taking statin.  Advised pt to start atorvastatin.   RA- stable, management per rheumatology (Dr. Trudie Reed)  This visit occurred during the SARS-CoV-2 public health emergency.  Safety protocols were in place, including screening questions prior to the visit, additional usage of staff PPE, and extensive cleaning of exam room while observing appropriate contact time as indicated for disinfecting solutions.

## 2019-11-01 ENCOUNTER — Telehealth: Payer: Self-pay | Admitting: Family

## 2019-11-01 NOTE — Telephone Encounter (Signed)
b12 level is low. I would recommend that she start b12 1052mcg IM weekly x 4 weeks with nurse then monthly.

## 2019-11-02 NOTE — Telephone Encounter (Signed)
Patient advised of results and provider's advise. She was scheduled for an initial injection on 11-07-19, when she comes in that day she will set up the other appointments.

## 2019-11-07 ENCOUNTER — Other Ambulatory Visit: Payer: Self-pay

## 2019-11-07 ENCOUNTER — Ambulatory Visit (INDEPENDENT_AMBULATORY_CARE_PROVIDER_SITE_OTHER): Payer: Medicare Other

## 2019-11-07 DIAGNOSIS — E538 Deficiency of other specified B group vitamins: Secondary | ICD-10-CM | POA: Diagnosis not present

## 2019-11-07 MED ORDER — CYANOCOBALAMIN 1000 MCG/ML IJ SOLN
1000.0000 ug | Freq: Once | INTRAMUSCULAR | Status: AC
  Administered 2019-11-07: 1000 ug via INTRAMUSCULAR

## 2019-11-07 NOTE — Progress Notes (Signed)
Bowman, DO

## 2019-11-07 NOTE — Progress Notes (Signed)
Pt here for weekly B12 injection per PCP.   B12 1041mcg given IM, and pt tolerated injection well.  Next B12 injection scheduled for 11/15/2019

## 2019-11-15 ENCOUNTER — Ambulatory Visit (INDEPENDENT_AMBULATORY_CARE_PROVIDER_SITE_OTHER): Payer: Medicare Other

## 2019-11-15 ENCOUNTER — Other Ambulatory Visit: Payer: Self-pay

## 2019-11-15 DIAGNOSIS — I10 Essential (primary) hypertension: Secondary | ICD-10-CM

## 2019-11-15 DIAGNOSIS — E538 Deficiency of other specified B group vitamins: Secondary | ICD-10-CM | POA: Diagnosis not present

## 2019-11-15 MED ORDER — CYANOCOBALAMIN 1000 MCG/ML IJ SOLN
1000.0000 ug | INTRAMUSCULAR | Status: DC
  Administered 2019-11-15: 1000 ug via INTRAMUSCULAR

## 2019-11-15 NOTE — Addendum Note (Signed)
Addended by: Sanda Linger on: 11/15/2019 10:20 AM   Modules accepted: Level of Service

## 2019-11-15 NOTE — Progress Notes (Addendum)
Pt here for weekly B12 injection per PCP.   B12 1048mcg given right IM, and pt tolerated injection well.  Next B12 injection scheduled for   Pt was only scheduled for the B12 shot and asked to have her BP checked. Pt states no SOB, headache, chest pain, or dizziness. Pt offered a appointment with [facility] at 11:00 am and refused. I advised patient if she starts experiencing sxs to call us or go to the ED.   Pt here for Blood pressure check per   Pt currently takes:  carvedilol (COREG) 25 MG tablet  Take 1 tablet (25 mg total) by mouth 2 (two) times daily.  Pt reports compliance with medication.  BP today @ = 160/80  Please advise  Addendum:  Currently on coreg 25mg  bid, losartan 100mg  daily.  Due to recent bp at goal on 12/22 and hx of syncope- will not adjust medication at this time. Monitor.  BP Readings from Last 3 Encounters:  10/31/19 (!) 144/54  10/09/19 (!) 158/80  09/12/19 (!) 142/92

## 2019-11-22 ENCOUNTER — Other Ambulatory Visit: Payer: Self-pay

## 2019-11-22 ENCOUNTER — Ambulatory Visit (INDEPENDENT_AMBULATORY_CARE_PROVIDER_SITE_OTHER): Payer: Medicare Other | Admitting: *Deleted

## 2019-11-22 DIAGNOSIS — E538 Deficiency of other specified B group vitamins: Secondary | ICD-10-CM | POA: Diagnosis not present

## 2019-11-22 MED ORDER — CYANOCOBALAMIN 1000 MCG/ML IJ SOLN
1000.0000 ug | Freq: Once | INTRAMUSCULAR | Status: AC
  Administered 2019-11-22: 1000 ug via INTRAMUSCULAR

## 2019-11-22 MED ORDER — CYANOCOBALAMIN 1000 MCG/ML IJ SOLN: 1000.0000 ug | Freq: Once | INTRAMUSCULAR | Status: DC

## 2019-11-22 NOTE — Progress Notes (Signed)
Pt here for weekly B12 injection per PCP.   B12 1064mcg given IM, and pt tolerated injection well.  Next B12 injection scheduled for 11/29/19

## 2019-11-29 ENCOUNTER — Ambulatory Visit (INDEPENDENT_AMBULATORY_CARE_PROVIDER_SITE_OTHER): Payer: Medicare Other | Admitting: *Deleted

## 2019-11-29 ENCOUNTER — Other Ambulatory Visit: Payer: Self-pay

## 2019-11-29 DIAGNOSIS — E538 Deficiency of other specified B group vitamins: Secondary | ICD-10-CM | POA: Diagnosis not present

## 2019-11-29 MED ORDER — CYANOCOBALAMIN 1000 MCG/ML IJ SOLN: 1000.0000 ug | Freq: Once | INTRAMUSCULAR | 0 refills | Status: DC

## 2019-11-29 MED ORDER — CYANOCOBALAMIN 1000 MCG/ML IJ SOLN
1000.0000 ug | Freq: Once | INTRAMUSCULAR | Status: AC
  Administered 2019-11-29: 1000 ug via INTRAMUSCULAR

## 2019-11-29 NOTE — Progress Notes (Signed)
Pt here forweeklyB12 injection per PCP.  B12 1042mcg given IM, and pt tolerated injection well.  Next B12 injection scheduled for2/25/21

## 2020-01-04 ENCOUNTER — Ambulatory Visit: Payer: Medicare Other

## 2020-01-05 ENCOUNTER — Ambulatory Visit (INDEPENDENT_AMBULATORY_CARE_PROVIDER_SITE_OTHER): Payer: Medicare Other

## 2020-01-05 ENCOUNTER — Other Ambulatory Visit: Payer: Self-pay

## 2020-01-05 DIAGNOSIS — E538 Deficiency of other specified B group vitamins: Secondary | ICD-10-CM | POA: Diagnosis not present

## 2020-01-05 MED ORDER — CYANOCOBALAMIN 1000 MCG/ML IJ SOLN
1000.0000 ug | INTRAMUSCULAR | Status: DC
  Administered 2020-01-05 – 2021-09-03 (×3): 1000 ug via INTRAMUSCULAR

## 2020-01-05 NOTE — Progress Notes (Addendum)
Pt here forweeklyB12 injection per PCP.  B12 109mcg given IM, and pt tolerated injection well  Pt has appointment with Melissa on 02/06/20 and will get next B12 shot then.

## 2020-01-15 ENCOUNTER — Other Ambulatory Visit: Payer: Self-pay | Admitting: Family

## 2020-01-18 ENCOUNTER — Other Ambulatory Visit: Payer: Self-pay | Admitting: Family

## 2020-02-05 ENCOUNTER — Other Ambulatory Visit: Payer: Self-pay

## 2020-02-06 ENCOUNTER — Encounter: Payer: Self-pay | Admitting: Family

## 2020-02-06 ENCOUNTER — Other Ambulatory Visit: Payer: Self-pay

## 2020-02-06 ENCOUNTER — Ambulatory Visit (INDEPENDENT_AMBULATORY_CARE_PROVIDER_SITE_OTHER): Payer: Medicare Other | Admitting: Family

## 2020-02-06 VITALS — BP 168/68 | HR 68 | Temp 97.5°F | Resp 16 | Wt 112.0 lb

## 2020-02-06 DIAGNOSIS — H30033 Focal chorioretinal inflammation, peripheral, bilateral: Secondary | ICD-10-CM | POA: Diagnosis not present

## 2020-02-06 DIAGNOSIS — R739 Hyperglycemia, unspecified: Secondary | ICD-10-CM

## 2020-02-06 DIAGNOSIS — E538 Deficiency of other specified B group vitamins: Secondary | ICD-10-CM | POA: Diagnosis not present

## 2020-02-06 DIAGNOSIS — E039 Hypothyroidism, unspecified: Secondary | ICD-10-CM | POA: Diagnosis not present

## 2020-02-06 DIAGNOSIS — H35033 Hypertensive retinopathy, bilateral: Secondary | ICD-10-CM | POA: Diagnosis not present

## 2020-02-06 DIAGNOSIS — I10 Essential (primary) hypertension: Secondary | ICD-10-CM

## 2020-02-06 DIAGNOSIS — M059 Rheumatoid arthritis with rheumatoid factor, unspecified: Secondary | ICD-10-CM | POA: Diagnosis not present

## 2020-02-06 DIAGNOSIS — Z961 Presence of intraocular lens: Secondary | ICD-10-CM | POA: Diagnosis not present

## 2020-02-06 DIAGNOSIS — R634 Abnormal weight loss: Secondary | ICD-10-CM

## 2020-02-06 DIAGNOSIS — M05741 Rheumatoid arthritis with rheumatoid factor of right hand without organ or systems involvement: Secondary | ICD-10-CM | POA: Diagnosis not present

## 2020-02-06 DIAGNOSIS — M05742 Rheumatoid arthritis with rheumatoid factor of left hand without organ or systems involvement: Secondary | ICD-10-CM | POA: Diagnosis not present

## 2020-02-06 LAB — BASIC METABOLIC PANEL
BUN: 15 mg/dL (ref 6–23)
CO2: 30 mEq/L (ref 19–32)
Calcium: 9.9 mg/dL (ref 8.4–10.5)
Chloride: 103 mEq/L (ref 96–112)
Creatinine, Ser: 0.71 mg/dL (ref 0.40–1.20)
GFR: 96.11 mL/min (ref >60.00)
Glucose, Bld: 113 mg/dL — ABNORMAL HIGH (ref 70–99)
Potassium: 4.9 mEq/L (ref 3.5–5.1)
Sodium: 138 mEq/L (ref 135–145)

## 2020-02-06 LAB — HEMOGLOBIN A1C: Hgb A1c MFr Bld: 6 % (ref 4.6–6.5)

## 2020-02-06 LAB — TSH: TSH: 1.1 u[IU]/mL (ref 0.35–4.50)

## 2020-02-06 MED ORDER — HYDRALAZINE HCL 25 MG PO TABS: 25.0000 mg | ORAL_TABLET | Freq: Three times a day (TID) | ORAL | 3 refills | Status: DC

## 2020-02-06 MED ORDER — CYANOCOBALAMIN 1000 MCG/ML IJ SOLN
1000.0000 ug | Freq: Once | INTRAMUSCULAR | Status: AC
  Administered 2020-02-06: 1000 ug via INTRAMUSCULAR

## 2020-02-06 NOTE — Progress Notes (Signed)
Subjective:    [name], female    DOB: 28-Jul-1941, 79 y.o.   MRN: [id number]  HPI  Patient is a 79 yr old female who presents today for follow up.  Reports that her weight got down as low as 104. Has been eating soft food due to dental extraction. She is being fitted for new dentures.  Wt Readings from Last 3 Encounters:  02/06/20 112 lb (50.8 kg)  10/31/19 109 lb (49.4 kg)  10/09/19 109 lb (49.4 kg)     HTN- Reports high bp readings at home.  Carvedilol 25mg  bid Losartan 100mg  once daily   BP Readings from Last 3 Encounters:  02/06/20 (!) 168/68  10/31/19 (!) 144/54  10/09/19 (!) 158/80   Hypothyroid- maintained on synthroid 100 mcg.   Lab Results  Component Value Date   TSH 0.35 10/31/2019   Hyperglycemia-  Lab Results  Component Value Date   HGBA1C 5.7 10/31/2019   Rheumatoid Arthritis- maintained on cellcept/arava. Reports that she continues to follow with Dr. Trudie Reed (rheumatology).  Hyperlipidemia-  Lab Results  Component Value Date   CHOL 236 (H) 12/29/2018   HDL 69.50 12/29/2018   LDLCALC 149 (H) 12/29/2018   TRIG 89.0 12/29/2018   CHOLHDL 3 12/29/2018     Review of Systems See HPI  Past Medical History:  Diagnosis Date  . Allergy   . Anemia    iron deficiency  . Cancer ([place]) 1995   vaginal  . Cataract   . Colon polyps   . CVA (cerebral vascular accident) ([place]) 02/2017  . Depression   . Hyperlipidemia   . Hypertension   . Osteoporosis 01/01/2015  . Rheumatoid arteritis ([place])   . Thyroid disease   . TIA (transient ischemic attack) 2016  . Urinary incontinence      Social History   Socioeconomic History  . Marital status: Divorced    Spouse name: Not on file  . Number of children: 8  . Years of education: Not on file  . Highest education level: Not on file  Occupational History    Employer: RETIRED    Comment: Retired from [employer]  . Smoking status: Current Some Day Smoker   Packs/day: 0.50    Years: 32.00    Pack years: 16.00    Types: Cigarettes  . Smokeless tobacco: Never Used  . Tobacco comment: 6 or 7 per day  Substance and Sexual Activity  . Alcohol use: No    Alcohol/week: 0.0 standard drinks  . Drug use: No  . Sexual activity: Never  Other Topics Concern  . Not on file  Social History Narrative  . Not on file   Social Determinants of Health   Financial Resource Strain:   . Difficulty of Paying Living Expenses:   Food Insecurity:   . Worried About Charity fundraiser in the Last Year:   . Arboriculturist in the Last Year:   Transportation Needs:   . Film/video editor (Medical):   Marland Kitchen Lack of Transportation (Non-Medical):   Physical Activity:   . Days of Exercise per Week:   . Minutes of Exercise per Session:   Stress:   . Feeling of Stress :   Social Connections:   . Frequency of Communication with Friends and Family:   . Frequency of Social Gatherings with Friends and Family:   . Attends Religious Services:   . Active Member of Clubs or Organizations:   .  Attends Archivist Meetings:   Marland Kitchen Marital Status:   Intimate Partner Violence:   . Fear of Current or Ex-Partner:   . Emotionally Abused:   Marland Kitchen Physically Abused:   . Sexually Abused:     Past Surgical History:  Procedure Laterality Date  . ABDOMINAL HYSTERECTOMY  1976  . CATARACT EXTRACTION Bilateral   . LOWER EXTREMITY ANGIOGRAPHY N/A 08/29/2019   Procedure: LOWER EXTREMITY ANGIOGRAPHY;  Surgeon: Serafina Mitchell, MD;  Location: [facility];  Service: Cardiovascular;  Laterality: N/A;  . PERIPHERAL VASCULAR INTERVENTION Right 08/29/2019   Procedure: PERIPHERAL VASCULAR INTERVENTION;  Surgeon: Serafina Mitchell, MD;  Location: [facility];  Service: Cardiovascular;  Laterality: Right;  . 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 Hypertension Father   . Colon cancer Father   . Ulcerative  colitis Mother   . Parkinson's disease Mother   . Heart disease Sister 42       CABG x 3  . Cirrhosis Brother   . Alcohol abuse Brother   . Colitis Daughter   . Colon polyps Daughter   . Heart attack Daughter   . Hypertension Son   . Kidney disease Son        transplant due to kidney problems after [facility]  . Arthritis Other   . Coronary artery disease Other   . Hyperlipidemia Other   . Hypertension Other   . Stroke Sister        died 56  . Arthritis Sister   . Hypertension Sister   . Thyroid disease Daughter        x 3  . Cirrhosis Brother 65       ETOH abuse    Allergies  Allergen Reactions  . Amlodipine Other (See Comments)    dizziness  . Iron Nausea And Vomiting    Can take infusions     Current Outpatient Medications on File Prior to Visit  Medication Sig Dispense Refill  . Adalimumab (HUMIRA PEN) 40 MG/0.4ML PNKT Inject 40 mg into the skin once a week. Saturday    . atorvastatin (LIPITOR) 80 MG tablet Take 1 tablet (80 mg total) by mouth daily. 90 tablet 1  . carvedilol (COREG) 25 MG tablet TAKE 1 TABLET BY MOUTH TWICE A DAY 180 tablet 1  . clopidogrel (PLAVIX) 75 MG tablet TAKE 1 TABLET BY MOUTH EVERY DAY 90 tablet 1  . Docusate Calcium (STOOL SOFTENER PO) Take 1 tablet by mouth daily as needed (constipation).     . fluticasone (FLONASE) 50 MCG/ACT nasal spray Place 1 spray into both nostrils daily as needed for allergies or rhinitis.    Marland Kitchen gabapentin (NEURONTIN) 100 MG capsule Take 1 capsule (100 mg total) by mouth 3 (three) times daily. 90 capsule 3  . leflunomide (ARAVA) 10 MG tablet Take 10 mg by mouth daily.  3  . levothyroxine (SYNTHROID) 100 MCG tablet TAKE 1 TABLET BY MOUTH EVERY DAY 90 tablet 1  . loratadine (CLARITIN) 10 MG tablet Take 10 mg by mouth daily as needed for allergies.     Marland Kitchen losartan (COZAAR) 100 MG tablet TAKE 1 TABLET BY MOUTH EVERY DAY 90 tablet 1  . mycophenolate (CELLCEPT) 500 MG tablet Take 500 mg by mouth daily.    .  prednisoLONE acetate (PRED FORTE) 1 % ophthalmic suspension Place 1 drop into both eyes daily.     Marland Kitchen  predniSONE (DELTASONE) 5 MG tablet Take 5 mg by mouth daily.    Marland Kitchen PROLIA 60 MG/ML SOSY injection Inject 60 mg as directed every 6 (six) months.     Current Facility-Administered Medications on File Prior to Visit  Medication Dose Route Frequency Provider Last Rate Last Admin  . cyanocobalamin ((VITAMIN B-12)) injection 1,000 mcg  1,000 mcg Intramuscular Q30 days Debbrah Alar, NP   1,000 mcg at 01/05/20 1109    BP (!) 168/68 (BP Location: Right Arm, Patient Position: Sitting, Cuff Size: Small)   Pulse 68   Temp (!) 97.5 F (36.4 C) (Temporal)   Resp 16   Wt 112 lb (50.8 kg)   SpO2 100%   BMI 18.35 kg/m       Objective:   Physical Exam Constitutional:      Appearance: She is well-developed.  Neck:     Thyroid: No thyromegaly.  Cardiovascular:     Rate and Rhythm: Normal rate and regular rhythm.     Heart sounds: Normal heart sounds. No murmur.  Pulmonary:     Effort: Pulmonary effort is normal. No respiratory distress.     Breath sounds: Normal breath sounds. No wheezing.  Musculoskeletal:     Cervical back: Neck supple.  Skin:    General: Skin is warm and dry.  Neurological:     Mental Status: She is alert and oriented to person, place, and time.  Psychiatric:        Behavior: Behavior normal.        Thought Content: Thought content normal.        Judgment: Judgment normal.           Assessment & Plan:  Weight loss- improving.  Hopefully she will be able to expand her diet once she obtains her dentures.  HTN- uncontrolled. Will add hydralazine 25mg  tid.  b12 deficiency- continue b12 injection monthly. Given today.  RA- stable.  Management per rheumatology.  Hypothyroid- clinically stable. Obtain follow up TSH. Continue synthroid.  This visit occurred during the SARS-CoV-2 public health emergency.  Safety protocols were in place, including screening  questions prior to the visit, additional usage of staff PPE, and extensive cleaning of exam room while observing appropriate contact time as indicated for disinfecting solutions.

## 2020-02-06 NOTE — Patient Instructions (Signed)
Please begin hydralazine 3 times daily today for blood pressure. Complete lab work prior to leaving.

## 2020-02-11 IMAGING — CT CT CHEST W/O CM
2 of 3 series · 15 of 36 positions shown, 18 images · non-contrast
Comparison: 10/07/2015

CLINICAL DATA: Right upper lobe pulmonary nodules.

EXAM:
CT CHEST WITHOUT CONTRAST
TECHNIQUE: Multidetector CT imaging of the chest was performed following the
standard protocol without IV contrast.

[Series 2: thorax · axial · 0.63mm/px · z∈[-269,-31]mm · 12 of 141 slices shown, 15 images]
[im 11/141  mediastinal]
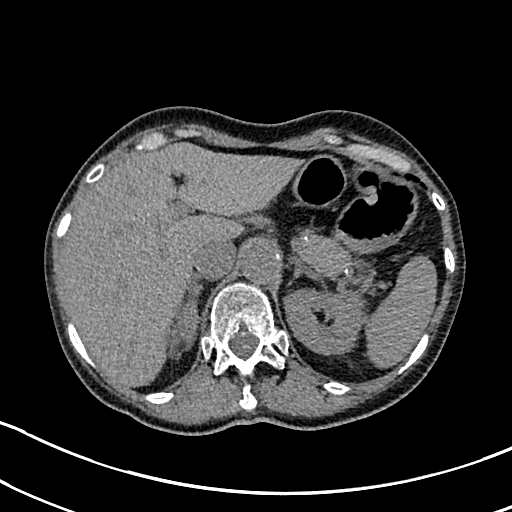
[im 11/141  lung]
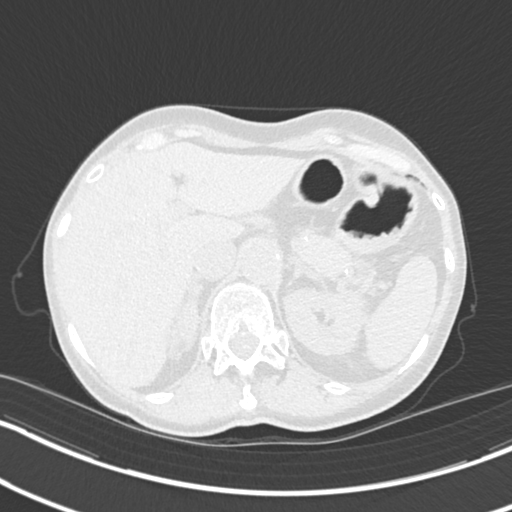
[im 21/141  lung]
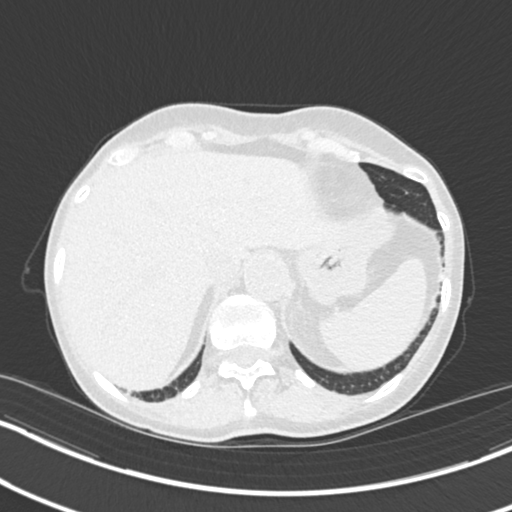
[im 32/141  lung]
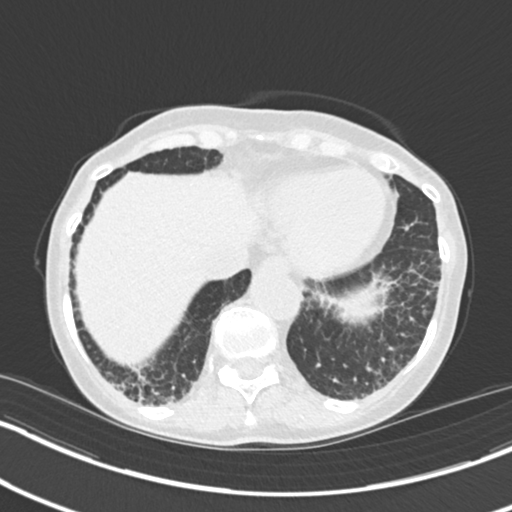
[im 42/141  lung]
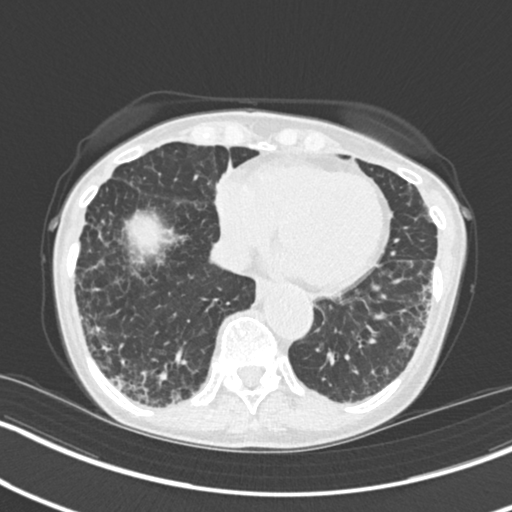
[im 52/141  mediastinal]
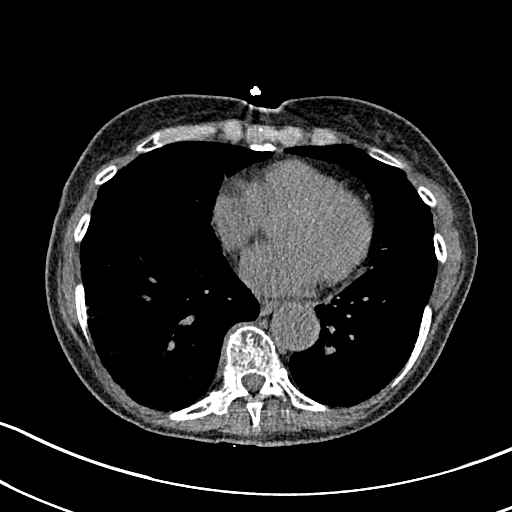
[im 52/141  lung]
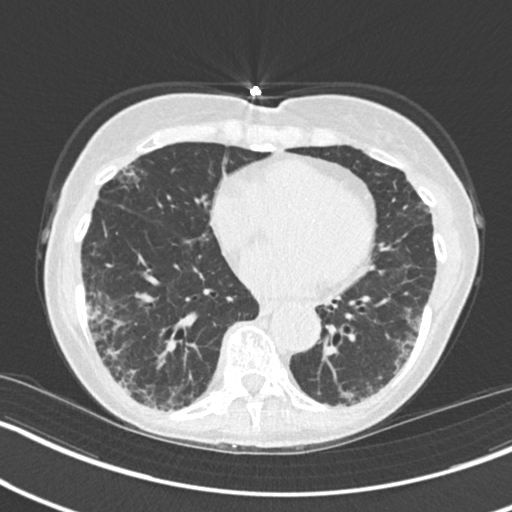
[im 63/141  lung]
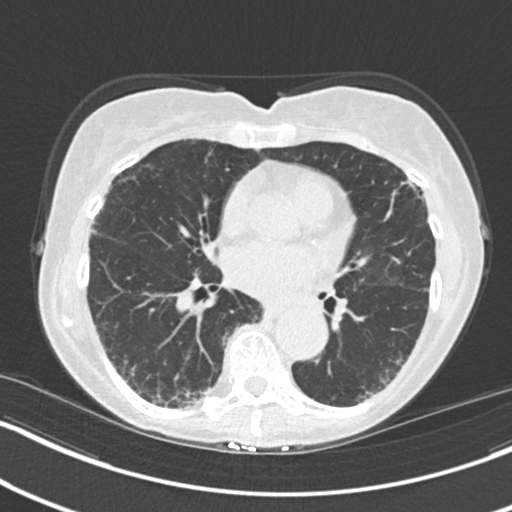
[im 78/141  lung]
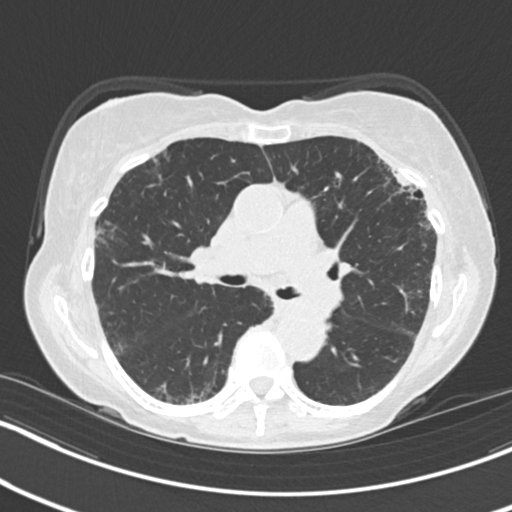
[im 89/141  lung]
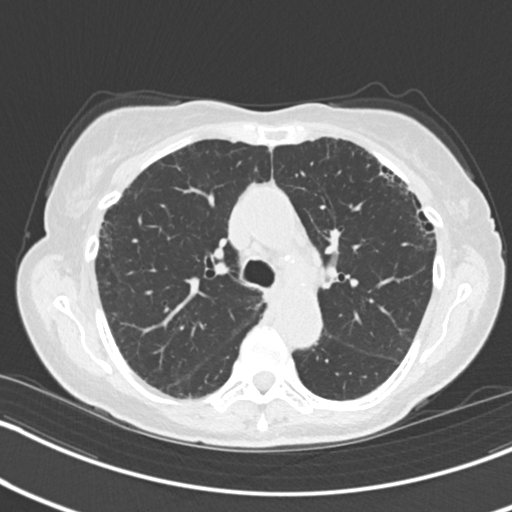
[im 99/141  mediastinal]
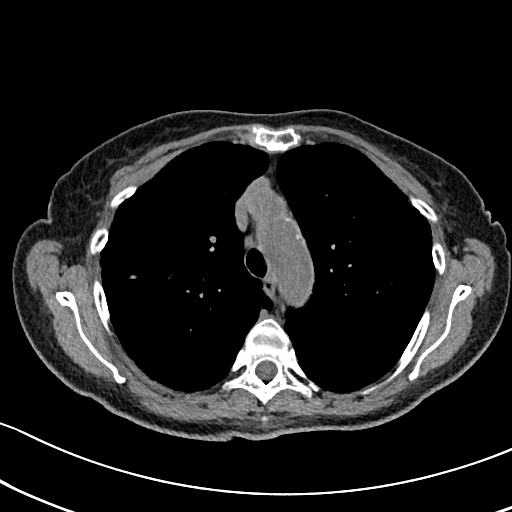
[im 99/141  lung]
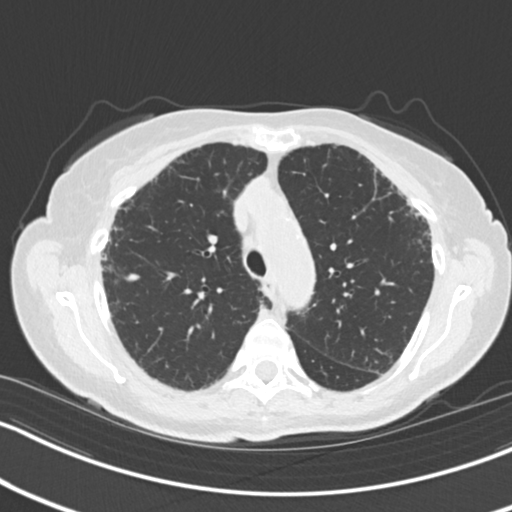
[im 109/141  lung]
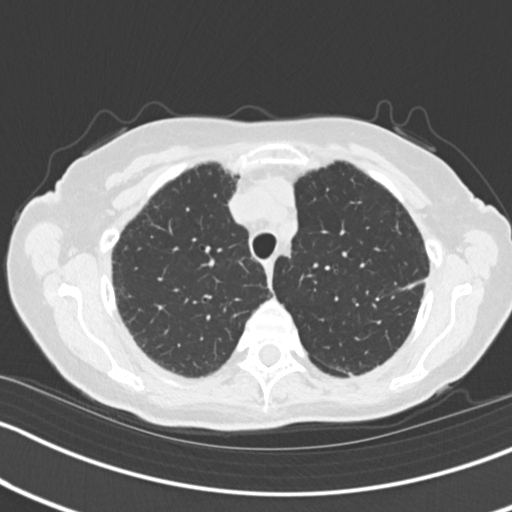
[im 120/141  lung]
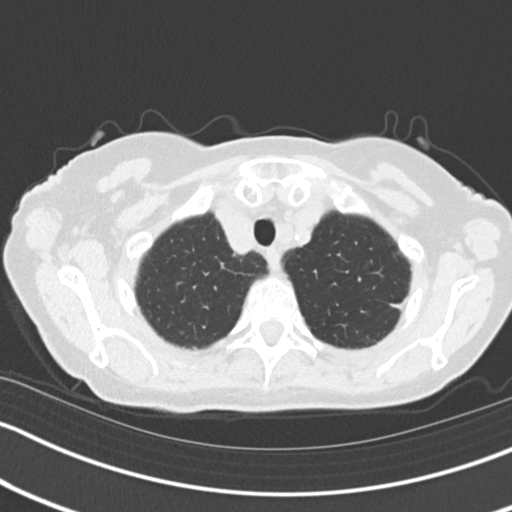
[im 130/141  lung]
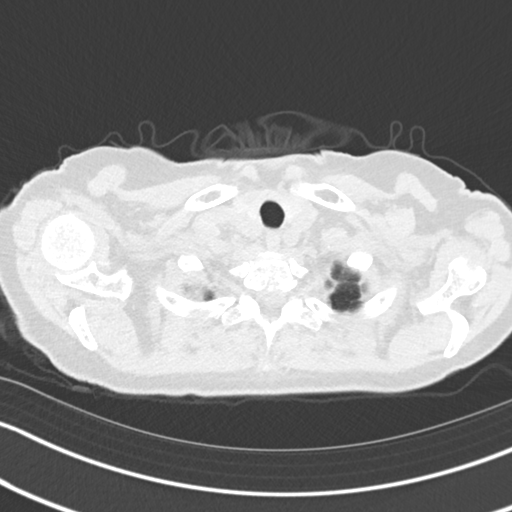

[Series 5: coronal · coronal · 0.59mm/px · 3 of 114 slices shown]
[im 23/114  lung]
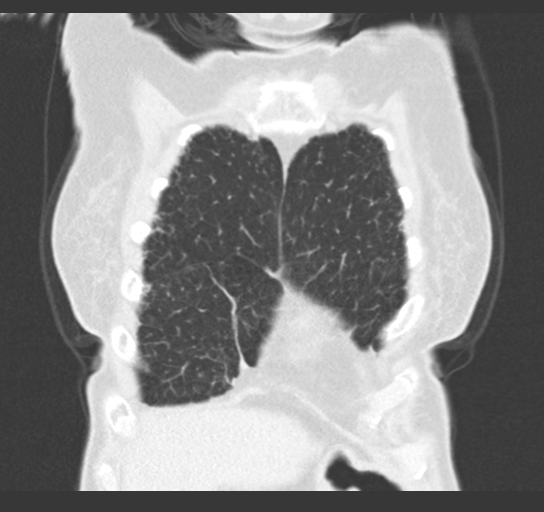
[im 46/114  lung]
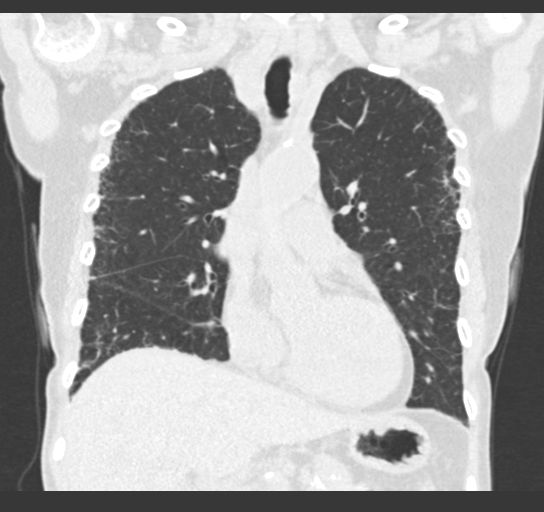
[im 68/114  lung]
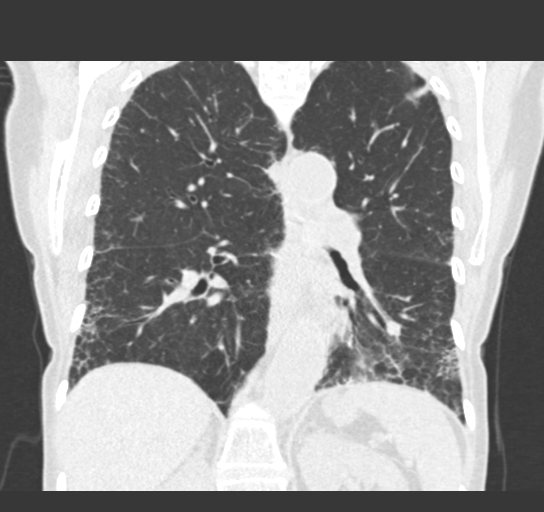

[15 of 36 positions shown; findings below may reference images not displayed]

FINDINGS: Cardiovascular: The heart size is normal. No substantial pericardial
effusion. Coronary artery calcification is evident. Atherosclerotic
calcification is noted in the wall of the thoracic aorta.

Mediastinum/Nodes: No mediastinal lymphadenopathy. No evidence for
gross hilar lymphadenopathy although assessment is limited by the
lack of intravenous contrast on today's study. The esophagus has
normal imaging features. There is no axillary lymphadenopathy.

Lungs/Pleura: Subpleural reticulation again noted bilaterally stable
to minimally progressive in the interval. Bronchiectasis noted in
the right middle lobe, lingula, and both lower lobes. Short segment
airway impaction noted left lower lobe on 84/3. Linear scarring in
the peripheral left upper lobe is stable. Multiple bilateral
pulmonary nodules evident.

*10 mm right upper lobe pulmonary nodule (44/3) is new in the
interval.
*Area of central airway impaction noted anterior right upper lobe on
61/3.
*Anterior left upper lobe pulmonary nodule measures 5 mm on 56/3.
*9 mm peripheral nodular opacity in the left upper lobe is visible
on 65/3.
*Central left lower lobe pulmonary nodule measures 8 mm on 93/3.

No pleural effusion.

Upper Abdomen: Unremarkable.

Musculoskeletal: No worrisome lytic or sclerotic osseous
abnormality.
IMPRESSION: 1. Multiple bilateral pulmonary nodules are new since 10/07/2015 and
measure up to 10 mm maximum diameter. Non-contrast chest CT at 3-6
months is recommended. If the nodules are stable at time of repeat
CT, then future CT at 18-24 months (from today's scan) is considered
optional for low-risk patients, but is recommended for high-risk
patients. This recommendation follows the consensus statement:
Guidelines for Management of Incidental Pulmonary Nodules Detected
[DATE].
2. Areas of bronchiectasis and subpleural reticulation in the lungs
bilaterally. Component of underlying fibrotic lung disease
suspected.
3.  Aortic Atherosclerois (ZPLRY-170.0)

## 2020-02-13 DIAGNOSIS — M170 Bilateral primary osteoarthritis of knee: Secondary | ICD-10-CM | POA: Diagnosis not present

## 2020-02-13 DIAGNOSIS — M2550 Pain in unspecified joint: Secondary | ICD-10-CM | POA: Diagnosis not present

## 2020-02-13 DIAGNOSIS — H15042 Scleritis with corneal involvement, left eye: Secondary | ICD-10-CM | POA: Diagnosis not present

## 2020-02-13 DIAGNOSIS — M0579 Rheumatoid arthritis with rheumatoid factor of multiple sites without organ or systems involvement: Secondary | ICD-10-CM | POA: Diagnosis not present

## 2020-02-13 DIAGNOSIS — H209 Unspecified iridocyclitis: Secondary | ICD-10-CM | POA: Diagnosis not present

## 2020-02-20 ENCOUNTER — Encounter: Payer: Self-pay | Admitting: Family

## 2020-02-20 ENCOUNTER — Ambulatory Visit (INDEPENDENT_AMBULATORY_CARE_PROVIDER_SITE_OTHER): Payer: Medicare Other | Admitting: Family

## 2020-02-20 ENCOUNTER — Other Ambulatory Visit: Payer: Self-pay

## 2020-02-20 VITALS — BP 179/61 | HR 69 | Temp 97.2°F | Resp 16 | Ht 65.0 in | Wt 112.0 lb

## 2020-02-20 DIAGNOSIS — I10 Essential (primary) hypertension: Secondary | ICD-10-CM

## 2020-02-20 DIAGNOSIS — G629 Polyneuropathy, unspecified: Secondary | ICD-10-CM

## 2020-02-20 MED ORDER — GABAPENTIN 100 MG PO CAPS: 100.0000 mg | ORAL_CAPSULE | Freq: Three times a day (TID) | ORAL | 3 refills | Status: DC

## 2020-02-20 MED ORDER — LEFLUNOMIDE 20 MG PO TABS: 20.0000 mg | ORAL_TABLET | Freq: Every day | ORAL | Status: DC

## 2020-02-20 MED ORDER — HYDRALAZINE HCL 50 MG PO TABS: 50.0000 mg | ORAL_TABLET | Freq: Three times a day (TID) | ORAL | 2 refills | Status: DC

## 2020-02-20 NOTE — Progress Notes (Signed)
Subjective:    [name], female    DOB: 1940/11/28, 79 y.o.   MRN: [id number]  HPI  Patient is a 79 yr old female who presents today for follow up.  Last visit we added hydralazine to help with her uncontrolled hypertension. She is also maintained on carvedilol 25 mg bid, losartan 100 mg daily. Reports that she checks sometimes at home.  BP readings range.  BP Readings from Last 3 Encounters:  02/20/20 (!) 179/61  02/06/20 (!) 168/68  10/31/19 (!) 144/54   Wt Readings from Last 3 Encounters:  02/06/20 112 lb (50.8 kg)  10/31/19 109 lb (49.4 kg)  10/09/19 109 lb (49.4 kg)   Neuropathy- not currently taking gabapentin, having pain in her feet.   Review of Systems    see HPI  Past Medical History:  Diagnosis Date  . Allergy   . Anemia    iron deficiency  . Cancer ([place]) 1995   vaginal  . Cataract   . Colon polyps   . CVA (cerebral vascular accident) ([place]) 02/2017  . Depression   . Hyperlipidemia   . Hypertension   . Osteoporosis 01/01/2015  . Rheumatoid arteritis ([place])   . Thyroid disease   . TIA (transient ischemic attack) 2016  . Urinary incontinence      Social History   Socioeconomic History  . Marital status: Divorced    Spouse name: Not on file  . Number of children: 8  . Years of education: Not on file  . Highest education level: Not on file  Occupational History    Employer: RETIRED    Comment: Retired from [employer]  . Smoking status: Current Some Day Smoker    Packs/day: 0.50    Years: 32.00    Pack years: 16.00    Types: Cigarettes  . Smokeless tobacco: Never Used  . Tobacco comment: 6 or 7 per day  Substance and Sexual Activity  . Alcohol use: No    Alcohol/week: 0.0 standard drinks  . Drug use: No  . Sexual activity: Never  Other Topics Concern  . Not on file  Social History Narrative  . Not on file   Social Determinants of Health   Financial Resource Strain:   . Difficulty of Paying Living  Expenses:   Food Insecurity:   . Worried About Charity fundraiser in the Last Year:   . Arboriculturist in the Last Year:   Transportation Needs:   . Film/video editor (Medical):   Marland Kitchen Lack of Transportation (Non-Medical):   Physical Activity:   . Days of Exercise per Week:   . Minutes of Exercise per Session:   Stress:   . Feeling of Stress :   Social Connections:   . Frequency of Communication with Friends and Family:   . Frequency of Social Gatherings with Friends and Family:   . Attends Religious Services:   . Active Member of Clubs or Organizations:   . Attends Archivist Meetings:   Marland Kitchen Marital Status:   Intimate Partner Violence:   . Fear of Current or Ex-Partner:   . Emotionally Abused:   Marland Kitchen Physically Abused:   . Sexually Abused:     Past Surgical History:  Procedure Laterality Date  . ABDOMINAL HYSTERECTOMY  1976  . CATARACT EXTRACTION Bilateral   . LOWER EXTREMITY ANGIOGRAPHY N/A 08/29/2019   Procedure: LOWER EXTREMITY ANGIOGRAPHY;  Surgeon: Serafina Mitchell, MD;  Location: [place]  [facility];  Service: Cardiovascular;  Laterality: N/A;  . PERIPHERAL VASCULAR INTERVENTION Right 08/29/2019   Procedure: PERIPHERAL VASCULAR INTERVENTION;  Surgeon: Serafina Mitchell, MD;  Location: [facility] [facility];  Service: Cardiovascular;  Laterality: Right;  . 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 Hypertension Father   . Colon cancer Father   . Ulcerative colitis Mother   . Parkinson's disease Mother   . Heart disease Sister 22       CABG x 3  . Cirrhosis Brother   . Alcohol abuse Brother   . Colitis Daughter   . Colon polyps Daughter   . Heart attack Daughter   . Hypertension Son   . Kidney disease Son        transplant due to kidney problems after [facility]  . Arthritis Other   . Coronary artery disease Other   . Hyperlipidemia Other   . Hypertension Other   . Stroke Sister        died 80    . Arthritis Sister   . Hypertension Sister   . Thyroid disease Daughter        x 3  . Cirrhosis Brother 65       ETOH abuse    Allergies  Allergen Reactions  . Amlodipine Other (See Comments)    dizziness  . Iron Nausea And Vomiting    Can take infusions     Current Outpatient Medications on File Prior to Visit  Medication Sig Dispense Refill  . Adalimumab (HUMIRA PEN) 40 MG/0.4ML PNKT Inject 40 mg into the skin once a week. Saturday    . atorvastatin (LIPITOR) 80 MG tablet Take 1 tablet (80 mg total) by mouth daily. 90 tablet 1  . carvedilol (COREG) 25 MG tablet TAKE 1 TABLET BY MOUTH TWICE A DAY 180 tablet 1  . clopidogrel (PLAVIX) 75 MG tablet TAKE 1 TABLET BY MOUTH EVERY DAY 90 tablet 1  . Docusate Calcium (STOOL SOFTENER PO) Take 1 tablet by mouth daily as needed (constipation).     . fluticasone (FLONASE) 50 MCG/ACT nasal spray Place 1 spray into both nostrils daily as needed for allergies or rhinitis.    Marland Kitchen gabapentin (NEURONTIN) 100 MG capsule Take 1 capsule (100 mg total) by mouth 3 (three) times daily. 90 capsule 3  . leflunomide (ARAVA) 10 MG tablet Take 10 mg by mouth daily.  3  . levothyroxine (SYNTHROID) 100 MCG tablet TAKE 1 TABLET BY MOUTH EVERY DAY 90 tablet 1  . loratadine (CLARITIN) 10 MG tablet Take 10 mg by mouth daily as needed for allergies.     Marland Kitchen losartan (COZAAR) 100 MG tablet TAKE 1 TABLET BY MOUTH EVERY DAY 90 tablet 1  . mycophenolate (CELLCEPT) 500 MG tablet Take 500 mg by mouth daily.    . prednisoLONE acetate (PRED FORTE) 1 % ophthalmic suspension Place 1 drop into both eyes daily.     . predniSONE (DELTASONE) 5 MG tablet Take 5 mg by mouth daily.    Marland Kitchen PROLIA 60 MG/ML SOSY injection Inject 60 mg as directed every 6 (six) months.    . [DISCONTINUED] hydrALAZINE (APRESOLINE) 25 MG tablet Take 1 tablet (25 mg total) by mouth 3 (three) times daily. 90 tablet 3   Current Facility-Administered Medications on File Prior to Visit  Medication Dose Route  Frequency Provider Last Rate Last Admin  . cyanocobalamin ((VITAMIN B-12)) injection 1,000  mcg  1,000 mcg Intramuscular Q30 days Debbrah Alar, NP   1,000 mcg at 01/05/20 1109    BP (!) 179/61 (BP Location: Right Arm, Patient Position: Sitting, Cuff Size: Small)   Pulse 69   Temp (!) 97.2 F (36.2 C) (Temporal)   Resp 16   Ht 5\' 5"  (1.651 m)   Wt 112 lb (50.8 kg)   SpO2 100%   BMI 18.64 kg/m    Objective:   Physical Exam Constitutional:      Appearance: She is well-developed.  Neck:     Thyroid: No thyromegaly.  Cardiovascular:     Rate and Rhythm: Normal rate and regular rhythm.     Heart sounds: Normal heart sounds. No murmur.  Pulmonary:     Effort: Pulmonary effort is normal. No respiratory distress.     Breath sounds: Normal breath sounds. No wheezing.  Musculoskeletal:     Cervical back: Neck supple.  Skin:    General: Skin is warm and dry.  Neurological:     Mental Status: She is alert and oriented to person, place, and time.  Psychiatric:        Behavior: Behavior normal.        Thought Content: Thought content normal.        Judgment: Judgment normal.           Assessment & Plan:  Hypertension-uncontrolled.  Will increase hydralazine to 50 mg p.o. 3 times daily.  Peripheral neuropathy-uncontrolled.  She is not currently taking gabapentin.  Will restart.  This visit occurred during the SARS-CoV-2 public health emergency.  Safety protocols were in place, including screening questions prior to the visit, additional usage of staff PPE, and extensive cleaning of exam room while observing appropriate contact time as indicated for disinfecting solutions.

## 2020-02-20 NOTE — Patient Instructions (Signed)
Please increase hydralazine to 50mg  three times daily. Check your blood pressure once daily for 1 week. Send me your readings in 1 week (by phone or by Mychart message).

## 2020-03-14 DIAGNOSIS — M0579 Rheumatoid arthritis with rheumatoid factor of multiple sites without organ or systems involvement: Secondary | ICD-10-CM | POA: Diagnosis not present

## 2020-03-20 ENCOUNTER — Ambulatory Visit: Payer: Medicare Other | Admitting: Family

## 2020-03-25 ENCOUNTER — Other Ambulatory Visit: Payer: Self-pay

## 2020-03-25 ENCOUNTER — Encounter: Payer: Self-pay | Admitting: Family

## 2020-03-25 ENCOUNTER — Ambulatory Visit (INDEPENDENT_AMBULATORY_CARE_PROVIDER_SITE_OTHER): Payer: Medicare Other | Admitting: Family

## 2020-03-25 VITALS — BP 137/52 | HR 67 | Temp 97.6°F | Resp 16 | Ht 65.0 in | Wt 106.0 lb

## 2020-03-25 DIAGNOSIS — I10 Essential (primary) hypertension: Secondary | ICD-10-CM

## 2020-03-25 DIAGNOSIS — E538 Deficiency of other specified B group vitamins: Secondary | ICD-10-CM

## 2020-03-25 DIAGNOSIS — E785 Hyperlipidemia, unspecified: Secondary | ICD-10-CM | POA: Diagnosis not present

## 2020-03-25 DIAGNOSIS — R197 Diarrhea, unspecified: Secondary | ICD-10-CM | POA: Diagnosis not present

## 2020-03-25 DIAGNOSIS — R1013 Epigastric pain: Secondary | ICD-10-CM | POA: Diagnosis not present

## 2020-03-25 DIAGNOSIS — M810 Age-related osteoporosis without current pathological fracture: Secondary | ICD-10-CM

## 2020-03-25 DIAGNOSIS — R110 Nausea: Secondary | ICD-10-CM

## 2020-03-25 DIAGNOSIS — M059 Rheumatoid arthritis with rheumatoid factor, unspecified: Secondary | ICD-10-CM

## 2020-03-25 LAB — CBC WITH DIFFERENTIAL/PLATELET
Basophils Absolute: 0 10*3/uL (ref 0.0–0.1)
Basophils Relative: 0.5 % (ref 0.0–3.0)
Eosinophils Absolute: 0.3 10*3/uL (ref 0.0–0.7)
Eosinophils Relative: 4.7 % (ref 0.0–5.0)
HCT: 34.6 % — ABNORMAL LOW (ref 36.0–46.0)
Hemoglobin: 11.2 g/dL — ABNORMAL LOW (ref 12.0–15.0)
Lymphocytes Relative: 22.1 % (ref 12.0–46.0)
Lymphs Abs: 1.5 10*3/uL (ref 0.7–4.0)
MCHC: 32.5 g/dL (ref 30.0–36.0)
MCV: 88.6 fl (ref 78.0–100.0)
Monocytes Absolute: 0.7 10*3/uL (ref 0.1–1.0)
Monocytes Relative: 10.7 % (ref 3.0–12.0)
Neutro Abs: 4.3 10*3/uL (ref 1.4–7.7)
Neutrophils Relative %: 62 % (ref 43.0–77.0)
Platelets: 234 10*3/uL (ref 150.0–400.0)
RBC: 3.91 Mil/uL (ref 3.87–5.11)
RDW: 14.7 % (ref 11.5–15.5)
WBC: 6.9 10*3/uL (ref 4.0–10.5)

## 2020-03-25 LAB — COMPREHENSIVE METABOLIC PANEL
ALT: 66 U/L — ABNORMAL HIGH (ref 0–35)
AST: 53 U/L — ABNORMAL HIGH (ref 0–37)
Albumin: 3.5 g/dL (ref 3.5–5.2)
Alkaline Phosphatase: 118 U/L — ABNORMAL HIGH (ref 39–117)
BUN: 9 mg/dL (ref 6–23)
CO2: 27 mEq/L (ref 19–32)
Calcium: 8.8 mg/dL (ref 8.4–10.5)
Chloride: 102 mEq/L (ref 96–112)
Creatinine, Ser: 0.74 mg/dL (ref 0.40–1.20)
GFR: 91.6 mL/min (ref >60.00)
Glucose, Bld: 71 mg/dL (ref 70–99)
Potassium: 3.2 mEq/L — ABNORMAL LOW (ref 3.5–5.1)
Sodium: 138 mEq/L (ref 135–145)
Total Bilirubin: 0.7 mg/dL (ref 0.2–1.2)
Total Protein: 6.4 g/dL (ref 6.0–8.3)

## 2020-03-25 LAB — LIPASE: Lipase: 117 U/L — ABNORMAL HIGH (ref 11.0–59.0)

## 2020-03-25 MED ORDER — ONDANSETRON HCL 4 MG PO TABS: 4.0000 mg | ORAL_TABLET | Freq: Three times a day (TID) | ORAL | 0 refills | Status: DC | PRN

## 2020-03-25 MED ORDER — CYANOCOBALAMIN 1000 MCG/ML IJ SOLN
1000.0000 ug | Freq: Once | INTRAMUSCULAR | Status: AC
  Administered 2020-03-25: 1000 ug via INTRAMUSCULAR

## 2020-03-25 MED ORDER — HYDRALAZINE HCL 50 MG PO TABS: 25.0000 mg | ORAL_TABLET | Freq: Three times a day (TID) | ORAL | 2 refills | Status: DC

## 2020-03-25 NOTE — Patient Instructions (Addendum)
Please call Dr. Agustina Caroli office to schedule a follow up appointment-  [phone] Complete lab work prior to leaving.

## 2020-03-25 NOTE — Progress Notes (Signed)
Subjective:    [name], female    DOB: 1941-03-12, 79 y.o.   MRN: [id number]  HPI  Patient is a 79 yr old female who presents today for follow up.  HTN- maintained on coreg, hydralazine, losartan.   BP Readings from Last 3 Encounters:  03/25/20 (!) 137/52  02/20/20 (!) 179/61  02/06/20 (!) 168/68   Diarrhea- reports 2 week history of diarrhea which is now improved.  She has had some associated nausea but no vomiting. Reports loose stools but no longer watery.  BM looked "greenish."  + nausea but no pain.   Wt Readings from Last 3 Encounters:  03/25/20 106 lb (48.1 kg)  02/20/20 112 lb (50.8 kg)  02/06/20 112 lb (50.8 kg)   Hypothyroid-  Lab Results  Component Value Date   TSH 1.10 02/06/2020   RA- on prednisone, cellcept, arava- being managed by rheumatology. Reports symptoms are "about the same."  Denies significant pain.    Hyperlipidemia- maintained on atorvastatin.  Lab Results  Component Value Date   CHOL 236 (H) 12/29/2018   HDL 69.50 12/29/2018   LDLCALC 149 (H) 12/29/2018   TRIG 89.0 12/29/2018   CHOLHDL 3 12/29/2018   Feeling a little depressed.  Just got her dentures and is getting used to them.    Osteoporosis- maintained on prolia q6 months.  B12 deficiency- due for b12 today  Review of Systems See HPI  Past Medical History:  Diagnosis Date  . Allergy   . Anemia    iron deficiency  . Cancer ([place]) 1995   vaginal  . Cataract   . Colon polyps   . CVA (cerebral vascular accident) ([place]) 02/2017  . Depression   . Hyperlipidemia   . Hypertension   . Osteoporosis 01/01/2015  . Rheumatoid arteritis ([place])   . Thyroid disease   . TIA (transient ischemic attack) 2016  . Urinary incontinence      Social History   Socioeconomic History  . Marital status: Divorced    Spouse name: Not on file  . Number of children: 8  . Years of education: Not on file  . Highest education level: Not on file  Occupational History    Employer:  RETIRED    Comment: Retired from [employer]  . Smoking status: Current Some Day Smoker    Packs/day: 0.50    Years: 32.00    Pack years: 16.00    Types: Cigarettes  . Smokeless tobacco: Never Used  . Tobacco comment: 6 or 7 per day  Substance and Sexual Activity  . Alcohol use: No    Alcohol/week: 0.0 standard drinks  . Drug use: No  . Sexual activity: Never  Other Topics Concern  . Not on file  Social History Narrative  . Not on file   Social Determinants of Health   Financial Resource Strain:   . Difficulty of Paying Living Expenses:   Food Insecurity:   . Worried About Charity fundraiser in the Last Year:   . Arboriculturist in the Last Year:   Transportation Needs:   . Film/video editor (Medical):   Marland Kitchen Lack of Transportation (Non-Medical):   Physical Activity:   . Days of Exercise per Week:   . Minutes of Exercise per Session:   Stress:   . Feeling of Stress :   Social Connections:   . Frequency of Communication with Friends and Family:   . Frequency of Social Gatherings  with Friends and Family:   . Attends Religious Services:   . Active Member of Clubs or Organizations:   . Attends Archivist Meetings:   Marland Kitchen Marital Status:   Intimate Partner Violence:   . Fear of Current or Ex-Partner:   . Emotionally Abused:   Marland Kitchen Physically Abused:   . Sexually Abused:     Past Surgical History:  Procedure Laterality Date  . ABDOMINAL HYSTERECTOMY  1976  . CATARACT EXTRACTION Bilateral   . LOWER EXTREMITY ANGIOGRAPHY N/A 08/29/2019   Procedure: LOWER EXTREMITY ANGIOGRAPHY;  Surgeon: Serafina Mitchell, MD;  Location: [place] [facility];  Service: Cardiovascular;  Laterality: N/A;  . PERIPHERAL VASCULAR INTERVENTION Right 08/29/2019   Procedure: PERIPHERAL VASCULAR INTERVENTION;  Surgeon: Serafina Mitchell, MD;  Location: [place] [facility];  Service: Cardiovascular;  Laterality: Right;  . TUMOR REMOVAL  last was 1976   left ankle x3     Family History  Problem Relation Age of Onset  . Hypertension Father   . Colon cancer Father   . Ulcerative colitis Mother   . Parkinson's disease Mother   . Heart disease Sister 53       CABG x 3  . Cirrhosis Brother   . Alcohol abuse Brother   . Colitis Daughter   . Colon polyps Daughter   . Heart attack Daughter   . Hypertension Son   . Kidney disease Son        transplant due to kidney problems after [facility]  . Arthritis Other   . Coronary artery disease Other   . Hyperlipidemia Other   . Hypertension Other   . Stroke Sister        died 53  . Arthritis Sister   . Hypertension Sister   . Thyroid disease Daughter        x 3  . Cirrhosis Brother 65       ETOH abuse    Allergies  Allergen Reactions  . Amlodipine Other (See Comments)    dizziness  . Iron Nausea And Vomiting    Can take infusions     Current Outpatient Medications on File Prior to Visit  Medication Sig Dispense Refill  . Adalimumab (HUMIRA PEN) 40 MG/0.4ML PNKT Inject 40 mg into the skin once a week. Saturday    . atorvastatin (LIPITOR) 80 MG tablet Take 1 tablet (80 mg total) by mouth daily. 90 tablet 1  . carvedilol (COREG) 25 MG tablet TAKE 1 TABLET BY MOUTH TWICE A DAY 180 tablet 1  . clopidogrel (PLAVIX) 75 MG tablet TAKE 1 TABLET BY MOUTH EVERY DAY 90 tablet 1  . Docusate Calcium (STOOL SOFTENER PO) Take 1 tablet by mouth daily as needed (constipation).     . fluticasone (FLONASE) 50 MCG/ACT nasal spray Place 1 spray into both nostrils daily as needed for allergies or rhinitis.    Marland Kitchen gabapentin (NEURONTIN) 100 MG capsule Take 1 capsule (100 mg total) by mouth 3 (three) times daily. 90 capsule 3  . hydrALAZINE (APRESOLINE) 50 MG tablet Take 1 tablet (50 mg total) by mouth 3 (three) times daily. 90 tablet 2  . leflunomide (ARAVA) 20 MG tablet Take 1 tablet (20 mg total) by mouth daily.    Marland Kitchen levothyroxine (SYNTHROID) 100 MCG tablet TAKE 1 TABLET BY MOUTH EVERY DAY 90 tablet 1  .  loratadine (CLARITIN) 10 MG tablet Take 10 mg by mouth daily as needed for allergies.     Marland Kitchen losartan (COZAAR) 100  MG tablet TAKE 1 TABLET BY MOUTH EVERY DAY 90 tablet 1  . mycophenolate (CELLCEPT) 500 MG tablet Take 500 mg by mouth daily.    . prednisoLONE acetate (PRED FORTE) 1 % ophthalmic suspension Place 1 drop into both eyes daily.     . predniSONE (DELTASONE) 5 MG tablet Take 5 mg by mouth daily.    Marland Kitchen PROLIA 60 MG/ML SOSY injection Inject 60 mg as directed every 6 (six) months.     Current Facility-Administered Medications on File Prior to Visit  Medication Dose Route Frequency Provider Last Rate Last Admin  . cyanocobalamin ((VITAMIN B-12)) injection 1,000 mcg  1,000 mcg Intramuscular Q30 days Debbrah Alar, NP   1,000 mcg at 01/05/20 1109    BP (!) 137/52 (BP Location: Right Arm, Patient Position: Sitting, Cuff Size: Small)   Pulse 67   Temp 97.6 F (36.4 C) (Temporal)   Resp 16   Ht 5\' 5"  (1.651 m)   Wt 106 lb (48.1 kg)   SpO2 100%   BMI 17.64 kg/m       Objective:   Physical Exam Constitutional:      Appearance: She is well-developed.  Neck:     Thyroid: No thyromegaly.  Cardiovascular:     Rate and Rhythm: Normal rate and regular rhythm.     Heart sounds: Normal heart sounds. No murmur.  Pulmonary:     Effort: Pulmonary effort is normal. No respiratory distress.     Breath sounds: Normal breath sounds. No wheezing.  Abdominal:     General: There is no distension.     Palpations: Abdomen is soft. There is no mass.     Tenderness: There is abdominal tenderness in the epigastric area. There is no guarding.  Musculoskeletal:     Cervical back: Neck supple.  Skin:    General: Skin is warm and dry.  Neurological:     Mental Status: She is alert and oriented to person, place, and time.  Psychiatric:        Behavior: Behavior normal.        Thought Content: Thought content normal.        Judgment: Judgment normal.           Assessment & Plan:   Abdominal pain- will obtain cmet, cbc diff, lipase.  stool studies.CT abdomen/pelvis.  Zofran as needed for nausea. She is advised to go to ER if unable to keep down PO's or if worsening pain.  Hypothyroid- tsh stable. Continue current dose of synthroid.  Hyperlipidemia- check follow up lipid panel- admits to not taking statin recently.  Depression- will continue to monitor. I think that her abdominal symptoms are weighing on her right now.  B12 deficiency- b12 injection today and monthly.  HTN- bp looks good today. Pt is only taking 25mg  of hydralazine. Advised ok to continue at this dose.  RA- fair control, management per rheumatology.  Osteoporosis- will check approval for prolia injection.   This visit occurred during the SARS-CoV-2 public health emergency.  Safety protocols were in place, including screening questions prior to the visit, additional usage of staff PPE, and extensive cleaning of exam room while observing appropriate contact time as indicated for disinfecting solutions.

## 2020-03-26 ENCOUNTER — Other Ambulatory Visit (INDEPENDENT_AMBULATORY_CARE_PROVIDER_SITE_OTHER): Payer: Medicare Other

## 2020-03-26 ENCOUNTER — Telehealth: Payer: Self-pay | Admitting: Family

## 2020-03-26 ENCOUNTER — Other Ambulatory Visit: Payer: Medicare Other

## 2020-03-26 ENCOUNTER — Telehealth: Payer: Self-pay

## 2020-03-26 DIAGNOSIS — E785 Hyperlipidemia, unspecified: Secondary | ICD-10-CM

## 2020-03-26 DIAGNOSIS — K859 Acute pancreatitis without necrosis or infection, unspecified: Secondary | ICD-10-CM

## 2020-03-26 DIAGNOSIS — R197 Diarrhea, unspecified: Secondary | ICD-10-CM

## 2020-03-26 DIAGNOSIS — E876 Hypokalemia: Secondary | ICD-10-CM

## 2020-03-26 LAB — LIPID PANEL
Cholesterol: 186 mg/dL (ref 0–200)
HDL: 43.2 mg/dL (ref >39.00)
LDL Cholesterol: 116 mg/dL — ABNORMAL HIGH (ref 0–99)
NonHDL: 143.02
Total CHOL/HDL Ratio: 4
Triglycerides: 133 mg/dL (ref 0.0–149.0)
VLDL: 26.6 mg/dL (ref 0.0–40.0)

## 2020-03-26 MED ORDER — POTASSIUM CHLORIDE CRYS ER 20 MEQ PO TBCR
40.0000 meq | EXTENDED_RELEASE_TABLET | Freq: Once | ORAL | 0 refills | Status: DC
Start: 2020-03-26 — End: 2020-10-24

## 2020-03-26 NOTE — Telephone Encounter (Signed)
Sent information in to [employer] assist waiting on summary of benefits from insurance company.

## 2020-03-26 NOTE — Telephone Encounter (Signed)
-----   Message from Debbrah Alar, NP sent at 03/25/2020 10:40 AM EDT ----- Can you please check if she is due for prolia? I don't see one in the last 6 months. She will return in 1 week to see me so we could give then if she is due. Tks

## 2020-03-26 NOTE — Telephone Encounter (Signed)
Please advise pt that her potassium is low, likely due to recent diarrhea. I would like her to take kdur 40 meq once today.  Repeat bmet tomorrow.   Also, her lab work is suggesting pancreatitis.  I would recommend that she switch to a clear diet and complete CT scan tomorrow as scheduled. I will let her know how the CT looks and give her further recommendations at that time.

## 2020-03-26 NOTE — Addendum Note (Signed)
Addended by: Kelle Darting A on: 03/26/2020 12:15 PM   Modules accepted: Orders

## 2020-03-27 ENCOUNTER — Ambulatory Visit (HOSPITAL_BASED_OUTPATIENT_CLINIC_OR_DEPARTMENT_OTHER)
Admission: RE | Admit: 2020-03-27 | Discharge: 2020-03-27 | Disposition: A | Discharge status: Home / Self Care | Payer: Medicare Other | Source: Ambulatory Visit | Attending: Family | Admitting: Family

## 2020-03-27 ENCOUNTER — Other Ambulatory Visit: Payer: Self-pay

## 2020-03-27 ENCOUNTER — Encounter (HOSPITAL_BASED_OUTPATIENT_CLINIC_OR_DEPARTMENT_OTHER): Payer: Self-pay

## 2020-03-27 DIAGNOSIS — R1013 Epigastric pain: Secondary | ICD-10-CM | POA: Insufficient documentation

## 2020-03-27 DIAGNOSIS — K6389 Other specified diseases of intestine: Secondary | ICD-10-CM | POA: Diagnosis not present

## 2020-03-27 MED ORDER — IOHEXOL 300 MG/ML  SOLN
100.0000 mL | Freq: Once | INTRAMUSCULAR | Status: AC | PRN
  Administered 2020-03-27: 100 mL via INTRAVENOUS

## 2020-03-28 LAB — CLOSTRIDIUM DIFFICILE BY PCR: Toxigenic C. Difficile by PCR: NEGATIVE

## 2020-03-28 NOTE — Telephone Encounter (Signed)
Patient feeling better, "diarrhea and nausea has stopped", she will take potassium today and be here tomorrow for labs.

## 2020-03-28 NOTE — Telephone Encounter (Addendum)
Please call pt and let her know that her CT looks OK.  Stool is negative for c diff. How is she feeling?  If she is feeling better she can slowly advance her diet from clears to regular diet.  I would like her to repeat her lab work.  If still having nausea/abdominal pain then I will plan a referral to GI.

## 2020-03-29 ENCOUNTER — Other Ambulatory Visit: Payer: Self-pay

## 2020-03-29 ENCOUNTER — Other Ambulatory Visit (INDEPENDENT_AMBULATORY_CARE_PROVIDER_SITE_OTHER): Payer: Medicare Other

## 2020-03-29 DIAGNOSIS — E876 Hypokalemia: Secondary | ICD-10-CM

## 2020-03-29 DIAGNOSIS — K859 Acute pancreatitis without necrosis or infection, unspecified: Secondary | ICD-10-CM

## 2020-03-29 LAB — BASIC METABOLIC PANEL
BUN: 8 mg/dL (ref 6–23)
CO2: 27 mEq/L (ref 19–32)
Calcium: 9.1 mg/dL (ref 8.4–10.5)
Chloride: 104 mEq/L (ref 96–112)
Creatinine, Ser: 0.75 mg/dL (ref 0.40–1.20)
GFR: 90.19 mL/min (ref >60.00)
Glucose, Bld: 114 mg/dL — ABNORMAL HIGH (ref 70–99)
Potassium: 4.5 mEq/L (ref 3.5–5.1)
Sodium: 136 mEq/L (ref 135–145)

## 2020-03-29 LAB — HEPATIC FUNCTION PANEL
ALT: 49 U/L — ABNORMAL HIGH (ref 0–35)
AST: 45 U/L — ABNORMAL HIGH (ref 0–37)
Albumin: 3.5 g/dL (ref 3.5–5.2)
Alkaline Phosphatase: 115 U/L (ref 39–117)
Bilirubin, Direct: 0.2 mg/dL (ref 0.0–0.3)
Total Bilirubin: 0.8 mg/dL (ref 0.2–1.2)
Total Protein: 6.3 g/dL (ref 6.0–8.3)

## 2020-03-29 LAB — LIPASE: Lipase: 70 U/L — ABNORMAL HIGH (ref 11.0–59.0)

## 2020-04-01 ENCOUNTER — Ambulatory Visit: Payer: Medicare Other | Admitting: Family

## 2020-04-01 LAB — STOOL CULTURE
MICRO NUMBER:: 10490467
MICRO NUMBER:: 10490468
MICRO NUMBER:: 10490469
SHIGA RESULT:: NOT DETECTED
SPECIMEN QUALITY:: ADEQUATE
SPECIMEN QUALITY:: ADEQUATE
SPECIMEN QUALITY:: ADEQUATE

## 2020-04-04 ENCOUNTER — Telehealth: Payer: Self-pay | Admitting: Family

## 2020-04-04 NOTE — Telephone Encounter (Signed)
Please schedule pt for follow up with me next week.

## 2020-04-05 NOTE — Telephone Encounter (Signed)
Called patient and was scheduled for 04-10-2020

## 2020-04-10 ENCOUNTER — Encounter: Payer: Self-pay | Admitting: Family

## 2020-04-10 ENCOUNTER — Ambulatory Visit (INDEPENDENT_AMBULATORY_CARE_PROVIDER_SITE_OTHER): Payer: Medicare Other | Admitting: Family

## 2020-04-10 ENCOUNTER — Other Ambulatory Visit: Payer: Self-pay

## 2020-04-10 VITALS — BP 150/63 | HR 65 | Temp 97.4°F | Resp 16 | Ht 65.0 in | Wt 109.4 lb

## 2020-04-10 DIAGNOSIS — M810 Age-related osteoporosis without current pathological fracture: Secondary | ICD-10-CM | POA: Diagnosis not present

## 2020-04-10 DIAGNOSIS — K859 Acute pancreatitis without necrosis or infection, unspecified: Secondary | ICD-10-CM

## 2020-04-10 LAB — COMPREHENSIVE METABOLIC PANEL
ALT: 25 U/L (ref 0–35)
AST: 20 U/L (ref 0–37)
Albumin: 3.7 g/dL (ref 3.5–5.2)
Alkaline Phosphatase: 75 U/L (ref 39–117)
BUN: 11 mg/dL (ref 6–23)
CO2: 30 mEq/L (ref 19–32)
Calcium: 9.7 mg/dL (ref 8.4–10.5)
Chloride: 102 mEq/L (ref 96–112)
Creatinine, Ser: 0.79 mg/dL (ref 0.40–1.20)
GFR: 84.93 mL/min (ref >60.00)
Glucose, Bld: 80 mg/dL (ref 70–99)
Potassium: 5 mEq/L (ref 3.5–5.1)
Sodium: 137 mEq/L (ref 135–145)
Total Bilirubin: 0.8 mg/dL (ref 0.2–1.2)
Total Protein: 6.6 g/dL (ref 6.0–8.3)

## 2020-04-10 LAB — LIPASE: Lipase: 53 U/L (ref 11.0–59.0)

## 2020-04-10 MED ORDER — DENOSUMAB 60 MG/ML ~~LOC~~ SOSY
60.0000 mg | PREFILLED_SYRINGE | Freq: Once | SUBCUTANEOUS | Status: AC
  Administered 2020-04-10: 60 mg via SUBCUTANEOUS

## 2020-04-10 NOTE — Progress Notes (Signed)
Subjective:    [name], female    DOB: 1941-10-22, 79 y.o.   MRN: [id number]  HPI  Patient is a 79 yr old female who presents today for follow up. We saw her on 03/25/20 with c/o epigastric pain and diarrhea. Work up included lab work which was significant for elevated lipase. Stool studies were negative.    Wt Readings from Last 3 Encounters:  04/10/20 109 lb 6.4 oz (49.6 kg)  03/25/20 106 lb (48.1 kg)  02/20/20 112 lb (50.8 kg)  Reports improvement in her appetite.   CT abdomen noted:   IMPRESSION: 1.  No acute findings in the abdomen/pelvis.  2. Cluster of small bowel loops over the anterior right mid abdomen with minimal associated whirling of the mid mesentery. Findings can sometimes be seen with internal hernia. No evidence of bowel obstruction, free fluid or inflammatory change.  3.  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  4.  Aortic Atherosclerosis (ICD10-I70.0).  Osteoporosis- due for prolia shot today.   Review of Systems     Past Medical History:  Diagnosis Date  . Allergy   . Anemia    iron deficiency  . Cancer ([place]) 1995   vaginal  . Cataract   . Colon polyps   . CVA (cerebral vascular accident) ([place]) 02/2017  . Depression   . Hyperlipidemia   . Hypertension   . Osteoporosis 01/01/2015  . Rheumatoid arteritis ([place])   . Thyroid disease   . TIA (transient ischemic attack) 2016  . Urinary incontinence      Social History   Socioeconomic History  . Marital status: Divorced    Spouse name: Not on file  . Number of children: 8  . Years of education: Not on file  . Highest education level: Not on file  Occupational History    Employer: RETIRED    Comment: Retired from [employer]  . Smoking status: Current Some Day Smoker    Packs/day: 0.50    Years: 32.00    Pack years: 16.00    Types: Cigarettes  . Smokeless tobacco: Never Used  . Tobacco comment: 6 or 7 per day  Substance and Sexual  Activity  . Alcohol use: No    Alcohol/week: 0.0 standard drinks  . Drug use: No  . Sexual activity: Never  Other Topics Concern  . Not on file  Social History Narrative  . Not on file   Social Determinants of Health   Financial Resource Strain:   . Difficulty of Paying Living Expenses:   Food Insecurity:   . Worried About Charity fundraiser in the Last Year:   . Arboriculturist in the Last Year:   Transportation Needs:   . Film/video editor (Medical):   Marland Kitchen Lack of Transportation (Non-Medical):   Physical Activity:   . Days of Exercise per Week:   . Minutes of Exercise per Session:   Stress:   . Feeling of Stress :   Social Connections:   . Frequency of Communication with Friends and Family:   . Frequency of Social Gatherings with Friends and Family:   . Attends Religious Services:   . Active Member of Clubs or Organizations:   . Attends Archivist Meetings:   Marland Kitchen Marital Status:   Intimate Partner Violence:   . Fear of Current or Ex-Partner:   . Emotionally Abused:   Marland Kitchen Physically Abused:   . Sexually Abused:  Past Surgical History:  Procedure Laterality Date  . ABDOMINAL HYSTERECTOMY  1976  . CATARACT EXTRACTION Bilateral   . LOWER EXTREMITY ANGIOGRAPHY N/A 08/29/2019   Procedure: LOWER EXTREMITY ANGIOGRAPHY;  Surgeon: Serafina Mitchell, MD;  Location: [place] [facility];  Service: Cardiovascular;  Laterality: N/A;  . PERIPHERAL VASCULAR INTERVENTION Right 08/29/2019   Procedure: PERIPHERAL VASCULAR INTERVENTION;  Surgeon: Serafina Mitchell, MD;  Location: [place] [facility];  Service: Cardiovascular;  Laterality: Right;  . 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 Hypertension Father   . Colon cancer Father   . Ulcerative colitis Mother   . Parkinson's disease Mother   . Heart disease Sister 29       CABG x 3  . Cirrhosis Brother   . Alcohol abuse Brother   . Colitis Daughter   . Colon  polyps Daughter   . Heart attack Daughter   . Hypertension Son   . Kidney disease Son        transplant due to kidney problems after [facility]  . Arthritis Other   . Coronary artery disease Other   . Hyperlipidemia Other   . Hypertension Other   . Stroke Sister        died 39  . Arthritis Sister   . Hypertension Sister   . Thyroid disease Daughter        x 3  . Cirrhosis Brother 65       ETOH abuse    Allergies  Allergen Reactions  . Amlodipine Other (See Comments)    dizziness  . Iron Nausea And Vomiting    Can take infusions     Current Outpatient Medications on File Prior to Visit  Medication Sig Dispense Refill  . Adalimumab (HUMIRA PEN) 40 MG/0.4ML PNKT Inject 40 mg into the skin once a week. Saturday    . atorvastatin (LIPITOR) 80 MG tablet Take 1 tablet (80 mg total) by mouth daily. 90 tablet 1  . carvedilol (COREG) 25 MG tablet TAKE 1 TABLET BY MOUTH TWICE A DAY 180 tablet 1  . clopidogrel (PLAVIX) 75 MG tablet TAKE 1 TABLET BY MOUTH EVERY DAY 90 tablet 1  . Docusate Calcium (STOOL SOFTENER PO) Take 1 tablet by mouth daily as needed (constipation).     . fluticasone (FLONASE) 50 MCG/ACT nasal spray Place 1 spray into both nostrils daily as needed for allergies or rhinitis.    Marland Kitchen gabapentin (NEURONTIN) 100 MG capsule Take 1 capsule (100 mg total) by mouth 3 (three) times daily. 90 capsule 3  . hydrALAZINE (APRESOLINE) 50 MG tablet Take 0.5 tablets (25 mg total) by mouth 3 (three) times daily. 90 tablet 2  . leflunomide (ARAVA) 20 MG tablet Take 1 tablet (20 mg total) by mouth daily.    Marland Kitchen levothyroxine (SYNTHROID) 100 MCG tablet TAKE 1 TABLET BY MOUTH EVERY DAY 90 tablet 1  . loratadine (CLARITIN) 10 MG tablet Take 10 mg by mouth daily as needed for allergies.     Marland Kitchen losartan (COZAAR) 100 MG tablet TAKE 1 TABLET BY MOUTH EVERY DAY 90 tablet 1  . mycophenolate (CELLCEPT) 500 MG tablet Take 500 mg by mouth daily.    . ondansetron (ZOFRAN) 4 MG tablet Take 1 tablet (4  mg total) by mouth every 8 (eight) hours as needed for nausea or vomiting. 20 tablet 0  . prednisoLONE acetate (PRED FORTE) 1 % ophthalmic  suspension Place 1 drop into both eyes daily.     . predniSONE (DELTASONE) 5 MG tablet Take 5 mg by mouth daily.    Marland Kitchen PROLIA 60 MG/ML SOSY injection Inject 60 mg as directed every 6 (six) months.    . potassium chloride SA (KLOR-CON) 20 MEQ tablet Take 2 tablets (40 mEq total) by mouth once for 1 dose. 2 tablet 0   Current Facility-Administered Medications on File Prior to Visit  Medication Dose Route Frequency Provider Last Rate Last Admin  . cyanocobalamin ((VITAMIN B-12)) injection 1,000 mcg  1,000 mcg Intramuscular Q30 days Debbrah Alar, NP   1,000 mcg at 01/05/20 1109    BP (!) 150/63 (BP Location: Right Arm, Patient Position: Sitting, Cuff Size: Small)   Pulse 65   Temp (!) 97.4 F (36.3 C) (Temporal)   Resp 16   Ht 5\' 5"  (1.651 m)   Wt 109 lb 6.4 oz (49.6 kg)   SpO2 100%   BMI 18.21 kg/m    Objective:   Physical Exam Constitutional:      Appearance: She is well-developed.  Neck:     Thyroid: No thyromegaly.  Cardiovascular:     Rate and Rhythm: Normal rate and regular rhythm.     Heart sounds: Normal heart sounds. No murmur.  Pulmonary:     Effort: Pulmonary effort is normal. No respiratory distress.     Breath sounds: Normal breath sounds. No wheezing.  Abdominal:     Palpations: Abdomen is soft.     Tenderness: There is abdominal tenderness in the epigastric area. There is no guarding or rebound.  Musculoskeletal:     Cervical back: Neck supple.  Skin:    General: Skin is warm and dry.  Neurological:     Mental Status: She is alert and oriented to person, place, and time.  Psychiatric:        Behavior: Behavior normal.        Thought Content: Thought content normal.        Judgment: Judgment normal.           Assessment & Plan:  Pancreatitis- clinically improving.  Will check follow up lft/lipase and refer to  GI due to ongoing epigastric tenderness.  Osteoporosis- Continue Prolia.  Order follow up bone density.   This visit occurred during the SARS-CoV-2 public health emergency.  Safety protocols were in place, including screening questions prior to the visit, additional usage of staff PPE, and extensive cleaning of exam room while observing appropriate contact time as indicated for disinfecting solutions.

## 2020-04-10 NOTE — Patient Instructions (Signed)
Please complete lab work prior to leaving. °You should be contacted about scheduling your appointment with GI.  °

## 2020-04-15 DIAGNOSIS — R7989 Other specified abnormal findings of blood chemistry: Secondary | ICD-10-CM | POA: Diagnosis not present

## 2020-04-19 ENCOUNTER — Other Ambulatory Visit: Payer: Self-pay

## 2020-04-19 ENCOUNTER — Ambulatory Visit (HOSPITAL_BASED_OUTPATIENT_CLINIC_OR_DEPARTMENT_OTHER)
Admission: RE | Admit: 2020-04-19 | Discharge: 2020-04-19 | Disposition: A | Discharge status: Home / Self Care | Payer: Medicare Other | Source: Ambulatory Visit | Attending: Family | Admitting: Family

## 2020-04-19 DIAGNOSIS — M810 Age-related osteoporosis without current pathological fracture: Secondary | ICD-10-CM | POA: Diagnosis not present

## 2020-04-19 DIAGNOSIS — M8588 Other specified disorders of bone density and structure, other site: Secondary | ICD-10-CM | POA: Diagnosis not present

## 2020-04-19 DIAGNOSIS — Z780 Asymptomatic menopausal state: Secondary | ICD-10-CM | POA: Diagnosis not present

## 2020-04-22 ENCOUNTER — Other Ambulatory Visit (HOSPITAL_BASED_OUTPATIENT_CLINIC_OR_DEPARTMENT_OTHER): Payer: Medicare Other

## 2020-04-25 ENCOUNTER — Ambulatory Visit: Payer: Medicare Other

## 2020-04-30 ENCOUNTER — Other Ambulatory Visit: Payer: Self-pay | Admitting: Family

## 2020-04-30 MED ORDER — HYDRALAZINE HCL 50 MG PO TABS: 25.0000 mg | ORAL_TABLET | Freq: Three times a day (TID) | ORAL | 2 refills | Status: DC

## 2020-05-02 ENCOUNTER — Other Ambulatory Visit: Payer: Self-pay | Admitting: Family

## 2020-05-16 ENCOUNTER — Encounter: Payer: Self-pay | Admitting: Gastroenterology

## 2020-05-22 DIAGNOSIS — R4020 Unspecified coma: Secondary | ICD-10-CM | POA: Diagnosis not present

## 2020-05-22 DIAGNOSIS — I498 Other specified cardiac arrhythmias: Secondary | ICD-10-CM | POA: Diagnosis not present

## 2020-05-22 DIAGNOSIS — E860 Dehydration: Secondary | ICD-10-CM | POA: Diagnosis not present

## 2020-05-22 DIAGNOSIS — R400 Somnolence: Secondary | ICD-10-CM | POA: Diagnosis not present

## 2020-05-22 DIAGNOSIS — M0579 Rheumatoid arthritis with rheumatoid factor of multiple sites without organ or systems involvement: Secondary | ICD-10-CM | POA: Diagnosis not present

## 2020-05-22 DIAGNOSIS — Z743 Need for continuous supervision: Secondary | ICD-10-CM | POA: Diagnosis not present

## 2020-05-22 DIAGNOSIS — R197 Diarrhea, unspecified: Secondary | ICD-10-CM | POA: Diagnosis not present

## 2020-05-22 DIAGNOSIS — M170 Bilateral primary osteoarthritis of knee: Secondary | ICD-10-CM | POA: Diagnosis not present

## 2020-05-22 DIAGNOSIS — R404 Transient alteration of awareness: Secondary | ICD-10-CM | POA: Diagnosis not present

## 2020-05-22 DIAGNOSIS — R55 Syncope and collapse: Secondary | ICD-10-CM | POA: Diagnosis not present

## 2020-05-22 DIAGNOSIS — R6889 Other general symptoms and signs: Secondary | ICD-10-CM | POA: Diagnosis not present

## 2020-05-22 DIAGNOSIS — R8290 Unspecified abnormal findings in urine: Secondary | ICD-10-CM | POA: Diagnosis not present

## 2020-05-22 DIAGNOSIS — M2550 Pain in unspecified joint: Secondary | ICD-10-CM | POA: Diagnosis not present

## 2020-05-22 DIAGNOSIS — H15042 Scleritis with corneal involvement, left eye: Secondary | ICD-10-CM | POA: Diagnosis not present

## 2020-05-23 DIAGNOSIS — R55 Syncope and collapse: Secondary | ICD-10-CM | POA: Diagnosis not present

## 2020-05-27 ENCOUNTER — Ambulatory Visit: Payer: Medicare Other | Admitting: Family

## 2020-06-18 DIAGNOSIS — M0579 Rheumatoid arthritis with rheumatoid factor of multiple sites without organ or systems involvement: Secondary | ICD-10-CM | POA: Diagnosis not present

## 2020-06-18 DIAGNOSIS — Z79899 Other long term (current) drug therapy: Secondary | ICD-10-CM | POA: Diagnosis not present

## 2020-06-18 DIAGNOSIS — M2550 Pain in unspecified joint: Secondary | ICD-10-CM | POA: Diagnosis not present

## 2020-06-18 DIAGNOSIS — M170 Bilateral primary osteoarthritis of knee: Secondary | ICD-10-CM | POA: Diagnosis not present

## 2020-06-18 DIAGNOSIS — H15042 Scleritis with corneal involvement, left eye: Secondary | ICD-10-CM | POA: Diagnosis not present

## 2020-07-10 DIAGNOSIS — H31092 Other chorioretinal scars, left eye: Secondary | ICD-10-CM | POA: Diagnosis not present

## 2020-07-10 DIAGNOSIS — H40013 Open angle with borderline findings, low risk, bilateral: Secondary | ICD-10-CM | POA: Diagnosis not present

## 2020-07-10 DIAGNOSIS — H15052 Scleromalacia perforans, left eye: Secondary | ICD-10-CM | POA: Diagnosis not present

## 2020-07-10 DIAGNOSIS — Z961 Presence of intraocular lens: Secondary | ICD-10-CM | POA: Diagnosis not present

## 2020-07-11 ENCOUNTER — Other Ambulatory Visit: Payer: Self-pay | Admitting: Family

## 2020-07-12 ENCOUNTER — Other Ambulatory Visit: Payer: Self-pay

## 2020-07-12 ENCOUNTER — Ambulatory Visit (INDEPENDENT_AMBULATORY_CARE_PROVIDER_SITE_OTHER): Payer: Medicare Other | Admitting: Family

## 2020-07-12 VITALS — BP 186/74 | HR 70 | Temp 99.7°F | Resp 16 | Ht 65.0 in | Wt 108.4 lb

## 2020-07-12 DIAGNOSIS — I10 Essential (primary) hypertension: Secondary | ICD-10-CM

## 2020-07-12 DIAGNOSIS — E785 Hyperlipidemia, unspecified: Secondary | ICD-10-CM

## 2020-07-12 MED ORDER — CLONIDINE HCL 0.1 MG PO TABS
0.1000 mg | ORAL_TABLET | Freq: Three times a day (TID) | ORAL | 3 refills | Status: DC
Start: 2020-07-12 — End: 2020-11-18

## 2020-07-12 NOTE — Progress Notes (Signed)
Subjective:    [name], female    DOB: 04-16-1941, 79 y.o.   MRN: [id number]  HPI  Patient is a 79 yr old female who presents today for follo wup.  HTN-  On coreg 25mg  bid, hydralazine 25mg  TID,  Losartan 100mg  BP Readings from Last 3 Encounters:  07/12/20 (!) 186/74  04/10/20 (!) 150/63  03/25/20 (!) 137/52   Completed pfizer series  Hyperlipidemia- maintained on atorvastatin 80mg .  Lab Results  Component Value Date   CHOL 186 03/26/2020   HDL 43.20 03/26/2020   LDLCALC 116 (H) 03/26/2020   TRIG 133.0 03/26/2020   CHOLHDL 4 03/26/2020     Review of Systems    see HPI  Past Medical History:  Diagnosis Date  . Allergy   . Anemia    iron deficiency  . Cancer ([place]) 1995   vaginal  . Cataract   . Colon polyps   . CVA (cerebral vascular accident) ([place]) 02/2017  . Depression   . Hyperlipidemia   . Hypertension   . Osteoporosis 01/01/2015  . Rheumatoid arteritis ([place])   . Thyroid disease   . TIA (transient ischemic attack) 2016  . Urinary incontinence      Social History   Socioeconomic History  . Marital status: Divorced    Spouse name: Not on file  . Number of children: 8  . Years of education: Not on file  . Highest education level: Not on file  Occupational History    Employer: RETIRED    Comment: Retired from [employer]  . Smoking status: Current Some Day Smoker    Packs/day: 0.50    Years: 32.00    Pack years: 16.00    Types: Cigarettes  . Smokeless tobacco: Never Used  . Tobacco comment: 6 or 7 per day  Vaping Use  . Vaping Use: Never used  Substance and Sexual Activity  . Alcohol use: No    Alcohol/week: 0.0 standard drinks  . Drug use: No  . Sexual activity: Never  Other Topics Concern  . Not on file  Social History Narrative  . Not on file   Social Determinants of Health   Financial Resource Strain:   . Difficulty of Paying Living Expenses: Not on file  Food Insecurity:   . Worried About  Charity fundraiser in the Last Year: Not on file  . Ran Out of Food in the Last Year: Not on file  Transportation Needs:   . Lack of Transportation (Medical): Not on file  . Lack of Transportation (Non-Medical): Not on file  Physical Activity:   . Days of Exercise per Week: Not on file  . Minutes of Exercise per Session: Not on file  Stress:   . Feeling of Stress : Not on file  Social Connections:   . Frequency of Communication with Friends and Family: Not on file  . Frequency of Social Gatherings with Friends and Family: Not on file  . Attends Religious Services: Not on file  . Active Member of Clubs or Organizations: Not on file  . Attends Archivist Meetings: Not on file  . Marital Status: Not on file  Intimate Partner Violence:   . Fear of Current or Ex-Partner: Not on file  . Emotionally Abused: Not on file  . Physically Abused: Not on file  . Sexually Abused: Not on file    Past Surgical History:  Procedure Laterality Date  . ABDOMINAL HYSTERECTOMY  1976  .  CATARACT EXTRACTION Bilateral   . LOWER EXTREMITY ANGIOGRAPHY N/A 08/29/2019   Procedure: LOWER EXTREMITY ANGIOGRAPHY;  Surgeon: Serafina Mitchell, MD;  Location: [place] [facility];  Service: Cardiovascular;  Laterality: N/A;  . PERIPHERAL VASCULAR INTERVENTION Right 08/29/2019   Procedure: PERIPHERAL VASCULAR INTERVENTION;  Surgeon: Serafina Mitchell, MD;  Location: [place] [facility];  Service: Cardiovascular;  Laterality: Right;  . 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 Hypertension Father   . Colon cancer Father   . Ulcerative colitis Mother   . Parkinson's disease Mother   . Heart disease Sister 5       CABG x 3  . Cirrhosis Brother   . Alcohol abuse Brother   . Colitis Daughter   . Colon polyps Daughter   . Heart attack Daughter   . Hypertension Son   . Kidney disease Son        transplant due to kidney problems after [facility]    . Arthritis Other   . Coronary artery disease Other   . Hyperlipidemia Other   . Hypertension Other   . Stroke Sister        died 4  . Arthritis Sister   . Hypertension Sister   . Thyroid disease Daughter        x 3  . Cirrhosis Brother 65       ETOH abuse    Allergies  Allergen Reactions  . Amlodipine Other (See Comments)    dizziness  . Iron Nausea And Vomiting    Can take infusions     Current Outpatient Medications on File Prior to Visit  Medication Sig Dispense Refill  . Adalimumab (HUMIRA PEN) 40 MG/0.4ML PNKT Inject 40 mg into the skin once a week. Saturday    . atorvastatin (LIPITOR) 80 MG tablet TAKE 1 TABLET BY MOUTH EVERY DAY 90 tablet 1  . carvedilol (COREG) 25 MG tablet TAKE 1 TABLET BY MOUTH TWICE A DAY 180 tablet 1  . clopidogrel (PLAVIX) 75 MG tablet TAKE 1 TABLET BY MOUTH EVERY DAY 90 tablet 1  . Docusate Calcium (STOOL SOFTENER PO) Take 1 tablet by mouth daily as needed (constipation).     . fluticasone (FLONASE) 50 MCG/ACT nasal spray Place 1 spray into both nostrils daily as needed for allergies or rhinitis.    Marland Kitchen gabapentin (NEURONTIN) 100 MG capsule Take 1 capsule (100 mg total) by mouth 3 (three) times daily. 90 capsule 3  . hydrALAZINE (APRESOLINE) 50 MG tablet Take 0.5 tablets (25 mg total) by mouth 3 (three) times daily. 90 tablet 2  . leflunomide (ARAVA) 20 MG tablet Take 1 tablet (20 mg total) by mouth daily.    Marland Kitchen levothyroxine (SYNTHROID) 100 MCG tablet TAKE 1 TABLET BY MOUTH EVERY DAY 90 tablet 1  . loratadine (CLARITIN) 10 MG tablet Take 10 mg by mouth daily as needed for allergies.     Marland Kitchen losartan (COZAAR) 100 MG tablet TAKE 1 TABLET BY MOUTH EVERY DAY 90 tablet 1  . mycophenolate (CELLCEPT) 500 MG tablet Take 500 mg by mouth daily.    . ondansetron (ZOFRAN) 4 MG tablet Take 1 tablet (4 mg total) by mouth every 8 (eight) hours as needed for nausea or vomiting. 20 tablet 0  . prednisoLONE acetate (PRED FORTE) 1 % ophthalmic suspension Place 1  drop into both eyes daily.     . predniSONE (DELTASONE) 5  MG tablet Take 5 mg by mouth daily.    Marland Kitchen PROLIA 60 MG/ML SOSY injection Inject 60 mg as directed every 6 (six) months.    . potassium chloride SA (KLOR-CON) 20 MEQ tablet Take 2 tablets (40 mEq total) by mouth once for 1 dose. 2 tablet 0   Current Facility-Administered Medications on File Prior to Visit  Medication Dose Route Frequency Provider Last Rate Last Admin  . cyanocobalamin ((VITAMIN B-12)) injection 1,000 mcg  1,000 mcg Intramuscular Q30 days Debbrah Alar, NP   1,000 mcg at 01/05/20 1109    BP (!) 186/74 (BP Location: Left Arm, Patient Position: Sitting, Cuff Size: Small)   Pulse 70   Temp 99.7 F (37.6 C) (Oral)   Resp 16   Ht 5\' 5"  (1.651 m)   Wt 108 lb 6.4 oz (49.2 kg)   SpO2 100%   BMI 18.04 kg/m    Objective:   Physical Exam Constitutional:      Appearance: She is well-developed.  Neck:     Thyroid: No thyromegaly.  Cardiovascular:     Rate and Rhythm: Normal rate and regular rhythm.     Heart sounds: Normal heart sounds. No murmur heard.   Pulmonary:     Effort: Pulmonary effort is normal. No respiratory distress.     Breath sounds: Normal breath sounds. No wheezing.  Musculoskeletal:     Cervical back: Neck supple.  Skin:    General: Skin is warm and dry.  Neurological:     Mental Status: She is alert and oriented to person, place, and time.  Psychiatric:        Behavior: Behavior normal.        Thought Content: Thought content normal.        Judgment: Judgment normal.           Assessment & Plan:  HTN- bp uncontrolled.  Will add clonidine bid.   Hyperlipidemia- lipids are at goal. Continue same.   This visit occurred during the SARS-CoV-2 public health emergency.  Safety protocols were in place, including screening questions prior to the visit, additional usage of staff PPE, and extensive cleaning of exam room while observing appropriate contact time as indicated for  disinfecting solutions.

## 2020-07-17 ENCOUNTER — Ambulatory Visit (INDEPENDENT_AMBULATORY_CARE_PROVIDER_SITE_OTHER): Payer: Medicare Other | Admitting: Gastroenterology

## 2020-07-17 ENCOUNTER — Encounter: Payer: Self-pay | Admitting: Gastroenterology

## 2020-07-17 VITALS — BP 90/50 | HR 72 | Ht 63.25 in | Wt 108.5 lb

## 2020-07-17 DIAGNOSIS — R1030 Lower abdominal pain, unspecified: Secondary | ICD-10-CM

## 2020-07-17 DIAGNOSIS — R748 Abnormal levels of other serum enzymes: Secondary | ICD-10-CM | POA: Diagnosis not present

## 2020-07-17 DIAGNOSIS — K582 Mixed irritable bowel syndrome: Secondary | ICD-10-CM

## 2020-07-17 NOTE — Patient Instructions (Signed)
Take Benefiber 1 tablespoon three times a day with meals   Increase water intake to 8-10 cups daily  If you are age 79 or older, your body mass index should be between 23-30. Your Body mass index is 19.07 kg/m. If this is out of the aforementioned range listed, please consider follow up with your Primary Care Provider.  If you are age 33 or younger, your body mass index should be between 19-25. Your Body mass index is 19.07 kg/m. If this is out of the aformentioned range listed, please consider follow up with your Primary Care Provider.    I appreciate the  opportunity to care for you  Thank You   Harl Bowie , MD

## 2020-07-17 NOTE — Progress Notes (Signed)
Nancy [name]    [id number]    January 24, 1941  Primary Care Physician:O'Sullivan, Lenna Sciara, NP  Referring Physician: Debbrah Alar, NP [address] [place],  [place] [id number]   Chief complaint: lower abdominal pain, elevated lipase HPI: 79 year old very pleasant female with history of RA on immunosuppressive therapy (Humira), chronic smoker, here to discuss elevated lipase   She has alternative constipation and diarrhea. She has watery stool for few days and sometimes can go for a week without a bowel movement.  She has associated left lower quadrant abdominal discomfort when she does not have a bowel movement for many days  She had mild elevation in transaminases and lipase in May, 2021  Denies any severe upper abdominal pain, vomiting or nausea.  No weight loss or decreased appetite.  CMP Latest Ref Rng & Units 04/10/2020 03/29/2020 03/25/2020  Glucose 70 - 99 mg/dL 80 114(H) 71  BUN 6 - 23 mg/dL 11 8 9   Creatinine 0.40 - 1.20 mg/dL 0.79 0.75 0.74  Sodium 135 - 145 mEq/L 137 136 138  Potassium 3.5 - 5.1 mEq/L 5.0 4.5 3.2(L)  Chloride 96 - 112 mEq/L 102 104 102  CO2 19 - 32 mEq/L 30 27 27   Calcium 8.4 - 10.5 mg/dL 9.7 9.1 8.8  Total Protein 6.0 - 8.3 g/dL 6.6 6.3 6.4  Total Bilirubin 0.2 - 1.2 mg/dL 0.8 0.8 0.7  Alkaline Phos 39 - 117 U/L 75 115 118(H)  AST 0 - 37 U/L 20 45(H) 53(H)  ALT 0 - 35 U/L 25 49(H) 66(H)   Lipase     Component Value Date/Time   LIPASE 53.0 04/10/2020 1109    CT abdomen pelvis Mar 27, 2020: No acute abnormality.  Cluster of small bowel in mid abdomen with minimal overlying in mid mesentery, possible internal hernia.  No bowel obstruction, free fluid or inflammatory changes  Colonoscopy October 24, 2015: Normal, no recall due to age  Outpatient Encounter Medications as of 07/17/2020  Medication Sig  . Adalimumab (HUMIRA PEN) 40 MG/0.4ML PNKT Inject 40 mg into the skin once a week. Saturday  . atorvastatin (LIPITOR) 80  MG tablet TAKE 1 TABLET BY MOUTH EVERY DAY  . carvedilol (COREG) 25 MG tablet TAKE 1 TABLET BY MOUTH TWICE A DAY  . cloNIDine (CATAPRES) 0.1 MG tablet Take 1 tablet (0.1 mg total) by mouth 3 (three) times daily.  . clopidogrel (PLAVIX) 75 MG tablet TAKE 1 TABLET BY MOUTH EVERY DAY  . Docusate Calcium (STOOL SOFTENER PO) Take 1 tablet by mouth daily as needed (constipation).   . fluticasone (FLONASE) 50 MCG/ACT nasal spray Place 1 spray into both nostrils daily as needed for allergies or rhinitis.  Marland Kitchen gabapentin (NEURONTIN) 100 MG capsule Take 1 capsule (100 mg total) by mouth 3 (three) times daily.  . hydrALAZINE (APRESOLINE) 50 MG tablet Take 0.5 tablets (25 mg total) by mouth 3 (three) times daily.  Marland Kitchen leflunomide (ARAVA) 20 MG tablet Take 1 tablet (20 mg total) by mouth daily.  Marland Kitchen levothyroxine (SYNTHROID) 100 MCG tablet TAKE 1 TABLET BY MOUTH EVERY DAY  . loratadine (CLARITIN) 10 MG tablet Take 10 mg by mouth daily as needed for allergies.   Marland Kitchen losartan (COZAAR) 100 MG tablet TAKE 1 TABLET BY MOUTH EVERY DAY  . mycophenolate (CELLCEPT) 500 MG tablet Take 500 mg by mouth daily.  . ondansetron (ZOFRAN) 4 MG tablet Take 1 tablet (4 mg total) by mouth every  8 (eight) hours as needed for nausea or vomiting.  . potassium chloride SA (KLOR-CON) 20 MEQ tablet Take 2 tablets (40 mEq total) by mouth once for 1 dose.  . prednisoLONE acetate (PRED FORTE) 1 % ophthalmic suspension Place 1 drop into both eyes daily.   . predniSONE (DELTASONE) 5 MG tablet Take 5 mg by mouth daily.  Marland Kitchen PROLIA 60 MG/ML SOSY injection Inject 60 mg as directed every 6 (six) months.   Facility-Administered Encounter Medications as of 07/17/2020  Medication  . cyanocobalamin ((VITAMIN B-12)) injection 1,000 mcg    Allergies as of 07/17/2020 - Review Complete 07/12/2020  Allergen Reaction Noted  . Amlodipine Other (See Comments) 06/07/2015  . Iron Nausea And Vomiting 10/10/2015    Past Medical History:  Diagnosis Date  .  Allergy   . Anemia    iron deficiency  . 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 Cataract   . Colon polyps   . CVA (cerebral vascular accident) ([place]) 02/2017  . Depression   . Hyperlipidemia   . Hypertension   . Osteoporosis 01/01/2015  . Rheumatoid arteritis ([place])   . Thyroid disease   . TIA (transient ischemic attack) 2016  . Urinary incontinence     Past Surgical History:  Procedure Laterality Date  . ABDOMINAL HYSTERECTOMY  1976  . CATARACT EXTRACTION Bilateral   . LOWER EXTREMITY ANGIOGRAPHY N/A 08/29/2019   Procedure: LOWER EXTREMITY ANGIOGRAPHY;  Surgeon: Serafina Mitchell, MD;  Location: [facility];  Service: Cardiovascular;  Laterality: N/A;  . PERIPHERAL VASCULAR INTERVENTION Right 08/29/2019   Procedure: PERIPHERAL VASCULAR INTERVENTION;  Surgeon: Serafina Mitchell, MD;  Location: [facility];  Service: Cardiovascular;  Laterality: Right;  . 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 Hypertension Father   . Colon cancer Father   . Ulcerative colitis Mother   . Parkinson's disease Mother   . Heart disease Sister 1       CABG x 3  . Cirrhosis Brother   . Alcohol abuse Brother   . Colitis Daughter   . Colon polyps Daughter   . Heart attack Daughter   . Hypertension Son   . Kidney disease Son        transplant due to kidney problems after [facility]  . Arthritis Other   . Coronary artery disease Other   . Hyperlipidemia Other   . Hypertension Other   . Stroke Sister        died 6  . Arthritis Sister   . Hypertension Sister   . Thyroid disease Daughter        x 3  . Cirrhosis Brother 32       ETOH abuse    Social History   Socioeconomic History  . Marital status: Divorced    Spouse name: Not on file  . Number of children: 8  . Years of education: Not on file  . Highest education level: Not on file  Occupational History    Employer: RETIRED    Comment: Retired from [employer]  . Smoking status: Current Some Day Smoker    Packs/day: 0.50    Years: 32.00    Pack years: 16.00    Types: Cigarettes  . Smokeless tobacco: Never Used  . Tobacco comment: 6 or 7 per day  Vaping Use  . Vaping Use: Never used  Substance and Sexual Activity  . Alcohol use: No    Alcohol/week: 0.0 standard drinks  . Drug use: No  . Sexual activity: Never  Other Topics Concern  . Not on file  Social History Narrative  . Not on file   Social Determinants of Health   Financial Resource Strain:   . Difficulty of Paying Living Expenses: Not on file  Food Insecurity:   . Worried About Charity fundraiser in the Last Year: Not on file  . Ran Out of Food in the Last Year: Not on file  Transportation Needs:   . Lack of Transportation (Medical): Not on file  . Lack of Transportation (Non-Medical): Not on file  Physical Activity:   . Days of Exercise per Week: Not on file  . Minutes of Exercise per Session: Not on file  Stress:   . Feeling of Stress : Not on file  Social Connections:   . Frequency of Communication with Friends and Family: Not on file  . Frequency of Social Gatherings with Friends and Family: Not on file  . Attends Religious Services: Not on file  . Active Member of Clubs or Organizations: Not on file  . Attends Archivist Meetings: Not on file  . Marital Status: Not on file  Intimate Partner Violence:   . Fear of Current or Ex-Partner: Not on file  . Emotionally Abused: Not on file  . Physically Abused: Not on file  . Sexually Abused: Not on file      Review of systems: All other review of systems negative except as mentioned in the HPI.   Physical Exam: Vitals:   07/17/20 1334  BP: (!) 90/50  Pulse: 72   Body mass index is 19.07 kg/m. Gen:      No acute distress HEENT:  sclera anicteric Abd:      soft, non-tender; no palpable masses, no distension Ext:    No edema Neuro: alert and oriented x 3 Psych: normal mood and  affect  Data Reviewed:  Reviewed labs, radiology imaging, old records and pertinent past GI work up   Assessment and Plan/Recommendations:  79 year old very pleasant female with hypothyroidism, hypertension, rheumatoid arthritis on chronic immunosuppressive therapy here for evaluation of elevated lipase, transaminitis, irregular bowel habits with alternating constipation and diarrhea  Mild elevation in lipase, CT abdomen and pelvis with contrast negative for acute pancreatitis ?  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 The patient was provided an opportunity to ask questions and all were answered. The patient agreed with the plan and demonstrated an understanding of the instructions.  Damaris Hippo , MD    CC: Debbrah Alar, NP

## 2020-07-18 ENCOUNTER — Other Ambulatory Visit: Payer: Self-pay | Admitting: Family

## 2020-07-19 ENCOUNTER — Encounter: Payer: Self-pay | Admitting: Gastroenterology

## 2020-07-25 ENCOUNTER — Ambulatory Visit: Payer: Self-pay | Admitting: *Deleted

## 2020-08-09 ENCOUNTER — Encounter: Payer: Self-pay | Admitting: Family

## 2020-08-09 ENCOUNTER — Ambulatory Visit (INDEPENDENT_AMBULATORY_CARE_PROVIDER_SITE_OTHER): Payer: Medicare Other | Admitting: Family

## 2020-08-09 ENCOUNTER — Other Ambulatory Visit: Payer: Self-pay

## 2020-08-09 VITALS — BP 148/56 | HR 66 | Temp 98.5°F | Resp 16 | Ht 63.0 in | Wt 110.0 lb

## 2020-08-09 DIAGNOSIS — E538 Deficiency of other specified B group vitamins: Secondary | ICD-10-CM | POA: Diagnosis not present

## 2020-08-09 DIAGNOSIS — F32A Depression, unspecified: Secondary | ICD-10-CM

## 2020-08-09 DIAGNOSIS — I10 Essential (primary) hypertension: Secondary | ICD-10-CM | POA: Diagnosis not present

## 2020-08-09 MED ORDER — ESCITALOPRAM OXALATE 5 MG PO TABS
5.0000 mg | ORAL_TABLET | Freq: Every day | ORAL | 0 refills | Status: DC
Start: 2020-08-09 — End: 2020-11-04

## 2020-08-09 MED ORDER — CYANOCOBALAMIN 1000 MCG/ML IJ SOLN
1000.0000 ug | Freq: Once | INTRAMUSCULAR | Status: AC
  Administered 2020-08-09: 1000 ug via INTRAMUSCULAR

## 2020-08-09 NOTE — Progress Notes (Signed)
Subjective:    [name], female    DOB: [birth year], 79 y.o.   MRN: [id number]  HPI  Patient is a 79 yr old female who presents today for follow up.   HTN- last visit bp was noted to be elevated. We added clonidine 0.1mg  TID. BP Readings from Last 3 Encounters:  08/09/20 (!) 148/56  07/17/20 (!) 90/50  07/12/20 (!) 186/74   She completed pfizer series.    b12 deficiency- she had stopped b12 injections due to covid and wishes to resume.   She is concerned about depression. Reports difficulty motivating.  COVID pandemic is really wearing on her.  Wants to sleep a lot. She is interested in restarting an antidepressant.    Review of Systems    see HPI  Past Medical History:  Diagnosis Date  . Allergy   . Anemia    iron deficiency  . Cancer ([place]) 1995   vaginal  . Cataract   . Colon polyps   . CVA (cerebral vascular accident) ([place]) 02/2017  . Depression   . Hyperlipidemia   . Hypertension   . Osteoporosis 01/01/2015  . Rheumatoid arteritis ([place])   . Thyroid disease   . TIA (transient ischemic attack) 2016  . Urinary incontinence      Social History   Socioeconomic History  . Marital status: Divorced    Spouse name: Not on file  . Number of children: 8  . Years of education: Not on file  . Highest education level: Not on file  Occupational History  . Occupation: retired    Fish farm manager: RETIRED    Comment: Retired from [employer]  . Smoking status: Current Some Day Smoker    Packs/day: 0.50    Years: 32.00    Pack years: 16.00    Types: Cigarettes  . Smokeless tobacco: Never Used  . Tobacco comment: 6 or 7 per day  Vaping Use  . Vaping Use: Never used  Substance and Sexual Activity  . Alcohol use: No    Alcohol/week: 0.0 standard drinks  . Drug use: No  . Sexual activity: Never  Other Topics Concern  . Not on file  Social History Narrative  . Not on file   Social Determinants of Health   Financial Resource  Strain:   . Difficulty of Paying Living Expenses: Not on file  Food Insecurity:   . Worried About Charity fundraiser in the Last Year: Not on file  . Ran Out of Food in the Last Year: Not on file  Transportation Needs:   . Lack of Transportation (Medical): Not on file  . Lack of Transportation (Non-Medical): Not on file  Physical Activity:   . Days of Exercise per Week: Not on file  . Minutes of Exercise per Session: Not on file  Stress:   . Feeling of Stress : Not on file  Social Connections:   . Frequency of Communication with Friends and Family: Not on file  . Frequency of Social Gatherings with Friends and Family: Not on file  . Attends Religious Services: Not on file  . Active Member of Clubs or Organizations: Not on file  . Attends Archivist Meetings: Not on file  . Marital Status: Not on file  Intimate Partner Violence:   . Fear of Current or Ex-Partner: Not on file  . Emotionally Abused: Not on file  . Physically Abused: Not on file  . Sexually Abused: Not on file  Past Surgical History:  Procedure Laterality Date  . ABDOMINAL HYSTERECTOMY  1976  . CATARACT EXTRACTION Bilateral   . LOWER EXTREMITY ANGIOGRAPHY N/A 08/29/2019   Procedure: LOWER EXTREMITY ANGIOGRAPHY;  Surgeon: Serafina Mitchell, MD;  Location: [facility];  Service: Cardiovascular;  Laterality: N/A;  . PERIPHERAL VASCULAR INTERVENTION Right 08/29/2019   Procedure: PERIPHERAL VASCULAR INTERVENTION;  Surgeon: Serafina Mitchell, MD;  Location: [facility];  Service: Cardiovascular;  Laterality: Right;  . 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 Hypertension Father   . Colon cancer Father   . Ulcerative colitis Mother   . Parkinson's disease Mother   . Heart disease Sister 25       CABG x 3  . Cirrhosis Brother   . Alcohol abuse Brother   . Colitis Daughter   . Colon polyps Daughter   . Heart attack Daughter   .  Hypertension Son   . Kidney disease Son        transplant due to kidney problems after [facility]  . Arthritis Other   . Coronary artery disease Other   . Hyperlipidemia Other   . Hypertension Other   . Stroke Sister        died 33  . Arthritis Sister   . Hypertension Sister   . Thyroid disease Daughter        x 3  . Cirrhosis Brother 65       ETOH abuse    Allergies  Allergen Reactions  . Iron Nausea And Vomiting    Can take infusions   . Norvasc [Amlodipine] Other (See Comments)    dizziness    Current Outpatient Medications on File Prior to Visit  Medication Sig Dispense Refill  . Adalimumab (HUMIRA PEN) 40 MG/0.4ML PNKT Inject 40 mg into the skin once a week. Saturday    . carvedilol (COREG) 25 MG tablet TAKE 1 TABLET BY MOUTH TWICE A DAY 180 tablet 1  . cloNIDine (CATAPRES) 0.1 MG tablet Take 1 tablet (0.1 mg total) by mouth 3 (three) times daily. 60 tablet 3  . clopidogrel (PLAVIX) 75 MG tablet TAKE 1 TABLET BY MOUTH EVERY DAY 90 tablet 1  . Docusate Calcium (STOOL SOFTENER PO) Take 1 tablet by mouth daily as needed (constipation).     . fluticasone (FLONASE) 50 MCG/ACT nasal spray Place 1 spray into both nostrils daily as needed for allergies or rhinitis.    Marland Kitchen gabapentin (NEURONTIN) 100 MG capsule Take 1 capsule (100 mg total) by mouth 3 (three) times daily. 90 capsule 3  . leflunomide (ARAVA) 20 MG tablet Take 1 tablet (20 mg total) by mouth daily.    Marland Kitchen levothyroxine (SYNTHROID) 100 MCG tablet TAKE 1 TABLET BY MOUTH EVERY DAY 90 tablet 1  . loperamide (IMODIUM) 2 MG capsule Take 2 mg by mouth as needed for diarrhea or loose stools.    Marland Kitchen losartan (COZAAR) 100 MG tablet TAKE 1 TABLET BY MOUTH EVERY DAY 90 tablet 1  . mycophenolate (CELLCEPT) 500 MG tablet Take 500 mg by mouth daily.    . ondansetron (ZOFRAN) 4 MG tablet Take 1 tablet (4 mg total) by mouth every 8 (eight) hours as needed for nausea or vomiting. 20 tablet 0  . prednisoLONE acetate (PRED FORTE) 1 %  ophthalmic suspension Place 1 drop into both eyes daily.     . predniSONE (DELTASONE) 5 MG  tablet Take 5 mg by mouth daily.    Marland Kitchen PROLIA 60 MG/ML SOSY injection Inject 60 mg as directed every 6 (six) months.    . potassium chloride SA (KLOR-CON) 20 MEQ tablet Take 2 tablets (40 mEq total) by mouth once for 1 dose. 2 tablet 0   Current Facility-Administered Medications on File Prior to Visit  Medication Dose Route Frequency Provider Last Rate Last Admin  . cyanocobalamin ((VITAMIN B-12)) injection 1,000 mcg  1,000 mcg Intramuscular Q30 days Debbrah Alar, NP   1,000 mcg at 01/05/20 1109    BP (!) 148/56 (BP Location: Right Arm, Patient Position: Sitting, Cuff Size: Small)   Pulse 66   Temp 98.5 F (36.9 C) (Oral)   Resp 16   Ht 5\' 3"  (1.6 m)   Wt 110 lb (49.9 kg)   BMI 19.49 kg/m    Objective:   Physical Exam Constitutional:      Appearance: She is well-developed.  Neck:     Thyroid: No thyromegaly.  Cardiovascular:     Rate and Rhythm: Normal rate and regular rhythm.     Heart sounds: Normal heart sounds. No murmur heard.   Pulmonary:     Effort: Pulmonary effort is normal. No respiratory distress.     Breath sounds: Normal breath sounds. No wheezing.  Musculoskeletal:     Cervical back: Neck supple.  Skin:    General: Skin is warm and dry.  Neurological:     Mental Status: She is alert and oriented to person, place, and time.  Psychiatric:        Behavior: Behavior normal.        Thought Content: Thought content normal.        Judgment: Judgment normal.           Assessment & Plan:  HTN- bp looks improved today. Continue clonidine 0.1mg  tid, coreg 25mg  bid, losartan 100mg  once daily. (bmet next vist).  Depression- uncontrolled. Will restart lexapro 5mg  once daily. I also gave her information on counseling services.   b12 deficiency- restart b12 injections once monthly.  This visit occurred during the SARS-CoV-2 public health emergency.  Safety protocols  were in place, including screening questions prior to the visit, additional usage of staff PPE, and extensive cleaning of exam room while observing appropriate contact time as indicated for disinfecting solutions.

## 2020-08-09 NOTE — Patient Instructions (Signed)
Please add lexapro 5mg  once daily.

## 2020-08-09 NOTE — Addendum Note (Signed)
Addended by: Jiles Prows on: 08/09/2020 02:27 PM   Modules accepted: Orders

## 2020-08-14 ENCOUNTER — Telehealth: Payer: Self-pay | Admitting: Family

## 2020-08-14 NOTE — Progress Notes (Signed)
  Chronic Care Management   Outreach Note  08/14/2020 Name: Nancy [name] MRN: [id number] DOB: 20-Sep-1941  Referred by: Debbrah Alar, NP Reason for referral : No chief complaint on file.   An unsuccessful telephone outreach was attempted today. The patient was referred to the pharmacist for assistance with care management and care coordination.   Follow Up Plan:   Carley Perdue [employer] Scheduler

## 2020-08-20 ENCOUNTER — Telehealth: Payer: Self-pay | Admitting: Family

## 2020-08-20 NOTE — Progress Notes (Signed)
  Chronic Care Management   Note  08/20/2020 Name: Nancy [name] MRN: [id number] DOB: 11/18/1940  Nancy [name] is a 79 y.o. year old female who is a primary care patient of Debbrah Alar, NP. I reached out to Conrad [place] by phone today in response to a referral sent by Ms. Everlene Other PCP, Debbrah Alar, NP.   [name] was given information about Chronic Care Management services today including:  1. CCM service includes personalized support from designated clinical staff supervised by her physician, including individualized plan of care and coordination with other care providers 2. 24/7 contact phone numbers for assistance for urgent and routine care needs. 3. Service will only be billed when office clinical staff spend 20 minutes or more in a month to coordinate care. 4. Only one practitioner may furnish and bill the service in a calendar month. 5. The patient may stop CCM services at any time (effective at the end of the month) by phone call to the office staff.   Patient agreed to services and verbal consent obtained.   Follow up plan:   Carley Perdue [employer] Scheduler

## 2020-09-09 ENCOUNTER — Other Ambulatory Visit: Payer: Self-pay

## 2020-09-09 ENCOUNTER — Ambulatory Visit (INDEPENDENT_AMBULATORY_CARE_PROVIDER_SITE_OTHER): Payer: Medicare Other | Admitting: Family

## 2020-09-09 ENCOUNTER — Encounter: Payer: Self-pay | Admitting: Family

## 2020-09-09 VITALS — BP 161/53 | HR 65 | Temp 99.0°F | Resp 16 | Ht 62.0 in | Wt 111.2 lb

## 2020-09-09 DIAGNOSIS — F32A Depression, unspecified: Secondary | ICD-10-CM | POA: Diagnosis not present

## 2020-09-09 DIAGNOSIS — E538 Deficiency of other specified B group vitamins: Secondary | ICD-10-CM | POA: Diagnosis not present

## 2020-09-09 DIAGNOSIS — R55 Syncope and collapse: Secondary | ICD-10-CM

## 2020-09-09 DIAGNOSIS — I10 Essential (primary) hypertension: Secondary | ICD-10-CM | POA: Diagnosis not present

## 2020-09-09 MED ORDER — CYANOCOBALAMIN 1000 MCG/ML IJ SOLN
1000.0000 ug | Freq: Once | INTRAMUSCULAR | Status: AC
  Administered 2020-09-09: 1000 ug via INTRAMUSCULAR

## 2020-09-09 MED ORDER — HYDRALAZINE HCL 25 MG PO TABS
25.0000 mg | ORAL_TABLET | Freq: Three times a day (TID) | ORAL | 5 refills | Status: DC
Start: 2020-09-09 — End: 2021-03-07

## 2020-09-09 NOTE — Patient Instructions (Signed)
Please complete lab work prior to leaving.   
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
## 2020-09-10 DIAGNOSIS — H35033 Hypertensive retinopathy, bilateral: Secondary | ICD-10-CM | POA: Diagnosis not present

## 2020-09-10 DIAGNOSIS — M05741 Rheumatoid arthritis with rheumatoid factor of right hand without organ or systems involvement: Secondary | ICD-10-CM | POA: Diagnosis not present

## 2020-09-10 DIAGNOSIS — Z961 Presence of intraocular lens: Secondary | ICD-10-CM | POA: Diagnosis not present

## 2020-09-10 DIAGNOSIS — M05742 Rheumatoid arthritis with rheumatoid factor of left hand without organ or systems involvement: Secondary | ICD-10-CM | POA: Diagnosis not present

## 2020-09-10 DIAGNOSIS — H30033 Focal chorioretinal inflammation, peripheral, bilateral: Secondary | ICD-10-CM | POA: Diagnosis not present

## 2020-09-10 LAB — BASIC METABOLIC PANEL
BUN: 12 mg/dL (ref 7–25)
CO2: 27 mmol/L (ref 20–32)
Calcium: 9.7 mg/dL (ref 8.6–10.4)
Chloride: 107 mmol/L (ref 98–110)
Creat: 0.85 mg/dL (ref 0.60–0.93)
Glucose, Bld: 107 mg/dL — ABNORMAL HIGH (ref 65–99)
Potassium: 4.6 mmol/L (ref 3.5–5.3)
Sodium: 143 mmol/L (ref 135–146)

## 2020-09-18 DIAGNOSIS — H15042 Scleritis with corneal involvement, left eye: Secondary | ICD-10-CM | POA: Diagnosis not present

## 2020-09-18 DIAGNOSIS — R350 Frequency of micturition: Secondary | ICD-10-CM | POA: Diagnosis not present

## 2020-09-18 DIAGNOSIS — R5383 Other fatigue: Secondary | ICD-10-CM | POA: Diagnosis not present

## 2020-09-18 DIAGNOSIS — Z79899 Other long term (current) drug therapy: Secondary | ICD-10-CM | POA: Diagnosis not present

## 2020-09-18 DIAGNOSIS — M0579 Rheumatoid arthritis with rheumatoid factor of multiple sites without organ or systems involvement: Secondary | ICD-10-CM | POA: Diagnosis not present

## 2020-09-18 DIAGNOSIS — M170 Bilateral primary osteoarthritis of knee: Secondary | ICD-10-CM | POA: Diagnosis not present

## 2020-09-18 DIAGNOSIS — M2550 Pain in unspecified joint: Secondary | ICD-10-CM | POA: Diagnosis not present

## 2020-09-23 ENCOUNTER — Ambulatory Visit: Payer: Medicare Other | Admitting: Gastroenterology

## 2020-09-26 ENCOUNTER — Ambulatory Visit: Payer: Medicare Other

## 2020-10-09 ENCOUNTER — Ambulatory Visit (INDEPENDENT_AMBULATORY_CARE_PROVIDER_SITE_OTHER): Payer: Medicare Other

## 2020-10-09 ENCOUNTER — Other Ambulatory Visit: Payer: Self-pay

## 2020-10-09 DIAGNOSIS — E538 Deficiency of other specified B group vitamins: Secondary | ICD-10-CM

## 2020-10-09 MED ORDER — CYANOCOBALAMIN 1000 MCG/ML IJ SOLN
1000.0000 ug | Freq: Once | INTRAMUSCULAR | Status: AC
Start: 2020-10-09 — End: 2020-10-09
  Administered 2020-10-09: 1000 ug via INTRAMUSCULAR

## 2020-10-09 NOTE — Progress Notes (Addendum)
Pt here for monthly B12 injection per   B12 1072mcg given IM, and pt tolerated injection well.  Next B12 injection scheduled for 11/06/20

## 2020-10-17 ENCOUNTER — Other Ambulatory Visit: Payer: Self-pay

## 2020-10-17 ENCOUNTER — Ambulatory Visit (INDEPENDENT_AMBULATORY_CARE_PROVIDER_SITE_OTHER): Payer: Medicare Other

## 2020-10-17 VITALS — BP 148/70 | HR 62 | Temp 98.4°F | Resp 16 | Ht 63.0 in | Wt 112.2 lb

## 2020-10-17 DIAGNOSIS — Z0000 Encounter for general adult medical examination without abnormal findings: Secondary | ICD-10-CM | POA: Diagnosis not present

## 2020-10-17 DIAGNOSIS — Z1231 Encounter for screening mammogram for malignant neoplasm of breast: Secondary | ICD-10-CM | POA: Diagnosis not present

## 2020-10-17 NOTE — Patient Instructions (Signed)
[name] , Thank you for taking time to come for your Medicare Wellness Visit. I appreciate your ongoing commitment to your health goals. Please review the following plan we discussed and let me know if I can assist you in the future.   Screening recommendations/referrals: Colonoscopy: No longer required Mammogram: Ordered today. Someone will be calling you to schedule. Bone Density: Completed 04/19/2020. Due-04/19/2022 Recommended yearly ophthalmology/optometry visit for glaucoma screening and checkup Recommended yearly dental visit for hygiene and checkup  Vaccinations: Influenza vaccine: Up to date Pneumococcal vaccine: Completed vaccines Tdap vaccine: Up to date-Due-12/26/2024 Shingles vaccine: Discuss with pharmacy   Covid-19:Completed vaccines  Advanced directives: Information given today.  Conditions/risks identified: See problem list  Next appointment: Follow up in one year for your annual wellness visit    Preventive Care 65 Years and Older, Female Preventive care refers to lifestyle choices and visits with your health care provider that can promote health and wellness. What does preventive care include?  A yearly physical exam. This is also called an annual well check.  Dental exams once or twice a year.  Routine eye exams. Ask your health care provider how often you should have your eyes checked.  Personal lifestyle choices, including:  Daily care of your teeth and gums.  Regular physical activity.  Eating a healthy diet.  Avoiding tobacco and drug use.  Limiting alcohol use.  Practicing safe sex.  Taking low-dose aspirin every day.  Taking vitamin and mineral supplements as recommended by your health care provider. What happens during an annual well check? The services and screenings done by your health care provider during your annual well check will depend on your age, overall health, lifestyle risk factors, and family history of disease. Counseling   Your health care provider may ask you questions about your:  Alcohol use.  Tobacco use.  Drug use.  Emotional well-being.  Home and relationship well-being.  Sexual activity.  Eating habits.  History of falls.  Memory and ability to understand (cognition).  Work and work Statistician.  Reproductive health. Screening  You may have the following tests or measurements:  Height, weight, and BMI.  Blood pressure.  Lipid and cholesterol levels. These may be checked every 5 years, or more frequently if you are over 85 years old.  Skin check.  Lung cancer screening. You may have this screening every year starting at age 4 if you have a 30-pack-year history of smoking and currently smoke or have quit within the past 15 years.  Fecal occult blood test (FOBT) of the stool. You may have this test every year starting at age 9.  Flexible sigmoidoscopy or colonoscopy. You may have a sigmoidoscopy every 5 years or a colonoscopy every 10 years starting at age 41.  Hepatitis C blood test.  Hepatitis B blood test.  Sexually transmitted disease (STD) testing.  Diabetes screening. This is done by checking your blood sugar (glucose) after you have not eaten for a while (fasting). You may have this done every 1-3 years.  Bone density scan. This is done to screen for osteoporosis. You may have this done starting at age 11.  Mammogram. This may be done every 1-2 years. Talk to your health care provider about how often you should have regular mammograms. Talk with your health care provider about your test results, treatment options, and if necessary, the need for more tests. Vaccines  Your health care provider may recommend certain vaccines, such as:  Influenza vaccine. This is recommended every year.  Tetanus, diphtheria, and acellular pertussis (Tdap, Td) vaccine. You may need a Td booster every 10 years.  Zoster vaccine. You may need this after age 43.  Pneumococcal 13-valent  conjugate (PCV13) vaccine. One dose is recommended after age 68.  Pneumococcal polysaccharide (PPSV23) vaccine. One dose is recommended after age 17. Talk to your health care provider about which screenings and vaccines you need and how often you need them. This information is not intended to replace advice given to you by your health care provider. Make sure you discuss any questions you have with your health care provider. Document Released: 11/22/2015 Document Revised: 07/15/2016 Document Reviewed: 08/27/2015 Elsevier Interactive Patient Education  2017 [place] Prevention in the Home Falls can cause injuries. They can happen to people of all ages. There are many things you can do to make your home safe and to help prevent falls. What can I do on the outside of my home?  Regularly fix the edges of walkways and driveways and fix any cracks.  Remove anything that might make you trip as you walk through a door, such as a raised step or threshold.  Trim any bushes or trees on the path to your home.  Use bright outdoor lighting.  Clear any walking paths of anything that might make someone trip, such as rocks or tools.  Regularly check to see if handrails are loose or broken. Make sure that both sides of any steps have handrails.  Any raised decks and porches should have guardrails on the edges.  Have any leaves, snow, or ice cleared regularly.  Use sand or salt on walking paths during winter.  Clean up any spills in your garage right away. This includes oil or grease spills. What can I do in the bathroom?  Use night lights.  Install grab bars by the toilet and in the tub and shower. Do not use towel bars as grab bars.  Use non-skid mats or decals in the tub or shower.  If you need to sit down in the shower, use a plastic, non-slip stool.  Keep the floor dry. Clean up any water that spills on the floor as soon as it happens.  Remove soap buildup in the tub or  shower regularly.  Attach bath mats securely with double-sided non-slip rug tape.  Do not have throw rugs and other things on the floor that can make you trip. What can I do in the bedroom?  Use night lights.  Make sure that you have a light by your bed that is easy to reach.  Do not use any sheets or blankets that are too big for your bed. They should not hang down onto the floor.  Have a firm chair that has side arms. You can use this for support while you get dressed.  Do not have throw rugs and other things on the floor that can make you trip. What can I do in the kitchen?  Clean up any spills right away.  Avoid walking on wet floors.  Keep items that you use a lot in easy-to-reach places.  If you need to reach something above you, use a strong step stool that has a grab bar.  Keep electrical cords out of the way.  Do not use floor polish or wax that makes floors slippery. If you must use wax, use non-skid floor wax.  Do not have throw rugs and other things on the floor that can make you trip. What can I do  with my stairs?  Do not leave any items on the stairs.  Make sure that there are handrails on both sides of the stairs and use them. Fix handrails that are broken or loose. Make sure that handrails are as long as the stairways.  Check any carpeting to make sure that it is firmly attached to the stairs. Fix any carpet that is loose or worn.  Avoid having throw rugs at the top or bottom of the stairs. If you do have throw rugs, attach them to the floor with carpet tape.  Make sure that you have a light switch at the top of the stairs and the bottom of the stairs. If you do not have them, ask someone to add them for you. What else can I do to help prevent falls?  Wear shoes that:  Do not have high heels.  Have rubber bottoms.  Are comfortable and fit you well.  Are closed at the toe. Do not wear sandals.  If you use a stepladder:  Make sure that it is fully  opened. Do not climb a closed stepladder.  Make sure that both sides of the stepladder are locked into place.  Ask someone to hold it for you, if possible.  Clearly mark and make sure that you can see:  Any grab bars or handrails.  First and last steps.  Where the edge of each step is.  Use tools that help you move around (mobility aids) if they are needed. These include:  Canes.  Walkers.  Scooters.  Crutches.  Turn on the lights when you go into a dark area. Replace any light bulbs as soon as they burn out.  Set up your furniture so you have a clear path. Avoid moving your furniture around.  If any of your floors are uneven, fix them.  If there are any pets around you, be aware of where they are.  Review your medicines with your doctor. Some medicines can make you feel dizzy. This can increase your chance of falling. Ask your doctor what other things that you can do to help prevent falls. This information is not intended to replace advice given to you by your health care provider. Make sure you discuss any questions you have with your health care provider. Document Released: 08/22/2009 Document Revised: 04/02/2016 Document Reviewed: 11/30/2014 Elsevier Interactive Patient Education  2017 [employer].

## 2020-10-17 NOTE — Progress Notes (Addendum)
Subjective:   Nancy [name] is a 79 y.o. female who presents for Medicare Annual (Subsequent) preventive examination.  Review of Systems     Cardiac Risk Factors include: advanced age (>69men, >67 women);hypertension;dyslipidemia;smoking/ tobacco exposure     Objective:    Today's Vitals   10/17/20 1517  BP: (!) 148/70  Pulse: 62  Resp: 16  Temp: 98.4 F (36.9 C)  TempSrc: Oral  SpO2: 98%  Weight: 112 lb 3.2 oz (50.9 kg)  Height: 5\' 3"  (1.6 m)   Body mass index is 19.88 kg/m.  Advanced Directives 10/17/2020 10/09/2019 08/29/2019 07/31/2019 07/25/2019 06/06/2019 04/24/2019  Does Patient Have a Medical Advance Directive? No Yes Yes Yes Yes No No  Type of Advance Directive - [place];Living will [place];Living will [place];Living will [place];Living will - -  Does patient want to make changes to medical advance directive? Yes (MAU/Ambulatory/Procedural Areas - Information given) No - Patient declined No - Patient declined No - Patient declined No - Patient declined - -  Copy of [place] in Chart? - - No - copy requested - No - copy requested - -  Would patient like information on creating a medical advance directive? Yes (MAU/Ambulatory/Procedural Areas - Information given) - - - - - No - Patient declined    Current Medications (verified) Outpatient Encounter Medications as of 10/17/2020  Medication Sig  . Adalimumab 40 MG/0.4ML PNKT Inject 40 mg into the skin once a week. Saturday  . carvedilol (COREG) 25 MG tablet TAKE 1 TABLET BY MOUTH TWICE A DAY  . cloNIDine (CATAPRES) 0.1 MG tablet Take 1 tablet (0.1 mg total) by mouth 3 (three) times daily.  . clopidogrel (PLAVIX) 75 MG tablet TAKE 1 TABLET BY MOUTH EVERY DAY  . Docusate Calcium (STOOL SOFTENER PO) Take 1 tablet by mouth daily as needed (constipation).   Marland Kitchen escitalopram (LEXAPRO) 5 MG tablet Take 1 tablet (5 mg total) by  mouth daily.  . fluticasone (FLONASE) 50 MCG/ACT nasal spray Place 1 spray into both nostrils daily as needed for allergies or rhinitis.  Marland Kitchen gabapentin (NEURONTIN) 100 MG capsule Take 1 capsule (100 mg total) by mouth 3 (three) times daily.  . hydrALAZINE (APRESOLINE) 25 MG tablet Take 1 tablet (25 mg total) by mouth 3 (three) times daily.  Marland Kitchen leflunomide (ARAVA) 20 MG tablet Take 1 tablet (20 mg total) by mouth daily.  Marland Kitchen levothyroxine (SYNTHROID) 100 MCG tablet TAKE 1 TABLET BY MOUTH EVERY DAY  . loperamide (IMODIUM) 2 MG capsule Take 2 mg by mouth as needed for diarrhea or loose stools.  Marland Kitchen losartan (COZAAR) 100 MG tablet TAKE 1 TABLET BY MOUTH EVERY DAY  . mycophenolate (CELLCEPT) 500 MG tablet Take 500 mg by mouth daily.  . prednisoLONE acetate (PRED FORTE) 1 % ophthalmic suspension Place 1 drop into both eyes daily.   . predniSONE (DELTASONE) 5 MG tablet Take 5 mg by mouth daily.  Marland Kitchen PROLIA 60 MG/ML SOSY injection Inject 60 mg as directed every 6 (six) months.  . ondansetron (ZOFRAN) 4 MG tablet Take 1 tablet (4 mg total) by mouth every 8 (eight) hours as needed for nausea or vomiting. (Patient not taking: Reported on 10/17/2020)  . potassium chloride SA (KLOR-CON) 20 MEQ tablet Take 2 tablets (40 mEq total) by mouth once for 1 dose.   Facility-Administered Encounter Medications as of 10/17/2020  Medication  . cyanocobalamin ((VITAMIN B-12)) injection 1,000 mcg  Allergies (verified) Iron and Norvasc [amlodipine]   History: Past Medical History:  Diagnosis Date  . Allergy   . Anemia    iron deficiency  . 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 Cataract   . Colon polyps   . CVA (cerebral vascular accident) ([place]) 02/2017  . Depression   . Hyperlipidemia   . Hypertension   . Osteoporosis 01/01/2015  . Rheumatoid arteritis ([place])   . Thyroid disease   . TIA (transient ischemic attack) 2016  . Urinary incontinence    Past Surgical History:  Procedure Laterality Date  . ABDOMINAL  HYSTERECTOMY  1976  . CATARACT EXTRACTION Bilateral   . LOWER EXTREMITY ANGIOGRAPHY N/A 08/29/2019   Procedure: LOWER EXTREMITY ANGIOGRAPHY;  Surgeon: Serafina Mitchell, MD;  Location: [facility];  Service: Cardiovascular;  Laterality: N/A;  . PERIPHERAL VASCULAR INTERVENTION Right 08/29/2019   Procedure: PERIPHERAL VASCULAR INTERVENTION;  Surgeon: Serafina Mitchell, MD;  Location: [facility];  Service: Cardiovascular;  Laterality: Right;  . TUMOR REMOVAL  last was 1976   left ankle x3   Family History  Problem Relation Age of Onset  . Hypertension Father   . Colon cancer Father   . Ulcerative colitis Mother   . Parkinson's disease Mother   . Heart disease Sister 58       CABG x 3  . Cirrhosis Brother   . Alcohol abuse Brother   . Colitis Daughter   . Colon polyps Daughter   . Heart attack Daughter   . Hypertension Son   . Kidney disease Son        transplant due to kidney problems after [facility]  . Arthritis Other   . Coronary artery disease Other   . Hyperlipidemia Other   . Hypertension Other   . Stroke Sister        died 88  . Arthritis Sister   . Hypertension Sister   . Thyroid disease Daughter        x 3  . Cirrhosis Brother 45       ETOH abuse   Social History   Socioeconomic History  . Marital status: Divorced    Spouse name: Not on file  . Number of children: 8  . Years of education: Not on file  . Highest education level: Not on file  Occupational History  . Occupation: retired    Fish farm manager: RETIRED    Comment: Retired from [employer]  . Smoking status: Current Some Day Smoker    Packs/day: 0.50    Years: 32.00    Pack years: 16.00    Types: Cigarettes  . Smokeless tobacco: Never Used  . Tobacco comment: 6 or 7 per day  Vaping Use  . Vaping Use: Never used  Substance and Sexual Activity  . Alcohol use: No    Alcohol/week: 0.0 standard drinks  . Drug use: No  . Sexual activity: Never  Other Topics Concern   . Not on file  Social History Narrative   Married for 30 years.     8 children   Social Determinants of Health   Financial Resource Strain: Low Risk   . Difficulty of Paying Living Expenses: Not hard at all  Food Insecurity: No Food Insecurity  . Worried About Charity fundraiser in the Last Year: Never true  . Ran Out of Food in the Last Year: Never true  Transportation Needs: No Transportation Needs  . Lack of  Transportation (Medical): No  . Lack of Transportation (Non-Medical): No  Physical Activity: Insufficiently Active  . Days of Exercise per Week: 2 days  . Minutes of Exercise per Session: 30 min  Stress: No Stress Concern Present  . Feeling of Stress : Not at all  Social Connections: Moderately Isolated  . Frequency of Communication with Friends and Family: More than three times a week  . Frequency of Social Gatherings with Friends and Family: More than three times a week  . Attends Religious Services: More than 4 times per year  . Active Member of Clubs or Organizations: No  . Attends Archivist Meetings: Never  . Marital Status: Divorced    Tobacco Counseling Ready to quit: Not Answered Counseling given: Not Answered Comment: 6 or 7 per day   Clinical Intake:  Pre-visit preparation completed: Yes  Pain : No/denies pain     Nutritional Status: BMI of 19-24  Normal Nutritional Risks: None Diabetes: No  How often do you need to have someone help you when you read instructions, pamphlets, or other written materials from your doctor or pharmacy?: 1 - Never What is the last grade level you completed in school?: 8th grade  Diabetic?No  Interpreter Needed?: No  Information entered by :: Caroleen Hamman LPN   Activities of Daily Living In your present state of health, do you have any difficulty performing the following activities: 10/17/2020 08/09/2020  Hearing? N N  Vision? N N  Difficulty concentrating or making decisions? Y N  Comment  occasionally -  Walking or climbing stairs? N N  Dressing or bathing? N N  Doing errands, shopping? N N  Preparing Food and eating ? N -  Using the Toilet? N -  In the past six months, have you accidently leaked urine? Y -  Comment wears depends -  Do you have problems with loss of bowel control? N -  Managing your Medications? N -  Managing your Finances? N -  Housekeeping or managing your Housekeeping? N -  Some recent data might be hidden    Patient Care Team: Debbrah Alar, NP as PCP - General Phadke, Karsten Ro, MD as Consulting Physician (Endocrinology) [place] Crane, MD as Referring Physician (Rheumatology) Bo Merino, MD as Consulting Physician (Rheumatology) Day, Melvenia Beam, [facility] as Pharmacist (Pharmacist)  Indicate any recent Medical Services you may have received from other than [facility] providers in the past year (date may be approximate).     Assessment:   This is a routine wellness examination for [place].  Hearing/Vision screen  Hearing Screening   125Hz  250Hz  500Hz  1000Hz  2000Hz  3000Hz  4000Hz  6000Hz  8000Hz   Right ear:           Left ear:           Comments: No issues  Vision Screening Comments: Last eye exam- 3 months ago Dr. Katy Fitch  Dietary issues and exercise activities discussed: Current Exercise Habits: Home exercise routine, Type of exercise: Other - see comments (chair aerobics), Time (Minutes): 30, Frequency (Times/Week): 2, Weekly Exercise (Minutes/Week): 60, Intensity: Mild, Exercise limited by: Other - see comments (balance issues)  Goals    . Patient Stated     Drink more water    . renew strength and balance      Depression Screen PHQ 2/9 Scores 10/17/2020 08/09/2020 07/25/2019 07/21/2018 07/19/2017 06/18/2017 06/18/2017  PHQ - 2 Score 0 1 1 1 1 3 1   PHQ- 9 Score - - - - 4 13 -  Fall Risk Fall Risk  10/17/2020 08/09/2020 07/25/2019 07/21/2018 04/11/2018  Falls in the past year? 0 0 0 No No  Number falls in past yr: 0 0 - - -  Injury with Fall?  0 0 - - -  Follow up Falls prevention discussed - - - -    FALL RISK PREVENTION PERTAINING TO THE HOME:  Any stairs in or around the home? No  Home free of loose throw rugs in walkways, pet beds, electrical cords, etc? Yes  Adequate lighting in your home to reduce risk of falls? Yes   ASSISTIVE DEVICES UTILIZED TO PREVENT FALLS:  Life alert? No  Use of a cane, walker or w/c? No  Grab bars in the bathroom? Yes  Shower chair or bench in shower? Yes  Elevated toilet seat or a handicapped toilet? No   TIMED UP AND GO:  Was the test performed? Yes .  Length of time to ambulate 10 feet: 11 sec.   Gait slow and steady without use of assistive device  Cognitive Function:Normal cognitive status assessed by direct observation by this Nurse Health Advisor. No abnormalities found. Patient states she occasionally forgets things. She has an upcoming appt in January with a neurologist.   [place] Exam 07/21/2018 07/19/2017  Orientation to time 5 5  Orientation to Place 5 5  Registration 3 3  Attention/ Calculation 5 5  Recall 3 2  Language- name 2 objects 2 2  Language- repeat 1 0  Language- follow 3 step command 3 3  Language- read & follow direction 1 1  Write a sentence 1 1  Copy design 1 1  Total score 30 28     6CIT Screen 07/25/2019  What Year? 0 points  What month? 0 points  What time? 0 points  Count back from 20 0 points  Months in reverse 0 points  Repeat phrase 4 points  Total Score 4    Immunizations Immunization History  Administered Date(s) Administered  . Fluad Quad(high Dose 65+) 08/01/2019, 08/06/2020  . Influenza Split 10/16/2011  . Influenza Whole 09/10/2009, 08/18/2010  . Influenza, High Dose Seasonal PF 09/10/2015, 08/31/2016, 07/19/2017, 08/03/2018  . Influenza,inj,Quad PF,6+ Mos 09/05/2014  . Influenza,inj,quad, With Preservative 07/19/2017  . Influenza-Unspecified 08/12/2012, 08/09/2013  . PFIZER SARS-COV-2 Vaccination 12/04/2019,  12/25/2019, 08/16/2020  . Pneumococcal Conjugate-13 09/05/2014  . Pneumococcal Polysaccharide-23 01/01/2009  . Td 12/26/2014    TDAP status: Up to date  Flu Vaccine status: Up to date  Pneumococcal vaccine status: Up to date  Covid-19 vaccine status: Completed vaccines  Qualifies for Shingles Vaccine? Yes   Zostavax completed No   Shingrix Completed?: No.    Education has been provided regarding the importance of this vaccine. Patient has been advised to call insurance company to determine out of pocket expense if they have not yet received this vaccine. Advised may also receive vaccine at local pharmacy or Health Dept. Verbalized acceptance and understanding.  Screening Tests Health Maintenance  Topic Date Due  . Hepatitis C Screening  Never done  . COLONOSCOPY  10/22/2020  . TETANUS/TDAP  12/26/2024  . INFLUENZA VACCINE  Completed  . DEXA SCAN  Completed  . COVID-19 Vaccine  Completed  . PNA vac Low Risk Adult  Completed    Health Maintenance  Health Maintenance Due  Topic Date Due  . Hepatitis C Screening  Never done    Colorectal cancer screening: No longer required.   Mammogram status: Ordered today. Pt provided with  contact info and advised to call to schedule appt.    Bone Density status: Completed 04/19/2020. Results reflect: Bone density results: OSTEOPOROSIS. Repeat every 2 years.  Lung Cancer Screening: (Low Dose CT Chest recommended if Age 52-80 years, 30 pack-year currently smoking OR have quit w/in 15years.) does not qualify.    Additional Screening:  Hepatitis C Screening: does qualify; Discuss with PCP  Vision Screening: Recommended annual ophthalmology exams for early detection of glaucoma and other disorders of the eye. Is the patient up to date with their annual eye exam?  Yes  Who is the provider or what is the name of the office in which the patient attends annual eye exams? Dr. Katy Fitch   Dental Screening: Recommended annual dental exams for  proper oral hygiene  Community Resource Referral / Chronic Care Management: CRR required this visit?  No   CCM required this visit?  No      Plan:     I have personally reviewed and noted the following in the patient's chart:   . Medical and social history . Use of alcohol, tobacco or illicit drugs  . Current medications and supplements . Functional ability and status . Nutritional status . Physical activity . Advanced directives . List of other physicians . Hospitalizations, surgeries, and ER visits in previous 12 months . Vitals . Screenings to include cognitive, depression, and falls . Referrals and appointments  In addition, I have reviewed and discussed with patient certain preventive protocols, quality metrics, and best practice recommendations. A written personalized care plan for preventive services as well as general preventive health recommendations were provided to patient.   Patient to access avs via mychart.  Marta Antu, LPN   [id number]  Nurse Health Advisor  Nurse Notes: None  Medical screening examination/treatment was performed by qualified clinical staff member and as supervising physician I was immediately available for consultation/collaboration. I have reviewed documentation and agree with assessment and plan.  Penni Homans, MD

## 2020-10-18 ENCOUNTER — Other Ambulatory Visit: Payer: Self-pay

## 2020-10-18 DIAGNOSIS — I10 Essential (primary) hypertension: Secondary | ICD-10-CM

## 2020-10-18 DIAGNOSIS — E785 Hyperlipidemia, unspecified: Secondary | ICD-10-CM

## 2020-10-22 ENCOUNTER — Telehealth: Payer: Self-pay | Admitting: Pharmacist

## 2020-10-22 NOTE — Progress Notes (Addendum)
Chronic Care Management Pharmacy Assistant   Name: LYNORE COSCIA  MRN: [id number] DOB: July 12, 1941  Reason for Encounter: Initial Questions   PCP : Debbrah Alar, NP  Allergies:   Allergies  Allergen Reactions   Iron Nausea And Vomiting    Can take infusions    Norvasc [Amlodipine] Other (See Comments)    dizziness    Medications: Outpatient Encounter Medications as of 10/22/2020  Medication Sig   Adalimumab 40 MG/0.4ML PNKT Inject 40 mg into the skin once a week. Saturday   carvedilol (COREG) 25 MG tablet TAKE 1 TABLET BY MOUTH TWICE A DAY   cloNIDine (CATAPRES) 0.1 MG tablet Take 1 tablet (0.1 mg total) by mouth 3 (three) times daily.   clopidogrel (PLAVIX) 75 MG tablet TAKE 1 TABLET BY MOUTH EVERY DAY   Docusate Calcium (STOOL SOFTENER PO) Take 1 tablet by mouth daily as needed (constipation).    escitalopram (LEXAPRO) 5 MG tablet Take 1 tablet (5 mg total) by mouth daily.   fluticasone (FLONASE) 50 MCG/ACT nasal spray Place 1 spray into both nostrils daily as needed for allergies or rhinitis.   gabapentin (NEURONTIN) 100 MG capsule Take 1 capsule (100 mg total) by mouth 3 (three) times daily.   hydrALAZINE (APRESOLINE) 25 MG tablet Take 1 tablet (25 mg total) by mouth 3 (three) times daily.   leflunomide (ARAVA) 20 MG tablet Take 1 tablet (20 mg total) by mouth daily.   levothyroxine (SYNTHROID) 100 MCG tablet TAKE 1 TABLET BY MOUTH EVERY DAY   loperamide (IMODIUM) 2 MG capsule Take 2 mg by mouth as needed for diarrhea or loose stools.   losartan (COZAAR) 100 MG tablet TAKE 1 TABLET BY MOUTH EVERY DAY   mycophenolate (CELLCEPT) 500 MG tablet Take 500 mg by mouth daily.   ondansetron (ZOFRAN) 4 MG tablet Take 1 tablet (4 mg total) by mouth every 8 (eight) hours as needed for nausea or vomiting. (Patient not taking: Reported on 10/17/2020)   potassium chloride SA (KLOR-CON) 20 MEQ tablet Take 2 tablets (40 mEq total) by mouth once for 1 dose.   prednisoLONE acetate  (PRED FORTE) 1 % ophthalmic suspension Place 1 drop into both eyes daily.    predniSONE (DELTASONE) 5 MG tablet Take 5 mg by mouth daily.   PROLIA 60 MG/ML SOSY injection Inject 60 mg as directed every 6 (six) months.   Facility-Administered Encounter Medications as of 10/22/2020  Medication   cyanocobalamin ((VITAMIN B-12)) injection 1,000 mcg    Current Diagnosis: Patient Active Problem List   Diagnosis Date Noted   Abnormal CT of the chest 09/12/2019   Dyspnea 09/12/2019   Tendinitis of right rotator cuff 02/24/2018   History of CVA (cerebrovascular accident) 03/16/2017   Hematuria 03/16/2017   Acute ischemic right middle cerebral artery (MCA) stroke (HCC) 03/03/2017   Subacromial bursitis of left shoulder joint 08/07/2016   Drug therapy 01/07/2016   Generalized anxiety disorder 01/07/2016   Faintness 12/09/2015   Loss of weight 10/15/2015   Anemia, iron deficiency 09/30/2015   Hyperglycemia 06/07/2015   Near syncope 05/15/2015   Osteoporosis 01/01/2015   Expressive aphasia 12/16/2014   TIA (transient ischemic attack) 12/16/2014   Pain in joint, ankle and foot 09/06/2014   Constipation 09/06/2014   Rheumatoid arthritis ([place]) 07/25/2014   Sciatica 01/31/2014   Hypothyroidism 01/12/2012   Back pain 01/11/2012   Edema 06/15/2011   ANEMIA, B12 DEFICIENCY 11/19/2010   ANXIETY 10/14/2010   MEMORY LOSS 10/14/2010   Hyperlipidemia 12/17/2009  Iron deficiency anemia 12/17/2009   TOBACCO ABUSE 12/17/2009   Depression 12/17/2009   Essential hypertension 12/17/2009   ALLERGIC RHINITIS 12/17/2009   ARTHRITIS, RHEUMATOID 12/17/2009   OSTEOPOROSIS 12/17/2009   URINARY INCONTINENCE 12/17/2009    Goals Addressed   None    Have you seen any other providers since your last visit? Dr. Trudie Reed (rheumatologist) in November.  Any changes in your medications or health? No  Any side effects from any medications? None  Do you have an symptoms or problems not managed by your  medications? None  Any concerns about your health right now? Patient states she has RA, but not concerned with this diagnosis.  Has your provider asked that you check blood pressure, blood sugar, or follow special diet at home? Patient states she checks her blood pressure three times a day.  Do you get any type of exercise on a regular basis? Stated she does some chair exercises weekly. Misses going to the gym and participating in aerobic classes.  Can you think of a goal you would like to reach for your health? None  Do you have any problems getting your medications? No, patient lives close to [facility] pharmacy which is [facility].  Is there anything that you would like to discuss during the appointment? Patient states she has feels symptomatic when her blood pressure drops. This happens primarily in the morning.  Reminded the patient to please have medications and supplements available at the time of her appointment on Thursday December 16th at 11:00 am over the telephone.  Documented preliminary medication plan in preparation for patient's initial chronic care management visit  Follow-Up:  Pharmacist Review   Fanny Skates, [place] Pharmacist Assistant [phone]  Reviewed by: De Blanch, PharmD, BCACP Clinical Pharmacist [facility] [phone]

## 2020-10-24 ENCOUNTER — Other Ambulatory Visit: Payer: Self-pay

## 2020-10-24 ENCOUNTER — Ambulatory Visit: Payer: Medicare Other | Admitting: Pharmacist

## 2020-10-24 DIAGNOSIS — M810 Age-related osteoporosis without current pathological fracture: Secondary | ICD-10-CM

## 2020-10-24 DIAGNOSIS — F17200 Nicotine dependence, unspecified, uncomplicated: Secondary | ICD-10-CM

## 2020-10-24 DIAGNOSIS — E785 Hyperlipidemia, unspecified: Secondary | ICD-10-CM

## 2020-10-24 DIAGNOSIS — I10 Essential (primary) hypertension: Secondary | ICD-10-CM

## 2020-10-24 DIAGNOSIS — R739 Hyperglycemia, unspecified: Secondary | ICD-10-CM

## 2020-10-24 DIAGNOSIS — E039 Hypothyroidism, unspecified: Secondary | ICD-10-CM

## 2020-10-24 NOTE — Chronic Care Management (AMB) (Signed)
Chronic Care Management Pharmacy  Name: ARNELLA PRALLE  MRN: [id number] DOB: 1941-10-30  Chief Complaint/ HPI  Nancy [name],  79 y.o. , female presents for their Initial CCM visit with the clinical pharmacist via telephone.  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 Started lexapro at last visit, but pt concerned with side effects. Keep lexapro on hand should pt develop worsening depression. Variable BP, hesitant to adjust medication noting syncopal episode.   08/09/20: Visit w/ Debbrah Alar, NP - Last visit BP elevated. Added clonidine 0.19m TID. Concern with depression. Continue HTN meds. Restart lexapro 569mdaily. Information regarding counseling services also provided. Restart monthly B12 injections.   07/12/20: Visit w/ MeDebbrah AlarNP - Add clonidine  Consult Visit: 09/10/20: Ophthalmology visit w/ Dr. 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 Continue cellcept 50070mID and Humira injections weekly.   [facility]  Medications: Outpatient Encounter Medications as of 10/24/2020  Medication Sig Note  . Adalimumab 40 MG/0.4ML PNKT Inject 40 mg into the skin once a week. Saturday   . carvedilol (COREG) 25 MG tablet TAKE 1 TABLET BY MOUTH TWICE A Maleeya Peterkin   . cloNIDine (CATAPRES) 0.1 MG tablet Take 1 tablet (0.1 mg total) by mouth 3 (three) times daily.   . clopidogrel (PLAVIX) 75 MG tablet TAKE 1 TABLET BY MOUTH EVERY Chi Woodham   . fluticasone (FLONASE) 50 MCG/ACT nasal  spray Place 1 spray into both nostrils daily as needed for allergies or rhinitis.   . hydrALAZINE (APRESOLINE) 25 MG tablet Take 1 tablet (25 mg total) by mouth 3 (three) times daily.   . lMarland Kitchenflunomide (ARAVA) 20 MG tablet Take 1 tablet (20 mg total) by mouth daily.   . lMarland Kitchensartan (COZAAR) 100 MG tablet TAKE 1 TABLET BY MOUTH EVERY Marlan Steward   . mycophenolate (CELLCEPT) 500 MG tablet Take 500 mg by mouth daily.   . prednisoLONE acetate (PRED FORTE) 1 % ophthalmic suspension Place 1 drop into both eyes daily.    . predniSONE (DELTASONE) 5 MG tablet Take 5 mg by mouth daily.   . PMarland KitchenOLIA 60 MG/ML SOSY injection Inject 60 mg as directed every 6 (six) months.   . [DISCONTINUED] atorvastatin (LIPITOR) 80 MG tablet Take 80 mg by mouth daily.   . DMariane Baumgartenlcium (STOOL SOFTENER PO) Take 1 tablet by mouth daily as needed (constipation).    . eMarland Kitchencitalopram (LEXAPRO) 5 MG tablet Take 1 tablet (5 mg total) by mouth daily. (Patient not taking: Reported on 10/24/2020)   . gabapentin (NEURONTIN) 100 MG capsule Take 1 capsule (100 mg total) by mouth 3 (three) times daily. 10/24/2020: Takes as needed  . levothyroxine (SYNTHROID) 100 MCG tablet TAKE 1 TABLET BY MOUTH EVERY Linkin Vizzini (Patient not taking: Reported on 10/24/2020)   . ondansetron (ZOFRAN) 4 MG tablet Take 1 tablet (4 mg total) by mouth every 8 (eight) hours as needed for nausea or vomiting. (Patient not taking: No sig reported)   . [DISCONTINUED] loperamide (IMODIUM) 2 MG capsule Take 2 mg by mouth as needed for diarrhea or loose stools.   . [DISCONTINUED] potassium chloride SA (KLOR-CON) 20 MEQ tablet Take 2 tablets (  40 mEq total) by mouth once for 1 dose.    Facility-Administered Encounter Medications as of 10/24/2020  Medication  . cyanocobalamin ((VITAMIN B-12)) injection 1,000 mcg   SDOH Screenings   Alcohol Screen: Low Risk   . Last Alcohol Screening Score (AUDIT): 0  Depression (PHQ2-9): Low Risk   . PHQ-2 Score: 2  Financial Resource Strain: Low Risk    . Difficulty of Paying Living Expenses: Not hard at all  Food Insecurity: No Food Insecurity  . Worried About Charity fundraiser in the Last Year: Never true  . Ran Out of Food in the Last Year: Never true  Housing: Low Risk   . Last Housing Risk Score: 0  Physical Activity: Insufficiently Active  . Days of Exercise per Week: 2 days  . Minutes of Exercise per Session: 30 min  Social Connections: Moderately Isolated  . Frequency of Communication with Friends and Family: More than three times a week  . Frequency of Social Gatherings with Friends and Family: More than three times a week  . Attends Religious Services: More than 4 times per year  . Active Member of Clubs or Organizations: No  . Attends Archivist Meetings: Never  . Marital Status: Divorced  Stress: No Stress Concern Present  . Feeling of Stress : Not at all  Tobacco Use: High Risk  . Smoking Tobacco Use: Current Some Marjie Chea Smoker  . Smokeless Tobacco Use: Never Used  Transportation Needs: No Transportation Needs  . Lack of Transportation (Medical): No  . Lack of Transportation (Non-Medical): No     Current Diagnosis/Assessment:  Goals Addressed            This Visit's Progress   . Chronic Care Management Pharmacy Care Plan       CARE PLAN ENTRY (see longitudinal plan of care for additional care plan information)  Current Barriers:  . 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 Pharmacist Clinical Goal(s): o Over the next 90 days, patient will work with PharmD and providers to achieve BP goal <130/80 . Current regimen:   . Carvedilol 69m twice daily . Clonidine 0.11mthree times daily . Hydralazine 2564mhree times daily . Losartan 100m14mily . Interventions: o Requested patient check her blood pressure 2-3 times per week and record o Discussed BP goal . Patient self care activities - Over the next 90 days, patient will: o Check BP 2-3 times per week, document, and provide at future appointments o Ensure daily salt intake < 2300 mg/Kire Ferg  Hyperlipidemia/Hx of Stroke Lab Results  Component Value Date/Time   LDLCALC 116 (H) 03/26/2020 01:16 PM   LDLCDuncan09/09/2018 02:49 PM   . Pharmacist Clinical Goal(s): o Over the next 90 days, patient will work with PharmD and providers to achieve LDL goal < 70 . Current regimen:  . Atorvastatin 80mg84mly  . Clopidogrel 75mg 58my . Interventions: o Explained the benefit of atorvastatin therapy o Explained that atorvastatin calcium is not necessarily the same same as calcium for bones.  o Encouraged patient to take atorvastatin daily to help improve LDL . Patient self care activities - Over the next 90 days, patient will: o Take atorvastatin daily as prescribed  Pre-Diabetes Lab Results  Component Value Date/Time   HGBA1C 6.0 02/06/2020 11:37 AM   HGBA1C 5.7 10/31/2019 10:44 AM   . Pharmacist Clinical Goal(s): o Over the next 90 days, patient will work with PharmD and providers to maintain A1c goal <6.5% . Current regimen:  o Diet and exercise management   . Interventions: o Discussed A1c goal . Patient self care activities - Over the next 90 days, patient will: o Maintain a1c less than 6.5%  Tobacco Use Disorder . Pharmacist Clinical Goal(s) o Over the next 90 days, patient will work with PharmD and providers to reduce consumption of cigarettes . Current regimen:  o None . Interventions: o Discussed smoking cessation.  o Recommended patient start nicotine replacement therapy (NRT) - Start Step 2 (18m/24hr) Nicotine Patch - Start 276mor  37m29micotine gum for breakthrough cravings . Patient self care activities - Over the next 90 days, patient will: o Purchase Step 2 Nicotine patches and start using o Purchase 2mg46m 37mg 32motine gum and start using  Osteoporosis  . Pharmacist Clinical Goal(s) o Over the next 90 days, patient will work with PharmD and providers to reduce risk of fracture due to osteoporosis . Current regimen:  o Prolia 60mg 73my 6 months (last injection 04/10/20) . Interventions: o Recommended patient get scheduled for Prolia injection as soon as possible . Patient self care activities - Over the next 90 days, patient will: o Schedule appointment for Prolia injection  Hypothyroidism . Pharmacist Clinical Goal(s) o Over the next 90 days, patient will work with PharmD and providers to maintain TSH within normal limits . Current regimen:  o None (prescribed levothyroxine 100mcg 43my, but hasn't taken in over 6 months) . Interventions: o Consider repeat TSH lab to assure TSH within normal limits without taking levothyroxine . Patient self care activities - Over the next 90 days, patient will: o Complete TSH lab at next office visit  Medication management . Pharmacist Clinical Goal(s): o Over the next 90 days, patient will work with PharmD and providers to achieve optimal medication adherence . Current [facility]: [facility] . Interventions o Comprehensive medication review performed. o Continue current medication management strategy . Patient self care activities - Over the next 90 days, patient will: o Focus on medication adherence by filling and taking medications appropriately  o Take medications as prescribed o Report any questions or concerns to PharmD and/or provider(s)  Initial goal documentation        Hypertension   BP goal is:  <130/80  Office blood pressures are  BP Readings from Last 3 Encounters:  10/17/20 (!) 148/70  09/09/20 (!) 161/53  08/09/20 (!) 148/56   Patient checks BP at  home three times daily Patient home BP readings are ranging: 146 78 151 80 146 80 175 Avg   154.5  79.33  Patient has failed these meds in the past: amlodipine (dizziness) Patient is currently uncontrolled on the following medications:  . Carvedilol 25mg tw25mdaily . Clonidine 0.1mg thre42mimes daily . Hydralazine 25mg thre55mmes daily . Losartan 100mg daily3mnies headaches, chest pains, dizziness  We discussed BP goal   Plan -Check BP 2-3 times per week and record -Continue current medications   Future Plan -Discuss with patient the link between smoking, elevated BP, and risk of recurrent stroke    Hyperlipidemia/Hx of Stroke   LDL goal <70  Last lipids Lab Results  Component Value Date   CHOL 186 03/26/2020   HDL 43.20 03/26/2020  LDLCALC 116 (H) 03/26/2020   TRIG 133.0 03/26/2020   CHOLHDL 4 03/26/2020   Hepatic Function Latest Ref Rng & Units 04/10/2020 03/29/2020 03/25/2020  Total Protein 6.0 - 8.3 g/dL 6.6 6.3 6.4  Albumin 3.5 - 5.2 g/dL 3.7 3.5 3.5  AST 0 - 37 U/L 20 45(H) 53(H)  ALT 0 - 35 U/L 25 49(H) 66(H)  Alk Phosphatase 39 - 117 U/L 75 115 118(H)  Total Bilirubin 0.2 - 1.2 mg/dL 0.8 0.8 0.7  Bilirubin, Direct 0.0 - 0.3 mg/dL - 0.2 -     The ASCVD Risk score ([place]., et al., 2013) failed to calculate for the following reasons:   The patient has a prior MI or stroke diagnosis   Patient has failed these meds in past: pravastatin (inefficacy?) Patient is currently uncontrolled on the following medications:  . Atorvastatin 96m daily (admits she doesn't take daily; only 1-2 times a week) . Clopidogrel 761mdaily  Recommended patient take atorvastatin daily.  She was confused by the medication name of atorvastatin calcium and thought this was for her bones so she didn't think she needed to take it daily.  Explained that calcium is the salt form attached to the atorvastatin, not necessarily the same calcium for her bones.  Explained the  benefits of statin therapy  Fill Dates Per Dispense Report - Filled Appropriately Atorvastatin: 08/02/20 - 90 DS, 05/02/20 - 90 DS  We discussed:  LDL goal  Plan -Start taking atorvastatin daily -Continue current medications  Pre-Diabetes   A1c goal <6.5%  Recent Relevant Labs: Lab Results  Component Value Date/Time   HGBA1C 6.0 02/06/2020 11:37 AM   HGBA1C 5.7 10/31/2019 10:44 AM   GFR 84.93 04/10/2020 11:09 AM   GFR 90.19 03/29/2020 10:54 AM    Patient has failed these meds in past: None noted  Patient is currently controlled on the following medications: . None  We discussed: A1c goal  Plan -Continue control with diet and exercise  Hypothyroidism   Lab Results  Component Value Date/Time   TSH 1.10 02/06/2020 11:37 AM   TSH 0.35 10/31/2019 10:44 AM   FREET4 1.17 08/14/2014 09:49 AM   FREET4 1.09 07/25/2014 02:10 PM    Patient has failed these meds in past: None noted  Patient is currently controlled on the following medications:  . Levothyroxine 10054mdaily (hasn't taken in over 6 months)  States she was taken off levothyroxine about 6 months ago when she was admitted.  Hasn't taken it for 6 months or more, and has not had any issues with her thyroid per patient.   Plan -Recheck TSH and assure patient is euthyroid without thyroid medication -Continue current medications  Depression   [facility] 10/24/2020 10/17/2020 08/09/2020  Decreased Interest 0 0 1  Down, Depressed, Hopeless 0 0 0  PHQ - 2 Score 0 0 1  Altered sleeping 1 - -  Tired, decreased energy 0 - -  Change in appetite 0 - -  Feeling bad or failure about yourself  0 - -  Trouble concentrating 1 - -  Moving slowly or fidgety/restless 0 - -  Suicidal thoughts 0 - -  PHQ-9 Score 2 - -  Some recent data might be hidden    Patient has failed these meds in past: bupropion, sertraline (listed in D/C meds. No apparent reason for D/C) Patient is currently controlled on the  following medications:  . Escitalopram 5mg26mily (prescribed, but pt is not taking)  She had  an episode in the past where she was depressed and she considered suicide and possible harming of her mother  She states she spoke to God and decided she didn't want to take depression meds.  She states she feels she is coping better and does not need this medication. States she only took once since prescribed. Hasn't taken in about a month.  Encouraged patient to reach out if she needed additional support and that it could be agreeable for her to continue her D/C of escitalopram as long as she is feeling ok.  Plan -Continue current management  Tobacco Use Disorder   Tobacco Status:  Social History   Tobacco Use  Smoking Status Current Some Tam Delisle Smoker  . Packs/Quinn Bartling: 0.50  . Years: 32.00  . Pack years: 16.00  . Types: Cigarettes  Smokeless Tobacco Never Used  Tobacco Comment   6 or 7 per Mikal Blasdell    Patient smokes Within 30 minutes of waking Patient triggers include: finishing a meal On a scale of 1-10, reports MOTIVATION to quit is 8 On a scale of 1-10, reports CONFIDENCE in quitting is 1  Previous quit attempts included: 2 Patient is currently uncontrolled on the following medications:  . None  Feels like she smoked more when pandemic started Says she is motivated to quit, but doesn't know where to start and doesn't feel confident she can Has never tried NRT She states she is interested in starting NRT She will look into her [employer] OTC book to see if it is available there.  She called back after her visit to confirm dosing. Confirmed she should start with the Step 2 (86m/Rogena Deupree) patch since she smokes 6-8 cigarettes (<10 cigarettes) per Ragina Fenter.  Also educated patient that she could start 2 or 423mnicotine gum to use in place of cigarettes when she gets a craving.  17m47mfor patients who smoke within 30 minutes of waking) 2mg53mor all other patients) Note that some patient's can't tolerate  the 17mg 28motine gum dose.   We discussed:  Counseled on patch placement, side effects, and option to remove at night if they experience trouble sleeping or bad dreams.  Counseled on park & chew method for NRT gum. Provided contact information for [place] [facility] (1-800-QUIT-NOW) and encouraged patient to reach out to this group for support.  Plan -Start Step 2 (117mg/81m Nicotine Patch -Start Nicotine 2mg or27mg nic38mne gum for breakthrough cravings  Osteoporosis   Last DEXA Scan: 04/19/2020  T-Score femur total mean: -3.5  T-Score lumbar spine: -1.8  T-Score forearm radius: -4.3   No results found for: VD25OH   Patient is a candidate for pharmacologic treatment due to T-Score < -2.5 in femoral neck  Patient has failed these meds in past: alendronate (side effects) Patient is currently controlled on the following medications:  . Prolia 60mg eve75m months (last injection 04/10/20)  Due for another Prolia injection  Plan -Schedule Prolia injection ASAP -Continue current medications   Rheumatoid Arthritis/Chorioretinal inflammation of both eyes   Followed by Rheumatology (Dr. Hawkes)  Trudie Reedt has failed these meds in past: None noted  Patient is currently controlled on the following medications: . Humira (Adalimumab) 40mg week42monSaturday) . Leflunomide 20mg daily33mrednisone 5mg daily  78man -Continue current medications   Sciatica     Patient has failed these meds in past: None noted  Patient is currently controlled on the following medications: . Gabapentin 100mg three t43m daily  States this was for pain in  her feet.  Takes as needed (takes 1 capsule at a time about 4 times a week) Feels more pain when she goes out  Plan -Continue current medications   Allergic Rhinitis    Patient has failed these meds in past: None noted  Patient is currently controlled on the following medications: . Fluticasone 87mg 1 spray both nostrils daily    Plan -Continue  current medications     Vaccines   Reviewed and discussed patient's vaccination history.   Review at next CCM Visit Significant time discussing smoking cessation  Immunization History  Administered Date(s) Administered  . Fluad Quad(high Dose 65+) 08/01/2019, 08/06/2020  . Influenza Split 10/16/2011  . Influenza Whole 09/10/2009, 08/18/2010  . Influenza, High Dose Seasonal PF 09/10/2015, 08/31/2016, 07/19/2017, 08/03/2018  . Influenza,inj,Quad PF,6+ Mos 09/05/2014  . Influenza,inj,quad, With Preservative 07/19/2017  . Influenza-Unspecified 08/12/2012, 08/09/2013  . PFIZER SARS-COV-2 Vaccination 12/04/2019, 12/25/2019, 08/16/2020  . Pneumococcal Conjugate-13 09/05/2014  . Pneumococcal Polysaccharide-23 01/01/2009  . Td 12/26/2014    Medication Management   Patient's [facility] pharmacy [facility]:  [id number] [place], [id number]: [phone]: [phone] pill box? Yes  Miscellaneous Meds Docusate 10054mLoperamide 2mg58mdansetron 4mg 17mcophenolate 500mg 66my - for her eyes per rheum?  Plan Continue current medication management strategy    Follow up: 1 month phone visit  KaneshDe BlanchmD, BCACP Clinical Pharmacist [phone]

## 2020-10-28 ENCOUNTER — Other Ambulatory Visit: Payer: Self-pay | Admitting: Family

## 2020-10-28 NOTE — Patient Instructions (Addendum)
Visit Information  Goals Addressed            This Visit's Progress   . Chronic Care Management Pharmacy Care Plan       CARE PLAN ENTRY (see longitudinal plan of care for additional care plan information)  Current Barriers:  . 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 Pharmacist Clinical Goal(s): o Over the next 90 days, patient will work with PharmD and providers to achieve BP goal <130/80 . Current regimen:  . Carvedilol 25mg  twice daily . Clonidine 0.1mg  three times daily . Hydralazine 25mg  three times daily . Losartan 100mg  daily . Interventions: o Requested patient check her blood pressure 2-3 times per week and record o Discussed BP goal . Patient self care activities - Over the next 90 days, patient will: o Check BP 2-3 times per week, document, and provide at future appointments o Ensure daily salt intake < 2300 mg/Nancy [name]  Hyperlipidemia/Hx of Stroke Lab Results  Component Value Date/Time   LDLCALC 116 (H) 03/26/2020 01:16 PM   [place] 85 07/20/2018 02:49 PM   . Pharmacist Clinical Goal(s): o Over the next 90 days, patient will work with PharmD and providers to achieve LDL goal < 70 . Current regimen:  . Atorvastatin 80mg  daily  . Clopidogrel 75mg  daily . Interventions: o Explained the benefit of atorvastatin therapy o Explained that atorvastatin calcium is not necessarily the same same as calcium for bones.  o Encouraged patient to take atorvastatin daily to help improve LDL . Patient self care activities - Over the next 90 days, patient will: o Take atorvastatin daily as  prescribed  Pre-Diabetes Lab Results  Component Value Date/Time   HGBA1C 6.0 02/06/2020 11:37 AM   HGBA1C 5.7 10/31/2019 10:44 AM   . Pharmacist Clinical Goal(s): o Over the next 90 days, patient will work with PharmD and providers to maintain A1c goal <6.5% . Current regimen:  o Diet and exercise management   . Interventions: o Discussed A1c goal . Patient self care activities - Over the next 90 days, patient will: o Maintain a1c less than 6.5%  Tobacco Use Disorder . Pharmacist Clinical Goal(s) o Over the next 90 days, patient will work with PharmD and providers to reduce consumption of cigarettes . Current regimen:  o None . Interventions: o Discussed smoking cessation.  o Recommended patient start nicotine replacement therapy (NRT) - Start Step 2 (14mg /24hr) Nicotine Patch - Start 2mg  or 4mg  nicotine gum for breakthrough cravings . Patient self care activities - Over the next 90 days, patient will: o Purchase Step 2 Nicotine patches and start using o Purchase 2mg  or 4mg  nicotine gum and start using  Osteoporosis  . Pharmacist Clinical Goal(s) o Over the next 90 days, patient will work with PharmD and providers to reduce risk of fracture due to osteoporosis . Current regimen:  o Prolia 60mg  every 6 months (last injection 04/10/20) . Interventions: o Recommended patient get scheduled for Prolia injection as soon as possible . Patient self care activities - Over the next 90 days, patient will: o Schedule appointment for Prolia injection  Hypothyroidism . Pharmacist Clinical Goal(s) o Over the next 90 days, patient will work with PharmD and providers to maintain TSH  within normal limits . Current regimen:  o None (prescribed levothyroxine 156mcg daily, but hasn't taken in over 6 months) . Interventions: o Consider repeat TSH lab to assure TSH within normal limits without taking levothyroxine . Patient self care activities - Over the next 90 days, patient  will: o Complete TSH lab at next office visit  Medication management . Pharmacist Clinical Goal(s): o Over the next 90 days, patient will work with PharmD and providers to achieve optimal medication adherence . Current pharmacy: [facility] . Interventions o Comprehensive medication review performed. o Continue current medication management strategy . Patient self care activities - Over the next 90 days, patient will: o Focus on medication adherence by filling and taking medications appropriately  o Take medications as prescribed o Report any questions or concerns to PharmD and/or provider(s)  Initial goal documentation        Nancy [name] was given information about Chronic Care Management services today including:  1. CCM service includes personalized support from designated clinical staff supervised by her physician, including individualized plan of care and coordination with other care providers 2. 24/7 contact phone numbers for assistance for urgent and routine care needs. 3. Standard insurance, coinsurance, copays and deductibles apply for chronic care management only during months in which we provide at least 20 minutes of these services. Most insurances cover these services at 100%, however patients may be responsible for any copay, coinsurance and/or deductible if applicable. This service may help you avoid the need for more expensive face-to-face services. 4. Only one practitioner may furnish and bill the service in a calendar month. 5. The patient may stop CCM services at any time (effective at the end of the month) by phone call to the office staff.  Patient agreed to services and verbal consent obtained.   The patient verbalized understanding of instructions, educational materials, and care plan provided today and agreed to receive a mailed copy of patient instructions, educational materials, and care plan.  Telephone follow up appointment with pharmacy team member scheduled for:  01/24/2021  Nancy [name], [facility]    Coping with Quitting Smoking  Quitting smoking is a physical and mental challenge. You will face cravings, withdrawal symptoms, and temptation. Before quitting, work with your health care provider to make a plan that can help you cope. Preparation can help you quit and keep you from giving in. How can I cope with cravings? Cravings usually last for 5-10 minutes. If you get through it, the craving will pass. Consider taking the following actions to help you cope with cravings:  Keep your mouth busy: ? Chew sugar-free gum. ? Suck on hard candies or a straw. ? Brush your teeth.  Keep your hands and body busy: ? Immediately change to a different activity when you feel a craving. ? Squeeze or play with a ball. ? Do an activity or a hobby, like making bead jewelry, practicing needlepoint, or working with wood. ? Mix up your normal routine. ? Take a short exercise break. Go for a quick walk or run up and down stairs. ? Spend time in public places where smoking is not allowed.  Focus on doing something kind or helpful for someone else.  Call a friend or family member to talk during a craving.  Join a support group.  Call a quit line, such as 1-800-QUIT-NOW.  Talk with your health care provider about medicines that might help you cope with cravings and make quitting easier for you. How can I deal with  withdrawal symptoms? Your body may experience negative effects as it tries to get used to not having nicotine in the system. These effects are called withdrawal symptoms. They may include:  Feeling hungrier than normal.  Trouble concentrating.  Irritability.  Trouble sleeping.  Feeling depressed.  Restlessness and agitation.  Craving a cigarette. To manage withdrawal symptoms:  Avoid places, people, and activities that trigger your cravings.  Remember why you want to quit.  Get plenty of sleep.  Avoid coffee and other caffeinated drinks.  These may worsen some of your symptoms. How can I handle social situations? Social situations can be difficult when you are quitting smoking, especially in the first few weeks. To manage this, you can:  Avoid parties, bars, and other social situations where people might be smoking.  Avoid alcohol.  Leave right away if you have the urge to smoke.  Explain to your family and friends that you are quitting smoking. Ask for understanding and support.  Plan activities with friends or family where smoking is not an option. What are some ways I can cope with stress? Wanting to smoke may cause stress, and stress can make you want to smoke. Find ways to manage your stress. Relaxation techniques can help. For example:  Breathe slowly and deeply, in through your nose and out through your mouth.  Listen to soothing, relaxing music.  Talk with a family member or friend about your stress.  Light a candle.  Soak in a bath or take a shower.  Think about a peaceful place. What are some ways I can prevent weight gain? Be aware that many people gain weight after they quit smoking. However, not everyone does. To keep from gaining weight, have a plan in place before you quit and stick to the plan after you quit. Your plan should include:  Having healthy snacks. When you have a craving, it may help to: ? Eat plain popcorn, crunchy carrots, celery, or other cut vegetables. ? Chew sugar-free gum.  Changing how you eat: ? Eat small portion sizes at meals. ? Eat 4-6 small meals throughout the Nancy [name] instead of 1-2 large meals a Nancy [name]. ? Be mindful when you eat. Do not watch television or do other things that might distract you as you eat.  Exercising regularly: ? Make time to exercise each Nancy [name]. If you do not have time for a long workout, do short bouts of exercise for 5-10 minutes several times a Nancy [name]. ? Do some form of strengthening exercise, like weight lifting, and some form of aerobic exercise, like  running or swimming.  Drinking plenty of water or other low-calorie or no-calorie drinks. Drink 6-8 glasses of water daily, or as much as instructed by your health care provider. Summary  Quitting smoking is a physical and mental challenge. You will face cravings, withdrawal symptoms, and temptation to smoke again. Preparation can help you as you go through these challenges.  You can cope with cravings by keeping your mouth busy (such as by chewing gum), keeping your body and hands busy, and making calls to family, friends, or a helpline for people who want to quit smoking.  You can cope with withdrawal symptoms by avoiding places where people smoke, avoiding drinks with caffeine, and getting plenty of rest.  Ask your health care provider about the different ways to prevent weight gain, avoid stress, and handle social situations. This information is not intended to replace advice given to you by your health care provider. Make sure you discuss  any questions you have with your health care provider. Document Revised: 10/08/2017 Document Reviewed: 10/23/2016 Elsevier Patient Education  2020 [employer].

## 2020-10-29 ENCOUNTER — Telehealth: Payer: Self-pay | Admitting: Family

## 2020-10-29 DIAGNOSIS — E039 Hypothyroidism, unspecified: Secondary | ICD-10-CM

## 2020-10-29 NOTE — Telephone Encounter (Signed)
-----   Message from [place], [facility] sent at 10/28/2020  6:58 PM EST ----- Hey Mitzo!   Could you call this patient to get her schedule for her next Prolia Injection? Maybe she can get it the same day she's scheduled for her B12 injection?   Thanks,  De [place]anch, PharmD, BCACP Clinical Pharmacist [facility] [phone]

## 2020-10-29 NOTE — Telephone Encounter (Signed)
Spoke to patient, lab appointment has been scheduled.

## 2020-10-29 NOTE — Telephone Encounter (Signed)
Please let pt know that I reviewed her chart with the pharmacist and see that she is due for a TSH- please schedule a lab appointment.

## 2020-10-29 NOTE — Telephone Encounter (Signed)
Spoke to pt, appt schedule 

## 2020-10-31 ENCOUNTER — Other Ambulatory Visit: Payer: Self-pay

## 2020-10-31 ENCOUNTER — Other Ambulatory Visit (INDEPENDENT_AMBULATORY_CARE_PROVIDER_SITE_OTHER): Payer: Medicare Other

## 2020-10-31 DIAGNOSIS — E039 Hypothyroidism, unspecified: Secondary | ICD-10-CM

## 2020-10-31 LAB — TSH: TSH: 0.78 u[IU]/mL (ref 0.35–4.50)

## 2020-11-02 ENCOUNTER — Other Ambulatory Visit: Payer: Self-pay | Admitting: Family

## 2020-11-12 ENCOUNTER — Other Ambulatory Visit: Payer: Self-pay

## 2020-11-12 ENCOUNTER — Ambulatory Visit (INDEPENDENT_AMBULATORY_CARE_PROVIDER_SITE_OTHER): Payer: Medicare Other

## 2020-11-12 DIAGNOSIS — E538 Deficiency of other specified B group vitamins: Secondary | ICD-10-CM

## 2020-11-12 DIAGNOSIS — M810 Age-related osteoporosis without current pathological fracture: Secondary | ICD-10-CM

## 2020-11-12 MED ORDER — DENOSUMAB 60 MG/ML ~~LOC~~ SOSY
60.0000 mg | PREFILLED_SYRINGE | Freq: Once | SUBCUTANEOUS | Status: AC
  Administered 2020-11-12: 60 mg via SUBCUTANEOUS

## 2020-11-12 MED ORDER — CYANOCOBALAMIN 1000 MCG/ML IJ SOLN
1000.0000 ug | INTRAMUSCULAR | Status: DC
  Administered 2020-11-12: 1000 ug via INTRAMUSCULAR

## 2020-11-12 NOTE — Progress Notes (Signed)
Pt here for monthly B12 injection per Sandford Craze  B12 given IM in left deltoid, and pt tolerated injection well.  Next B12 injection scheduled for 12/13/2020  Pt also received Prolia injection in right arm. Pt tolerated well.

## 2020-11-18 ENCOUNTER — Other Ambulatory Visit: Payer: Self-pay | Admitting: Family

## 2020-11-19 ENCOUNTER — Encounter: Payer: Self-pay | Admitting: Neurology

## 2020-11-19 ENCOUNTER — Ambulatory Visit (INDEPENDENT_AMBULATORY_CARE_PROVIDER_SITE_OTHER): Payer: Medicare Other | Admitting: Neurology

## 2020-11-19 VITALS — BP 147/64 | HR 70 | Ht 65.0 in | Wt 106.8 lb

## 2020-11-19 DIAGNOSIS — R55 Syncope and collapse: Secondary | ICD-10-CM

## 2020-11-19 DIAGNOSIS — Z8673 Personal history of transient ischemic attack (TIA), and cerebral infarction without residual deficits: Secondary | ICD-10-CM | POA: Diagnosis not present

## 2020-11-19 NOTE — Progress Notes (Signed)
[facility] [address] [place]. [place] [address] [phone]       OFFICE FOLLOW-UP NOTE  [name] Date of Birth:  [birth year] Medical Record Number:  [id number]   HPI: Last office visit 05/15/2015 : 80 year is an elderly lady seen today for first office follow-up visit following admission for TIA on 12/16/14. She presented with the transient speech output difficulties as well as confusion followed by some tremulousness. Symptoms lasted around 30-45 minutes and started resolving by the time she reached the hospital. On arrival she had no focal deficits. MRI scan of the brain showed no acute infarct and changes of small vessel disease. 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 Hemoglobin A1c was 6.6. Transthoracic echo was unremarkable. Carotid ultrasound showed no significant extra-axial stenosis. LDL cholesterol was elevated at 121. The episode was felt to also possibly represent a seizure and EEG was obtained which was normal. She started on aspirin which is tolerating well without bleeding or bruising. She today and found that she had another episode 3 years ago while in church she was walking outside she felt weak all over and she lost her voice and had to sit down for a little while. She was seen at [facility] where blood pressure was found to be significantly elevated. Last year in September she had only episode where she felt disoriented and confused and nauseous but did not pass out. Patient has not been evaluated with cardiac event monitor for arrhythmias yet. She remains on Lipitor she is tolerating well without side effects. She is also on aspirin without bleeding or bruising. Update 04/11/18 : She returns for follow-up after last visit with me nearly 3 years ago.  She states she has had no definite recurrent TIA or strokelike symptoms.  She remains on Plavix which is tolerating well without bruising  or bleeding.  She states her blood pressure is usually well controlled though today it is elevated in office slightly.  She has been increasing salt intake in her food and states her primary care physician asked her to do so.  She did have some lab work last month which showed low sodium of 134.  TSH was suppressed for which her Synthroid has been adjusted.  She complains of decreased appetite and weight loss but plans to discuss this with her primary physician.  She is tolerating Lipitor well without muscle aches and pains but cannot tell me when the last time her lipid profile was checked.  She did have an episode of syncope in January.  She was visiting a restaurant with her daughter and feels that she passed out.  She was incontinent of bowel.  She is previously had work-up for her syncopal events in 2016 and she had an EEG as well as carotid ultrasound which were unremarkable.  She also had a Holter monitor which was unrevealing.  She was seen by Nancy [name] neurologist on 12/30/2015 but has not not had any new recurrent neurological symptoms. Update 07/20/2018 : he is referred back for neurological opinion by primary physician as she had one more episode of loss of consciousness about a month ago. She states she was sitting outside the house talking to a neighbor 1 out of that and she passed out and fell forwards. The neighbor tried to arouse her and patient was unresponsive for several minutes. By the time she regained consciousness the paramedics were there.  She felt lightheaded and disoriented for some time. She had urinated". There is no tongue bite or injury. She had no significant headache or focal extremity weakness at that time. The patient has also noticed new left temporal headaches with retro-orbital pain for the last month or so. She has seen her primary care physician and has been referred to ophthalmology at [facility] and has an appointment in a few weeks. 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 She does follow up with Nancy [name] for that. Patient has had multiple workup for the syncope versus seizure in the past with a 48-hour EEG interpreted 2017 as well as 2 other EEGs in February and July 2016 been normal. MRI scan of the brain except 2016 was also unremarkable. She has not had any recent brain imaging studies are EEG on neurovascular imaging done following the recent episode. Update 10/24/2018 : She returns for follow-up after last visit 3 months ago.  She had MRI scan of the brain done on 07/29/2018 which I personally reviewed shows small vessel disease changes and mild generalized atrophy no acute abnormality or definite strokes are noted.  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  MRA of the neck shows no significant large vessel extracranial stenosis.  Lab work on 07/20/2018 showed hemoglobin A1c of 5.5 and LDL cholesterol of 138 mg percent.  She has since been started on Lipitor and the dose was increased to 80 mg daily which is tolerating well without side effects.  She states her headaches are no longer bothersome but she still on Depakote.  She is been complaining of left eye pain and redness for which he has seen an eye doctor and rheumatologist.  She has been started on a trial of prednisone.  She also complains of dizziness and feeling heaviness in the legs particularly after she has been on her feet for a long time.  She has not had any further episodes of passing out or fainting.  She does not do any orthostatic tolerance exercises.  05/30/2019 VISIT  Nancy [name] is a pleasant 80 year old female with longstanding history of syncopal episodes and is being seen today for routine follow-up.  She did have recent syncopal event where she was evaluated at [facility] on 05/26/2019.  Episode consisted of loss of consciousness which was witnessed by daughter.  She denies symptoms of dizziness,  lightheadedness or tunnel vision prior to episode.  It was felt as though likely related to dehydration with decrease p.o. intake related to increase in diarrhea.  Recently started on cilostazol by vascular surgery and had side effects of frequent episodes of diarrhea.  She did attempt to split dosage in half but she continued to have diarrhea.  She continued on this medication for approximately 1 month prior to the syncopal event.  MRI brain unremarkable.  No indication for EEG as she has had extensive work-up for potential seizure activity in the past which was all unremarkable.  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  She denies neck pain or any type of radiculopathy pain.  She has since stopped the cilostazol and has had no additional episodes of diarrhea.  She is now experiencing constipation.  She does experience intermittent balance difficulties but this has been longstanding and denies any worsening  which is likely secondary to rheumatoid arthritis.  Blood pressure is monitored at home with waxing/waning of levels.  Blood pressure today 169/76, asymptomatic.  She continues on clopidogrel and atorvastatin for secondary stroke prevention without reported side effects.  No further concerns at this time.  Update 11/19/2020 : She returns for follow-up after last year and a half ago with [place] practitioner.  She has not had recurrent TIA or stroke symptoms now since 2016.  She remains on Plavix which she is tolerating well without bruising or bleeding.  She states her blood pressure is under good control and today it is borderline at 147/64.  She has had no recent carotid ultrasound done though last lipid profile on 03/26/2020 showed LDL cholesterol of one one 6 mg percent and hemoglobin A1c was 5.7 on 02/06/2020.  She  continues to have episodes of passing out which are mostly unprovoked" she feels sometimes she has a sensation of bladder urgency.  Has brief loss of consciousness and on occasion has had incontinence as well.  Last episode was in November.  She last saw primary care physician Lemar Livings nurse practitioner on 09/09/2020 and due to the last episode she losing bowel and bladder incontinence concern for seizure was raised and whether she needs renewed work-up.  ROS:   14 system review of systems is positive for syncope, fatigue, blurred vision, constipation, incontinence of bladder, joint pain, passing out and all other systems negative   PMH:  Past Medical History:  Diagnosis Date  . Allergy   . Anemia    iron deficiency  . 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 Cataract   . Colon polyps   . CVA (cerebral vascular accident) ([place]) 02/2017  . Depression   . Hyperlipidemia   . Hypertension   . Osteoporosis 01/01/2015  . Rheumatoid arteritis ([place])   . Thyroid disease   . TIA (transient ischemic attack) 2016  . Urinary incontinence     Social History:  Social History   Socioeconomic History  . Marital status: Divorced    Spouse name: Not on file  . Number of children: 8  . Years of education: Not on file  . Highest education level: Not on file  Occupational History  . Occupation: retired    Fish farm manager: RETIRED    Comment: Retired from [employer]  . Smoking status: Current Some Day Smoker    Packs/day: 0.50    Years: 32.00    Pack years: 16.00    Types: Cigarettes  . Smokeless tobacco: Never Used  . Tobacco comment: 6 or 7 per day  Vaping Use  . Vaping Use: Never used  Substance and Sexual Activity  . Alcohol use: No    Alcohol/week: 0.0 standard drinks  . Drug use: No  . Sexual activity: Never  Other Topics Concern  . Not on file  Social History Narrative   Lives alone   Right Handed    Drinks 6-8 cups caffeine daily   Social Determinants of Health    Financial Resource Strain: Low Risk   . Difficulty of Paying Living Expenses: Not hard at all  Food Insecurity: No Food Insecurity  . Worried About Charity fundraiser in the Last Year: Never true  . Ran Out of Food in the Last Year: Never true  Transportation Needs: No Transportation Needs  . Lack of Transportation (Medical): No  . Lack of Transportation (Non-Medical): No  Physical Activity: Insufficiently Active  . Days  of Exercise per Week: 2 days  . Minutes of Exercise per Session: 30 min  Stress: No Stress Concern Present  . Feeling of Stress : Not at all  Social Connections: Moderately Isolated  . Frequency of Communication with Friends and Family: More than three times a week  . Frequency of Social Gatherings with Friends and Family: More than three times a week  . Attends Religious Services: More than 4 times per year  . Active Member of Clubs or Organizations: No  . Attends Archivist Meetings: Never  . Marital Status: Divorced  Human resources officer Violence: Not At Risk  . Fear of Current or Ex-Partner: No  . Emotionally Abused: No  . Physically Abused: No  . Sexually Abused: No    Medications:   Current Outpatient Medications on File Prior to Visit  Medication Sig Dispense Refill  . Adalimumab 40 MG/0.4ML PNKT Inject 40 mg into the skin once a week. Saturday    . atorvastatin (LIPITOR) 80 MG tablet TAKE 1 TABLET BY MOUTH EVERY DAY 90 tablet 1  . carvedilol (COREG) 25 MG tablet TAKE 1 TABLET BY MOUTH TWICE A DAY 180 tablet 1  . cloNIDine (CATAPRES) 0.1 MG tablet TAKE 1 TABLET BY MOUTH 3 TIMES DAILY. 60 tablet 2  . clopidogrel (PLAVIX) 75 MG tablet TAKE 1 TABLET BY MOUTH EVERY DAY 90 tablet 1  . Docusate Calcium (STOOL SOFTENER PO) Take 1 tablet by mouth daily as needed (constipation).     Marland Kitchen escitalopram (LEXAPRO) 5 MG tablet Take 1 tablet (5 mg total) by mouth daily. 90 tablet 0  . fluticasone (FLONASE) 50 MCG/ACT nasal spray Place 1 spray into both nostrils  daily as needed for allergies or rhinitis.    Marland Kitchen gabapentin (NEURONTIN) 100 MG capsule Take 1 capsule (100 mg total) by mouth 3 (three) times daily. 90 capsule 3  . hydrALAZINE (APRESOLINE) 25 MG tablet Take 1 tablet (25 mg total) by mouth 3 (three) times daily. 90 tablet 5  . leflunomide (ARAVA) 20 MG tablet Take 1 tablet (20 mg total) by mouth daily.    Marland Kitchen levothyroxine (SYNTHROID) 100 MCG tablet TAKE 1 TABLET BY MOUTH EVERY DAY 90 tablet 1  . losartan (COZAAR) 100 MG tablet TAKE 1 TABLET BY MOUTH EVERY DAY 90 tablet 1  . mycophenolate (CELLCEPT) 500 MG tablet Take 500 mg by mouth daily.    . ondansetron (ZOFRAN) 4 MG tablet Take 1 tablet (4 mg total) by mouth every 8 (eight) hours as needed for nausea or vomiting. 20 tablet 0  . prednisoLONE acetate (PRED FORTE) 1 % ophthalmic suspension Place 1 drop into both eyes daily.     . predniSONE (DELTASONE) 5 MG tablet Take 5 mg by mouth daily.    Marland Kitchen PROLIA 60 MG/ML SOSY injection Inject 60 mg as directed every 6 (six) months.     Current Facility-Administered Medications on File Prior to Visit  Medication Dose Route Frequency Provider Last Rate Last Admin  . cyanocobalamin ((VITAMIN B-12)) injection 1,000 mcg  1,000 mcg Intramuscular Q30 days Debbrah Alar, NP   1,000 mcg at 01/05/20 1109  . cyanocobalamin ((VITAMIN B-12)) injection 1,000 mcg  1,000 mcg Intramuscular Q30 days Debbrah Alar, NP   1,000 mcg at 11/12/20 1038    Allergies:   Allergies  Allergen Reactions  . Iron Nausea And Vomiting    Can take infusions   . Norvasc [Amlodipine] Other (See Comments)    dizziness    Physical Exam General:frail pleasant elderly  african american lady, seated, in no evident distress Head: head normocephalic and atraumatic.  Neck: supple with no carotid or supraclavicular bruits Cardiovascular: regular rate and rhythm, no murmurs Musculoskeletal:rheumatoid deformityi n right hand fingers.  She has knocked knees Skin:  no  rash/petichiae Vascular:  Normal pulses all extremities  Vitals:   11/19/20 0903  BP: (!) 147/64  Pulse: 70   Neurologic Exam Mental Status: Awake and fully alert. Oriented to place and time. Recent and remote memory intact. Attention span, concentration and fund of knowledge appropriate. Mood and affect appropriate.  Cranial Nerves: Pupils equal, briskly reactive to light.  Funduscopic exam not done extraocular movements full without nystagmus. Visual fields full to confrontation. Hearing intact. Facial sensation intact. Face, tongue, palate moves normally and symmetrically.  Motor: Normal bulk and tone. Normal strength in all tested extremity muscles. Sensory.: intact to touch ,pinprick .position sensation.but diminished vibration over toes bilaterally  Coordination: Rapid alternating movements normal in all extremities. Finger-to-nose and heel-to-shin performed accurately bilaterally. Gait and Station: Arises from chair without difficulty. Stance is slightly stooped. Gait demonstrates mild imbalance particularly when she turns.  Unable to walk tandem Reflexes: 1+ and symmetric. Toes downgoing.      ASSESSMENT: 80 year old African-American lady with remote history of left hemispheric TIA in February 2016 due to small vessel disease with vascular risk factors of hypertension, hyperlipidemia, peripheral arterial disease atherosclerosis and age.  She is stable from neurovascular standpoint history of multiple episodes of syncope likely vasovagal which in the past have occurred mostly in the setting of dehydration.  Last episode was in November 2021.  PLAN: -I had a long discussion with the patient regarding her recurrent episodes of syncope with negative work-up for seizures and neurovascular stenosis in the past.  Recommend repeat EEG as well as carotid ultrasound transcranial Doppler with as it has been a while since they were checked.  Continue Plavix for stroke prevention and maintain  aggressive risk factor modification to strict control of hypertension with blood pressure goal below 130/90, lipids with LDL cholesterol goal below 70 mg percent and diabetes with hemoglobin A1c goal below 6.5%.  She was advised to return for follow-up in 6 months with my nurse practitioner Janett Billow call earlier if necessary.Greater than 50% of time during this 30 minute visit was spent on counseling, explanation of diagnosis of syncope events likely related to dehydration most recently, discussion with patient and   coordination of care and answering all questions to patient`s  satisfaction  Antony Contras, MD  [facility] [address] [place], [place] [address]  Phone [phone] Fax [phone] Note: This document was prepared with digital dictation and possible smart phrase technology. Any transcriptional errors that result from this process are unintentional.

## 2020-11-19 NOTE — Patient Instructions (Signed)
I had a long discussion with the patient regarding her recurrent episodes of syncope with negative work-up for seizures and neurovascular stenosis in the past.  Recommend repeat EEG as well as carotid ultrasound transcranial Doppler with as it has been a while since they were checked.  Continue Plavix for stroke prevention and maintain aggressive risk factor modification to strict control of hypertension with blood pressure goal below 130/90, lipids with LDL cholesterol goal below 70 mg percent and diabetes with hemoglobin A1c goal below 6.5%.  She was advised to return for follow-up in 6 months with my nurse practitioner Janett Billow call earlier if necessary.

## 2020-11-20 ENCOUNTER — Telehealth: Payer: Self-pay | Admitting: Neurology

## 2020-11-20 ENCOUNTER — Ambulatory Visit (INDEPENDENT_AMBULATORY_CARE_PROVIDER_SITE_OTHER): Payer: Medicare Other | Admitting: Neurology

## 2020-11-20 DIAGNOSIS — R55 Syncope and collapse: Secondary | ICD-10-CM | POA: Diagnosis not present

## 2020-11-20 NOTE — Telephone Encounter (Signed)
[employer] medicare auth: [employer] spoke to [place] Ref # [id number]. Patient is scheduled for 11/27/20

## 2020-11-21 ENCOUNTER — Telehealth: Payer: Self-pay | Admitting: Emergency Medicine

## 2020-11-21 NOTE — Progress Notes (Signed)
Kindly inform the patient that EEG study shows mild slowing of the brain activity which is unexpected findings in patients with memory loss.  No definite seizure activity or worrisome finding

## 2020-11-21 NOTE — Telephone Encounter (Signed)
Called and reached patient, discussed Dr Clydene Fake findings regarding EEG results.  Patient denied any questions and verbalized appreciation.

## 2020-11-27 ENCOUNTER — Ambulatory Visit (HOSPITAL_COMMUNITY)
Admission: RE | Admit: 2020-11-27 | Discharge: 2020-11-27 | Disposition: A | Discharge status: Home / Self Care | Payer: Medicare Other | Source: Ambulatory Visit | Attending: Neurology | Admitting: Neurology

## 2020-11-27 ENCOUNTER — Other Ambulatory Visit: Payer: Self-pay

## 2020-11-27 ENCOUNTER — Ambulatory Visit (HOSPITAL_BASED_OUTPATIENT_CLINIC_OR_DEPARTMENT_OTHER)
Admission: RE | Admit: 2020-11-27 | Discharge: 2020-11-27 | Disposition: A | Discharge status: Home / Self Care | Payer: Medicare Other | Source: Ambulatory Visit | Attending: Neurology | Admitting: Neurology

## 2020-11-27 DIAGNOSIS — Z8673 Personal history of transient ischemic attack (TIA), and cerebral infarction without residual deficits: Secondary | ICD-10-CM

## 2020-11-27 DIAGNOSIS — R55 Syncope and collapse: Secondary | ICD-10-CM

## 2020-11-27 NOTE — Progress Notes (Signed)
Kindly inform the patient that transcranial Doppler study was suboptimal due to difficulty in obtaining all the blood vessels.  The blood vessels which were obtained all appeared to be normal.  Nothing to worry about

## 2020-11-27 NOTE — Progress Notes (Signed)
Carotid duplex & TCD have been completed.   Preliminary results in CV Proc.   Abram Sander 11/27/2020 1:26 PM

## 2020-11-27 NOTE — Progress Notes (Signed)
Kindly inform the patient that carotid ultrasound showed no significant blockages in either carotid artery in the neck.

## 2020-12-03 ENCOUNTER — Telehealth: Payer: Self-pay | Admitting: *Deleted

## 2020-12-03 NOTE — Telephone Encounter (Signed)
Spoke with patient and discussed the results of carotid doppler and transcranial doppler as noted below by Dr Leonie Man. Pt verbalized understanding. I don't believe the patient remembered being called about her EEG results so I reviewed those again with her as well. She verbalized appreciation and her questions were answered.   Nancy Fila, MD  11/27/2020 3:18 PM EST Back to Top     Kindly inform the patient that carotid ultrasound showed no significant blockages in either carotid artery in the neck.   Nancy Fila, MD  11/27/2020 3:17 PM EST Back to Top     Kindly inform the patient that transcranial Doppler study was suboptimal due to difficulty in obtaining all the blood vessels. The blood vessels which were obtained all appeared to be normal. Nothing to worry about

## 2020-12-10 ENCOUNTER — Encounter: Payer: Self-pay | Admitting: Family

## 2020-12-10 ENCOUNTER — Other Ambulatory Visit: Payer: Self-pay

## 2020-12-10 ENCOUNTER — Ambulatory Visit (INDEPENDENT_AMBULATORY_CARE_PROVIDER_SITE_OTHER): Payer: Medicare Other | Admitting: Family

## 2020-12-10 ENCOUNTER — Telehealth: Payer: Self-pay | Admitting: Family

## 2020-12-10 VITALS — BP 126/52 | HR 60 | Temp 98.2°F | Resp 16 | Ht 65.0 in | Wt 114.6 lb

## 2020-12-10 DIAGNOSIS — M059 Rheumatoid arthritis with rheumatoid factor, unspecified: Secondary | ICD-10-CM | POA: Diagnosis not present

## 2020-12-10 DIAGNOSIS — J069 Acute upper respiratory infection, unspecified: Secondary | ICD-10-CM | POA: Diagnosis not present

## 2020-12-10 DIAGNOSIS — D509 Iron deficiency anemia, unspecified: Secondary | ICD-10-CM | POA: Diagnosis not present

## 2020-12-10 DIAGNOSIS — E039 Hypothyroidism, unspecified: Secondary | ICD-10-CM | POA: Diagnosis not present

## 2020-12-10 DIAGNOSIS — R197 Diarrhea, unspecified: Secondary | ICD-10-CM | POA: Diagnosis not present

## 2020-12-10 DIAGNOSIS — E119 Type 2 diabetes mellitus without complications: Secondary | ICD-10-CM

## 2020-12-10 DIAGNOSIS — E538 Deficiency of other specified B group vitamins: Secondary | ICD-10-CM

## 2020-12-10 DIAGNOSIS — E785 Hyperlipidemia, unspecified: Secondary | ICD-10-CM | POA: Diagnosis not present

## 2020-12-10 DIAGNOSIS — Z8673 Personal history of transient ischemic attack (TIA), and cerebral infarction without residual deficits: Secondary | ICD-10-CM

## 2020-12-10 LAB — LIPID PANEL
Cholesterol: 181 mg/dL (ref 0–200)
HDL: 40.9 mg/dL (ref >39.00)
LDL Cholesterol: 122 mg/dL — ABNORMAL HIGH (ref 0–99)
NonHDL: 140.5
Total CHOL/HDL Ratio: 4
Triglycerides: 92 mg/dL (ref 0.0–149.0)
VLDL: 18.4 mg/dL (ref 0.0–40.0)

## 2020-12-10 LAB — HEMOGLOBIN A1C: Hgb A1c MFr Bld: 5.8 % (ref 4.6–6.5)

## 2020-12-10 MED ORDER — CYANOCOBALAMIN 1000 MCG/ML IJ SOLN
1000.0000 ug | Freq: Once | INTRAMUSCULAR | Status: AC
  Administered 2020-12-10: 1000 ug via INTRAMUSCULAR

## 2020-12-10 MED ORDER — ROSUVASTATIN CALCIUM 40 MG PO TABS: 40.0000 mg | ORAL_TABLET | Freq: Every day | ORAL | 1 refills | Status: DC

## 2020-12-10 NOTE — Progress Notes (Signed)
Subjective:    [name], female    DOB: [birth year], 80 y.o.   MRN: [id number]  HPI  Patient is a 80 yr old female who presents today for follow up.  HTN- maintained on coreg 25mg , clonidine 0.1mg  BP Readings from Last 3 Encounters:  12/10/20 (!) 126/52  11/19/20 (!) 147/64  10/17/20 (!) 148/70   Hypothryoid- Lab Results  Component Value Date   TSH 0.78 10/31/2020   Hyperlipidemia- maintained on atorvastatin 80mg .  Lab Results  Component Value Date   CHOL 186 03/26/2020   HDL 43.20 03/26/2020   LDLCALC 116 (H) 03/26/2020   TRIG 133.0 03/26/2020   CHOLHDL 4 03/26/2020   Hx of CVA- not on aspirin.   Iron deficiency anemia-  Lab Results  Component Value Date   WBC 6.9 03/25/2020   HGB 11.2 (L) 03/25/2020   HCT 34.6 (L) 03/25/2020   MCV 88.6 03/25/2020   PLT 234.0 03/25/2020   Hyperglycemia-  Lab Results  Component Value Date   HGBA1C 6.0 02/06/2020   Reports a runny nose, cough.  Started >1 week ago.  She has not tested for COVID.  She has had 3 covid vaccines.    Has had some loose bowels for the last 2 weeks.  No recent abx.  Has been using depends due to some fecal leakage.  She tried some immodium which stops it for a while.  Reports that this has been happening off and on for some time. Last time this happened we did stools studies which were negative.    Review of Systems See HPI  Past Medical History:  Diagnosis Date  . Allergy   . Anemia    iron deficiency  . 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 Cataract   . Colon polyps   . CVA (cerebral vascular accident) ([place]) 02/2017  . Depression   . Hyperlipidemia   . Hypertension   . Osteoporosis 01/01/2015  . Rheumatoid arteritis ([place])   . Thyroid disease   . TIA (transient ischemic attack) 2016  . Urinary incontinence      Social History   Socioeconomic History  . Marital status: Divorced    Spouse name: Not on file  . Number of children: 8  . Years of education: Not on file  .  Highest education level: Not on file  Occupational History  . Occupation: retired    Fish farm manager: RETIRED    Comment: Retired from [employer]  . Smoking status: Current Some Day Smoker    Packs/day: 0.50    Years: 32.00    Pack years: 16.00    Types: Cigarettes  . Smokeless tobacco: Never Used  . Tobacco comment: 6 or 7 per day  Vaping Use  . Vaping Use: Never used  Substance and Sexual Activity  . Alcohol use: No    Alcohol/week: 0.0 standard drinks  . Drug use: No  . Sexual activity: Never  Other Topics Concern  . Not on file  Social History Narrative   Lives alone   Right Handed    Drinks 6-8 cups caffeine daily   Social Determinants of Health   Financial Resource Strain: Low Risk   . Difficulty of Paying Living Expenses: Not hard at all  Food Insecurity: No Food Insecurity  . Worried About Charity fundraiser in the Last Year: Never true  . Ran Out of Food in the Last Year: Never true  Transportation Needs: No Transportation Needs  .  Lack of Transportation (Medical): No  . Lack of Transportation (Non-Medical): No  Physical Activity: Insufficiently Active  . Days of Exercise per Week: 2 days  . Minutes of Exercise per Session: 30 min  Stress: No Stress Concern Present  . Feeling of Stress : Not at all  Social Connections: Moderately Isolated  . Frequency of Communication with Friends and Family: More than three times a week  . Frequency of Social Gatherings with Friends and Family: More than three times a week  . Attends Religious Services: More than 4 times per year  . Active Member of Clubs or Organizations: No  . Attends Archivist Meetings: Never  . Marital Status: Divorced  Human resources officer Violence: Not At Risk  . Fear of Current or Ex-Partner: No  . Emotionally Abused: No  . Physically Abused: No  . Sexually Abused: No    Past Surgical History:  Procedure Laterality Date  . ABDOMINAL HYSTERECTOMY  1976  . CATARACT  EXTRACTION Bilateral   . LOWER EXTREMITY ANGIOGRAPHY N/A 08/29/2019   Procedure: LOWER EXTREMITY ANGIOGRAPHY;  Surgeon: Serafina Mitchell, MD;  Location: [facility];  Service: Cardiovascular;  Laterality: N/A;  . PERIPHERAL VASCULAR INTERVENTION Right 08/29/2019   Procedure: PERIPHERAL VASCULAR INTERVENTION;  Surgeon: Serafina Mitchell, MD;  Location: [facility];  Service: Cardiovascular;  Laterality: Right;  . 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 Hypertension Father   . Colon cancer Father   . Ulcerative colitis Mother   . Parkinson's disease Mother   . Heart disease Sister 79       CABG x 3  . Cirrhosis Brother   . Alcohol abuse Brother   . Colitis Daughter   . Colon polyps Daughter   . Heart attack Daughter   . Hypertension Son   . Kidney disease Son        transplant due to kidney problems after [facility]  . Arthritis Other   . Coronary artery disease Other   . Hyperlipidemia Other   . Hypertension Other   . Stroke Sister        died 21  . Arthritis Sister   . Hypertension Sister   . Thyroid disease Daughter        x 3  . Cirrhosis Brother 65       ETOH abuse    Allergies  Allergen Reactions  . Iron Nausea And Vomiting    Can take infusions   . Norvasc [Amlodipine] Other (See Comments)    dizziness    Current Outpatient Medications on File Prior to Visit  Medication Sig Dispense Refill  . Adalimumab 40 MG/0.4ML PNKT Inject 40 mg into the skin once a week. Saturday    . atorvastatin (LIPITOR) 80 MG tablet TAKE 1 TABLET BY MOUTH EVERY DAY 90 tablet 1  . carvedilol (COREG) 25 MG tablet TAKE 1 TABLET BY MOUTH TWICE A DAY 180 tablet 1  . cloNIDine (CATAPRES) 0.1 MG tablet TAKE 1 TABLET BY MOUTH 3 TIMES DAILY. 60 tablet 2  . clopidogrel (PLAVIX) 75 MG tablet TAKE 1 TABLET BY MOUTH EVERY DAY 90 tablet 1  . Docusate Calcium (STOOL SOFTENER PO) Take 1 tablet by mouth daily as needed  (constipation).     Marland Kitchen escitalopram (LEXAPRO) 5 MG tablet Take 1 tablet (5 mg total) by mouth daily. 90 tablet 0  . fluticasone (FLONASE) 50  MCG/ACT nasal spray Place 1 spray into both nostrils daily as needed for allergies or rhinitis.    Marland Kitchen gabapentin (NEURONTIN) 100 MG capsule Take 1 capsule (100 mg total) by mouth 3 (three) times daily. 90 capsule 3  . hydrALAZINE (APRESOLINE) 25 MG tablet Take 1 tablet (25 mg total) by mouth 3 (three) times daily. 90 tablet 5  . leflunomide (ARAVA) 20 MG tablet Take 1 tablet (20 mg total) by mouth daily.    Marland Kitchen levothyroxine (SYNTHROID) 100 MCG tablet TAKE 1 TABLET BY MOUTH EVERY DAY 90 tablet 1  . losartan (COZAAR) 100 MG tablet TAKE 1 TABLET BY MOUTH EVERY DAY 90 tablet 1  . mycophenolate (CELLCEPT) 500 MG tablet Take 500 mg by mouth daily.    . ondansetron (ZOFRAN) 4 MG tablet Take 1 tablet (4 mg total) by mouth every 8 (eight) hours as needed for nausea or vomiting. 20 tablet 0  . prednisoLONE acetate (PRED FORTE) 1 % ophthalmic suspension Place 1 drop into both eyes daily.     . predniSONE (DELTASONE) 5 MG tablet Take 5 mg by mouth daily.    Marland Kitchen PROLIA 60 MG/ML SOSY injection Inject 60 mg as directed every 6 (six) months.     Current Facility-Administered Medications on File Prior to Visit  Medication Dose Route Frequency Provider Last Rate Last Admin  . cyanocobalamin ((VITAMIN B-12)) injection 1,000 mcg  1,000 mcg Intramuscular Q30 days Debbrah Alar, NP   1,000 mcg at 01/05/20 1109  . cyanocobalamin ((VITAMIN B-12)) injection 1,000 mcg  1,000 mcg Intramuscular Q30 days Debbrah Alar, NP   1,000 mcg at 11/12/20 1038    BP (!) 126/52 (BP Location: Right Arm, Patient Position: Sitting, Cuff Size: Small)   Pulse 60   Temp 98.2 F (36.8 C) (Oral)   Resp 16   Ht 5\' 5"  (1.651 m)   Wt 114 lb 9.6 oz (52 kg)   SpO2 100%   BMI 19.07 kg/m       Objective:   Physical Exam Constitutional:      Appearance: She is well-developed and  well-nourished.  Neck:     Thyroid: No thyromegaly.  Cardiovascular:     Rate and Rhythm: Normal rate and regular rhythm.     Heart sounds: Normal heart sounds. No murmur heard.   Pulmonary:     Effort: Pulmonary effort is normal. No respiratory distress.     Breath sounds: Normal breath sounds. No wheezing.  Musculoskeletal:     Cervical back: Neck supple.  Skin:    General: Skin is warm and dry.  Neurological:     Mental Status: She is alert and oriented to person, place, and time.  Psychiatric:        Mood and Affect: Mood and affect normal.        Behavior: Behavior normal.        Thought Content: Thought content normal.        Judgment: Judgment normal.           Assessment & Plan:  HTN- bp is at goal. Continue coreg 25mg , clonidine 0.1mg , hydralazine 25mg  TID, losartan 100mg .  Chronic diarrhea- will refer to gi or further evaluation. Advised pt to continue imodium prn.   Hyperlipidemia- check follow up lipid panel. Continue atorvastatin 80mg .   Hyperglycemia- check A1C.  Viral URI- I did suggest that she be tested for covid and let me know if her symptoms do not continue to improve.  B12 deficiency- b12 injection 1056mcg today.  Iron deficiency anemia- cbc has been stable over the last 2 years.   Hx of CVA-on plavix 75mg  once daily, statin therapy. Unfortunately, she continues to smoke.  Hypothyroid- clinically stable on synthroid 17mcg. Continue same. Will need TSH next visit.   RA- clinically stable. Management per rheumatology.    This visit occurred during the SARS-CoV-2 public health emergency.  Safety protocols were in place, including screening questions prior to the visit, additional usage of staff PPE, and extensive cleaning of exam room while observing appropriate contact time as indicated for disinfecting solutions.

## 2020-12-10 NOTE — Telephone Encounter (Signed)
Cholesterol is above goal. I would like her to stop atorvastatin and start crestor 40mg  once daily.

## 2020-12-11 NOTE — Telephone Encounter (Signed)
Patient advised of results, provider's comments and prescription change. She verbalized understanding.

## 2020-12-12 ENCOUNTER — Ambulatory Visit: Payer: Medicare Other | Admitting: Gastroenterology

## 2020-12-13 ENCOUNTER — Ambulatory Visit: Payer: Medicare Other

## 2020-12-25 ENCOUNTER — Ambulatory Visit (INDEPENDENT_AMBULATORY_CARE_PROVIDER_SITE_OTHER): Payer: Medicare Other | Admitting: Gastroenterology

## 2020-12-25 ENCOUNTER — Encounter: Payer: Self-pay | Admitting: Gastroenterology

## 2020-12-25 ENCOUNTER — Other Ambulatory Visit (INDEPENDENT_AMBULATORY_CARE_PROVIDER_SITE_OTHER): Payer: Medicare Other

## 2020-12-25 ENCOUNTER — Other Ambulatory Visit: Payer: Self-pay

## 2020-12-25 ENCOUNTER — Ambulatory Visit (INDEPENDENT_AMBULATORY_CARE_PROVIDER_SITE_OTHER)
Admission: RE | Admit: 2020-12-25 | Discharge: 2020-12-25 | Disposition: A | Discharge status: Home / Self Care | Payer: Medicare Other | Source: Ambulatory Visit | Attending: Gastroenterology | Admitting: Gastroenterology

## 2020-12-25 VITALS — BP 142/62 | HR 68 | Ht 63.0 in | Wt 114.0 lb

## 2020-12-25 DIAGNOSIS — R140 Abdominal distension (gaseous): Secondary | ICD-10-CM

## 2020-12-25 DIAGNOSIS — R109 Unspecified abdominal pain: Secondary | ICD-10-CM

## 2020-12-25 DIAGNOSIS — R197 Diarrhea, unspecified: Secondary | ICD-10-CM

## 2020-12-25 LAB — CBC WITH DIFFERENTIAL/PLATELET
Basophils Absolute: 0 10*3/uL (ref 0.0–0.1)
Basophils Relative: 0.4 % (ref 0.0–3.0)
Eosinophils Absolute: 0.3 10*3/uL (ref 0.0–0.7)
Eosinophils Relative: 3.5 % (ref 0.0–5.0)
HCT: 34.9 % — ABNORMAL LOW (ref 36.0–46.0)
Hemoglobin: 11.4 g/dL — ABNORMAL LOW (ref 12.0–15.0)
Lymphocytes Relative: 35.2 % (ref 12.0–46.0)
Lymphs Abs: 3.1 10*3/uL (ref 0.7–4.0)
MCHC: 32.7 g/dL (ref 30.0–36.0)
MCV: 86.2 fl (ref 78.0–100.0)
Monocytes Absolute: 0.9 10*3/uL (ref 0.1–1.0)
Monocytes Relative: 9.7 % (ref 3.0–12.0)
Neutro Abs: 4.5 10*3/uL (ref 1.4–7.7)
Neutrophils Relative %: 51.2 % (ref 43.0–77.0)
Platelets: 283 10*3/uL (ref 150.0–400.0)
RBC: 4.05 Mil/uL (ref 3.87–5.11)
RDW: 14.3 % (ref 11.5–15.5)
WBC: 8.9 10*3/uL (ref 4.0–10.5)

## 2020-12-25 LAB — COMPREHENSIVE METABOLIC PANEL
ALT: 15 U/L (ref 0–35)
AST: 13 U/L (ref 0–37)
Albumin: 3.8 g/dL (ref 3.5–5.2)
Alkaline Phosphatase: 50 U/L (ref 39–117)
BUN: 12 mg/dL (ref 6–23)
CO2: 28 mEq/L (ref 19–32)
Calcium: 8.6 mg/dL (ref 8.4–10.5)
Chloride: 105 mEq/L (ref 96–112)
Creatinine, Ser: 0.89 mg/dL (ref 0.40–1.20)
GFR: 61.51 mL/min (ref >60.00)
Glucose, Bld: 86 mg/dL (ref 70–99)
Potassium: 4.1 mEq/L (ref 3.5–5.1)
Sodium: 140 mEq/L (ref 135–145)
Total Bilirubin: 0.5 mg/dL (ref 0.2–1.2)
Total Protein: 7.4 g/dL (ref 6.0–8.3)

## 2020-12-25 MED ORDER — COLESTIPOL HCL 1 G PO TABS: 1.0000 g | ORAL_TABLET | Freq: Every day | ORAL | 1 refills | Status: DC

## 2020-12-25 NOTE — Patient Instructions (Addendum)
Your provider has requested that you have an abdominal x ray before leaving today. Please go to the basement floor to our Radiology department for the test.  Your provider has requested that you go to the basement level for lab work before leaving today. Press "B" on the elevator. The lab is located at the first door on the left as you exit the elevator.   Take Benefiber 1 tablespoon three times a day with meals  We will send Colestid to your pharmacy (Avoid taking it within 2 hours of other medications)   Lactose-Free Diet, Adult If you have lactose intolerance, you are not able to digest lactose. Lactose is a natural sugar found mainly in dairy milk and dairy products. You may need to avoid all foods and beverages that contain lactose. A lactose-free diet can help you do this. Which foods have lactose? Lactose is found in dairy milk and dairy products, such as:  Yogurt.  Cheese.  Butter.  Margarine.  Sour cream.  Cream.  Whipped toppings and nondairy creamers.  Ice cream and other dairy-based desserts. Lactose is also found in foods or products made with dairy milk or milk ingredients. To find out whether a food contains dairy milk or a milk ingredient, look at the ingredients list. Avoid foods with the statement "May contain milk" and foods that contain:  Milk powder.  Whey.  Curd.  Caseinate.  Lactose.  Lactalbumin.  Lactoglobulin. What are alternatives to dairy milk and foods made with milk products?  Lactose-free milk.  Soy milk with added calcium and vitamin D.  Almond milk, coconut milk, rice milk, or other nondairy milk alternatives with added calcium and vitamin D. Note that these are low in protein.  Soy products, such as soy yogurt, soy cheese, soy ice cream, and soy-based sour cream.  Other nut milk products, such as almond yogurt, almond cheese, cashew yogurt, cashew cheese, cashew ice cream, coconut yogurt, and coconut ice cream. What are tips  for following this plan?  Do not consume foods, beverages, vitamins, minerals, or medicines containing lactose. Read ingredient lists carefully.  Look for the words "lactose-free" on labels.  Use lactase enzyme drops or tablets as directed by your health care provider.  Use lactose-free milk or a milk alternative, such as soy milk or almond milk, for drinking and cooking.  Make sure you get enough calcium and vitamin D in your diet. A lactose-free eating plan can be lacking in these important nutrients.  Take calcium and vitamin D supplements as directed by your health care provider. Talk to your health care provider about supplements if you are not able to get enough calcium and vitamin D from food. What foods can I eat? Fruits All fresh, canned, frozen, or dried fruits that are not processed with lactose. Vegetables All fresh, frozen, and canned vegetables without cheese, cream, or butter sauces. Grains Any that are not made with dairy milk or dairy products. Meats and other proteins Any meat, fish, poultry, and other protein sources that are not made with dairy milk or dairy products. Soy cheese and yogurt. Fats and oils Any that are not made with dairy milk or dairy products. Beverages Lactose-free milk. Soy, rice, or almond milk with added calcium and vitamin D. Fruit and vegetable juices. Sweets and desserts Any that are not made with dairy milk or dairy products. Seasonings and condiments Any that are not made with dairy milk or dairy products. Calcium Calcium is found in many foods that contain lactose  and is important for bone health. The amount of calcium you need depends on your age:  Adults younger than 50 years: 1,000 mg of calcium a day.  Adults older than 50 years: 1,200 mg of calcium a day. If you are not getting enough calcium, you may get it from other sources, including:  Orange juice with calcium added. There are 300-350 mg of calcium in 1 cup of orange  juice.  Calcium-fortified soy milk. There are 300-400 mg of calcium in 1 cup of calcium-fortified soy milk.  Calcium-fortified rice or almond milk. There are 300 mg of calcium in 1 cup of calcium-fortified rice or almond milk.  Calcium-fortified breakfast cereals. There are 100-1,000 mg of calcium in calcium-fortified breakfast cereals.  Spinach, cooked. There are 145 mg of calcium in  cup of cooked spinach.  Edamame, cooked. There are 130 mg of calcium in  cup of cooked edamame.  Collard greens, cooked. There are 125 mg of calcium in  cup of cooked collard greens.  Kale, frozen or cooked. There are 90 mg of calcium in  cup of cooked or frozen kale.  Almonds. There are 95 mg of calcium in  cup of almonds.  Broccoli, cooked. There are 60 mg of calcium in 1 cup of cooked broccoli. The items listed above may not be a complete list of recommended foods and beverages. Contact a dietitian for more options.   What foods are not recommended? Fruits None, unless they are made with dairy milk or dairy products. Vegetables None, unless they are made with dairy milk or dairy products. Grains Any grains that are made with dairy milk or dairy products. Meats and other proteins None, unless they are made with dairy milk or dairy products. Dairy All dairy products, including milk, goat's milk, buttermilk, kefir, acidophilus milk, flavored milk, evaporated milk, condensed milk, dulce de [place], eggnog, yogurt, cheese, and cheese spreads. Fats and oils Any that are made with milk or milk products. Margarines and salad dressings that contain milk or cheese. Cream. Half and half. Cream cheese. Sour cream. Chip dips made with sour cream or yogurt. Beverages Hot chocolate. Cocoa with lactose. Instant iced teas. Powdered fruit drinks. Smoothies made with dairy milk or yogurt. Sweets and desserts Any that are made with milk or milk products. Seasonings and condiments Chewing gum that has lactose.  Spice blends if they contain lactose. Artificial sweeteners that contain lactose. Nondairy creamers. The items listed above may not be a complete list of foods and beverages to avoid. Contact a dietitian for more information. Summary  If you are lactose intolerant, it means that you have a hard time digesting lactose, a natural sugar found in milk and milk products.  Following a lactose-free diet can help you manage this condition.  Calcium is important for bone health and is found in many foods that contain lactose. Talk with your health care provider about other sources of calcium. This information is not intended to replace advice given to you by your health care provider. Make sure you discuss any questions you have with your health care provider. Document Revised: 11/23/2017 Document Reviewed: 11/23/2017 Elsevier Patient Education  2021 [employer].   Due to recent changes in healthcare laws, you may see the results of your imaging and laboratory studies on MyChart before your provider has had a chance to review them.  We understand that in some cases there may be results that are confusing or concerning to you. Not all laboratory results come back  in the same time frame and the provider may be waiting for multiple results in order to interpret others.  Please give [place] 48 hours in order for your provider to thoroughly review all the results before contacting the office for clarification of your results.   I appreciate the  opportunity to care for you  Thank You   Harl Bowie , MD

## 2020-12-25 NOTE — Progress Notes (Signed)
Nancy [name]    [id number]    05/27/1941  Primary Care Physician:O'Sullivan, Lenna Sciara, NP  Referring Physician: Debbrah Alar, NP [address] [place],  [place] [address]   Chief complaint:  Diarrhea  HPI:  80 year old very pleasant female with history of RA on immunosuppressive therapy (Humira) here for follow-up visit for intermittent diarrhea and excessive gas  She has alternative constipation and diarrhea. She has watery stool for few days and sometimes can go for a week without a bowel movement.  She has associated left lower quadrant abdominal discomfort when she does not have a bowel movement for many days.  She is using Imodium as needed  She had mild elevation in transaminases and lipase in May, 2021  Denies any severe upper abdominal pain, vomiting or nausea.  No weight loss or decreased appetite.  CT abdomen pelvis Mar 27, 2020: No acute abnormality.  Cluster of small bowel in mid abdomen with minimal overlying in mid mesentery, possible internal hernia.  No bowel obstruction, free fluid or inflammatory changes  Colonoscopy October 24, 2015: Normal, no recall due to age   Outpatient Encounter Medications as of 12/25/2020  Medication Sig  . Adalimumab 40 MG/0.4ML PNKT Inject 40 mg into the skin once a week. Saturday  . carvedilol (COREG) 25 MG tablet TAKE 1 TABLET BY MOUTH TWICE A DAY  . cloNIDine (CATAPRES) 0.1 MG tablet TAKE 1 TABLET BY MOUTH 3 TIMES DAILY.  Marland Kitchen clopidogrel (PLAVIX) 75 MG tablet TAKE 1 TABLET BY MOUTH EVERY DAY  . Docusate Calcium (STOOL SOFTENER PO) Take 1 tablet by mouth daily as needed (constipation).   Marland Kitchen escitalopram (LEXAPRO) 5 MG tablet Take 1 tablet (5 mg total) by mouth daily.  . fluticasone (FLONASE) 50 MCG/ACT nasal spray Place 1 spray into both nostrils daily as needed for allergies or rhinitis.  Marland Kitchen gabapentin (NEURONTIN) 100 MG capsule Take 1 capsule (100 mg total) by mouth 3 (three) times daily.  .  hydrALAZINE (APRESOLINE) 25 MG tablet Take 1 tablet (25 mg total) by mouth 3 (three) times daily.  Marland Kitchen leflunomide (ARAVA) 20 MG tablet Take 1 tablet (20 mg total) by mouth daily.  Marland Kitchen losartan (COZAAR) 100 MG tablet TAKE 1 TABLET BY MOUTH EVERY DAY  . mycophenolate (CELLCEPT) 500 MG tablet Take 500 mg by mouth daily.  . ondansetron (ZOFRAN) 4 MG tablet Take 1 tablet (4 mg total) by mouth every 8 (eight) hours as needed for nausea or vomiting.  . prednisoLONE acetate (PRED FORTE) 1 % ophthalmic suspension Place 1 drop into both eyes daily.   . predniSONE (DELTASONE) 5 MG tablet Take 5 mg by mouth daily as needed.  Marland Kitchen PROLIA 60 MG/ML SOSY injection Inject 60 mg as directed every 6 (six) months.  . rosuvastatin (CRESTOR) 40 MG tablet Take 1 tablet (40 mg total) by mouth daily.  . [DISCONTINUED] levothyroxine (SYNTHROID) 100 MCG tablet TAKE 1 TABLET BY MOUTH EVERY DAY   Facility-Administered Encounter Medications as of 12/25/2020  Medication  . cyanocobalamin ((VITAMIN B-12)) injection 1,000 mcg  . cyanocobalamin ((VITAMIN B-12)) injection 1,000 mcg    Allergies as of 12/25/2020 - Review Complete 12/25/2020  Allergen Reaction Noted  . Iron Nausea And Vomiting 10/10/2015  . Norvasc [amlodipine] Other (See Comments) 06/07/2015    Past Medical History:  Diagnosis Date  . Allergy   . Anemia    iron deficiency  . 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  Cataract   . Colon polyps   . CVA (cerebral vascular accident) ([place] [facility]) 02/2017  . Depression   . Hyperlipidemia   . Hypertension   . Osteoporosis 01/01/2015  . 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 Thyroid disease   . TIA (transient ischemic attack) 2016  . Urinary incontinence     Past Surgical History:  Procedure Laterality Date  . ABDOMINAL HYSTERECTOMY  1976  . CATARACT EXTRACTION Bilateral   . LOWER EXTREMITY ANGIOGRAPHY N/A 08/29/2019   Procedure: LOWER EXTREMITY ANGIOGRAPHY;  Surgeon: Serafina Mitchell, MD;  Location: [facility];  Service:  Cardiovascular;  Laterality: N/A;  . PERIPHERAL VASCULAR INTERVENTION Right 08/29/2019   Procedure: PERIPHERAL VASCULAR INTERVENTION;  Surgeon: Serafina Mitchell, MD;  Location: [facility];  Service: Cardiovascular;  Laterality: Right;  . 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 Hypertension Father   . Colon cancer Father   . Ulcerative colitis Mother   . Parkinson's disease Mother   . Heart disease Sister 35       CABG x 3  . Cirrhosis Brother   . Alcohol abuse Brother   . Colitis Daughter   . Colon polyps Daughter   . Heart attack Daughter   . Hypertension Son   . Kidney disease Son        transplant due to kidney problems after [facility]  . Arthritis Other   . Coronary artery disease Other   . Hyperlipidemia Other   . Hypertension Other   . Stroke Sister        died 94  . Arthritis Sister   . Hypertension Sister   . Thyroid disease Daughter        x 3  . Cirrhosis Brother 11       ETOH abuse    Social History   Socioeconomic History  . Marital status: Divorced    Spouse name: Not on file  . Number of children: 8  . Years of education: Not on file  . Highest education level: Not on file  Occupational History  . Occupation: retired    Fish farm manager: RETIRED    Comment: Retired from [employer]  . Smoking status: Current Some Day Smoker    Packs/day: 0.50    Years: 32.00    Pack years: 16.00    Types: Cigarettes  . Smokeless tobacco: Never Used  . Tobacco comment: 6 or 7 per day  Vaping Use  . Vaping Use: Never used  Substance and Sexual Activity  . Alcohol use: No    Alcohol/week: 0.0 standard drinks  . Drug use: No  . Sexual activity: Never  Other Topics Concern  . Not on file  Social History Narrative   Lives alone   Right Handed    Drinks 6-8 cups caffeine daily   Social Determinants of Health   Financial Resource Strain: Low Risk   . Difficulty of Paying Living  Expenses: Not hard at all  Food Insecurity: No Food Insecurity  . Worried About Charity fundraiser in the Last Year: Never true  . Ran Out of Food in the Last Year: Never true  Transportation Needs: No Transportation Needs  . Lack of Transportation (Medical): No  . Lack of Transportation (Non-Medical): No  Physical Activity: Insufficiently Active  . Days of Exercise per Week: 2 days  . Minutes of Exercise per  Session: 30 min  Stress: No Stress Concern Present  . Feeling of Stress : Not at all  Social Connections: Moderately Isolated  . Frequency of Communication with Friends and Family: More than three times a week  . Frequency of Social Gatherings with Friends and Family: More than three times a week  . Attends Religious Services: More than 4 times per year  . Active Member of Clubs or Organizations: No  . Attends Archivist Meetings: Never  . Marital Status: Divorced  Human resources officer Violence: Not At Risk  . Fear of Current or Ex-Partner: No  . Emotionally Abused: No  . Physically Abused: No  . Sexually Abused: No      Review of systems: All other review of systems negative except as mentioned in the HPI.   Physical Exam: Vitals:   12/25/20 1526  BP: (!) 142/62  Pulse: 68  SpO2: 98%   Body mass index is 20.19 kg/m. Gen:      No acute distress HEENT:  sclera anicteric Abd:      soft, non-tender; no palpable masses, no distension Ext:    No edema Neuro: alert and oriented x 3 Psych: normal mood and affect  Data Reviewed:  Reviewed labs, radiology imaging, old records and pertinent past GI work up   Assessment and Plan/Recommendations:  80 year old very pleasant female with history of rheumatoid arthritis on chronic immunosuppressive therapy (Humira) with complaints of alternating constipation and diarrhea Will obtain abdominal two-view x-ray to exclude increased stool burden and overflow diarrhea, if positive will plan to do bowel purge with  MiraLAX Add Benefiber 1 tablespoon 3 times daily with meals Trial of lactose-free diet  If continues to have persistent symptoms of excessive gas and diarrhea, add Colestid daily.  Advised patient to avoid taking it within 2 to 3 hours of other medication to avoid drug interaction  If continues to have persistent symptoms, will consider colonoscopy or repeat imaging with CT for further evaluation   The patient was provided an opportunity to ask questions and all were answered. The patient agreed with the plan and demonstrated an understanding of the instructions.  Damaris Hippo , MD    CC: Debbrah Alar, NP

## 2021-01-02 ENCOUNTER — Telehealth: Payer: Self-pay | Admitting: Pharmacist

## 2021-01-02 NOTE — Progress Notes (Addendum)
Chronic Care Management Pharmacy Assistant   Name: KASLYN RICHBURG  MRN: [id number] DOB: Mar 22, 1941  Reason for Encounter: Disease State For HTN.  Patient Questions:  1.  Have you seen any other providers since your last visit? Yes.   2.  Any changes in your medicines or health? Yes.   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 For Diarrhea. Referral to GI. No medication changes.  Consults: 12/25/20 Willa Frater, MD.  X-rays taken. Labs drawn. Per note: doctor advised a lactose free diet and to take Benefiber 1 tablespoon three times a day with meals STARTED Colestipol 1 g daily. STOPPED Levothyroxine 100 mg.  11/19/20 Neurology Garvin Fila, MD. Per note: doctor recommend a repeat EEG. No medication changes.   Allergies:   Allergies  Allergen Reactions   Iron Nausea And Vomiting    Can take infusions    Norvasc [Amlodipine] Other (See Comments)    dizziness    Medications: Outpatient Encounter Medications as of 01/02/2021  Medication Sig Note   Adalimumab 40 MG/0.4ML PNKT Inject 40 mg into the skin once a week. Saturday    carvedilol (COREG) 25 MG tablet TAKE 1 TABLET BY MOUTH TWICE A DAY    cloNIDine (CATAPRES) 0.1 MG tablet TAKE 1 TABLET BY MOUTH 3 TIMES DAILY.    clopidogrel (PLAVIX) 75 MG tablet TAKE 1 TABLET BY MOUTH EVERY DAY    colestipol (COLESTID) 1 g tablet Take 1 tablet (1 g total) by mouth daily.    Docusate Calcium (STOOL SOFTENER PO) Take 1 tablet by mouth daily as needed (constipation).     escitalopram (LEXAPRO) 5 MG tablet Take 1 tablet (5 mg total) by mouth daily.    fluticasone (FLONASE) 50 MCG/ACT nasal spray Place 1 spray into both nostrils daily as needed  for allergies or rhinitis.    gabapentin (NEURONTIN) 100 MG capsule Take 1 capsule (100 mg total) by mouth 3 (three) times daily. 10/24/2020: Takes as needed   hydrALAZINE (APRESOLINE) 25 MG tablet Take 1 tablet (25 mg total) by mouth 3 (three) times daily.    leflunomide (ARAVA) 20 MG tablet Take 1 tablet (20 mg total) by mouth daily.    losartan (COZAAR) 100 MG tablet TAKE 1 TABLET BY MOUTH EVERY DAY    mycophenolate (CELLCEPT) 500 MG tablet Take 500 mg by mouth daily.    ondansetron (ZOFRAN) 4 MG tablet Take 1 tablet (4 mg total) by mouth every 8 (eight) hours as needed for nausea or vomiting.    prednisoLONE acetate (PRED FORTE) 1 % ophthalmic suspension Place 1 drop into both eyes daily.     predniSONE (DELTASONE) 5 MG tablet Take 5 mg by mouth daily as needed.    PROLIA 60 MG/ML SOSY injection Inject 60 mg as directed every 6 (six) months.    rosuvastatin (CRESTOR) 40 MG tablet Take 1 tablet (40 mg total) by mouth daily.    Facility-Administered Encounter Medications as of 01/02/2021  Medication   cyanocobalamin ((VITAMIN B-12)) injection 1,000 mcg   cyanocobalamin ((VITAMIN B-12)) injection 1,000 mcg    Current Diagnosis: Patient Active Problem List   Diagnosis Date Noted   Abnormal CT of the chest 09/12/2019   Dyspnea 09/12/2019   Tendinitis of right rotator cuff 02/24/2018   History of CVA (cerebrovascular  accident) 03/16/2017   Hematuria 03/16/2017   Acute ischemic right middle cerebral artery (MCA) stroke ([facility]CC) 03/03/2017   Subacromial bursitis of left shoulder joint 08/07/2016   Drug therapy 01/07/2016   Generalized anxiety disorder 01/07/2016   Faintness 12/09/2015   Loss of weight 10/15/2015   Anemia, iron deficiency 09/30/2015   Hyperglycemia 06/07/2015   Near syncope 05/15/2015   Osteoporosis 01/01/2015   Expressive aphasia 12/16/2014   TIA (transient ischemic attack) 12/16/2014   Pain in joint, ankle and foot 09/06/2014   Constipation 09/06/2014   Rheumatoid  arthritis ([place]) 07/25/2014   Sciatica 01/31/2014   Hypothyroidism 01/12/2012   Back pain 01/11/2012   Edema 06/15/2011   ANEMIA, B12 DEFICIENCY 11/19/2010   ANXIETY 10/14/2010   MEMORY LOSS 10/14/2010   Hyperlipidemia 12/17/2009   Iron deficiency anemia 12/17/2009   TOBACCO ABUSE 12/17/2009   Depression 12/17/2009   Essential hypertension 12/17/2009   ALLERGIC RHINITIS 12/17/2009   ARTHRITIS, RHEUMATOID 12/17/2009   OSTEOPOROSIS 12/17/2009   URINARY INCONTINENCE 12/17/2009    Goals Addressed   None    Reviewed chart prior to disease state call. Spoke with patient regarding BP  Recent Office Vitals: BP Readings from Last 3 Encounters:  12/25/20 (!) 142/62  12/10/20 (!) 126/52  11/19/20 (!) 147/64   Pulse Readings from Last 3 Encounters:  12/25/20 68  12/10/20 60  11/19/20 70    Wt Readings from Last 3 Encounters:  12/25/20 114 lb (51.7 kg)  12/10/20 114 lb 9.6 oz (52 kg)  11/19/20 106 lb 12.8 oz (48.4 kg)     Kidney Function Lab Results  Component Value Date/Time   CREATININE 0.89 12/25/2020 04:34 PM   CREATININE 0.85 09/09/2020 10:33 AM   CREATININE 0.79 04/10/2020 11:09 AM   CREATININE 0.8 09/24/2015 11:13 AM   CREATININE 0.74 02/20/2013 08:54 AM   GFR 61.51 12/25/2020 04:34 PM   GFRNONAA 60 (L) 06/06/2019 11:43 AM   GFRAA >60 06/06/2019 11:43 AM    BMP Latest Ref Rng & Units 12/25/2020 09/09/2020 04/10/2020  Glucose 70 - 99 mg/dL 86 107(H) 80  BUN 6 - 23 mg/dL 12 12 11   Creatinine 0.40 - 1.20 mg/dL 0.89 0.85 0.79  BUN/Creat Ratio 6 - 22 (calc) - NOT APPLICABLE -  Sodium 161 - 145 mEq/L 140 143 137  Potassium 3.5 - 5.1 mEq/L 4.1 4.6 5.0  Chloride 96 - 112 mEq/L 105 107 102  CO2 19 - 32 mEq/L 28 27 30   Calcium 8.4 - 10.5 mg/dL 8.6 9.7 9.7    Current antihypertensive regimen:  Carvedilol 25 mg twice daily Clonidine 0.1 mg three times daily Hydralazine 25 mg three times daily Losartan 100 mg daily  How often are you checking your Blood Pressure?  daily   Current home BP readings: Patient stated her blood pressure has been running low.  What recent interventions/DTPs have been made by any provider to improve Blood Pressure control since last CPP Visit: None  Any recent hospitalizations or ED visits since last visit with CPP? Patient stated no.   What diet changes have been made to improve Blood Pressure Control?  Patient stated she doesn't eat a lot of meat. She stated she eats mainly chicken. She stated she eats a lot of green leafy foods.  What exercise is being done to improve your Blood Pressure Control?  Patient stated she doesn't do much exercising but she does everything around her house daily.  Adherence Review: Is the patient currently on ACE/ARB medication? Yes, Losartan 100  mg  Does the patient have >5 day gap between last estimated fill dates? No  Patent stated she doesn't have a copay on her medications but she is concerned about rather or not she's taking too much blood pressure medication. She stated she would like to talk to the pharmacist about seeing if she could reduce the amount of pills she take per day for her blood pressure to see If that would help.  Follow-Up:  Pharmacist Review   Charlann Lange, RMA Clinical Pharmacist Assistant [phone]  10 minutes spent in review, coordination, and documentation.  Reviewed by: Beverly Milch, PharmD Clinical Pharmacist [facility] [phone]

## 2021-01-06 DIAGNOSIS — H26491 Other secondary cataract, right eye: Secondary | ICD-10-CM | POA: Diagnosis not present

## 2021-01-06 DIAGNOSIS — H40013 Open angle with borderline findings, low risk, bilateral: Secondary | ICD-10-CM | POA: Diagnosis not present

## 2021-01-06 DIAGNOSIS — H15052 Scleromalacia perforans, left eye: Secondary | ICD-10-CM | POA: Diagnosis not present

## 2021-01-06 DIAGNOSIS — Z961 Presence of intraocular lens: Secondary | ICD-10-CM | POA: Diagnosis not present

## 2021-01-06 DIAGNOSIS — H31092 Other chorioretinal scars, left eye: Secondary | ICD-10-CM | POA: Diagnosis not present

## 2021-01-06 DIAGNOSIS — H43813 Vitreous degeneration, bilateral: Secondary | ICD-10-CM | POA: Diagnosis not present

## 2021-01-07 ENCOUNTER — Encounter (HOSPITAL_BASED_OUTPATIENT_CLINIC_OR_DEPARTMENT_OTHER): Payer: Self-pay

## 2021-01-07 ENCOUNTER — Other Ambulatory Visit: Payer: Self-pay

## 2021-01-07 ENCOUNTER — Ambulatory Visit (HOSPITAL_BASED_OUTPATIENT_CLINIC_OR_DEPARTMENT_OTHER)
Admission: RE | Admit: 2021-01-07 | Discharge: 2021-01-07 | Disposition: A | Discharge status: Home / Self Care | Payer: Medicare Other | Source: Ambulatory Visit | Attending: Family | Admitting: Family

## 2021-01-07 ENCOUNTER — Ambulatory Visit (INDEPENDENT_AMBULATORY_CARE_PROVIDER_SITE_OTHER): Payer: Medicare Other

## 2021-01-07 DIAGNOSIS — Z1231 Encounter for screening mammogram for malignant neoplasm of breast: Secondary | ICD-10-CM | POA: Insufficient documentation

## 2021-01-07 DIAGNOSIS — E538 Deficiency of other specified B group vitamins: Secondary | ICD-10-CM | POA: Diagnosis not present

## 2021-01-07 MED ORDER — CYANOCOBALAMIN 1000 MCG/ML IJ SOLN
1000.0000 ug | Freq: Once | INTRAMUSCULAR | Status: AC
  Administered 2021-01-07: 1000 ug via INTRAMUSCULAR

## 2021-01-07 NOTE — Progress Notes (Signed)
Pt here for monthly B12 injection per Debbrah Alar  B12 1069mcg given IM, and pt tolerated injection well.  Next B12 injection scheduled for next month.

## 2021-01-08 ENCOUNTER — Other Ambulatory Visit: Payer: Self-pay | Admitting: Family

## 2021-01-08 DIAGNOSIS — I10 Essential (primary) hypertension: Secondary | ICD-10-CM

## 2021-01-08 NOTE — Telephone Encounter (Signed)
Losartan all strengths on back order. Pharmacy is requesting another medication.

## 2021-01-09 ENCOUNTER — Other Ambulatory Visit: Payer: Self-pay | Admitting: Family

## 2021-01-09 MED ORDER — VALSARTAN 80 MG PO TABS: 80.0000 mg | ORAL_TABLET | Freq: Every day | ORAL | 5 refills | Status: DC

## 2021-01-09 NOTE — Telephone Encounter (Signed)
I sent rx for valsartan instead. Please advise pt to begin valsartan and arrange nurse visit in 1-2 weeks for bp check and follow up bmet.

## 2021-01-14 ENCOUNTER — Encounter: Payer: Self-pay | Admitting: Gastroenterology

## 2021-01-14 NOTE — Telephone Encounter (Signed)
Per patient she still has one month of Losartan, she will continue medication and switch when she runs out. She will call for 2 weeks follow up nv for bp check once she changes to new medication.

## 2021-01-15 ENCOUNTER — Other Ambulatory Visit: Payer: Self-pay | Admitting: Family

## 2021-01-19 ENCOUNTER — Other Ambulatory Visit: Payer: Self-pay | Admitting: Gastroenterology

## 2021-01-20 NOTE — Progress Notes (Incomplete)
Chronic Care Management Pharmacy Note  01/20/2021 Name:  Nancy [name] MRN:  [id number] DOB:  October 08, 1941  Subjective: Nancy [name] is an 80 y.o. year old female who is a primary patient of Debbrah Alar, NP.  The CCM team was consulted for assistance with disease management and care coordination needs.    Engaged with patient by telephone for follow up visit in response to provider referral for pharmacy case management and/or care coordination services.   Consent to Services:  The patient was given the following information about Chronic Care Management services today, agreed to services, and gave verbal consent: 1. CCM service includes personalized support from designated clinical staff supervised by the primary care provider, including individualized plan of care and coordination with other care providers 2. 24/7 contact phone numbers for assistance for urgent and routine care needs. 3. Service will only be billed when office clinical staff spend 20 minutes or more in a month to coordinate care. 4. Only one practitioner may furnish and bill the service in a calendar month. 5.The patient may stop CCM services at any time (effective at the end of the month) by phone call to the office staff. 6. The patient will be responsible for cost sharing (co-pay) of up to 20% of the service fee (after annual deductible is met). Patient agreed to services and consent obtained.  Patient Care Team: Debbrah Alar, NP as PCP - General Howell Rucks, Karsten Ro, MD as Consulting Physician (Endocrinology) [place] Crane, MD as Referring Physician (Rheumatology) Bo Merino, MD as Consulting Physician (Rheumatology) Day, Melvenia Beam, [facility] (Inactive) as Pharmacist (Pharmacist)  Recent office visits: 12/10/20 Inda Castle) - Chronic diarrhea was referred to GI.  No medication changes noted at this visit.  Recent consult visits: 12/21/20 (Nandigam, gastro) - take benefiber with meals, add Colestid  rx  Hospital visits: {Hospital DC Yes/No:25215}  Objective:  Lab Results  Component Value Date   CREATININE 0.89 12/25/2020   BUN 12 12/25/2020   GFR 61.51 12/25/2020   GFRNONAA 60 (L) 06/06/2019   GFRAA >60 06/06/2019   NA 140 12/25/2020   K 4.1 12/25/2020   CALCIUM 8.6 12/25/2020   CO2 28 12/25/2020    Lab Results  Component Value Date/Time   HGBA1C 5.8 12/10/2020 10:38 AM   HGBA1C 6.0 02/06/2020 11:37 AM   GFR 61.51 12/25/2020 04:34 PM   GFR 84.93 04/10/2020 11:09 AM    Last diabetic Eye exam: No results found for: HMDIABEYEEXA  Last diabetic Foot exam: No results found for: HMDIABFOOTEX   Lab Results  Component Value Date   CHOL 181 12/10/2020   HDL 40.90 12/10/2020   LDLCALC 122 (H) 12/10/2020   TRIG 92.0 12/10/2020   CHOLHDL 4 12/10/2020    Hepatic Function Latest Ref Rng & Units 12/25/2020 04/10/2020 03/29/2020  Total Protein 6.0 - 8.3 g/dL 7.4 6.6 6.3  Albumin 3.5 - 5.2 g/dL 3.8 3.7 3.5  AST 0 - 37 U/L 13 20 45(H)  ALT 0 - 35 U/L 15 25 49(H)  Alk Phosphatase 39 - 117 U/L 50 75 115  Total Bilirubin 0.2 - 1.2 mg/dL 0.5 0.8 0.8  Bilirubin, Direct 0.0 - 0.3 mg/dL - - 0.2    Lab Results  Component Value Date/Time   TSH 0.78 10/31/2020 09:29 AM   TSH 1.10 02/06/2020 11:37 AM   FREET4 1.17 08/14/2014 09:49 AM   FREET4 1.09 07/25/2014 02:10 PM    CBC Latest Ref Rng & Units 12/25/2020 03/25/2020 08/29/2019  WBC 4.0 - 10.5  K/uL 8.9 6.9 -  Hemoglobin 12.0 - 15.0 g/dL 11.4(L) 11.2(L) 11.9(L)  Hematocrit 36.0 - 46.0 % 34.9(L) 34.6(L) 35.0(L)  Platelets 150.0 - 400.0 K/uL 283.0 234.0 -    No results found for: VD25OH  Clinical ASCVD: {YES/NO:21197} The ASCVD Risk score Mikey Bussing DC Jr., et al., 2013) failed to calculate for the following reasons:   The patient has a prior MI or stroke diagnosis    Depression screen [facility] 2/9 10/24/2020 10/17/2020 08/09/2020  Decreased Interest 0 0 1  Down, Depressed, Hopeless 0 0 0  PHQ - 2 Score 0 0 1  Altered sleeping 1 - -   Tired, decreased energy 0 - -  Change in appetite 0 - -  Feeling bad or failure about yourself  0 - -  Trouble concentrating 1 - -  Moving slowly or fidgety/restless 0 - -  Suicidal thoughts 0 - -  PHQ-9 Score 2 - -  Some recent data might be hidden     ***Other: (CHADS2VASc if Afib, MMRC or CAT for COPD, ACT, DEXA)  Social History   Tobacco Use  Smoking Status Current Some Day Smoker  . Packs/day: 0.50  . Years: 32.00  . Pack years: 16.00  . Types: Cigarettes  Smokeless Tobacco Never Used  Tobacco Comment   6 or 7 per day   BP Readings from Last 3 Encounters:  12/25/20 (!) 142/62  12/10/20 (!) 126/52  11/19/20 (!) 147/64   Pulse Readings from Last 3 Encounters:  12/25/20 68  12/10/20 60  11/19/20 70   Wt Readings from Last 3 Encounters:  12/25/20 114 lb (51.7 kg)  12/10/20 114 lb 9.6 oz (52 kg)  11/19/20 106 lb 12.8 oz (48.4 kg)    Assessment/Interventions: Review of patient past medical history, allergies, medications, health status, including review of consultants reports, laboratory and other test data, was performed as part of comprehensive evaluation and provision of chronic care management services.   SDOH:  (Social Determinants of Health) assessments and interventions performed: {yes/no:20286}   CCM Care Plan  Allergies  Allergen Reactions  . Iron Nausea And Vomiting    Can take infusions   . Norvasc [Amlodipine] Other (See Comments)    dizziness    Medications Reviewed Today    Reviewed by Mauri Pole, MD (Physician) on 01/14/21 at 1008  Med List Status: <None>  Medication Order Taking? Sig Documenting Provider Last Dose Status Informant  Adalimumab 40 MG/0.4ML PNKT [id number] Yes Inject 40 mg into the skin once a week. Saturday [provider] Taking Active Self  carvedilol (COREG) 25 MG tablet [id number] Yes TAKE 1 TABLET BY MOUTH TWICE A DAY Debbrah Alar, NP Taking Active   cloNIDine (CATAPRES) 0.1 MG tablet [id number]  Yes TAKE 1 TABLET BY MOUTH 3 TIMES DAILY. Debbrah Alar, NP Taking Active   clopidogrel (PLAVIX) 75 MG tablet [id number] Yes TAKE 1 TABLET BY MOUTH EVERY DAY Debbrah Alar, NP Taking Active   colestipol (COLESTID) 1 g tablet [id number] Yes Take 1 tablet (1 g total) by mouth daily. Mauri Pole, MD  Active   cyanocobalamin ((VITAMIN B-12)) injection 1,000 mcg [id number]   Debbrah Alar, NP  Active   cyanocobalamin ((VITAMIN B-12)) injection 1,000 mcg [id number]   Debbrah Alar, NP  Active   Docusate Calcium (STOOL SOFTENER PO) [id number] Yes Take 1 tablet by mouth daily as needed (constipation).  [provider] Taking Active Self  escitalopram (LEXAPRO) 5 MG tablet [id number] Yes Take 1 tablet (5 mg total) by  mouth daily. Debbrah Alar, NP Taking Active   fluticasone [facility]) 50 MCG/ACT nasal spray [id number] Yes Place 1 spray into both nostrils daily as needed for allergies or rhinitis. [provider] Taking Active Self  gabapentin (NEURONTIN) 100 MG capsule [id number] Yes Take 1 capsule (100 mg total) by mouth 3 (three) times daily. Debbrah Alar, NP Taking Active            Med Note Lynita Lombard Oct 24, 2020 11:33 AM) Dewaine Conger as needed  hydrALAZINE (APRESOLINE) 25 MG tablet [id number] Yes Take 1 tablet (25 mg total) by mouth 3 (three) times daily. Debbrah Alar, NP Taking Active   leflunomide (ARAVA) 20 MG tablet [id number] Yes Take 1 tablet (20 mg total) by mouth daily. Debbrah Alar, NP Taking Active   losartan (COZAAR) 100 MG tablet [id number]  TAKE 1 TABLET BY MOUTH EVERY DAY Debbrah Alar, NP  Active   mycophenolate (CELLCEPT) 500 MG tablet [id number] Yes Take 500 mg by mouth daily. [provider] Taking Active Self  ondansetron (ZOFRAN) 4 MG tablet [id number] Yes Take 1 tablet (4 mg total) by mouth every 8 (eight) hours as needed for nausea or vomiting. Debbrah Alar, NP Taking Active   prednisoLONE  acetate (PRED FORTE) 1 % ophthalmic suspension [id number] Yes Place 1 drop into both eyes daily.  [provider] Taking Active Self  predniSONE (DELTASONE) 5 MG tablet [id number] Yes Take 5 mg by mouth daily as needed. [provider] Taking Active Self  PROLIA 60 MG/ML SOSY injection [id number] Yes Inject 60 mg as directed every 6 (six) months. [provider] Taking Active Self  rosuvastatin (CRESTOR) 40 MG tablet [id number] Yes Take 1 tablet (40 mg total) by mouth daily. Debbrah Alar, NP Taking Active   valsartan (DIOVAN) 80 MG tablet [id number]  Take 1 tablet (80 mg total) by mouth daily. Debbrah Alar, NP  Active           Patient Active Problem List   Diagnosis Date Noted  . Abnormal CT of the chest 09/12/2019  . Dyspnea 09/12/2019  . Tendinitis of right rotator cuff 02/24/2018  . History of CVA (cerebrovascular accident) 03/16/2017  . Hematuria 03/16/2017  . Acute ischemic right middle cerebral artery (MCA) stroke ([place]) 03/03/2017  . Subacromial bursitis of left shoulder joint 08/07/2016  . Drug therapy 01/07/2016  . Generalized anxiety disorder 01/07/2016  . Faintness 12/09/2015  . Loss of weight 10/15/2015  . Anemia, iron deficiency 09/30/2015  . Hyperglycemia 06/07/2015  . Near syncope 05/15/2015  . Osteoporosis 01/01/2015  . Expressive aphasia 12/16/2014  . TIA (transient ischemic attack) 12/16/2014  . Pain in joint, ankle and foot 09/06/2014  . Constipation 09/06/2014  . Rheumatoid arthritis ([place]) 07/25/2014  . Sciatica 01/31/2014  . Hypothyroidism 01/12/2012  . Back pain 01/11/2012  . Edema 06/15/2011  . ANEMIA, B12 DEFICIENCY 11/19/2010  . ANXIETY 10/14/2010  . 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 Hyperlipidemia 12/17/2009  . Iron deficiency anemia 12/17/2009  . TOBACCO ABUSE 12/17/2009  . Depression 12/17/2009  . Essential hypertension 12/17/2009  . ALLERGIC RHINITIS 12/17/2009  . ARTHRITIS, RHEUMATOID 12/17/2009  . OSTEOPOROSIS  12/17/2009  . URINARY INCONTINENCE 12/17/2009    Immunization History  Administered Date(s) Administered  . Fluad Quad(high Dose 65+) 08/01/2019, 08/06/2020  . Influenza Split 10/16/2011  . Influenza Whole 09/10/2009, 08/18/2010  . Influenza, High Dose Seasonal PF 09/10/2015, 08/31/2016, 07/19/2017, 08/03/2018  . Influenza,inj,Quad PF,6+ Mos 09/05/2014  . Influenza,inj,quad, With Preservative 07/19/2017  . Influenza-Unspecified 08/12/2012, 08/09/2013  .  PFIZER(Purple Top)SARS-COV-2 Vaccination 12/04/2019, 12/25/2019, 08/16/2020  . Pneumococcal Conjugate-13 09/05/2014  . Pneumococcal Polysaccharide-23 01/01/2009  . Td 12/26/2014    Conditions to be addressed/monitored:  Hypertension, Hyperlipidemia/Hx of Stroke, Pre-Diabetes, Hypothyroidism, Depression, Tobacco Use Disorder, Osteoporosis, Rheumatoid Arthritis/ Chorioretinal Inflammation of Both Eyes, Sciatica, Allergic Rhinitis   There are no care plans that you recently modified to display for this patient.    Medication Assistance: {MEDASSISTANCEINFO:25044}  Patient's preferred pharmacy is:  [id number] [place], [id number]: [phone]: [phone] Uses pill box? {Yes or If no, why not?:20788} Pt endorses ***% compliance  We discussed: {Pharmacy options:24294} Patient decided to: {US Pharmacy Plan:23885}  Care Plan and Follow Up Patient Decision:  {FOLLOWUP:24991}  Plan: {CM FOLLOW UP PLAN:25073}  ***  Current Barriers:  . {pharmacybarriers:24917} . ***  Pharmacist Clinical Goal(s):  . patient will {PHARMACYGOALCHOICES:24921} through collaboration with PharmD and provider.  . ***  Interventions: . 1:1 collaboration with ODebbrah Alar NP regarding development and update of comprehensive plan of care as evidenced by provider attestation and co-signature . Inter-disciplinary care team  collaboration (see longitudinal plan of care) . Comprehensive medication review performed; medication list updated in electronic medical record  Hypertension (BP goal {CHL HP UPSTREAM Pharmacist BP ranges:[phone]}) -{US controlled/uncontrolled:25276} -Current treatment: . *** -Medications previously tried: ***  -Current home readings: *** -Current dietary habits: *** -Current exercise habits: *** -{ACTIONS;DENIES/REPORTS:21021675::"Denies"} hypotensive/hypertensive symptoms -Educated on {CCM BP Counseling:25124} -Counseled to monitor BP at home ***, document, and provide log at future appointments -{CCMPHARMDINTERVENTION:25122}  Hyperlipidemia: (LDL goal < ***) -{US controlled/uncontrolled:25276} -Current treatment: . *** -Medications previously tried: ***  -Current dietary patterns: *** -Current exercise habits: *** -Educated on {CCM HLD Counseling:25126} -{CCMPHARMDINTERVENTION:25122}  Diabetes (A1c goal {A1c goals:23924}) -{US controlled/uncontrolled:25276} -Current medications: . *** -Medications previously tried: ***  -Current home glucose readings . fasting glucose: *** . post prandial glucose: *** -{ACTIONS;DENIES/REPORTS:21021675::"Denies"} hypoglycemic/hyperglycemic symptoms -Current meal patterns:  . breakfast: ***  . lunch: ***  . dinner: *** . snacks: *** . drinks: *** -Current exercise: *** -Educated on {CCM DM COUNSELING:25123} -Counseled to check feet daily and get yearly eye exams -{CCMPHARMDINTERVENTION:25122}  Tobacco use (Goal ***) -{US controlled/uncontrolled:25276} -Previous quit attempts: *** -Current treatment  . *** -Patient smokes {Time to first cigarette:23873} -Patient triggers include: {Smoking Triggers:23882} -On a scale of 1-10, reports MOTIVATION to quit is *** -On a scale of 1-10, reports CONFIDENCE in quitting is *** -{Smoking Cessation Counseling:23883} -{CCMPHARMDINTERVENTION:25122}  Osteoporosis / Osteopenia (Goal  ***) -{US controlled/uncontrolled:25276} -Last DEXA Scan: ***   T-Score femoral neck: ***  T-Score total hip: ***  T-Score lumbar spine: ***  T-Score forearm radius: ***  10-year probability of major osteoporotic fracture: ***  10-year probability of hip fracture: *** -Patient {is;is not an osteoporosis candidate:23886} -Current treatment  . *** -Medications previously tried: ***  -{Osteoporosis Counseling:23892} -{CCMPHARMDINTERVENTION:25122}  Hypothyroidism (Goal: ***) -{US controlled/uncontrolled:25276} -Current treatment   Levothyroxine 1043m daily -Medications previously tried: ***  -{CCMPHARMDINTERVENTION:25122}   Patient Goals/Self-Care Activities . patient will:  - {pharmacypatientgoals:24919}  Follow Up Plan: {CM FOLLOW UP PLIHWT:88828}

## 2021-01-24 ENCOUNTER — Telehealth: Payer: Self-pay | Admitting: Family

## 2021-01-24 ENCOUNTER — Ambulatory Visit (INDEPENDENT_AMBULATORY_CARE_PROVIDER_SITE_OTHER): Payer: Medicare Other | Admitting: Pharmacist

## 2021-01-24 DIAGNOSIS — F17200 Nicotine dependence, unspecified, uncomplicated: Secondary | ICD-10-CM

## 2021-01-24 DIAGNOSIS — E785 Hyperlipidemia, unspecified: Secondary | ICD-10-CM | POA: Diagnosis not present

## 2021-01-24 DIAGNOSIS — I10 Essential (primary) hypertension: Secondary | ICD-10-CM

## 2021-01-24 DIAGNOSIS — Z8673 Personal history of transient ischemic attack (TIA), and cerebral infarction without residual deficits: Secondary | ICD-10-CM

## 2021-01-24 DIAGNOSIS — M810 Age-related osteoporosis without current pathological fracture: Secondary | ICD-10-CM | POA: Diagnosis not present

## 2021-01-24 DIAGNOSIS — F411 Generalized anxiety disorder: Secondary | ICD-10-CM

## 2021-01-24 DIAGNOSIS — R739 Hyperglycemia, unspecified: Secondary | ICD-10-CM

## 2021-01-24 MED ORDER — CLONIDINE HCL 0.1 MG PO TABS: 0.1000 mg | ORAL_TABLET | Freq: Three times a day (TID) | ORAL | 1 refills | Status: DC

## 2021-01-24 NOTE — Telephone Encounter (Signed)
Done, thanks

## 2021-01-24 NOTE — Patient Instructions (Signed)
Visit Information  PATIENT GOALS:  Goals Addressed            This Visit's Progress   . Chronic Care Management Pharmacy Care Plan   On track    CARE PLAN ENTRY (see longitudinal plan of care for additional care plan information)  Current Barriers:  . 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 Pharmacist Clinical Goal(s): o Over the next 90 days, patient will work with PharmD and providers to achieve BP goal <130/80 . Current regimen:  . Carvedilol 36m twice daily . Clonidine 0.136mthree times daily  . Hydralazine 2555mhree times daily . Losartan 100m96mily . Interventions: o Requested patient check her blood pressure 2-3 times per week and record o Discussed BP goal o Discussed proper technique for BP checks at home and reminded her to make sure she is relaxed prior to checking BP. Patient to get new BP cuff o Called [facility] to check on availability of losartan 100mg40mey still currently are unable to get this medication. Patient will need either losartan 100mg 66mlternative valsartan 80mg i1mril. Patient is aware that she might need to make change and will notify office if she notices increase in BP.  o Will send message to PCP to consider updated Rx for clonidine 0.1mg qua77mty from #60 to #90 or #270 so patient can get 30 - 90 DS at a time . Patient self care activities - Over the next 90 days, patient will: o Check BP 2-3 times per week, document, and provide at future appointments o Ensure daily salt intake < 2300 mg/day  Hyperlipidemia/Hx of Stroke Lab Results  Component Value Date/Time   LDLCALC  122 (H) 12/10/2020 10:38 AM   LDLCALC 85 07/20/2018 02:49 PM   . Pharmacist Clinical Goal(s): o Over the next 90 days, patient will work with PharmD and providers to achieve LDL goal < 70 . Current regimen:  . Rosuvastatin 40mg dai74m Clopidogrel 75mg dail26mInterventions: o Discussed the similarities and differences between rosuvastatin and previous statin atorvastatin and why there is a strength difference o Smoking cessation discussed as well as benefits for stroke prevention  . Patient self care activities - Over the next 90 days, patient will: o Take rosuvastatin daily  Pre-Diabetes Lab Results  Component Value Date/Time   HGBA1C 5.8 12/10/2020 10:38 AM   HGBA1C 6.0 02/06/2020 11:37 AM   . Pharmacist Clinical Goal(s): o Over the next 90 days, patient will work with PharmD and providers to maintain A1c goal <6.5% . Current regimen:  o Diet and exercise management   . Interventions: o Discussed increasing exercise  . Patient self care activities - Over the next 90 days, patient will: o Patient to identify alternatives to water aerobics (classes suspended due ot COVID 19  Tobacco Use Disorder . Pharmacist Clinical Goal(s) o Over the next 90 days, patient will work with PharmD and providers to taper number of cigarettes smoked per day until no longer smoking. Quit date of 02/07/21 set.  . Current regimen:  o None . Interventions: o Discussed smoking cessation and set quit date of 02/07/21 o Will provide patient number for [place] QUIT line.  o Patient has  purchased Step 2 (57m/24hr) Nicotine Patch and nicotin lozenges to use as needed for breakthrough cravings . Patient self care activities - Over the next 90 days, patient will: o Start to decrease number of cigarettes smoked by 1 cigarette every 2 days. Quit date of 02/07/21. o Removed lighters and ash trays from sight by quit date.  o Think of alternative activities to do when experiences nicotine craving (take a walk, brush  teeth, word search)  Osteoporosis  . Pharmacist Clinical Goal(s) o Over the next 90 days, patient will work with PharmD and providers to reduce risk of fracture due to osteoporosis . Current regimen:  o Prolia 614mevery 6 months (last injection 11/12/2020) . Interventions: o none . Patient self care activities - Over the next 90 days, patient will: o Increase weight bearing exercise  Anxiety / Depression . Pharmacist Clinical Goal(s): o Over the next 90 days, patient will work with PharmD and providers to maintain control of anxiety and depression . Current regimen:  . None - previously was taking escitalopram (Lexapro) 6m66maily but patient stopped several months ago . Interventions: o Discussed reasons to take escitalopram and benefits of regular use.  o Will make provider aware that patient has decided to stop escitalopram since she feels her anxiety have improved . Patient self care activities - Over the next 90 days, patient will: o Monitor her mood and anxiety level. If any worsening of condition is noted, she will contact primary care provider's office.   Medication management . Pharmacist Clinical Goal(s): o Over the next 90 days, patient will work with PharmD and providers to achieve optimal medication adherence . [facility] . Interventions o Comprehensive medication review performed. o Continue current medication management strategy . Patient self care activities - Over the next 90 days, patient will: o Focus on medication adherence by filling and taking medications appropriately  o Take medications as prescribed o Report any questions or concerns to PharmD and/or provider(s)  Initial goal documentation        Consent to CCM Services: Ms. LloStoughs given information about Chronic Care Management services today including:  1. CCM service includes personalized support from designated clinical staff supervised by her physician, including individualized plan  of care and coordination with other care providers 2. 24/7 contact phone numbers for assistance for urgent and routine care needs. 3. Service will only be billed when office clinical staff spend 20 minutes or more in a month to coordinate care. 4. Only one practitioner may furnish and bill the service in a calendar month. 5. The patient may stop CCM services at any time (effective at the end of the month) by phone call to the office staff. 6. The patient will be responsible for cost sharing (co-pay) of up to 20% of the service fee (after annual deductible is met).  Patient agreed to services and verbal consent obtained.   The patient verbalized understanding of instructions, educational materials, and care plan provided today and agreed to receive a mailed copy of patient instructions, educational materials, and care plan.   Telephone follow up appointment with care management team member scheduled for: 4 weeks  TamCherre RobinsharmD Clinical Pharmacist [phone] Managing the Challenge of Quitting Smoking Quitting smoking is a physical and mental challenge. You will face cravings, withdrawal symptoms, and temptation. Before quitting, work with your health care provider to make a plan that can help you manage quitting. Preparation  can help you quit and keep you from giving in. How to manage lifestyle changes Managing stress Stress can make you want to smoke, and wanting to smoke may cause stress. It is important to find ways to manage your stress. You might try some of the following:  Practice relaxation techniques. ? Breathe slowly and deeply, in through your nose and out through your mouth. ? Listen to music. ? Soak in a bath or take a shower. ? Imagine a peaceful place or vacation.  Get some support. ? Talk with family or friends about your stress. ? Join a support group. ? Talk with a counselor or therapist.  Get some physical  activity. ? Go for a walk, run, or bike ride. ? Play a favorite sport. ? Practice yoga.   Medicines Talk with your health care provider about medicines that might help you deal with cravings and make quitting easier for you. Relationships Social situations can be difficult when you are quitting smoking. To manage this, you can:  Avoid parties and other social situations where people might be smoking.  Avoid alcohol.  Leave right away if you have the urge to smoke.  Explain to your family and friends that you are quitting smoking. Ask for support and let them know you might be a bit grumpy.  Plan activities where smoking is not an option. General instructions Be aware that many people gain weight after they quit smoking. However, not everyone does. To keep from gaining weight, have a plan in place before you quit and stick to the plan after you quit. Your plan should include:  Having healthy snacks. When you have a craving, it may help to: ? Eat popcorn, carrots, celery, or other cut vegetables. ? Chew sugar-free gum.  Changing how you eat. ? Eat small portion sizes at meals. ? Eat 4-6 small meals throughout the day instead of 1-2 large meals a day. ? Be mindful when you eat. Do not watch television or do other things that might distract you as you eat.  Exercising regularly. ? Make time to exercise each day. If you do not have time for a long workout, do short bouts of exercise for 5-10 minutes several times a day. ? Do some form of strengthening exercise, such as weight lifting. ? Do some exercise that gets your heart beating and causes you to breathe deeply, such as walking fast, running, swimming, or biking. This is very important.  Drinking plenty of water or other low-calorie or no-calorie drinks. Drink 6-8 glasses of water daily.   How to recognize withdrawal symptoms Your body and mind may experience discomfort as you try to get used to not having nicotine in your system.  These effects are called withdrawal symptoms. They may include:  Feeling hungrier than normal.  Having trouble concentrating.  Feeling irritable or restless.  Having trouble sleeping.  Feeling depressed.  Craving a cigarette. To manage withdrawal symptoms:  Avoid places, people, and activities that trigger your cravings.  Remember why you want to quit.  Get plenty of sleep.  Avoid coffee and other caffeinated drinks. These may worsen some of your symptoms. These symptoms may surprise you. But be assured that they are normal to have when quitting smoking. How to manage cravings Come up with a plan for how to deal with your cravings. The plan should include the following:  A definition of the specific situation you want to deal with.  An alternative action you will take.  A clear  idea for how this action will help.  The name of someone who might help you with this. Cravings usually last for 5-10 minutes. Consider taking the following actions to help you with your plan to deal with cravings:  Keep your mouth busy. ? Chew sugar-free gum. ? Suck on hard candies or a straw. ? Brush your teeth.  Keep your hands and body busy. ? Change to a different activity right away. ? Squeeze or play with a ball. ? Do an activity or a hobby, such as making bead jewelry, practicing needlepoint, or working with wood. ? Mix up your normal routine. ? Take a short exercise break. Go for a quick walk or run up and down stairs.  Focus on doing something kind or helpful for someone else.  Call a friend or family member to talk during a craving.  Join a support group.  Contact a quitline. Where to find support To get help or find a support group:  Call the [facility]'s Smoking Quitline: 1-800-QUIT NOW 7737834512)  Visit the website of the Substance Abuse and [place]: ktimeonline.com  Text QUIT to SmokefreeTXT: 893810 Where to find more  information Visit these websites to find more information on quitting smoking:  [place]: www.smokefree.gov  American Lung Association: www.lung.org  American Cancer Society: www.cancer.org  Centers for Disease Control and Prevention: http://www.wolf.info/  American Heart Association: www.heart.org Contact a health care provider if:  You want to change your plan for quitting.  The medicines you are taking are not helping.  Your eating feels out of control or you cannot sleep. Get help right away if:  You feel depressed or become very anxious. Summary  Quitting smoking is a physical and mental challenge. You will face cravings, withdrawal symptoms, and temptation to smoke again. Preparation can help you as you go through these challenges.  Try different techniques to manage stress, handle social situations, and prevent weight gain.  You can deal with cravings by keeping your mouth busy (such as by chewing gum), keeping your hands and body busy, calling family or friends, or contacting a quitline for people who want to quit smoking.  You can deal with withdrawal symptoms by avoiding places where people smoke, getting plenty of rest, and avoiding drinks with caffeine. This information is not intended to replace advice given to you by your health care provider. Make sure you discuss any questions you have with your health care provider. Document Revised: 08/15/2019 Document Reviewed: 08/15/2019 Elsevier Patient Education  [place].   Call [place] free tobacco cessation help 24/7 in several ways:  1-800-QUIT-NOW 336-284-9447); Espaol: 1-855-Djelo-Ya (7-824-235-3614) o para ms informacin haga clic aqu; Interpretation services available for many languages; Text READY to 200-400 to register via text Register online (en espaol) TTY: 781-835-9726 [place] Quitline: Call 888-7AI-QUIT (856) 058-8189)   How to Take Your Blood Pressure Blood  pressure measures how strongly your blood is pressing against the walls of your arteries. Arteries are blood vessels that carry blood from your heart throughout your body. You can take your blood pressure at home with a machine. You may need to check your blood pressure at home:  To check if you have high blood pressure (hypertension).  To check your blood pressure over time.  To make sure your blood pressure medicine is working. Supplies needed:  Blood pressure machine, or monitor.  Dining room chair to sit in.  Table or desk.  Small notebook.  Pencil or pen.  How to prepare Avoid these things for 30 minutes before checking your blood pressure:  Having drinks with caffeine in them, such as coffee or tea.  Drinking alcohol.  Eating.  Smoking.  Exercising. Do these things five minutes before checking your blood pressure:  Go to the bathroom and pee (urinate).  Sit in a dining chair. Do not sit in a soft couch or an armchair.  Be quiet. Do not talk. How to take your blood pressure Follow the instructions that came with your machine. If you have a digital blood pressure monitor, these may be the instructions: 1. Sit up straight. 2. Place your feet on the floor. Do not cross your ankles or legs. 3. Rest your left arm at the level of your heart. You may rest it on a table, desk, or chair. 4. Pull up your shirt sleeve. 5. Wrap the blood pressure cuff around the upper part of your left arm. The cuff should be 1 inch (2.5 cm) above your elbow. It is best to wrap the cuff around bare skin. 6. Fit the cuff snugly around your arm. You should be able to place only one finger between the cuff and your arm. 7. Place the cord so that it rests in the bend of your elbow. 8. Press the power button. 9. Sit quietly while the cuff fills with air and loses air. 10. Write down the numbers on the screen. 11. Wait 2-3 minutes and then repeat steps 1-10.   What do the numbers mean? Two  numbers make up your blood pressure. The first number is called systolic pressure. The second is called diastolic pressure. An example of a blood pressure reading is "120 over 80" (or 120/80). If you are an adult and do not have a medical condition, use this guide to find out if your blood pressure is normal: Normal  First number: below 120.  Second number: below 80. Elevated  First number: 120-129.  Second number: below 80. Hypertension stage 1  First number: 130-139.  Second number: 80-89. Hypertension stage 2  First number: 140 or above.  Second number: 85 or above. Your blood pressure is above normal even if only the top or bottom number is above normal. Follow these instructions at home:  Check your blood pressure as often as your doctor tells you to.  Check your blood pressure at the same time every day.  Take your monitor to your next doctor's appointment. Your doctor will: ? Make sure you are using it correctly. ? Make sure it is working right.  Make sure you understand what your blood pressure numbers should be.  Tell your doctor if your medicine is causing side effects.  Keep all follow-up visits as told by your doctor. This is important. General tips:  You will need a blood pressure machine, or monitor. Your doctor can suggest a monitor. You can buy one at a drugstore or online. When choosing one: ? Choose one with an arm cuff. ? Choose one that wraps around your upper arm. Only one finger should fit between your arm and the cuff. ? Do not choose one that measures your blood pressure from your wrist or finger. Where to find more information American Heart Association: www.heart.org Contact a doctor if:  Your blood pressure keeps being high. Get help right away if:  Your first blood pressure number is higher than 180.  Your second blood pressure number is higher than 120. Summary  Check your blood pressure at the same  time every day.  Avoid  caffeine, alcohol, smoking, and exercise for 30 minutes before checking your blood pressure.  Make sure you understand what your blood pressure numbers should be. This information is not intended to replace advice given to you by your health care provider. Make sure you discuss any questions you have with your health care provider. Document Revised: 10/20/2019 Document Reviewed: 10/20/2019 Elsevier Patient Education  2021 [employer].

## 2021-01-24 NOTE — Chronic Care Management (AMB) (Signed)
Chronic Care Management Pharmacy Note  01/24/2021 Name:  Nancy [name] MRN:  [id number] DOB:  [birth year]  Subjective: Nancy [name] is an 80 y.o. year old female who is a primary patient of Debbrah Alar, NP.  The CCM team was consulted for assistance with disease management and care coordination needs.   Patient lives alone and performs ADLs herself. She does have good social support from family though she does mention that one of her daughters had a stroke recently and she is concerned for her daughter.  Patient reports that she has stopped escitalopram 24m. States that she started due to anxiety surrounding COVID but feels that she no longer needs medication for anxiety.   Engaged with patient by telephone for follow up visit in response to provider referral for pharmacy case management and/or care coordination services.   Consent to Services:  The patient was given information about Chronic Care Management services, agreed to services, and gave verbal consent prior to initiation of services.  Please see initial visit note for detailed documentation.   Patient Care Team: ODebbrah Alar NP as PCP - General PHowell Rucks RKarsten Ro MD as Consulting Physician (Endocrinology) GNevada Crane MD as Referring Physician (Rheumatology) DBo Merino MD as Consulting Physician (Rheumatology) ECherre Robins PharmD (Pharmacist)  Recent office visits: 12/10/20 PCP (Inda Castle For Diarrhea. Referral to GI. No medication changes.  Consults: 12/25/20 Gastro (Dr. NSilverio Decamp. Seen for intermittent diarrhea and excessive gas.  X-rays taken. Labs drawn. Per note: doctor advised a lactose free diet and to take Benefiber 1 tablespoon three times a day with meals STARTED Colestipol 1 g daily.   11/19/20 Neurology SGarvin Fila MD. Per note: doctor recommend a repeat EEG. No medication changes  Hospital visits: None in previous 6 months  Objective:  Lab Results  Component Value Date    CREATININE 0.89 12/25/2020   CREATININE 0.85 09/09/2020   CREATININE 0.79 04/10/2020    Lab Results  Component Value Date   HGBA1C 5.8 12/10/2020       Component Value Date/Time   CHOL 181 12/10/2020 1038   CHOL 150 07/20/2018 1449   TRIG 92.0 12/10/2020 1038   HDL 40.90 12/10/2020 1038   HDL 44 07/20/2018 1449   CHOLHDL 4 12/10/2020 1038   VLDL 18.4 12/10/2020 1038   LDLCALC 122 (H) 12/10/2020 1038   LDLCALC 85 07/20/2018 1449    Hepatic Function Latest Ref Rng & Units 12/25/2020 04/10/2020 03/29/2020  Total Protein 6.0 - 8.3 g/dL 7.4 6.6 6.3  Albumin 3.5 - 5.2 g/dL 3.8 3.7 3.5  AST 0 - 37 U/L 13 20 45(H)  ALT 0 - 35 U/L 15 25 49(H)  Alk Phosphatase 39 - 117 U/L 50 75 115  Total Bilirubin 0.2 - 1.2 mg/dL 0.5 0.8 0.8  Bilirubin, Direct 0.0 - 0.3 mg/dL - - 0.2    Lab Results  Component Value Date/Time   TSH 0.78 10/31/2020 09:29 AM   TSH 1.10 02/06/2020 11:37 AM   FREET4 1.17 08/14/2014 09:49 AM   FREET4 1.09 07/25/2014 02:10 PM    CBC Latest Ref Rng & Units 12/25/2020 03/25/2020 08/29/2019  WBC 4.0 - 10.5 K/uL 8.9 6.9 -  Hemoglobin 12.0 - 15.0 g/dL 11.4(L) 11.2(L) 11.9(L)  Hematocrit 36.0 - 46.0 % 34.9(L) 34.6(L) 35.0(L)  Platelets 150.0 - 400.0 K/uL 283.0 234.0 -     Ref. Range 12/29/2018 10:35 08/01/2019 10:21  Vitamin D 1, 25 (OH) Total Latest Ref Range: 18 - 72 pg/mL 108 (H) 60  Clinical ASCVD: Yes  The ASCVD Risk score Mikey Bussing DC Jr., et al., 2013) failed to calculate for the following reasons:   The patient has a prior MI or stroke diagnosis    Depression screen [facility] 2/9 01/24/2021 10/24/2020 10/17/2020 08/09/2020 07/25/2019  Decreased Interest 1 0 0 1 0  Down, Depressed, Hopeless 0 0 0 0 1  PHQ - 2 Score 1 0 0 1 1  Altered sleeping - 1 - - -  Tired, decreased energy - 0 - - -  Change in appetite - 0 - - -  Feeling bad or failure about yourself  - 0 - - -  Trouble concentrating - 1 - - -  Moving slowly or fidgety/restless - 0 - - -  Suicidal thoughts - 0 - - -   PHQ-9 Score - 2 - - -  Some recent data might be hidden    Social History   Tobacco Use  Smoking Status Current Some Day Smoker  . Packs/day: 0.50  . Years: 32.00  . Pack years: 16.00  . Types: Cigarettes  Smokeless Tobacco Never Used  Tobacco Comment   6 or 7 per day   BP Readings from Last 3 Encounters:  12/25/20 (!) 142/62  12/10/20 (!) 126/52  11/19/20 (!) 147/64   Pulse Readings from Last 3 Encounters:  12/25/20 68  12/10/20 60  11/19/20 70   Wt Readings from Last 3 Encounters:  12/25/20 114 lb (51.7 kg)  12/10/20 114 lb 9.6 oz (52 kg)  11/19/20 106 lb 12.8 oz (48.4 kg)    Assessment: Review of patient past medical history, allergies, medications, health status, including review of consultants reports, laboratory and other test data, was performed as part of comprehensive evaluation and provision of chronic care management services.   SDOH:  (Social Determinants of Health) assessments and interventions performed:    CCM Care Plan  Allergies  Allergen Reactions  . Iron Nausea And Vomiting    Can take infusions   . Norvasc [Amlodipine] Other (See Comments)    dizziness    Medications Reviewed Today    Reviewed by Cherre Robins, PharmD (Pharmacist) on 01/24/21 at 60  Med List Status: <None>  Medication Order Taking? Sig Documenting Provider Last Dose Status Informant  acetaminophen (TYLENOL) 500 MG tablet [id number] Yes Take 500 mg by mouth every 6 (six) hours as needed for moderate pain. [provider] Taking Active Self  Adalimumab 40 MG/0.4ML PNKT [id number] Yes Inject 40 mg into the skin once a week. Saturday [provider] Taking Active Self  carvedilol (COREG) 25 MG tablet [id number] Yes Take 1 tablet (25 mg total) by mouth 2 (two) times daily. Debbrah Alar, NP Taking Active   cloNIDine (CATAPRES) 0.1 MG tablet [id number] Yes TAKE 1 TABLET BY MOUTH 3 TIMES DAILY. Debbrah Alar, NP Taking Active   clopidogrel (PLAVIX) 75  MG tablet [id number] Yes Take 1 tablet (75 mg total) by mouth daily. Debbrah Alar, NP Taking Active   colestipol (COLESTID) 1 g tablet [id number] Yes TAKE 1 TABLET BY MOUTH DAILY. Mauri Pole, MD Taking Active   cyanocobalamin ((VITAMIN B-12)) injection 1,000 mcg [id number]   Debbrah Alar, NP  Active   cyanocobalamin ((VITAMIN B-12)) injection 1,000 mcg [id number]   Debbrah Alar, NP  Active   Docusate Calcium (STOOL SOFTENER PO) [id number] No Take 1 tablet by mouth daily as needed (constipation).   Patient not taking: Reported on 01/24/2021   [provider] Not Taking Active Self  escitalopram (LEXAPRO)  5 MG tablet [id number] No Take 1 tablet (5 mg total) by mouth daily.  Patient not taking: Reported on 01/24/2021   Debbrah Alar, NP Not Taking Active   fluticasone [facility]) 50 MCG/ACT nasal spray [id number] Yes Place 1 spray into both nostrils daily as needed for allergies or rhinitis. [provider] Taking Active Self  gabapentin (NEURONTIN) 100 MG capsule [id number] No Take 1 capsule (100 mg total) by mouth 3 (three) times daily.  Patient not taking: Reported on 01/24/2021   Debbrah Alar, NP Not Taking Active            Med Note Lynita Lombard Oct 24, 2020 11:33 AM) Dewaine Conger as needed  hydrALAZINE (APRESOLINE) 25 MG tablet [id number] Yes Take 1 tablet (25 mg total) by mouth 3 (three) times daily. Debbrah Alar, NP Taking Active   leflunomide (ARAVA) 20 MG tablet [id number] Yes Take 1 tablet (20 mg total) by mouth daily. Debbrah Alar, NP Taking Active   losartan (COZAAR) 100 MG tablet [id number] Yes Take 100 mg by mouth daily. [provider] Taking Active   mycophenolate (CELLCEPT) 500 MG tablet [id number] Yes Take 500 mg by mouth daily. [provider] Taking Active Self  ondansetron (ZOFRAN) 4 MG tablet [id number] No Take 1 tablet (4 mg total) by mouth every 8 (eight) hours as needed for nausea or vomiting.   Patient not taking: Reported on 01/24/2021   Debbrah Alar, NP Not Taking Active   prednisoLONE acetate (PRED FORTE) 1 % ophthalmic suspension [id number] Yes Place 1 drop into both eyes daily.  [provider] Taking Active Self  predniSONE (DELTASONE) 5 MG tablet [id number] Yes Take 5 mg by mouth daily as needed. [provider] Taking Active Self  PROLIA 60 MG/ML SOSY injection [id number] Yes Inject 60 mg as directed every 6 (six) months. [provider] Taking Active Self  rosuvastatin (CRESTOR) 40 MG tablet [id number] Yes Take 1 tablet (40 mg total) by mouth daily. Debbrah Alar, NP Taking Active   valsartan (DIOVAN) 80 MG tablet [id number] No Take 1 tablet (80 mg total) by mouth daily.  Patient not taking: No sig reported   Debbrah Alar, NP Not Taking Active            Med Note Antony Contras, Maleeah Crossman B   Fri Jan 24, 2021 10:09 AM) Patient has not started. Losartan was changed to valsartan 3m due to [facility] and it was on back order. Patient has about 1 month of losartan so she has not filled valsartan yet and hopes that [facility] before she needs refill.           Patient Active Problem List   Diagnosis Date Noted  . Abnormal CT of the chest 09/12/2019  . Dyspnea 09/12/2019  . Tendinitis of right rotator cuff 02/24/2018  . History of CVA (cerebrovascular accident) 03/16/2017  . Hematuria 03/16/2017  . Acute ischemic right middle cerebral artery (MCA) stroke ([place] 03/03/2017  . Subacromial bursitis of left shoulder joint 08/07/2016  . Drug therapy 01/07/2016  . Generalized anxiety disorder 01/07/2016  . Faintness 12/09/2015  . Loss of weight 10/15/2015  . Anemia, iron deficiency 09/30/2015  . Hyperglycemia 06/07/2015  . Near syncope 05/15/2015  . Osteoporosis 01/01/2015  . Expressive aphasia 12/16/2014  . TIA (transient ischemic attack) 12/16/2014  . Pain in joint, ankle and foot 09/06/2014  . Constipation  09/06/2014  . Rheumatoid arthritis ([place] 07/25/2014  . Sciatica 01/31/2014  . Hypothyroidism  01/12/2012  . Back pain 01/11/2012  . Edema 06/15/2011  . ANEMIA, B12 DEFICIENCY 11/19/2010  . ANXIETY 10/14/2010  . 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 Hyperlipidemia 12/17/2009  . Iron deficiency anemia 12/17/2009  . TOBACCO ABUSE 12/17/2009  . Depression 12/17/2009  . Essential hypertension 12/17/2009  . ALLERGIC RHINITIS 12/17/2009  . ARTHRITIS, RHEUMATOID 12/17/2009  . OSTEOPOROSIS 12/17/2009  . URINARY INCONTINENCE 12/17/2009    Immunization History  Administered Date(s) Administered  . Fluad Quad(high Dose 65+) 08/01/2019, 08/06/2020  . Influenza Split 10/16/2011  . Influenza Whole 09/10/2009, 08/18/2010  . Influenza, High Dose Seasonal PF 09/10/2015, 08/31/2016, 07/19/2017, 08/03/2018  . Influenza,inj,Quad PF,6+ Mos 09/05/2014  . Influenza,inj,quad, With Preservative 07/19/2017  . Influenza-Unspecified 08/12/2012, 08/09/2013  . PFIZER(Purple Top)SARS-COV-2 Vaccination 12/04/2019, 12/25/2019, 08/16/2020  . Pneumococcal Conjugate-13 09/05/2014  . Pneumococcal Polysaccharide-23 01/01/2009  . Td 12/26/2014    Conditions to be addressed/monitored: HTN, HLD, Anxiety, Depression and rheumatoid arthritis, osteoporosis, tobacco abuse, back pain / sciatia, chronic constipation  CARE PLAN ENTRY (see longitudinal plan of care for additional care plan information)  Current Barriers:  . 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 Pharmacist Clinical Goal(s): o Over the next 90 days, patient will work with PharmD and  providers to achieve BP goal <130/80 . Current regimen:  . Carvedilol 31m twice daily . Clonidine 0.111mthree times daily  . Hydralazine 2524mhree times daily . Losartan 100m1mily . Interventions: o Requested patient check her blood pressure 2-3 times per week and record o Discussed BP goal o Discussed proper technique for BP checks at home and reminded her to make sure she is relaxed prior to checking BP. Patient to get new BP cuff o [facility] 100mg72mey still currently are unable to get this medication. Patient will need either losartan 100mg 61mlternative valsartan 80mg i25mril. Patient is aware that she might need to make change and will notify office if she notices increase in BP.  o Will send message to PCP to consider updated Rx for clonidine 0.1mg qua58mty from #60 to #90 or #270 so patient can get 30 - 90 DS at a time . Patient self care activities - Over the next 90 days, patient will: o Check BP 2-3 times per week, document, and provide at future appointments o Ensure daily salt intake < 2300 mg/day  Hyperlipidemia/Hx of Stroke Lab Results  Component Value Date/Time   LDLCALC 122 (H) 12/10/2020 10:38 AM   LDLCALC 85 07/20/2018 02:49 PM   . Pharmacist Clinical Goal(s): o Over the next 90 days, patient will work with PharmD and providers to achieve LDL goal < 70 . Current regimen:  . Rosuvastatin 40mg dai13m Clopidogrel 75mg dail31mInterventions: o Discussed the similarities and differences between rosuvastatin and previous statin atorvastatin and why there is a strength difference o Smoking cessation discussed as well as benefits for stroke prevention  . Patient self care activities - Over the next 90 days, patient will: o Take rosuvastatin daily  Pre-Diabetes Lab Results  Component Value Date/Time   HGBA1C 5.8 12/10/2020 10:38 AM   HGBA1C 6.0 02/06/2020 11:37 AM   . Pharmacist Clinical Goal(s): o  Over the next 90 days,  patient will work with PharmD and providers to maintain A1c goal <6.5% . Current regimen:  o Diet and exercise management   . Interventions: o Discussed increasing exercise  . Patient self care activities - Over the next 90 days, patient will: o Patient to identify alternatives to water aerobics (classes suspended due ot COVID 19  Tobacco Use Disorder . Pharmacist Clinical Goal(s) o Over the next 90 days, patient will work with PharmD and providers to taper number of cigarettes smoked per day until no longer smoking. Quit date of 02/07/21 set.  . Current regimen:  o None . Interventions: o Discussed smoking cessation and set quit date of 02/07/21 o Will provide patient number for [place] QUIT line.  o Patient has purchased Step 2 (76m/24hr) Nicotine Patch and nicotin lozenges to use as needed for breakthrough cravings . Patient self care activities - Over the next 90 days, patient will: o Start to decrease number of cigarettes smoked by 1 cigarette every 2 days. Quit date of 02/07/21. o Removed lighters and ash trays from sight by quit date.  o Think of alternative activities to do when experiences nicotine craving (take a walk, brush teeth, word search)  Osteoporosis  . Pharmacist Clinical Goal(s) o Over the next 90 days, patient will work with PharmD and providers to reduce risk of fracture due to osteoporosis . Current regimen:  o Prolia 669mevery 6 months (last injection 11/12/2020) . Interventions: o none . Patient self care activities - Over the next 90 days, patient will: o Increase weight bearing exercise   Medication management . Pharmacist Clinical Goal(s): o Over the next 90 days, patient will work with PharmD and providers to achieve optimal medication adherence . [facility] . Interventions o Comprehensive medication review performed. o Continue current medication management strategy . Patient self care activities - Over the next 90 days, patient will: o Focus on  medication adherence by filling and taking medications appropriately  o Take medications as prescribed o Report any questions or concerns to PharmD and/or provider(  Medication Assistance: None required.  Patient affirms current coverage meets needs.  [id number] [id number]: [phone]: [phone] pill box? Yes Pt endorses 100% compliance  Follow Up:  Patient agrees to Care Plan and Follow-up.  Plan: Telephone follow up appointment with care management team member scheduled for:  4 weeks to check on smoking cessation status  TamCherre RobinsharmD Clinical Pharmacist [phone]

## 2021-01-24 NOTE — Addendum Note (Signed)
Addended by: Debbrah Alar on: 01/24/2021 12:12 PM   Modules accepted: Orders

## 2021-01-24 NOTE — Telephone Encounter (Signed)
I had a CCM visit with Nancy [name] today and noticed that she has only been getting 20 days supply = 60 tabs of her clonidine 0.1mg  tid tabs. She would like and updated Rx sent in if you approve or either 30 days supply = #90 or 90 days supply = #270.   Also wanted to make you aware that she has stopped escitalopram 5mg  about 2-3 months ago. She reports that she had started due to anxiety surrounding COVID but feels that she is doing well now. She is aware to call office if anxiety increases.

## 2021-01-27 ENCOUNTER — Other Ambulatory Visit: Payer: Self-pay | Admitting: Family

## 2021-02-05 ENCOUNTER — Telehealth: Payer: Self-pay | Admitting: Pharmacist

## 2021-02-05 NOTE — Progress Notes (Addendum)
    Chronic Care Management Pharmacy Assistant   Name: Nancy [name]  MRN: [id number] DOB: 17-Jun-1941   Reason for Encounter: Adherence Review  Verified Adherence Gap Information. Per insurance data, the patient is 90-99% compliant with the CHOL and HTN medication. Per insurance data patient has not met their annual wellness and wellness bundle screening. Their most recent A1C was 6 on 02/06/20.The patients most recent blood pressure was 148/56 on 08/09/20. The patient met goal with keeping their blood pressure lower than 140/90. The patients total gaps-all measures is equal too 3.    Follow-Up:Pharmacist Review  Charlann Lange, [place] Pharmacist Assistant [phone]

## 2021-02-07 ENCOUNTER — Other Ambulatory Visit: Payer: Self-pay

## 2021-02-07 ENCOUNTER — Ambulatory Visit (INDEPENDENT_AMBULATORY_CARE_PROVIDER_SITE_OTHER): Payer: Medicare Other

## 2021-02-07 DIAGNOSIS — E538 Deficiency of other specified B group vitamins: Secondary | ICD-10-CM | POA: Diagnosis not present

## 2021-02-07 MED ORDER — CYANOCOBALAMIN 1000 MCG/ML IJ SOLN
1000.0000 ug | Freq: Once | INTRAMUSCULAR | Status: AC
  Administered 2021-02-07: 1000 ug via INTRAMUSCULAR

## 2021-02-07 NOTE — Progress Notes (Signed)
Pt here for monthly B12 injection per Earlie Counts  B12 1064mcg given IM on left Deltoid, and pt tolerated injection well.  Next B12 injection scheduled for 03/11/2021.

## 2021-02-13 ENCOUNTER — Other Ambulatory Visit: Payer: Self-pay | Admitting: Gastroenterology

## 2021-02-13 DIAGNOSIS — M0579 Rheumatoid arthritis with rheumatoid factor of multiple sites without organ or systems involvement: Secondary | ICD-10-CM | POA: Diagnosis not present

## 2021-02-13 DIAGNOSIS — H15042 Scleritis with corneal involvement, left eye: Secondary | ICD-10-CM | POA: Diagnosis not present

## 2021-02-13 DIAGNOSIS — H209 Unspecified iridocyclitis: Secondary | ICD-10-CM | POA: Diagnosis not present

## 2021-02-13 DIAGNOSIS — I10 Essential (primary) hypertension: Secondary | ICD-10-CM | POA: Diagnosis not present

## 2021-02-13 DIAGNOSIS — Z79899 Other long term (current) drug therapy: Secondary | ICD-10-CM | POA: Diagnosis not present

## 2021-02-13 DIAGNOSIS — M2550 Pain in unspecified joint: Secondary | ICD-10-CM | POA: Diagnosis not present

## 2021-02-13 DIAGNOSIS — Z681 Body mass index (BMI) 19 or less, adult: Secondary | ICD-10-CM | POA: Diagnosis not present

## 2021-02-13 DIAGNOSIS — R350 Frequency of micturition: Secondary | ICD-10-CM | POA: Diagnosis not present

## 2021-02-14 ENCOUNTER — Ambulatory Visit: Payer: Medicare Other | Admitting: Gastroenterology

## 2021-02-18 ENCOUNTER — Ambulatory Visit (INDEPENDENT_AMBULATORY_CARE_PROVIDER_SITE_OTHER): Payer: Medicare Other | Admitting: Pharmacist

## 2021-02-18 DIAGNOSIS — E785 Hyperlipidemia, unspecified: Secondary | ICD-10-CM

## 2021-02-18 DIAGNOSIS — I10 Essential (primary) hypertension: Secondary | ICD-10-CM

## 2021-02-18 DIAGNOSIS — F17200 Nicotine dependence, unspecified, uncomplicated: Secondary | ICD-10-CM

## 2021-02-18 NOTE — Chronic Care Management (AMB) (Signed)
Chronic Care Management Pharmacy Note  02/18/2021 Name:  Nancy [name] MRN:  [id number] DOB:  February 26, 1941  Subjective: Nancy [name] is an 80 y.o. year old female who is a primary patient of Debbrah Alar, NP.  The CCM team was consulted for assistance with disease management and care coordination needs.    Engaged with patient by telephone for follow up visit in response to provider referral for pharmacy case management and/or care coordination services.   Consent to Services:  The patient was given information about Chronic Care Management services, agreed to services, and gave verbal consent prior to initiation of services.  Please see initial visit note for detailed documentation.   Patient Care Team: Debbrah Alar, NP as PCP - General Howell Rucks, Karsten Ro, MD as Consulting Physician (Endocrinology) [place] Crane, MD as Referring Physician (Rheumatology) Bo Merino, MD as Consulting Physician (Rheumatology) Cherre Robins, PharmD (Pharmacist)  Recent office visits: 02/07/21 - nurse visit for B12 injection 01/07/21 - nurse visit for B12 injeciton 12/10/20 PCP Inda Castle) For Diarrhea. Referral to GI. No medication changes.  Consults: 12/25/20 Gastro (Dr. Silverio Decamp). Seen for intermittent diarrhea and excessive gas.  X-rays taken. Labs drawn. Per note: doctor advised a lactose free diet and to take Benefiber 1 tablespoon three times a day with meals STARTED Colestipol 1 g daily.   11/19/20 Neurology Garvin Fila, MD. Per note: doctor recommend a repeat EEG. No medication changes  Hospital visits: None in previous 6 months  Objective:  Lab Results  Component Value Date   CREATININE 0.89 12/25/2020   CREATININE 0.85 09/09/2020   CREATININE 0.79 04/10/2020    Lab Results  Component Value Date   HGBA1C 5.8 12/10/2020       Component Value Date/Time   CHOL 181 12/10/2020 1038   CHOL 150 07/20/2018 1449   TRIG 92.0 12/10/2020 1038   HDL 40.90 12/10/2020  1038   HDL 44 07/20/2018 1449   CHOLHDL 4 12/10/2020 1038   VLDL 18.4 12/10/2020 1038   LDLCALC 122 (H) 12/10/2020 1038   LDLCALC 85 07/20/2018 1449    Hepatic Function Latest Ref Rng & Units 12/25/2020 04/10/2020 03/29/2020  Total Protein 6.0 - 8.3 g/dL 7.4 6.6 6.3  Albumin 3.5 - 5.2 g/dL 3.8 3.7 3.5  AST 0 - 37 U/L 13 20 45(H)  ALT 0 - 35 U/L 15 25 49(H)  Alk Phosphatase 39 - 117 U/L 50 75 115  Total Bilirubin 0.2 - 1.2 mg/dL 0.5 0.8 0.8  Bilirubin, Direct 0.0 - 0.3 mg/dL - - 0.2    Lab Results  Component Value Date/Time   TSH 0.78 10/31/2020 09:29 AM   TSH 1.10 02/06/2020 11:37 AM   FREET4 1.17 08/14/2014 09:49 AM   FREET4 1.09 07/25/2014 02:10 PM    CBC Latest Ref Rng & Units 12/25/2020 03/25/2020 08/29/2019  WBC 4.0 - 10.5 K/uL 8.9 6.9 -  Hemoglobin 12.0 - 15.0 g/dL 11.4(L) 11.2(L) 11.9(L)  Hematocrit 36.0 - 46.0 % 34.9(L) 34.6(L) 35.0(L)  Platelets 150.0 - 400.0 K/uL 283.0 234.0 -     Ref. Range 12/29/2018 10:35 08/01/2019 10:21  Vitamin D 1, 25 (OH) Total Latest Ref Range: 18 - 72 pg/mL 108 (H) 60    Result Notes  Component Ref Range & Units 1 yr ago  (10/31/19) 5 yr ago  (09/10/15) 6 yr ago  (02/18/15) 10 yr ago  (11/14/10) 10 yr ago  (10/14/10)  Vitamin B-12 211 - 911 pg/mL 101Low  >1500High  72Low  188Low  [place]       Clinical ASCVD: Yes  The ASCVD Risk score Mikey Bussing DC Jr., et al., 2013) failed to calculate for the following reasons:   The patient has a prior MI or stroke diagnosis    Depression screen [facility] 2/9 01/24/2021 10/24/2020 10/17/2020 08/09/2020 07/25/2019  Decreased Interest 1 0 0 1 0  Down, Depressed, Hopeless 0 0 0 0 1  PHQ - 2 Score 1 0 0 1 1  Altered sleeping - 1 - - -  Tired, decreased energy - 0 - - -  Change in appetite - 0 - - -  Feeling bad or failure about yourself  - 0 - - -  Trouble concentrating - 1 - - -  Moving slowly or fidgety/restless - 0 - - -  Suicidal  thoughts - 0 - - -  PHQ-9 Score - 2 - - -  Some recent data might be hidden    Social History   Tobacco Use  Smoking Status Current Some Day Smoker  . Packs/day: 0.50  . Years: 32.00  . Pack years: 16.00  . Types: Cigarettes  Smokeless Tobacco Never Used  Tobacco Comment   6 or 7 per day   BP Readings from Last 3 Encounters:  12/25/20 (!) 142/62  12/10/20 (!) 126/52  11/19/20 (!) 147/64   Pulse Readings from Last 3 Encounters:  12/25/20 68  12/10/20 60  11/19/20 70   Wt Readings from Last 3 Encounters:  12/25/20 114 lb (51.7 kg)  12/10/20 114 lb 9.6 oz (52 kg)  11/19/20 106 lb 12.8 oz (48.4 kg)    Assessment: Review of patient past medical history, allergies, medications, health status, including review of consultants reports, laboratory and other test data, was performed as part of comprehensive evaluation and provision of chronic care management services.   SDOH:  (Social Determinants of Health) assessments and interventions performed:  SDOH Interventions   Flowsheet Row Most Recent Value  SDOH Interventions   Financial Strain Interventions Intervention Not Indicated      CCM Care Plan  Allergies  Allergen Reactions  . Iron Nausea And Vomiting    Can take infusions   . Norvasc [Amlodipine] Other (See Comments)    dizziness    Medications Reviewed Today    Reviewed by Cherre Robins, PharmD (Pharmacist) on 02/18/21 at [place] List Status: <None>  Medication Order Taking? Sig Documenting Provider Last Dose Status Informant  acetaminophen (TYLENOL) 500 MG tablet [id number] Yes Take 500 mg by mouth every 6 (six) hours as needed for moderate pain. [provider] Taking Active Self  Adalimumab 40 MG/0.4ML PNKT [id number] Yes Inject 40 mg into the skin once a week. Saturday [provider] Taking Active Self  carvedilol (COREG) 25 MG tablet [id number] Yes Take 1 tablet (25 mg total) by mouth 2 (two) times daily. Debbrah Alar, NP Taking  Active   cloNIDine (CATAPRES) 0.1 MG tablet [id number] Yes Take 1 tablet (0.1 mg total) by mouth 3 (three) times daily. Debbrah Alar, NP Taking Active   clopidogrel (PLAVIX) 75 MG tablet [id number] Yes Take 1 tablet (75 mg total) by mouth daily. Debbrah Alar, NP Taking Active   colestipol (COLESTID) 1 g tablet [id number] Yes TAKE 1 TABLET BY MOUTH EVERY DAY Nandigam, Venia Minks, MD Taking Active   cyanocobalamin ((VITAMIN B-12)) injection 1,000 mcg [id number]   Debbrah Alar, NP  Active   cyanocobalamin ((VITAMIN B-12)) injection 1,000  mcg [id number]   Debbrah Alar, NP  Active   escitalopram (LEXAPRO) 5 MG tablet [id number] Yes Take 1 tablet (5 mg total) by mouth daily. Debbrah Alar, NP Taking Active   fluticasone [facility]) 50 MCG/ACT nasal spray [id number] Yes Place 1 spray into both nostrils daily as needed for allergies or rhinitis. [provider] Taking Active Self  hydrALAZINE (APRESOLINE) 25 MG tablet [id number] Yes Take 1 tablet (25 mg total) by mouth 3 (three) times daily. Debbrah Alar, NP Taking Active   leflunomide (ARAVA) 20 MG tablet [id number] Yes Take 1 tablet (20 mg total) by mouth daily. Debbrah Alar, NP Taking Active   losartan (COZAAR) 100 MG tablet [id number] Yes Take 100 mg by mouth daily. [provider] Taking Active   mycophenolate (CELLCEPT) 500 MG tablet [id number] Yes Take 500 mg by mouth daily. [provider] Taking Active Self  prednisoLONE acetate (PRED FORTE) 1 % ophthalmic suspension [id number]  Place 1 drop into both eyes daily.  [provider]  Active Self  predniSONE (DELTASONE) 5 MG tablet [id number] Yes Take 5 mg by mouth daily as needed. [provider] Taking Active Self  PROLIA 60 MG/ML SOSY injection [id number] Yes Inject 60 mg as directed every 6 (six) months. [provider] Taking Active Self  rosuvastatin (CRESTOR) 40 MG tablet [id number] Yes Take 1 tablet (40 mg total)  by mouth daily. Debbrah Alar, NP Taking Active           Patient Active Problem List   Diagnosis Date Noted  . Abnormal CT of the chest 09/12/2019  . Dyspnea 09/12/2019  . Tendinitis of right rotator cuff 02/24/2018  . History of CVA (cerebrovascular accident) 03/16/2017  . Hematuria 03/16/2017  . Acute ischemic right middle cerebral artery (MCA) stroke ([place]) 03/03/2017  . Subacromial bursitis of left shoulder joint 08/07/2016  . Drug therapy 01/07/2016  . Generalized anxiety disorder 01/07/2016  . Faintness 12/09/2015  . Loss of weight 10/15/2015  . Anemia, iron deficiency 09/30/2015  . Hyperglycemia 06/07/2015  . Near syncope 05/15/2015  . Osteoporosis 01/01/2015  . Expressive aphasia 12/16/2014  . TIA (transient ischemic attack) 12/16/2014  . Pain in joint, ankle and foot 09/06/2014  . Constipation 09/06/2014  . Rheumatoid arthritis ([place]) 07/25/2014  . Sciatica 01/31/2014  . Hypothyroidism 01/12/2012  . Back pain 01/11/2012  . Edema 06/15/2011  . ANEMIA, B12 DEFICIENCY 11/19/2010  . Anxiety state 10/14/2010  . 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 Hyperlipidemia 12/17/2009  . Iron deficiency anemia 12/17/2009  . TOBACCO ABUSE 12/17/2009  . Depression 12/17/2009  . Essential hypertension 12/17/2009  . ALLERGIC RHINITIS 12/17/2009  . ARTHRITIS, RHEUMATOID 12/17/2009  . OSTEOPOROSIS 12/17/2009  . URINARY INCONTINENCE 12/17/2009    Immunization History  Administered Date(s) Administered  . Fluad Quad(high Dose 65+) 08/01/2019, 08/06/2020  . Influenza Split 10/16/2011  . Influenza Whole 09/10/2009, 08/18/2010  . Influenza, High Dose Seasonal PF 09/10/2015, 08/31/2016, 07/19/2017, 08/03/2018  . Influenza,inj,Quad PF,6+ Mos 09/05/2014  . Influenza,inj,quad, With Preservative 07/19/2017  . Influenza-Unspecified 08/12/2012, 08/09/2013  . PFIZER(Purple Top)SARS-COV-2 Vaccination 12/04/2019, 12/25/2019, 08/16/2020  . Pneumococcal Conjugate-13 09/05/2014  . Pneumococcal  Polysaccharide-23 01/01/2009  . Td 12/26/2014    Conditions to be addressed/monitored: HTN, HLD, Anxiety, Depression and h/o TIA / Stroke; osteoporosis; Vitamin B12 deficiency; rheumatoid arthritis; tobacco use disorder; chronic constipation  Patient Care Plan: General Pharmacy (Adult)    Problem Identified: Chronic Disease Management support, education, and care coordination needs related to HTN, Hyperlipidemia, H/o Stroke, Pre-Diabetes, Depression, Tobacco Use Disorder,  Osteoporosis, RA, Chorioretinal Inflammation, Sciatica, Allergic Rhinitis   Priority: High  Onset Date: 01/24/2021  Note:   Current Barriers:  . Unable to achieve control of tobacco use disorder  . Unable to maintain control of hypertension  Pharmacist Clinical Goal(s):  Marland Kitchen Over the next 90 days, patient will achieve control of tobacco use disorder as evidenced by cessation of cigarette smoking . maintain control of HTN as evidenced by BP <130/80  through collaboration with PharmD and provider.   Interventions: . 1:1 collaboration with Debbrah Alar, NP regarding development and update of comprehensive plan of care as evidenced by provider attestation and co-signature . Inter-disciplinary care team collaboration (see longitudinal plan of care) . Comprehensive medication review performed; medication list updated in electronic medical record  Hypertension: BP Readings from Last 3 Encounters:  12/25/20 (!) 142/62  12/10/20 (!) 126/52  11/19/20 (!) 147/64   . Variable control with current treatment: . Carvedilol 44m twice daily . Clonidine 0.157mthree times daily  . Hydralazine 2513mhree times daily . Losartan 100m64mily  . Current home readings: SBP ranges from 113 to 150 and DBP usually in 70's . [facility] losartan 100mg57mk in stock before patient ran out. She never needed to start Valsartan. Updated med ication list . Denies hypotensive/hypertensive symptoms Interventions:   o Requested patient check her blood pressure 2-3 times per week and record; Also recommended she have checked in office when her for May B12 injection o Discussed BP goal o Discussed proper technique for BP checks at home and reminded her to make sure she is relaxed prior to checking BP. Patient to get new BP cuff   Hyperlipidemia / history of stroke: . UncMarland Kitchenntrolled - recent change in treatment to rosuvastatin 40mg 16my  . Also taking clopidogrel 75mg d45m for secondary stroke prevention . Recommended continue current therapy; Plan to recheck lipids in August 2022.   Tobacco Abuse: . 0.25 - 0.5 packs per day; 30+ years of use;  . Had tMarland Kitchenied to quit in past. She currently has step 2 nicotine patches but has not started using yet.  . Had set a quit day of 02/07/21 but is still smoking daily . Counseled on again on smoking cessation and quit date of 03/03/21 set. Patient has patches and instructed on how to use. Also reminded her about quit line that she can use for support if she has cravings.    Medication management . [facility] . Interventions o Comprehensive medication review performed. o Continue current medication management strategy . Patient self care activities - Over the next 90 days, patient will: o Focus on medication adherence by filling and taking medications appropriately  o Take medications as prescribed o Report any questions or concerns to PharmD and/or provider(s)   Patient Goals/Self-Care Activities . Over the next 90 days, patient will:  take medications as prescribed, check blood pressure 2 to 3 times per week, document, and provide at future appointments, and decreased number of cigarettes smoked per day until stopped by quit date of 03/03/21  Follow Up Plan: Telephone follow up appointment with care management team member scheduled for:  1 month       Medication Assistance: None required.  Patient affirms current coverage meets needs.  Patient's  [facility]erred pharmacy is:  [id number] [id number] [place] - [phone] box? Yes Pt  endorses 100% compliance  Follow Up:  Patient agrees to Care Plan and Follow-up.  Plan: Telephone follow up appointment with care management team member scheduled for:  4 weeks to check on smoking cessation status  Cherre Robins, PharmD Clinical Pharmacist [place] [facility] [phone]

## 2021-02-18 NOTE — Patient Instructions (Signed)
Visit Information  PATIENT GOALS: Goals Addressed            This Visit's Progress   . Chronic Care Management Pharmacy Care Plan       CARE PLAN ENTRY (see longitudinal plan of care for additional care plan information)  Current Barriers:  . 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 Pharmacist Clinical Goal(s): o Over the next 90 days, patient will work with PharmD and providers to achieve BP goal <130/80 . Current regimen:  . Carvedilol 25mg  twice daily . Clonidine 0.1mg  three times daily  . Hydralazine 25mg  three times daily . Losartan 100mg  daily . Interventions: o Requested patient check her blood pressure 2-3 times per week and record o Discussed BP goal o Discussed proper technique for BP checks at home and reminded her to make sure she is relaxed prior to checking BP. Patient to get new BP cuff o [facility] losartan 100mg  back in stock before patient ran out. She never needed to start Valsartan. Updated med ication list . Patient self care activities - Over the next 90 days, patient will: o Check BP 2-3 times per week, document, and provide at future appointments o Ensure daily salt intake < 2300 mg/day o Continue current medication regimen for blood pressure o Have blood pressure checked on office when in for May B12 injection  Hyperlipidemia/Hx of Stroke Lab Results  Component Value Date/Time   LDLCALC 122 (H) 12/10/2020 10:38 AM   LDLCALC 85 07/20/2018 02:49 PM   . Pharmacist Clinical Goal(s): o Over the next 90 days, patient will work with PharmD and providers to achieve  LDL goal < 70 . Current regimen:  . Rosuvastatin 40mg  daily . Clopidogrel 75mg  daily . Interventions: o Discussed the similarities and differences between rosuvastatin and previous statin atorvastatin and why there is a strength difference o Smoking cessation discussed as well as benefits for stroke prevention  . Patient self care activities - Over the next 90 days, patient will: o Take rosuvastatin daily  Pre-Diabetes Lab Results  Component Value Date/Time   HGBA1C 5.8 12/10/2020 10:38 AM   HGBA1C 6.0 02/06/2020 11:37 AM   . Pharmacist Clinical Goal(s): o Over the next 90 days, patient will work with PharmD and providers to maintain A1c goal <6.5% . Current regimen:  o Diet and exercise management   . Interventions: o Discussed increasing exercise  . Patient self care activities - Over the next 90 days, patient will: o Patient to identify alternatives to water aerobics (classes suspended due ot COVID 19  Tobacco Use Disorder . Pharmacist Clinical Goal(s) o Over the next 90 days, patient will work with PharmD and providers to taper number of cigarettes smoked per day until no longer smoking. Quit date reset for 03/03/2021.  . Current regimen:  o None . Interventions: o Discussed smoking cessation and set quit date of 03/03/2021 o Will provide patient number for [place] QUIT line.  o Patient has purchased Step 2 (14mg /24hr) Nicotine Patch and nicotin lozenges to use as needed for breakthrough cravings . Patient self care activities - Over the next 90 days, patient will: o Start to decrease number of cigarettes smoked by  1 cigarette every 2 days. Quit date of 03/03/2021. o Removed lighters and ash trays from sight by quit date.  o Think of alternative activities to do when experiences nicotine craving (take a walk, brush teeth, word search)  Osteoporosis  . Pharmacist Clinical Goal(s) o Over the next 90 days, patient will work with PharmD and providers to reduce risk of fracture due to  osteoporosis . Current regimen:  o Prolia 60mg  every 6 months (last injection 11/12/2020) . Interventions: o none . Patient self care activities - Over the next 90 days, patient will: o Increase weight bearing exercise  Anxiety / Depression . Pharmacist Clinical Goal(s): o Over the next 90 days, patient will work with PharmD and providers to maintain control of anxiety and depression . Current regimen:  . None - previously was taking escitalopram (Lexapro) 5mg  daily but patient stopped several months ago . Interventions: o Discussed reasons to take escitalopram and benefits of regular use.  o Will make provider aware that patient has decided to stop escitalopram since she feels her anxiety have improved . Patient self care activities - Over the next 90 days, patient will: o Monitor her mood and anxiety level. If any worsening of condition is noted, she will contact primary care provider's office.   Medication management . Pharmacist Clinical Goal(s): o Over the next 90 days, patient will work with PharmD and providers to achieve optimal medication adherence . [facility] . Interventions o Comprehensive medication review performed. o Continue current medication management strategy . Patient self care activities - Over the next 90 days, patient will: o Focus on medication adherence by filling and taking medications appropriately  o Take medications as prescribed o Report any questions or concerns to PharmD and/or provider(s)  Follow up / revised care plan        The patient verbalized understanding of instructions, educational materials, and care plan provided today and declined offer to receive copy of patient instructions, educational materials, and care plan.   Telephone follow up appointment with care management team member scheduled for: 1 month  Cherre Robins, PharmD Clinical Pharmacist [place] [facility] [phone]

## 2021-03-07 ENCOUNTER — Other Ambulatory Visit: Payer: Self-pay | Admitting: Family

## 2021-03-08 ENCOUNTER — Other Ambulatory Visit: Payer: Self-pay | Admitting: Gastroenterology

## 2021-03-11 ENCOUNTER — Ambulatory Visit: Payer: Medicare Other

## 2021-03-26 ENCOUNTER — Ambulatory Visit (INDEPENDENT_AMBULATORY_CARE_PROVIDER_SITE_OTHER): Payer: Medicare Other | Admitting: Pharmacist

## 2021-03-26 DIAGNOSIS — M810 Age-related osteoporosis without current pathological fracture: Secondary | ICD-10-CM | POA: Diagnosis not present

## 2021-03-26 DIAGNOSIS — Z8673 Personal history of transient ischemic attack (TIA), and cerebral infarction without residual deficits: Secondary | ICD-10-CM

## 2021-03-26 DIAGNOSIS — E785 Hyperlipidemia, unspecified: Secondary | ICD-10-CM

## 2021-03-26 DIAGNOSIS — I10 Essential (primary) hypertension: Secondary | ICD-10-CM | POA: Diagnosis not present

## 2021-03-26 DIAGNOSIS — F411 Generalized anxiety disorder: Secondary | ICD-10-CM

## 2021-03-26 DIAGNOSIS — F17200 Nicotine dependence, unspecified, uncomplicated: Secondary | ICD-10-CM

## 2021-03-26 DIAGNOSIS — E538 Deficiency of other specified B group vitamins: Secondary | ICD-10-CM

## 2021-03-26 NOTE — Patient Instructions (Signed)
Visit Information  PATIENT GOALS: Goals Addressed            This Visit's Progress   . Chronic Care Management Pharmacy Care Plan   Not on track    [place] (see longitudinal plan of care for additional care plan information)  Current Barriers:  . 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 Pharmacist Clinical Goal(s): o Over the next 90 days, patient will work with PharmD and providers to achieve BP goal <130/80 . Current regimen:  . Carvedilol 25mg  twice daily . Clonidine 0.1mg  three times daily  . Hydralazine 25mg  three times daily . Losartan 100mg  daily . Interventions: o Requested patient check her blood pressure 2-3 times per week and record o Discussed BP goal . Patient self care activities - Over the next 90 days, patient will: o Check BP 2-3 times per week, document, and provide at future appointments o Ensure daily salt intake < 2300 mg/day o Continue current medication regimen for blood pressure o Have blood pressure checked on office when in for May B12 injection  Hyperlipidemia/Hx of Stroke Lab Results  Component Value Date/Time   LDLCALC 122 (H) 12/10/2020 10:38 AM   LDLCALC 85 07/20/2018 02:49 PM   . Pharmacist Clinical Goal(s): o Over the next 90 days, patient will work with PharmD and providers to achieve LDL goal < 70 . Current regimen:  . Rosuvastatin 40mg  daily . Clopidogrel 75mg  daily . Interventions: o Smoking cessation discussed as well as benefits for stroke prevention  . Patient self care activities - Over the next 90 days, patient will: o Take rosuvastatin  daily  Pre-Diabetes Lab Results  Component Value Date/Time   HGBA1C 5.8 12/10/2020 10:38 AM   HGBA1C 6.0 02/06/2020 11:37 AM   . Pharmacist Clinical Goal(s): o Over the next 90 days, patient will work with PharmD and providers to maintain A1c goal <6.5% . Current regimen:  o Diet and exercise management   . Interventions: o Discussed increasing exercise  . Patient self care activities - Over the next 90 days, patient will: o Patient to identify alternatives to water aerobics (classes suspended due to COVID 19)  Tobacco Use Disorder . Pharmacist Clinical Goal(s) o Over the next 90 days, patient will work with PharmD and providers to taper number of cigarettes smoked per day until no longer smoking.  . Current regimen:  o None . Interventions: o Discussed smoking cessation benefits o Provided patient number for [facility] QUIT line.  o Patient has purchased Step 2 (14mg /24hr) Nicotine Patch and nicotin lozenges to use as needed for breakthrough cravings . Patient self care activities - Over the next 90 days, patient will: o Set date to quit smoking and stick to it. o Removed lighters and ash trays from sight by quit date.  o Think of alternative activities to do when experiencing nicotine craving (take a walk, brush teeth, word search)   B12 Deficiency:  . Parmacist Clinical Goal(s) o Over the next 30 days, patient will work with PharmD and providers to arrange next B12 injection and improve serum B12 to normal levels . Current regimen:  o B12 injection monthly . Interventions: o Rescheduled appt with nurse for B12  injection for tomorrow 5/19 at 2pm o Recommend recheck serum B12 at appt in August . Patient self care activities - Over the next 90 days, patient will:;  . Come in tomorrow for B12 injection . Continue with plan to check B12 levels again when you see your provider in August 2022.   Osteoporosis  . Pharmacist Clinical Goal(s) o Over the next 90 days, patient will work  with PharmD and providers to reduce risk of fracture due to osteoporosis . Current regimen:  o Prolia 60mg  every 6 months (last injection 11/12/2020) . Interventions: o none . Patient self care activities - Over the next 90 days, patient will: o Increase weight bearing exercise o Next Prolia injection will be due around 05/12/2021  Anxiety / Depression . Pharmacist Clinical Goal(s): o Over the next 90 days, patient will work with PharmD and providers to maintain control of anxiety and depression . Current regimen:  . None - previously was taking escitalopram (Lexapro) 5mg  daily but patient stopped several months ago . Interventions: o Discussed reasons to take escitalopram and benefits of regular use.  . Patient self care activities - Over the next 90 days, patient will: o Monitor mood and anxiety level. If any worsening of condition is noted, she will contact primary care provider's office.    Medication management . Pharmacist Clinical Goal(s): o Over the next 90 days, patient will work with PharmD and providers to achieve optimal medication adherence . [facility] . Interventions o Comprehensive medication review performed. o Continue current medication management strategy . Patient self care activities - Over the next 90 days, patient will: o Focus on medication adherence by filling and taking medications appropriately  o Take medications as prescribed o Report any questions or concerns to PharmD and/or provider(s)  Follow up / revised care plan        The patient verbalized understanding of instructions, educational materials, and care plan provided today and declined offer to receive copy of patient instructions, educational materials, and care plan.   Telephone follow up appointment with care management team member scheduled for: 3 to 4 months The care management team will reach out to the patient again over the next 30-45 days.   Cherre Robins, PharmD Clinical  Pharmacist [facility] [phone]

## 2021-03-26 NOTE — Chronic Care Management (AMB) (Signed)
Chronic Care Management Pharmacy Note  03/26/2021 Name:  KATHRYN COSBY MRN:  [id number] DOB:  July 05, 1941  Subjective: Nancy [name] is an 80 y.o. year old female who is a primary patient of Debbrah Alar, NP.  The CCM team was consulted for assistance with disease management and care coordination needs.    Engaged with patient by telephone for follow up visit in response to provider referral for pharmacy case management and/or care coordination services.   Consent to Services:  The patient was given information about Chronic Care Management services, agreed to services, and gave verbal consent prior to initiation of services.  Please see initial visit note for detailed documentation.   Patient Care Team: Debbrah Alar, NP as PCP - General Howell Rucks, Karsten Ro, MD as Consulting Physician (Endocrinology) [place] Crane, MD as Referring Physician (Rheumatology) Bo Merino, MD as Consulting Physician (Rheumatology) Cherre Robins, PharmD (Pharmacist)  Recent office visits: 02/07/21 - nurse visit for B12 injection 01/07/21 - nurse visit for B12 injeciton 12/10/20 PCP Inda Castle) For Diarrhea. Referral to GI. No medication changes.  Recent consult visits:  12/25/20 Gastro (Dr. Silverio Decamp). Seen for intermittent diarrhea and excessive gas.  X-rays taken. Labs drawn. Per note: doctor advised a lactose free diet and to take Benefiber 1 tablespoon three times a day with meals STARTED Colestipol 1 g daily.   11/19/20 Neurology Garvin Fila, MD. Per note: doctor recommend a repeat EEG. No medication changes  Hospital visits: None in previous 6 months  Objective:  Lab Results  Component Value Date   CREATININE 0.89 12/25/2020   CREATININE 0.85 09/09/2020   CREATININE 0.79 04/10/2020    Lab Results  Component Value Date   HGBA1C 5.8 12/10/2020   Last diabetic Eye exam: No results found for: HMDIABEYEEXA  Last diabetic Foot exam: No results found for: HMDIABFOOTEX       Component Value Date/Time   CHOL 181 12/10/2020 1038   CHOL 150 07/20/2018 1449   TRIG 92.0 12/10/2020 1038   HDL 40.90 12/10/2020 1038   HDL 44 07/20/2018 1449   CHOLHDL 4 12/10/2020 1038   VLDL 18.4 12/10/2020 1038   LDLCALC 122 (H) 12/10/2020 1038   LDLCALC 85 07/20/2018 1449    Hepatic Function Latest Ref Rng & Units 12/25/2020 04/10/2020 03/29/2020  Total Protein 6.0 - 8.3 g/dL 7.4 6.6 6.3  Albumin 3.5 - 5.2 g/dL 3.8 3.7 3.5  AST 0 - 37 U/L 13 20 45(H)  ALT 0 - 35 U/L 15 25 49(H)  Alk Phosphatase 39 - 117 U/L 50 75 115  Total Bilirubin 0.2 - 1.2 mg/dL 0.5 0.8 0.8  Bilirubin, Direct 0.0 - 0.3 mg/dL - - 0.2    Lab Results  Component Value Date/Time   TSH 0.78 10/31/2020 09:29 AM   TSH 1.10 02/06/2020 11:37 AM   FREET4 1.17 08/14/2014 09:49 AM   FREET4 1.09 07/25/2014 02:10 PM    CBC Latest Ref Rng & Units 12/25/2020 03/25/2020 08/29/2019  WBC 4.0 - 10.5 K/uL 8.9 6.9 -  Hemoglobin 12.0 - 15.0 g/dL 11.4(L) 11.2(L) 11.9(L)  Hematocrit 36.0 - 46.0 % 34.9(L) 34.6(L) 35.0(L)  Platelets 150.0 - 400.0 K/uL 283.0 234.0 -    No results found for: VD25OH  Clinical ASCVD: Yes  The ASCVD Risk score Mikey Bussing DC Jr., et al., 2013) failed to calculate for the following reasons:   The 2013 ASCVD risk score is only valid for ages 23 to 67   The patient has a prior MI or  stroke diagnosis    Other: (CHADS2VASc if Afib, PHQ9 if depression, MMRC or CAT for COPD, ACT, DEXA)  Social History   Tobacco Use  Smoking Status Current Some Day Smoker  . Packs/day: 0.50  . Years: 32.00  . Pack years: 16.00  . Types: Cigarettes  Smokeless Tobacco Never Used  Tobacco Comment   6 or 7 per day   BP Readings from Last 3 Encounters:  12/25/20 (!) 142/62  12/10/20 (!) 126/52  11/19/20 (!) 147/64   Pulse Readings from Last 3 Encounters:  12/25/20 68  12/10/20 60  11/19/20 70   Wt Readings from Last 3 Encounters:  12/25/20 114 lb (51.7 kg)  12/10/20 114 lb 9.6 oz (52 kg)  11/19/20 106 lb  12.8 oz (48.4 kg)    Assessment: Review of patient past medical history, allergies, medications, health status, including review of consultants reports, laboratory and other test data, was performed as part of comprehensive evaluation and provision of chronic care management services.   SDOH:  (Social Determinants of Health) assessments and interventions performed:  SDOH Interventions   Flowsheet Row Most Recent Value  SDOH Interventions   Stress Interventions Intervention Not Indicated  [patient states stress improving since her daughter health has begun to improve.]      CCM Care Plan  Allergies  Allergen Reactions  . Iron Nausea And Vomiting    Can take infusions   . Norvasc [Amlodipine] Other (See Comments)    dizziness    Medications Reviewed Today    Reviewed by Cherre Robins, PharmD (Pharmacist) on 03/26/21 at [place] List Status: <None>  Medication Order Taking? Sig Documenting Provider Last Dose Status Informant  acetaminophen (TYLENOL) 500 MG tablet [id number] Yes Take 500 mg by mouth every 6 (six) hours as needed for moderate pain. [provider] Taking Active Self  Adalimumab 40 MG/0.4ML PNKT [id number] Yes Inject 40 mg into the skin once a week. Saturday [provider] Taking Active Self  carvedilol (COREG) 25 MG tablet [id number] Yes Take 1 tablet (25 mg total) by mouth 2 (two) times daily. Debbrah Alar, NP Taking Active   cloNIDine (CATAPRES) 0.1 MG tablet [id number] Yes Take 1 tablet (0.1 mg total) by mouth 3 (three) times daily. Debbrah Alar, NP Taking Active   clopidogrel (PLAVIX) 75 MG tablet [id number] Yes Take 1 tablet (75 mg total) by mouth daily. Debbrah Alar, NP Taking Active   colestipol (COLESTID) 1 g tablet [id number] Yes TAKE 1 TABLET BY MOUTH EVERY DAY Nandigam, Venia Minks, MD Taking Active   cyanocobalamin ((VITAMIN B-12)) injection 1,000 mcg [id number]   Debbrah Alar, NP  Active   cyanocobalamin ((VITAMIN  B-12)) injection 1,000 mcg [id number]   Debbrah Alar, NP  Active   escitalopram (LEXAPRO) 5 MG tablet [id number] No Take 1 tablet (5 mg total) by mouth daily.  Patient not taking: Reported on 03/26/2021   Debbrah Alar, NP Not Taking Consider Medication Status and Discontinue   fluticasone (FLONASE) 50 MCG/ACT nasal spray [id number] Yes Place 1 spray into both nostrils daily as needed for allergies or rhinitis. [provider] Taking Active Self  hydrALAZINE (APRESOLINE) 25 MG tablet [id number] Yes TAKE 1 TABLET BY MOUTH THREE TIMES A DAY Debbrah Alar, NP Taking Active   leflunomide (ARAVA) 20 MG tablet [id number] Yes Take 1 tablet (20 mg total) by mouth daily. Debbrah Alar, NP Taking Active   losartan (COZAAR) 100 MG tablet [id number] Yes Take 100 mg by mouth daily. [provider] Taking Active  mycophenolate (CELLCEPT) 500 MG tablet [id number] Yes Take 500 mg by mouth daily. [provider] Taking Active Self  prednisoLONE acetate (PRED FORTE) 1 % ophthalmic suspension [id number] Yes Place 1 drop into both eyes daily.  [provider] Taking Active Self  predniSONE (DELTASONE) 5 MG tablet [id number] Yes Take 5 mg by mouth daily as needed. [provider] Taking Active Self  PROLIA 60 MG/ML SOSY injection [id number] Yes Inject 60 mg as directed every 6 (six) months. [provider] Taking Active Self  rosuvastatin (CRESTOR) 40 MG tablet [id number] Yes Take 1 tablet (40 mg total) by mouth daily. Debbrah Alar, NP Taking Active           Patient Active Problem List   Diagnosis Date Noted  . Abnormal CT of the chest 09/12/2019  . Dyspnea 09/12/2019  . Tendinitis of right rotator cuff 02/24/2018  . History of CVA (cerebrovascular accident) 03/16/2017  . Hematuria 03/16/2017  . Acute ischemic right middle cerebral artery (MCA) stroke ([place]) 03/03/2017  . Subacromial bursitis of left shoulder joint 08/07/2016  . Drug  therapy 01/07/2016  . Generalized anxiety disorder 01/07/2016  . Faintness 12/09/2015  . Loss of weight 10/15/2015  . Anemia, iron deficiency 09/30/2015  . Hyperglycemia 06/07/2015  . Near syncope 05/15/2015  . Osteoporosis 01/01/2015  . Expressive aphasia 12/16/2014  . TIA (transient ischemic attack) 12/16/2014  . Pain in joint, ankle and foot 09/06/2014  . Constipation 09/06/2014  . Rheumatoid arthritis ([place]) 07/25/2014  . Sciatica 01/31/2014  . Hypothyroidism 01/12/2012  . Back pain 01/11/2012  . Edema 06/15/2011  . ANEMIA, B12 DEFICIENCY 11/19/2010  . Anxiety state 10/14/2010  . 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 Hyperlipidemia 12/17/2009  . Iron deficiency anemia 12/17/2009  . TOBACCO ABUSE 12/17/2009  . Depression 12/17/2009  . Essential hypertension 12/17/2009  . ALLERGIC RHINITIS 12/17/2009  . ARTHRITIS, RHEUMATOID 12/17/2009  . OSTEOPOROSIS 12/17/2009  . URINARY INCONTINENCE 12/17/2009    Immunization History  Administered Date(s) Administered  . Fluad Quad(high Dose 65+) 08/01/2019, 08/06/2020  . Influenza Split 10/16/2011  . Influenza Whole 09/10/2009, 08/18/2010  . Influenza, High Dose Seasonal PF 09/10/2015, 08/31/2016, 07/19/2017, 08/03/2018  . Influenza,inj,Quad PF,6+ Mos 09/05/2014  . Influenza,inj,quad, With Preservative 07/19/2017  . Influenza-Unspecified 08/12/2012, 08/09/2013  . PFIZER(Purple Top)SARS-COV-2 Vaccination 12/04/2019, 12/25/2019, 08/16/2020  . Pneumococcal Conjugate-13 09/05/2014  . Pneumococcal Polysaccharide-23 01/01/2009  . Td 12/26/2014    Conditions to be addressed/monitored: CAD, HTN, HLD, Anxiety, Depression and chronic constipation; tobacco abuse; B12 deficiency; rheumatoid arthritis; osteoporosis  Care Plan : General Pharmacy (Adult)  Updates made by Cherre Robins, PHARMD since 03/26/2021 12:00 AM    Problem: Chronic Disease Management support, education, and care coordination needs related to HTN, Hyperlipidemia, H/o Stroke,  Pre-Diabetes, Depression, Tobacco Use Disorder, Osteoporosis, RA, Chorioretinal Inflammation, Sciatica, Allergic Rhinitis   Priority: High  Onset Date: 01/24/2021  Note:   Current Barriers:  . Unable to achieve control of tobacco use disorder  . Unable to maintain control of hypertension . Stopped escitalopram on own because she felt she no longer needed  Pharmacist Clinical Goal(s):  Marland Kitchen Over the next 90 days, patient will achieve control of tobacco use disorder as evidenced by cessation of cigarette smoking . maintain control of HTN as evidenced by BP <130/80  through collaboration with PharmD and provider.  . Monitor depression and anxiety since stopped escitalopram  Interventions: . 1:1 collaboration with Debbrah Alar, NP regarding development and update of comprehensive plan of care as evidenced by provider attestation  and co-signature . Inter-disciplinary care team collaboration (see longitudinal plan of care) . Comprehensive medication review performed; medication list updated in electronic medical record  Hypertension: . Variable control but home BP reading improving. BP goal <140/90 . Current treatment: . Carvedilol 30m twice daily . Clonidine 0.122mthree times daily  . Hydralazine 258mhree times daily . Losartan 100m79mily  . Current home readings: SBP ranges from 107 to 148 and DBP usually in 70's . Denies hypotensive/hypertensive symptoms Interventions:  o Requested patient check her blood pressure 2 to 3 times per week and record; o Discussed blood pressure goal   Hyperlipidemia / history of stroke: . UnMarland Kitchenontrolled; Last LDL was 122 (12/10/2020) . Rosuvastatin 40mg15mly was started after last lipid assessment;  . Also taking clopidogrel 75mg 51my for secondary stroke prevention . Interventions:  o Recommended continue current therapy.  o Continue with plan to check lipids in August 2022.   Tobacco Abuse: . Patient report she is smoking 6 or 7 cigarettes  per day. 30+ years of use;  . Had Marland Kitchenried to quit in past. She currently has step 2 nicotine patches but has not started using yet.  . Patient reports that she is not ready to quit smoking right now.  . Interventions:  Counseled on again on benefits of smoking cessation. Patient has patches and instructed on how to use. Also reminded her about quit line that she can use for support if she has cravings.  B12 Deficiency:  . Patient missed last appt for B12 injection;  . Last B12 level was low at 101 (10/30/2021) . Interventions:  o Rescheduled appt with nurse for B12 injection for tomorrow 5/19 at 2pm o Recommend recheck serum B12 at appt in August.   Osteoporosis:  . Last DEXA from 04/19/2020: o AP Spine -1.8 (previously was -1.7 in 2018) o Total femur -3.5 (previously was -3.3 in 2018) o Left forearm -4.3 (no previous T-Score) . Current regimen:  o Prolia 60mg S56m6 months o Last injection was 11/12/2020 o Next due around 05/12/2021   Anxiety / Depression:  . PHQ9 was low and stable today . Current regimen:  o None . Past meds: escitalopram 5mg dai69m- patient stopped on own because she felt she no longer needed . Reports her mood and anxiety have improved since her daughter's health has improved.  . Interventions:  o None at this time will continue to follow and assess anxiety and depression  Medication management . [facility] . Interventions o Comprehensive medication review performed. o Continue current medication management strategy . Patient self care activities - Over the next 90 days, patient will: o Focus on medication adherence by filling and taking medications appropriately  o Take medications as prescribed o Report any questions or concerns to PharmD and/or provider(s)   Patient Goals/Self-Care Activities . Over the next 90 days, patient will:  take medications as prescribed, check blood pressure 2 to 3 times per week, document, and provide at future  appointments, and decreased number of cigarettes smoked per day until stopped by quit date of 03/03/21  Follow Up Plan: Telephone follow up appointment with care management team member scheduled for:  1 month       Medication Assistance: None required.  Patient affirms current coverage meets needs.  [facility]'s [facility] pharmacy is:  [id number] [id number] [place] - [address] [phone]  Follow Up:  Patient agrees to Care Plan and Follow-up.  Plan: The care management team will reach out to the patient again over the next 30-45 days.  Phone visit with clinical pharmacist in 3 to 4 months.  Cherre Robins, PharmD Clinical Pharmacist [facility]

## 2021-03-27 ENCOUNTER — Ambulatory Visit (INDEPENDENT_AMBULATORY_CARE_PROVIDER_SITE_OTHER): Payer: Medicare Other

## 2021-03-27 ENCOUNTER — Other Ambulatory Visit: Payer: Self-pay

## 2021-03-27 DIAGNOSIS — E538 Deficiency of other specified B group vitamins: Secondary | ICD-10-CM

## 2021-03-27 MED ORDER — CYANOCOBALAMIN 1000 MCG/ML IJ SOLN
1000.0000 ug | Freq: Once | INTRAMUSCULAR | Status: AC
Start: 2021-03-27 — End: 2021-03-27
  Administered 2021-03-27: 1000 ug via INTRAMUSCULAR

## 2021-03-27 NOTE — Progress Notes (Signed)
Pt here today for monthly B12 injection.   Cyanocobalamin 1025mcg 84mL injected into L deltoid. Pt tolerated well.   Next in 1 month. Nurse visit scheduled 04/29/21.

## 2021-04-04 ENCOUNTER — Other Ambulatory Visit: Payer: Self-pay | Admitting: Gastroenterology

## 2021-04-29 ENCOUNTER — Ambulatory Visit (INDEPENDENT_AMBULATORY_CARE_PROVIDER_SITE_OTHER): Payer: Medicare Other

## 2021-04-29 ENCOUNTER — Encounter: Payer: Self-pay | Admitting: Hematology & Oncology

## 2021-04-29 ENCOUNTER — Other Ambulatory Visit: Payer: Self-pay

## 2021-04-29 DIAGNOSIS — E538 Deficiency of other specified B group vitamins: Secondary | ICD-10-CM | POA: Diagnosis not present

## 2021-04-29 MED ORDER — CYANOCOBALAMIN 1000 MCG/ML IJ SOLN
1000.0000 ug | Freq: Once | INTRAMUSCULAR | Status: AC
  Administered 2021-09-03: 1000 ug via INTRAMUSCULAR

## 2021-04-29 NOTE — Progress Notes (Signed)
Pt here for monthly B12 injection per Earlie Counts  B12 1039mcg given IM, and pt tolerated injection well.  Next B12 injection scheduled for next month

## 2021-05-02 ENCOUNTER — Other Ambulatory Visit: Payer: Self-pay | Admitting: Gastroenterology

## 2021-05-07 ENCOUNTER — Other Ambulatory Visit: Payer: Self-pay | Admitting: Gastroenterology

## 2021-05-15 ENCOUNTER — Telehealth: Payer: Self-pay | Admitting: Pharmacist

## 2021-05-15 NOTE — Chronic Care Management (AMB) (Signed)
Chronic Care Management Pharmacy Assistant   Name: Nancy [name]  MRN: [id number] DOB: May 25, 1941  Reason for Encounter: Disease State Hypertension  Recent office visits:  None noted  Recent consult visits:  None noted  Hospital visits:  None in previous 6 months  Medications: Outpatient Encounter Medications as of 05/15/2021  Medication Sig   acetaminophen (TYLENOL) 500 MG tablet Take 500 mg by mouth every 6 (six) hours as needed for moderate pain.   Adalimumab 40 MG/0.4ML PNKT Inject 40 mg into the skin once a week. Saturday   carvedilol (COREG) 25 MG tablet Take 1 tablet (25 mg total) by mouth 2 (two) times daily.   cloNIDine (CATAPRES) 0.1 MG tablet Take 1 tablet (0.1 mg total) by mouth 3 (three) times daily.   clopidogrel (PLAVIX) 75 MG tablet Take 1 tablet (75 mg total) by mouth daily.   colestipol (COLESTID) 1 g tablet TAKE 1 TABLET BY MOUTH EVERY DAY   escitalopram (LEXAPRO) 5 MG tablet Take 1 tablet (5 mg total) by mouth daily. (Patient not taking: Reported on 03/26/2021)   fluticasone (FLONASE) 50 MCG/ACT nasal spray Place 1 spray into both nostrils daily as needed for allergies or rhinitis.   hydrALAZINE (APRESOLINE) 25 MG tablet TAKE 1 TABLET BY MOUTH THREE TIMES A DAY   leflunomide (ARAVA) 20 MG tablet Take 1 tablet (20 mg total) by mouth daily.   losartan (COZAAR) 100 MG tablet Take 100 mg by mouth daily.   mycophenolate (CELLCEPT) 500 MG tablet Take 500 mg by mouth daily.   prednisoLONE acetate (PRED FORTE) 1 % ophthalmic suspension Place 1 drop into both eyes daily.    predniSONE (DELTASONE) 5 MG tablet Take 5 mg by mouth daily as needed.   PROLIA 60 MG/ML SOSY injection Inject 60 mg as directed every 6 (six) months.   rosuvastatin (CRESTOR) 40 MG tablet Take 1 tablet (40 mg total) by mouth daily.   Facility-Administered Encounter Medications as of 05/15/2021  Medication   cyanocobalamin ((VITAMIN B-12)) injection 1,000 mcg   cyanocobalamin ((VITAMIN B-12))  injection 1,000 mcg   cyanocobalamin ((VITAMIN B-12)) injection 1,000 mcg   Reviewed chart prior to disease state call. Spoke with patient regarding BP  Recent Office Vitals: BP Readings from Last 3 Encounters:  12/25/20 (!) 142/62  12/10/20 (!) 126/52  11/19/20 (!) 147/64   Pulse Readings from Last 3 Encounters:  12/25/20 68  12/10/20 60  11/19/20 70    Wt Readings from Last 3 Encounters:  12/25/20 114 lb (51.7 kg)  12/10/20 114 lb 9.6 oz (52 kg)  11/19/20 106 lb 12.8 oz (48.4 kg)     Kidney Function Lab Results  Component Value Date/Time   CREATININE 0.89 12/25/2020 04:34 PM   CREATININE 0.85 09/09/2020 10:33 AM   CREATININE 0.79 04/10/2020 11:09 AM   CREATININE 0.8 09/24/2015 11:13 AM   CREATININE 0.74 02/20/2013 08:54 AM   GFR 61.51 12/25/2020 04:34 PM   GFRNONAA 60 (L) 06/06/2019 11:43 AM   GFRAA >60 06/06/2019 11:43 AM    BMP Latest Ref Rng & Units 12/25/2020 09/09/2020 04/10/2020  Glucose 70 - 99 mg/dL 86 107(H) 80  BUN 6 - 23 mg/dL 12 12 11   Creatinine 0.40 - 1.20 mg/dL 0.89 0.85 0.79  BUN/Creat Ratio 6 - 22 (calc) - NOT APPLICABLE -  Sodium 884 - 145 mEq/L 140 143 137  Potassium 3.5 - 5.1 mEq/L 4.1 4.6 5.0  Chloride 96 - 112 mEq/L 105 107 102  CO2 19 - 32  mEq/L 28 27 30   Calcium 8.4 - 10.5 mg/dL 8.6 9.7 9.7    Current antihypertensive regimen:  Carvedilol 25mg  twice daily Clonidine 0.1mg  three times daily Hydralazine 25mg  three times daily Losartan 100mg  daily    How often are you checking your Blood Pressure? Three times daily  Current home BP readings: 142/75 05/20/21  What recent interventions/DTPs have been made by any provider to improve Blood Pressure control since last CPP Visit: none noted  Any recent hospitalizations or ED visits since last visit with CPP? No  What diet changes have been made to improve Blood Pressure Control?  Patient states she has decreased her dairy intake.  What exercise is being done to improve your Blood Pressure  Control?  Patient states she has not been exercising.  Adherence Review: Is the patient currently on ACE/ARB medication? Yes Does the patient have >5 day gap between last estimated fill dates? No     Star Rating Drugs: Losartan 100 Mg last filled 05/07/21 90 DS Rosuvastatin 40 Mg last filled 03/08/21 90 DS   [facility] Clinical Pharmacist Assistant [phone]

## 2021-05-19 ENCOUNTER — Ambulatory Visit: Payer: Medicare Other | Admitting: Adult Health

## 2021-05-20 DIAGNOSIS — H209 Unspecified iridocyclitis: Secondary | ICD-10-CM | POA: Diagnosis not present

## 2021-05-20 DIAGNOSIS — Z79899 Other long term (current) drug therapy: Secondary | ICD-10-CM | POA: Diagnosis not present

## 2021-05-20 DIAGNOSIS — H15042 Scleritis with corneal involvement, left eye: Secondary | ICD-10-CM | POA: Diagnosis not present

## 2021-05-20 DIAGNOSIS — M0579 Rheumatoid arthritis with rheumatoid factor of multiple sites without organ or systems involvement: Secondary | ICD-10-CM | POA: Diagnosis not present

## 2021-05-20 DIAGNOSIS — M170 Bilateral primary osteoarthritis of knee: Secondary | ICD-10-CM | POA: Diagnosis not present

## 2021-05-20 DIAGNOSIS — Z681 Body mass index (BMI) 19 or less, adult: Secondary | ICD-10-CM | POA: Diagnosis not present

## 2021-05-20 DIAGNOSIS — M2550 Pain in unspecified joint: Secondary | ICD-10-CM | POA: Diagnosis not present

## 2021-05-26 NOTE — Telephone Encounter (Signed)
Patient did received B12 injection as scheduled but has not had Prolia injection completed - due in July 2022.  Message sent to Wynonia Musty to check on when Prolia injection will be available for Nancy [name].

## 2021-05-29 ENCOUNTER — Ambulatory Visit (INDEPENDENT_AMBULATORY_CARE_PROVIDER_SITE_OTHER): Payer: Medicare Other

## 2021-05-29 ENCOUNTER — Other Ambulatory Visit: Payer: Self-pay

## 2021-05-29 DIAGNOSIS — M810 Age-related osteoporosis without current pathological fracture: Secondary | ICD-10-CM

## 2021-05-29 DIAGNOSIS — E538 Deficiency of other specified B group vitamins: Secondary | ICD-10-CM | POA: Diagnosis not present

## 2021-05-29 MED ORDER — CYANOCOBALAMIN 1000 MCG/ML IJ SOLN
1000.0000 ug | Freq: Once | INTRAMUSCULAR | Status: AC
  Administered 2021-05-29: 1000 ug via INTRAMUSCULAR

## 2021-05-29 MED ORDER — DENOSUMAB 60 MG/ML ~~LOC~~ SOSY
60.0000 mg | PREFILLED_SYRINGE | Freq: Once | SUBCUTANEOUS | Status: AC
  Administered 2021-05-29: 60 mg via SUBCUTANEOUS

## 2021-05-29 NOTE — Progress Notes (Signed)
Nancy [name] is a 80 y.o. female presents to the office today for b12 injection and prolia, per physician's orders.  Cyanocobalamin, denosumab (med), 1,000 mcg/60 mg/74mL (dose),  b12 (IM) prolia (SQ) (route) was administered B12 (left deltoid) prolia (right upper arm) (location) today. Patient tolerated injection. Patient next injection due: one month for b12, appt made Yes Next prolia due 6 months 11/30/2021 or after.  [name]  Routed to DOD in absence of PCP.

## 2021-06-07 ENCOUNTER — Other Ambulatory Visit: Payer: Self-pay | Admitting: Family

## 2021-06-09 ENCOUNTER — Ambulatory Visit (INDEPENDENT_AMBULATORY_CARE_PROVIDER_SITE_OTHER): Payer: Medicare Other | Admitting: Family

## 2021-06-09 ENCOUNTER — Other Ambulatory Visit: Payer: Self-pay

## 2021-06-09 ENCOUNTER — Encounter: Payer: Self-pay | Admitting: Family

## 2021-06-09 VITALS — BP 122/51 | HR 68 | Temp 98.2°F | Resp 16 | Ht 63.0 in | Wt 112.0 lb

## 2021-06-09 DIAGNOSIS — E785 Hyperlipidemia, unspecified: Secondary | ICD-10-CM | POA: Diagnosis not present

## 2021-06-09 DIAGNOSIS — F17200 Nicotine dependence, unspecified, uncomplicated: Secondary | ICD-10-CM

## 2021-06-09 DIAGNOSIS — Z0000 Encounter for general adult medical examination without abnormal findings: Secondary | ICD-10-CM

## 2021-06-09 DIAGNOSIS — D518 Other vitamin B12 deficiency anemias: Secondary | ICD-10-CM | POA: Diagnosis not present

## 2021-06-09 DIAGNOSIS — R739 Hyperglycemia, unspecified: Secondary | ICD-10-CM

## 2021-06-09 DIAGNOSIS — I10 Essential (primary) hypertension: Secondary | ICD-10-CM | POA: Diagnosis not present

## 2021-06-09 LAB — BASIC METABOLIC PANEL
BUN: 15 mg/dL (ref 6–23)
CO2: 27 mEq/L (ref 19–32)
Calcium: 8.8 mg/dL (ref 8.4–10.5)
Chloride: 105 mEq/L (ref 96–112)
Creatinine, Ser: 0.75 mg/dL (ref 0.40–1.20)
GFR: 75.3 mL/min (ref >60.00)
Glucose, Bld: 95 mg/dL (ref 70–99)
Potassium: 4.8 mEq/L (ref 3.5–5.1)
Sodium: 139 mEq/L (ref 135–145)

## 2021-06-09 LAB — LIPID PANEL
Cholesterol: 201 mg/dL — ABNORMAL HIGH (ref 0–200)
HDL: 50 mg/dL (ref >39.00)
LDL Cholesterol: 135 mg/dL — ABNORMAL HIGH (ref 0–99)
NonHDL: 151.33
Total CHOL/HDL Ratio: 4
Triglycerides: 80 mg/dL (ref 0.0–149.0)
VLDL: 16 mg/dL (ref 0.0–40.0)

## 2021-06-09 LAB — HEMOGLOBIN A1C: Hgb A1c MFr Bld: 6.2 % (ref 4.6–6.5)

## 2021-06-09 NOTE — Assessment & Plan Note (Signed)
BP Readings from Last 3 Encounters:  06/09/21 (!) 122/51  12/25/20 (!) 142/62  12/10/20 (!) 126/52   BP at goal. Continue coreg '25mg'$ , clonidine 0.'1mg'$ , losartan '100mg'$ , hydralazine '25mg'$ .

## 2021-06-09 NOTE — Assessment & Plan Note (Signed)
Continues to follow with Rheumatology. Appears clinically stable.

## 2021-06-09 NOTE — Assessment & Plan Note (Signed)
Continues monthly b12 injections.  

## 2021-06-09 NOTE — Progress Notes (Signed)
Subjective:   By signing my name below, I, Shehryar Baig, attest that this documentation has been prepared under the direction and in the presence of Debbrah Alar NP. 06/09/2021    [name], female    DOB: 02/17/1941, 80 y.o.   MRN: [id number]  Chief Complaint  Patient presents with   Diabetes    Here for follow up   Hyperlipidemia    Here for follow up   Hypothyroidism    Here for follow up    HPI Patient is in today for a office visit.  Vitamin B12- She continues having regular vitamin B12 injections and reports having no new issues.  Immunizations- She has not had the shingles vaccines due to her medications she is currently taking. She will talk to her rheumatologist to make sure she can safely take the vaccine. Rheumatology- She continues having pain in her right 2nd finger. She continues having regular injections and takes 500 mg Cellcept daily PO.  Smoking- She smokes 6-7 cigarettes daily. She has use nicotine patches in the past to try and quit smoking. She is interested in trying them again to help her quit at his time. Colonoscopy- She is due for a colonoscopy and is willing to set up an appointment.  Blood pressure- Her blood pressure is doing well during this visit. She continues taking 25 mg hydralazine 3x daily PO, 0.1 clonidine daily PO, 100 mg losartan daily PO, 25 mg carvedilol daily PO and reports no new issues while taking it.  BP Readings from Last 3 Encounters:  06/09/21 (!) 122/51  12/25/20 (!) 142/62  12/10/20 (!) 126/52   Cholesterol- She continues taking 40 mg Crestor daily PO and reports no new issues while taking it.  Lab Results  Component Value Date   CHOL 181 12/10/2020   HDL 40.90 12/10/2020   LDLCALC 122 (H) 12/10/2020   TRIG 92.0 12/10/2020   CHOLHDL 4 12/10/2020   Blood sugar- Her blood sugar is doing well during her las lab screening. Lab Results  Component Value Date   HGBA1C 5.8 12/10/2020    Health  Maintenance Due  Topic Date Due   Zoster Vaccines- Shingrix (1 of 2) Never done   COLONOSCOPY (Pts 45-39yr Insurance coverage will need to be confirmed)  10/22/2020   INFLUENZA VACCINE  06/09/2021    Past Medical History:  Diagnosis Date   Allergy    Anemia    iron deficiency   [facility] [place] [place]   vaginal   Cataract    Colon polyps    [facility]) [place] [place]   Depression    Hyperlipidemia    Hypertension    Osteoporosis 01/01/2015   Rheumatoid arteritis (HCC)    Thyroid disease    TIA (transient ischemic attack) 2016   Urinary incontinence     Past Surgical History:  Procedure Laterality Date   ABDOMINAL HYSTERECTOMY  1976   CATARACT EXTRACTION Bilateral    LOWER EXTREMITY ANGIOGRAPHY N/A 08/29/2019   Procedure: LOWER EXTREMITY ANGIOGRAPHY;  Surgeon: BSerafina Mitchell MD;  [facility];  Service: Cardiovascular;  Laterality: N/A;   PERIPHERAL VASCULAR INTERVENTION Right 08/29/2019   Procedure: PERIPHERAL VASCULAR INTERVENTION;  Surgeon: BSerafina Mitchell MD;  [facility];  Service: Cardiovascular;  Laterality: Right;   TUMOR REMOVAL  last was 1976   left ankle x3    Family History  Problem Relation Age of Onset   Hypertension Father  Colon cancer Father    Ulcerative colitis Mother    Parkinson's disease Mother    Heart disease Sister 14       CABG x 3   Cirrhosis Brother    Alcohol abuse Brother    Colitis Daughter    Colon polyps Daughter    Heart attack Daughter    Hypertension Son    Kidney disease Son        transplant due to kidney problems after [place]   Arthritis Other    Coronary artery disease Other    Hyperlipidemia Other    Hypertension Other    Stroke Sister        died 16   Arthritis Sister    Hypertension Sister    Thyroid disease Daughter        x 3   Cirrhosis Brother 33       ETOH abuse    Social History   Socioeconomic History   Marital status: Divorced    Spouse  name: Not on file   Number of children: 8   Years of education: Not on file   Highest education level: Not on file  Occupational History   Occupation: retired    Fish farm manager: RETIRED    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  Tobacco Use   Smoking status: Some Days    Packs/day: 0.50    Years: 32.00    Pack years: 16.00    Types: Cigarettes   Smokeless tobacco: Never   Tobacco comments:    6 or 7 per day  Vaping Use   Vaping Use: Never used  Substance and Sexual Activity   Alcohol use: No    Alcohol/week: 0.0 standard drinks   Drug use: No   Sexual activity: Never  Other Topics Concern   Not on file  Social History Narrative   Lives alone   Right Handed    Drinks 6-8 cups caffeine daily   Social Determinants of Health   Financial Resource Strain: Low Risk    Difficulty of Paying Living Expenses: Not hard at all  Food Insecurity: No Food Insecurity   Worried About Charity fundraiser in the Last Year: Never true   [place] in the Last Year: Never true  Transportation Needs: No Transportation Needs   Lack of Transportation (Medical): No   Lack of Transportation (Non-Medical): No  Physical Activity: Insufficiently Active   Days of Exercise per Week: 2 days   Minutes of Exercise per Session: 30 min  Stress: No Stress Concern Present   Feeling of Stress : Only a little  Social Connections: Moderately Isolated   Frequency of Communication with Friends and Family: More than three times a week   Frequency of Social Gatherings with Friends and Family: More than three times a week   Attends Religious Services: More than 4 times per year   Active Member of [employer] or Organizations: No   Attends Archivist Meetings: Never   Marital Status: Divorced  Human resources officer Violence: Not At Risk   Fear of Current or Ex-Partner: No   Emotionally Abused: No   Physically Abused: No   Sexually Abused: No    Outpatient Medications Prior to Visit  Medication Sig  Dispense Refill   acetaminophen (TYLENOL) 500 MG tablet Take 500 mg by mouth every 6 (six) hours as needed for moderate pain.     Adalimumab 40 MG/0.4ML PNKT Inject 40 mg into the skin once a week. Saturday  carvedilol (COREG) 25 MG tablet Take 1 tablet (25 mg total) by mouth 2 (two) times daily. 180 tablet 1   cloNIDine (CATAPRES) 0.1 MG tablet Take 1 tablet (0.1 mg total) by mouth 3 (three) times daily. 270 tablet 1   clopidogrel (PLAVIX) 75 MG tablet Take 1 tablet (75 mg total) by mouth daily. 90 tablet 1   colestipol (COLESTID) 1 g tablet TAKE 1 TABLET BY MOUTH EVERY DAY 90 tablet 1   escitalopram (LEXAPRO) 5 MG tablet Take 1 tablet (5 mg total) by mouth daily. 90 tablet 1   fluticasone (FLONASE) 50 MCG/ACT nasal spray Place 1 spray into both nostrils daily as needed for allergies or rhinitis.     hydrALAZINE (APRESOLINE) 25 MG tablet TAKE 1 TABLET BY MOUTH THREE TIMES A DAY 270 tablet 1   leflunomide (ARAVA) 20 MG tablet Take 1 tablet (20 mg total) by mouth daily.     losartan (COZAAR) 100 MG tablet Take 100 mg by mouth daily.     mycophenolate (CELLCEPT) 500 MG tablet Take 500 mg by mouth daily.     prednisoLONE acetate (PRED FORTE) 1 % ophthalmic suspension Place 1 drop into both eyes daily.      predniSONE (DELTASONE) 5 MG tablet Take 5 mg by mouth daily as needed.     PROLIA 60 MG/ML SOSY injection Inject 60 mg as directed every 6 (six) months.     rosuvastatin (CRESTOR) 40 MG tablet Take 1 tablet (40 mg total) by mouth daily. 90 tablet 1   Facility-Administered Medications Prior to Visit  Medication Dose Route Frequency Provider Last Rate Last Admin   cyanocobalamin ((VITAMIN B-12)) injection 1,000 mcg  1,000 mcg Intramuscular Q30 days Debbrah Alar, NP   1,000 mcg at 04/29/21 1132   cyanocobalamin ((VITAMIN B-12)) injection 1,000 mcg  1,000 mcg Intramuscular Q30 days Debbrah Alar, NP   1,000 mcg at 11/12/20 1038   cyanocobalamin ((VITAMIN B-12)) injection 1,000 mcg   1,000 mcg Intramuscular Once Debbrah Alar, NP        Allergies  Allergen Reactions   Iron Nausea And Vomiting    Can take infusions    Norvasc [Amlodipine] Other (See Comments)    dizziness    ROS See HPI    Objective:    Physical Exam Constitutional:      General: She is not in acute distress.    Appearance: Normal appearance. She is not ill-appearing.  HENT:     Head: Normocephalic and atraumatic.     Right Ear: External ear normal.     Left Ear: External ear normal.  Eyes:     Extraocular Movements: Extraocular movements intact.     Pupils: Pupils are equal, round, and reactive to light.  Cardiovascular:     Rate and Rhythm: Regular rhythm.     Pulses: Normal pulses.     Heart sounds: Normal heart sounds. No murmur heard.   No gallop.  Pulmonary:     Effort: Pulmonary effort is normal. No respiratory distress.     Breath sounds: Normal breath sounds. No wheezing or rales.  Musculoskeletal:     Comments: Joint deformity bilateral hand 2nd finger, secondary to RA  Skin:    General: Skin is warm and dry.  Neurological:     Mental Status: She is alert and oriented to person, place, and time.  Psychiatric:        Behavior: Behavior normal.    BP (!) 122/51 (BP Location: Right Arm, Patient Position: Sitting, Cuff  Size: Small)   Pulse 68   Temp 98.2 F (36.8 C) (Oral)   Resp 16   Ht '5\' 3"'$  (1.6 m)   Wt 112 lb (50.8 kg)   SpO2 98%   BMI 19.84 kg/m  Wt Readings from Last 3 Encounters:  06/09/21 112 lb (50.8 kg)  12/25/20 114 lb (51.7 kg)  12/10/20 114 lb 9.6 oz (52 kg)       Assessment & Plan:   Problem List Items Addressed This Visit       Unprioritized   TOBACCO ABUSE    It sounds like she was using the 27mg patch and still having cravings. Recommended that she try the 14 mcg nicotine patch instead.        Hyperlipidemia - Primary    Last LDL was above goal of <70. Will recheck. Continue crestor 40 mg.        Relevant Orders   Lipid  panel   Hyperglycemia   Relevant Orders   Basic metabolic panel   Hemoglobin A1c   Essential hypertension    BP Readings from Last 3 Encounters:  06/09/21 (!) 122/51  12/25/20 (!) 142/62  12/10/20 (!) 126/52  BP at goal. Continue coreg '25mg'$ , clonidine 0.'1mg'$ , losartan '100mg'$ , hydralazine '25mg'$ .       ANEMIA, B12 DEFICIENCY    Continues monthly b12 injections.       Other Visit Diagnoses     Preventative health care       Relevant Orders   Ambulatory referral to Gastroenterology        No orders of the defined types were placed in this encounter.   I, MDebbrah AlarNP, personally preformed the services described in this documentation.  All medical record entries made by the scribe were at my direction and in my presence.  I have reviewed the chart and discharge instructions (if applicable) and agree that the record reflects my personal performance and is accurate and complete. 06/09/2021   I,Shehryar Baig,acting as a sEducation administratorfor MNance Pear NP.,have documented all relevant documentation on the behalf of MNance Pear NP,as directed by  MNance Pear NP while in the presence of MNance Pear NP.   MNance Pear NP

## 2021-06-09 NOTE — Patient Instructions (Signed)
Please complete lab work prior to leaving.   

## 2021-06-09 NOTE — Assessment & Plan Note (Signed)
It sounds like she was using the 71mg patch and still having cravings. Recommended that she try the 14 mcg nicotine patch instead.

## 2021-06-09 NOTE — Assessment & Plan Note (Signed)
Last LDL was above goal of <70. Will recheck. Continue crestor 40 mg.

## 2021-07-01 ENCOUNTER — Ambulatory Visit (INDEPENDENT_AMBULATORY_CARE_PROVIDER_SITE_OTHER): Payer: Medicare Other

## 2021-07-01 ENCOUNTER — Other Ambulatory Visit: Payer: Self-pay

## 2021-07-01 DIAGNOSIS — D518 Other vitamin B12 deficiency anemias: Secondary | ICD-10-CM | POA: Diagnosis not present

## 2021-07-01 MED ORDER — CYANOCOBALAMIN 1000 MCG/ML IJ SOLN
1000.0000 ug | Freq: Once | INTRAMUSCULAR | Status: AC
  Administered 2021-07-01: 1000 ug via INTRAMUSCULAR

## 2021-07-01 NOTE — Progress Notes (Signed)
Nancy [name] is a 80 y.o. female presents to the office today for monthly b12 injection, per physician's orders. Original order: patient has been getting b12 for several years cyanocobalamin (med), 1,000 mcg (dose),  IM (route) was administered left deltoid (location) today. Patient tolerated injection. Patient due for follow up labs/provider appt: Yes.   Patient next injection due: one month, appt made Yes  [name] [name]  Patients last b12 serum level drawn 10/2019. Please advise when she should come in to have checked.

## 2021-07-24 ENCOUNTER — Ambulatory Visit (INDEPENDENT_AMBULATORY_CARE_PROVIDER_SITE_OTHER): Payer: Medicare Other | Admitting: Pharmacist

## 2021-07-24 ENCOUNTER — Other Ambulatory Visit: Payer: Self-pay | Admitting: Family

## 2021-07-24 DIAGNOSIS — E538 Deficiency of other specified B group vitamins: Secondary | ICD-10-CM

## 2021-07-24 DIAGNOSIS — Z8673 Personal history of transient ischemic attack (TIA), and cerebral infarction without residual deficits: Secondary | ICD-10-CM

## 2021-07-24 DIAGNOSIS — I10 Essential (primary) hypertension: Secondary | ICD-10-CM

## 2021-07-24 DIAGNOSIS — E785 Hyperlipidemia, unspecified: Secondary | ICD-10-CM

## 2021-07-24 NOTE — Patient Instructions (Signed)
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, Sciatica / back pain, Allergic Rhinitis   Hypertension BP Readings from Last 3 Encounters:  06/09/21 (!) 122/51  12/25/20 (!) 142/62  12/10/20 (!) 126/52  Pharmacist Clinical Goal(s): Over the next 90 days, patient will work with PharmD and providers to achieve BP goal <130/80 Current regimen:  Carvedilol '25mg'$  twice daily Clonidine 0.'1mg'$  three times daily  Hydralazine '25mg'$  three times daily Losartan '100mg'$  daily Interventions: Requested patient check her blood pressure 2-3 times per week and record Discussed BP goal Patient self care activities - Over the next 90 days, patient will: Check blood pressure  2-3 times per week, document, and provide at future appointments Ensure daily salt intake < 2300 mg/day Continue current medication regimen for blood pressure   Hyperlipidemia/Hx of Stroke Lab Results  Component Value Date/Time   LDLCALC 135 (H) 06/09/2021 10:46 AM   LDLCALC 85 07/20/2018 02:49 PM  Pharmacist Clinical Goal(s): Over the next 90 days, patient will work with PharmD and providers to achieve LDL goal < 70 Current regimen:  Rosuvastatin '40mg'$  daily Clopidogrel '75mg'$  daily Interventions: Smoking cessation discussed as well as benefits for stroke prevention  Patient self care activities - Over the next 90 days, patient will: Take rosuvastatin daily  Pre-Diabetes Lab Results  Component Value Date/Time   HGBA1C 6.2 06/09/2021 10:46 AM   HGBA1C 5.8 12/10/2020 10:38  AM  Pharmacist Clinical Goal(s): Over the next 90 days, patient will work with PharmD and providers to maintain A1c goal <6.5% Current regimen:  Diet and exercise management   Interventions: Discussed increasing exercise  Patient self care activities - Over the next 90 days, patient will: Patient to identify alternatives to water aerobics (classes suspended due to COVID 19)  Tobacco Use Disorder Pharmacist Clinical Goal(s) Over the next 90 days, patient will work with PharmD and providers to taper number of cigarettes smoked per day until no longer smoking.  Current regimen:  Nicotine replacement patches 73mg daily Interventions: Discussed smoking cessation benefits Provided patient number for [facility] QUIT line.  Patient has purchased Step 2 ('14mg'$ /24hr) Nicotine Patch and nicotin lozenges to use as needed for breakthrough cravings Patient self care activities - Over the next 90 days, patient will: Continue to use nicotine replacement patches - great job! Think of alternative activities to do when experiencing nicotine craving (take a walk, brush teeth, word search)  B12 Deficiency:  Parmacist Clinical Goal(s) Over the next 30 days, patient will work with PharmD and providers to arrange next B12 injection and improve serum B12 to normal levels Current regimen:  B12 injection monthly Interventions: Recommend recheck serum B12 with next labs Patient self care activities - Over the next 90 days, patient will:;  Come in 08/01/2021 for B12 injection Continue with plan to check B12 levels again with next labs  Osteoporosis  Pharmacist Clinical Goal(s) Over the next 90 days, patient will work with PharmD and providers to reduce risk of fracture due to osteoporosis Current regimen:  Prolia '60mg'$  every 6 months (last injection 11/12/2020) Interventions: none Patient  self care activities - Over the next 90 days, patient will: Increase weight bearing exercise Next Prolia injection will be due  around 11/30/2020  Anxiety / Depression Pharmacist Clinical Goal(s): Over the next 90 days, patient will work with PharmD and providers to maintain control of anxiety and depression Current regimen:  None - previously was taking escitalopram (Lexapro) '5mg'$  daily but patient stopped several months ago Interventions: Discussed reasons to take escitalopram and benefits of regular use.  Patient self care activities - Over the next 90 days, patient will: Monitor mood and anxiety level. If any worsening of condition is noted, she will contact primary care provider's office.    Medication management Pharmacist Clinical Goal(s): Over the next 90 days, patient will work with PharmD and providers to achieve optimal medication adherence Current pharmacy: [facility] Interventions Comprehensive medication review performed. Continue current medication management strategy Patient self care activities - Over the next 90 days, patient will: Focus on medication adherence by filling and taking medications appropriately  Take medications as prescribed Report any questions or concerns to PharmD and/or provider(s)  Follow up / revised care plan         The patient verbalized understanding of instructions, educational materials, and care plan provided today and declined offer to receive copy of patient instructions, educational materials, and care plan.   Face to Face appointment with care management team member scheduled for:  08/01/2021  Cherre Robins, PharmD Clinical Pharmacist [facility]

## 2021-07-24 NOTE — Chronic Care Management (AMB) (Signed)
Chronic Care Management Pharmacy Note  07/24/2021 Name:  Nancy [name] MRN:  [id number] DOB:  [birth year]  Summary:  Patient 's LDL increased since "starting" rosuvastatin 54m daily in February 2022. Upon questioning patient have been refilling rosuvastatin but not taking.   Plan: Discussed importance of statin therapy to prevent recurrent stroke and CAD. Patient will start taking rosuvastatin 442mdaily. She will also bring medication 08/01/2021 and we will get her starting on using weekly pill containers to help with adherence.   Subjective: Nancy MOOSEs an [age].o. year old female who is a primary patient of O'Debbrah AlarNP.  The CCM team was consulted for assistance with disease management and care coordination needs.    Engaged with patient by telephone for follow up visit in response to provider referral for pharmacy case management and/or care coordination services.   Consent to Services:  The patient was given information about Chronic Care Management services, agreed to services, and gave verbal consent prior to initiation of services.  Please see initial visit note for detailed documentation.   Patient Care Team: O'Debbrah AlarNP as PCP - General PhHowell RucksRaKarsten RoMD as Consulting Physician (Endocrinology) GaNevada CraneMD as Referring Physician (Rheumatology) DeBo MerinoMD as Consulting Physician (Rheumatology) EcCherre RobinsPharmD (Pharmacist)  Recent office visits: 06/09/2021  - PCP (OInda CastleF/U chronic conditions. Increased nicotine patch to 1427m(was still having cravings with 7mc65match).  05/29/2021 - Office visit for Prolia and b12 injection 02/07/21 - nurse visit for B12 injection  Recent consult visits:  12/25/20 Gastro (Dr. NandSilverio Decampeen for intermittent diarrhea and excessive gas.  X-rays taken. Labs drawn. Per note: doctor advised a lactose free diet and to take Benefiber 1 tablespoon three times a day with meals STARTED  Colestipol 1 g daily.     Hospital visits: None in previous 6 months  Objective:  Lab Results  Component Value Date   CREATININE 0.75 06/09/2021   CREATININE 0.89 12/25/2020   CREATININE 0.85 09/09/2020    Lab Results  Component Value Date   HGBA1C 6.2 06/09/2021   Last diabetic Eye exam: No results found for: HMDIABEYEEXA  Last diabetic Foot exam: No results found for: HMDIABFOOTEX      Component Value Date/Time   CHOL 201 (H) 06/09/2021 1046   CHOL 150 07/20/2018 1449   TRIG 80.0 06/09/2021 1046   HDL 50.00 06/09/2021 1046   HDL 44 07/20/2018 1449   CHOLHDL 4 06/09/2021 1046   VLDL 16.0 06/09/2021 1046   LDLCALC 135 (H) 06/09/2021 1046   LDLCALC 85 07/20/2018 1449    Hepatic Function Latest Ref Rng & Units 12/25/2020 04/10/2020 03/29/2020  Total Protein 6.0 - 8.3 g/dL 7.4 6.6 6.3  Albumin 3.5 - 5.2 g/dL 3.8 3.7 3.5  AST 0 - 37 U/L 13 20 45(H)  ALT 0 - 35 U/L 15 25 49(H)  Alk Phosphatase 39 - 117 U/L 50 75 115  Total Bilirubin 0.2 - 1.2 mg/dL 0.5 0.8 0.8  Bilirubin, Direct 0.0 - 0.3 mg/dL - - 0.2    Lab Results  Component Value Date/Time   TSH 0.78 10/31/2020 09:29 AM   TSH 1.10 02/06/2020 11:37 AM   FREET4 1.17 08/14/2014 09:49 AM   FREET4 1.09 07/25/2014 02:10 PM    CBC Latest Ref Rng & Units 12/25/2020 03/25/2020 08/29/2019  WBC 4.0 - 10.5 K/uL 8.9 6.9 -  Hemoglobin 12.0 - 15.0 g/dL 11.4(L) 11.2(L) 11.9(L)  Hematocrit 36.0 - 46.0 %  34.9(L) 34.6(L) 35.0(L)  Platelets 150.0 - 400.0 K/uL 283.0 234.0 -    No results found for: VD25OH  Clinical ASCVD: Yes  The ASCVD Risk score (Arnett DK, et al., 2019) failed to calculate for the following reasons:   The 2019 ASCVD risk score is only valid for ages 23 to 63   The patient has a prior MI or stroke diagnosis     Social History   Tobacco Use  Smoking Status Some Days   Packs/day: 0.50   Years: 32.00   Pack years: 16.00   Types: Cigarettes  Smokeless Tobacco Never  Tobacco Comments   6 or 7 per day    BP Readings from Last 3 Encounters:  06/09/21 (!) 122/51  12/25/20 (!) 142/62  12/10/20 (!) 126/52   Pulse Readings from Last 3 Encounters:  06/09/21 68  12/25/20 68  12/10/20 60   Wt Readings from Last 3 Encounters:  06/09/21 112 lb (50.8 kg)  12/25/20 114 lb (51.7 kg)  12/10/20 114 lb 9.6 oz (52 kg)    Assessment: Review of patient past medical history, allergies, medications, health status, including review of consultants reports, laboratory and other test data, was performed as part of comprehensive evaluation and provision of chronic care management services.   SDOH:  (Social Determinants of Health) assessments and interventions performed:  SDOH Interventions    Flowsheet Row Most Recent Value  SDOH Interventions   Food Insecurity Interventions Intervention Not Indicated       CCM Care Plan  Allergies  Allergen Reactions   Iron Nausea And Vomiting    Can take infusions    Norvasc [Amlodipine] Other (See Comments)    dizziness    Medications Reviewed Today     Reviewed by Cherre Robins, PharmD (Pharmacist) on 07/24/21 at 20  Med List Status: <None>   Medication Order Taking? Sig Documenting Provider Last Dose Status Informant  acetaminophen (TYLENOL) 500 MG tablet [id number] Yes Take 500 mg by mouth every 6 (six) hours as needed for moderate pain. [provider] Taking Active Self  Adalimumab 40 MG/0.4ML PNKT [id number] Yes Inject 40 mg into the skin once a week. Saturday [provider] Taking Active Self  carvedilol (COREG) 25 MG tablet [id number] Yes TAKE 1 TABLET BY MOUTH TWICE A DAY Debbrah Alar, NP Taking Active   cloNIDine (CATAPRES) 0.1 MG tablet [id number] Yes Take 1 tablet (0.1 mg total) by mouth 3 (three) times daily. Debbrah Alar, NP Taking Active   clopidogrel (PLAVIX) 75 MG tablet [id number] Yes TAKE 1 TABLET BY MOUTH EVERY DAY Debbrah Alar, NP Taking Active   colestipol (COLESTID) 1 g tablet [id number] Yes  TAKE 1 TABLET BY MOUTH EVERY DAY Nandigam, Venia Minks, MD Taking Active   cyanocobalamin ((VITAMIN B-12)) injection 1,000 mcg [id number]   Debbrah Alar, NP  Consider Medication Status and Discontinue (Completed Course)   cyanocobalamin ((VITAMIN B-12)) injection 1,000 mcg [id number]   Debbrah Alar, NP  Consider Medication Status and Discontinue (Completed Course)   cyanocobalamin ((VITAMIN B-12)) injection 1,000 mcg [id number]   Debbrah Alar, NP  Active   escitalopram (LEXAPRO) 5 MG tablet [id number] Yes Take 1 tablet (5 mg total) by mouth daily. Debbrah Alar, NP Taking Active   fluticasone [facility]) 50 MCG/ACT nasal spray [id number] Yes Place 1 spray into both nostrils daily as needed for allergies or rhinitis. [provider] Taking Active Self  hydrALAZINE (APRESOLINE) 25 MG tablet [id number] Yes TAKE 1 TABLET BY MOUTH THREE TIMES A DAY Debbrah Alar, NP  Taking Active   leflunomide (ARAVA) 20 MG tablet [id number] Yes Take 1 tablet (20 mg total) by mouth daily. Debbrah Alar, NP Taking Active   losartan (COZAAR) 100 MG tablet [id number] Yes Take 100 mg by mouth daily. [provider] Taking Active   mycophenolate (CELLCEPT) 500 MG tablet [id number] Yes Take 500 mg by mouth daily. [provider] Taking Active Self  prednisoLONE acetate (PRED FORTE) 1 % ophthalmic suspension [id number] Yes Place 1 drop into both eyes daily.  [provider] Taking Active Self  predniSONE (DELTASONE) 5 MG tablet [id number] Yes Take 5 mg by mouth daily as needed. [provider] Taking Active Self  PROLIA 60 MG/ML SOSY injection [id number] Yes Inject 60 mg as directed every 6 (six) months. [provider] Taking Active Self  rosuvastatin (CRESTOR) 40 MG tablet [id number] No TAKE 1 TABLET BY MOUTH EVERY DAY  Patient not taking: Reported on 07/24/2021   Debbrah Alar, NP Not Taking Active             Patient Active Problem List    Diagnosis Date Noted   Abnormal CT of the chest 09/12/2019   Dyspnea 09/12/2019   Tendinitis of right rotator cuff 02/24/2018   History of CVA (cerebrovascular accident) 03/16/2017   Hematuria 03/16/2017   Acute ischemic right middle cerebral artery (MCA) stroke (HCC) 03/03/2017   Subacromial bursitis of left shoulder joint 08/07/2016   Drug therapy 01/07/2016   Generalized anxiety disorder 01/07/2016   Faintness 12/09/2015   Loss of weight 10/15/2015   Anemia, iron deficiency 09/30/2015   Hyperglycemia 06/07/2015   Near syncope 05/15/2015   Expressive aphasia 12/16/2014   TIA (transient ischemic attack) 12/16/2014   Pain in joint, ankle and foot 09/06/2014   Constipation 09/06/2014   Rheumatoid arthritis ([place]) 07/25/2014   Sciatica 01/31/2014   Hypothyroidism 01/12/2012   Back pain 01/11/2012   Edema 06/15/2011   ANEMIA, B12 DEFICIENCY 11/19/2010   Anxiety state 10/14/2010   MEMORY LOSS 10/14/2010   Hyperlipidemia 12/17/2009   Iron deficiency anemia 12/17/2009   TOBACCO ABUSE 12/17/2009   Depression 12/17/2009   Essential hypertension 12/17/2009   ALLERGIC RHINITIS 12/17/2009   ARTHRITIS, RHEUMATOID 12/17/2009   OSTEOPOROSIS 12/17/2009   URINARY INCONTINENCE 12/17/2009    Immunization History  Administered Date(s) Administered   Fluad Quad(high Dose 65+) 08/01/2019, 08/06/2020   Influenza Split 10/16/2011   Influenza Whole 09/10/2009, 08/18/2010   Influenza, High Dose Seasonal PF 09/10/2015, 08/31/2016, 07/19/2017, 08/03/2018   Influenza,inj,Quad PF,6+ Mos 09/05/2014   Influenza,inj,quad, With Preservative 07/19/2017   Influenza-Unspecified 08/12/2012, 08/09/2013   PFIZER Comirnaty(Gray Top)Covid-19 Tri-Sucrose Vaccine 03/28/2021   PFIZER(Purple Top)SARS-COV-2 Vaccination 12/04/2019, 12/25/2019, 08/16/2020   Pneumococcal Conjugate-13 09/05/2014   Pneumococcal Polysaccharide-23 01/01/2009   Td 12/26/2014    Conditions to be addressed/monitored: CAD, HTN, HLD,  Anxiety, Depression and chronic constipation; tobacco abuse; B12 deficiency; rheumatoid arthritis; osteoporosis  Care Plan : General Pharmacy (Adult)  Updates made by Cherre Robins, PHARMD since 07/24/2021 12:00 AM     Problem: Chronic Disease Management support, education, and care coordination needs related to HTN, Hyperlipidemia, H/o Stroke, Pre-Diabetes, Depression, Tobacco Use Disorder, Osteoporosis, RA, Chorioretinal Inflammation, Sciatica, Allergic Rhinitis   Priority: High  Onset Date: 01/24/2021  Note:   Current Barriers:  Unable to achieve control of tobacco use disorder  Unable to maintain control of hypertension Stopped escitalopram on own because she felt she no longer needed  Pharmacist Clinical Goal(s):  Over the next 90 days, patient will achieve control of tobacco  use disorder as evidenced by cessation of cigarette smoking maintain control of HTN as evidenced by BP <130/80  through collaboration with PharmD and provider.  Monitor depression and anxiety since stopped escitalopram  Interventions: 1:1 collaboration with Debbrah Alar, NP regarding development and update of comprehensive plan of care as evidenced by provider attestation and co-signature Inter-disciplinary care team collaboration (see longitudinal plan of care) Comprehensive medication review performed; medication list updated in electronic medical record  Hypertension: Blood pressure control improving. BP goal <140/90 Current treatment: Carvedilol 54m twice daily Clonidine 0.16mthree times daily  Hydralazine 2535mhree times daily Losartan 100m2mily  Current home readings: SBP ranges from 110 to 142; DBP ranges from 70 to 78 Denies hypotensive/hypertensive symptoms Per refill records and improved blood pressure readings, appears adherence with blood pressure medications has improved.  Interventions:  Requested patient check blood pressure 2 to 3 times per week and record; Discussed blood  pressure goal Continue current therapy for blood pressure    Hyperlipidemia / history of stroke: Uncontrolled; LDL goal < 70 Last LDL was 122 (12/10/2020);  Current therapy:  Rosuvastatin 40mg39mly was started 12/2020 Clopidogrel 75mg 54my for secondary stroke prevention Most recent LDL was surprisingly higher at 135 on 06/09/2021 Reviewed refill history for rosuvastatin - filled 90 day supply on 12/11/2020, 03/08/2021 and 06/09/2021 Patient admits she has filled rosuvastatin but has not been taking regularly. Denies side effects, just states she was focusing on her blood pressure and her daughter who had a stroke recently and didn't take the rosuvastatin Interventions:  Discussed importance of statin therapy in prevention for repeat stroke and heart attacks.  Patient to restart rosuvastatin Recommended start using pill container to improve adherence. Patient to come in for a face to face visit 08/01/21. She is to bring all her medications and pill box so I can assist her in filling and review her medications.  Tobacco Abuse:  Patient report she is using nicotine patches   Current therapy - step 2 or 14mcg 81mtine patches.  Interventions:  Commended her on not smoking - great job.  Reminded her of the benefits of smoking cessation.   B12 Deficiency:  Next B12 injection due 08/01/2021 Last B12 was low at 101 Interventions:  Recommend recheck serum B12 with next labs   Osteoporosis:  Last DEXA from 04/19/2020: AP Spine -1.8 (previously was -1.7 in 2018) Total femur -3.5 (previously was -3.3 in 2018) Left forearm -4.3 (no previous T-Score) Current regimen:  Prolia 60mg SQ2m months Last injection was 05/29/2021 Next due around 11/30/2020  Anxiety / Depression:  Current regimen:  None Past meds: escitalopram 5mg dail47m patient stopped on own because she felt she no longer needed Reports her mood and anxiety have improved since her daughter's health has improved.  Interventions:   None at this time will continue to follow and assess anxiety and depression  Medication management Current [facility] Interventions Comprehensive medication review performed. Continue current medication management strategy Patient self care activities - Over the next 90 days, patient will: Focus on medication adherence by filling and taking medications appropriately  Take medications as prescribed Report any questions or concerns to PharmD and/or provider(s) Bring medications to your appointment on 08/01/2021 and we will review. Will also help her with putting meds in weekly pill box.    Patient Goals/Self-Care Activities Over the next 90 days, patient will:  take medications as prescribed, check blood pressure 2 to 3 times per week, document, and provide at  future appointments, and taking rosuvastatin every day  Follow Up Plan: Face to Face appointment with care management team member scheduled for: 08/01/2021       Medication Assistance: None required.  Patient affirms current coverage meets needs.  Patient's preferred pharmacy [facility]:  [id number] [place] [id number]: [phone]: [phone]  Follow Up:  Patient agrees to Care Plan and Follow-up.  Plan: Face to Face appointment with care management team member scheduled for: 08/01/2021     TCherre Robins PharmD Clinical Pharmacist LQueen AnneMNaplateHThree Rivers Endoscopy Center Inc

## 2021-07-31 ENCOUNTER — Ambulatory Visit: Payer: Medicare Other | Admitting: Gastroenterology

## 2021-08-01 ENCOUNTER — Other Ambulatory Visit: Payer: Self-pay | Admitting: Family

## 2021-08-01 ENCOUNTER — Ambulatory Visit: Payer: Medicare Other | Admitting: Pharmacist

## 2021-08-01 ENCOUNTER — Other Ambulatory Visit: Payer: Self-pay

## 2021-08-01 ENCOUNTER — Ambulatory Visit (INDEPENDENT_AMBULATORY_CARE_PROVIDER_SITE_OTHER): Payer: Medicare Other

## 2021-08-01 VITALS — BP 138/58 | HR 68

## 2021-08-01 DIAGNOSIS — I10 Essential (primary) hypertension: Secondary | ICD-10-CM

## 2021-08-01 DIAGNOSIS — E785 Hyperlipidemia, unspecified: Secondary | ICD-10-CM

## 2021-08-01 DIAGNOSIS — Z79899 Other long term (current) drug therapy: Secondary | ICD-10-CM

## 2021-08-01 DIAGNOSIS — E538 Deficiency of other specified B group vitamins: Secondary | ICD-10-CM | POA: Diagnosis not present

## 2021-08-01 MED ORDER — CYANOCOBALAMIN 1000 MCG/ML IJ SOLN
1000.0000 ug | Freq: Once | INTRAMUSCULAR | Status: AC
  Administered 2021-08-01: 1000 ug via INTRAMUSCULAR

## 2021-08-01 NOTE — Progress Notes (Signed)
Pt is here today for monthly B12 injection. Pt was given B12 in left deltoid. Pt tolerated well

## 2021-08-01 NOTE — Patient Instructions (Signed)
Don't forget to get you annual flu vaccine this season so you are protected. The vaccine is available at [facility] office, downstairs at the [place] or at your preferred local pharmacy.   [place] and local pharmacies are  is also starting to administer the updated bivalent COVID-19 booster. If your last COVID-19 booster was at least 2 months ago, make sure to get the bivalent COVID-19 booster.

## 2021-08-01 NOTE — Chronic Care Management (AMB) (Signed)
Chronic Care Management Pharmacy Note  08/01/2021 Name:  Nancy [name] MRN:  [id number] DOB:  05/13/1941  Summary:  Reviewed each medication, reason for taking and directions for taking. Unfortunately patient forgot her medications so I could not fully review and fill medication container. I did provide a medication container for her. Discussed getting COVID bivalent booster and influenza vaccine   Plan:   Follow up in 1 month to assess adherence and review medications.  Patient considering getting COVID bivalent today at [facility].  Plans to get flu vaccine in October.   Subjective: Nancy [name] is an 80 y.o. year old female who is a primary patient of Debbrah Alar, NP.  The CCM team was consulted for assistance with disease management and care coordination needs.    Engaged with patient face to face for follow up visit in response to provider referral for pharmacy case management and/or care coordination services.   Consent to Services:  The patient was given information about Chronic Care Management services, agreed to services, and gave verbal consent prior to initiation of services.  Please see initial visit note for detailed documentation.   Patient Care Team: Debbrah Alar, NP as PCP - General Howell Rucks, Karsten Ro, MD as Consulting Physician (Endocrinology) [place] Crane, MD as Referring Physician (Rheumatology) Bo Merino, MD as Consulting Physician (Rheumatology) Cherre Robins, PharmD (Pharmacist)  Recent office visits: 06/09/2021  - PCP [facility] [place]) F/U chronic conditions. Increased nicotine patch to 58mg (was still having cravings with 751m patch).  05/29/2021 - Office visit for Prolia and b12 injection 02/07/21 - nurse visit for B12 injection  Recent consult visits:  12/25/20 Gastro (Dr. NaSilverio Decamp Seen for intermittent diarrhea and excessive gas.  X-rays taken. Labs drawn. Per note: doctor advised a lactose free diet and to take  Benefiber 1 tablespoon three times a day with meals STARTED Colestipol 1 g daily.     Hospital visits: None in previous 6 months  Objective:  Lab Results  Component Value Date   CREATININE 0.75 06/09/2021   CREATININE 0.89 12/25/2020   CREATININE 0.85 09/09/2020    Lab Results  Component Value Date   HGBA1C 6.2 06/09/2021   Last diabetic Eye exam: No results found for: HMDIABEYEEXA  Last diabetic Foot exam: No results found for: HMDIABFOOTEX      Component Value Date/Time   CHOL 201 (H) 06/09/2021 1046   CHOL 150 07/20/2018 1449   TRIG 80.0 06/09/2021 1046   HDL 50.00 06/09/2021 1046   HDL 44 07/20/2018 1449   CHOLHDL 4 06/09/2021 1046   VLDL 16.0 06/09/2021 1046   LDLCALC 135 (H) 06/09/2021 1046   LDLCALC 85 07/20/2018 1449    Hepatic Function Latest Ref Rng & Units 12/25/2020 04/10/2020 03/29/2020  Total Protein 6.0 - 8.3 g/dL 7.4 6.6 6.3  Albumin 3.5 - 5.2 g/dL 3.8 3.7 3.5  AST 0 - 37 U/L 13 20 45(H)  ALT 0 - 35 U/L 15 25 49(H)  Alk Phosphatase 39 - 117 U/L 50 75 115  Total Bilirubin 0.2 - 1.2 mg/dL 0.5 0.8 0.8  Bilirubin, Direct 0.0 - 0.3 mg/dL - - 0.2    Lab Results  Component Value Date/Time   TSH 0.78 10/31/2020 09:29 AM   TSH 1.10 02/06/2020 11:37 AM   FREET4 1.17 08/14/2014 09:49 AM   FREET4 1.09 07/25/2014 02:10 PM    CBC Latest Ref Rng & Units 12/25/2020 03/25/2020 08/29/2019  WBC 4.0 - 10.5 K/uL 8.9 6.9 -  Hemoglobin 12.0 - 15.0 g/dL 11.4(L) 11.2(L) 11.9(L)  Hematocrit 36.0 - 46.0 % 34.9(L) 34.6(L) 35.0(L)  Platelets 150.0 - 400.0 K/uL 283.0 234.0 -    No results found for: VD25OH  Clinical ASCVD: Yes  The ASCVD Risk score (Arnett DK, et al., 2019) failed to calculate for the following reasons:   The 2019 ASCVD risk score is only valid for ages 63 to 69   The patient has a prior MI or stroke diagnosis     Social History   Tobacco Use  Smoking Status Some Days   Packs/day: 0.50   Years: 32.00   Pack years: 16.00   Types: Cigarettes   Smokeless Tobacco Never  Tobacco Comments   6 or 7 per day   BP Readings from Last 3 Encounters:  08/01/21 (!) 138/58  06/09/21 (!) 122/51  12/25/20 (!) 142/62   Pulse Readings from Last 3 Encounters:  08/01/21 68  06/09/21 68  12/25/20 68   Wt Readings from Last 3 Encounters:  06/09/21 112 lb (50.8 kg)  12/25/20 114 lb (51.7 kg)  12/10/20 114 lb 9.6 oz (52 kg)    Assessment: Review of patient past medical history, allergies, medications, health status, including review of consultants reports, laboratory and other test data, was performed as part of comprehensive evaluation and provision of chronic care management services.   SDOH:  (Social Determinants of Health) assessments and interventions performed:     CCM Care Plan  Allergies  Allergen Reactions   Iron Nausea And Vomiting    Can take infusions    Norvasc [Amlodipine] Other (See Comments)    dizziness    Medications Reviewed Today     Reviewed by Cherre Robins, PharmD (Pharmacist) on 08/01/21 at [phone]  Med List Status: <None>   Medication Order Taking? Sig Documenting Provider Last Dose Status Informant  acetaminophen (TYLENOL) 500 MG tablet [id number] Yes Take 500 mg by mouth every 6 (six) hours as needed for moderate pain. [provider] Taking Active Self  Adalimumab 40 MG/0.4ML PNKT [id number] Yes Inject 40 mg into the skin once a week. Saturday [provider] Taking Active Self  carvedilol (COREG) 25 MG tablet [id number] Yes TAKE 1 TABLET BY MOUTH TWICE A DAY Debbrah Alar, NP Taking Active   cloNIDine (CATAPRES) 0.1 MG tablet [id number] Yes TAKE 1 TABLET BY MOUTH 3 TIMES DAILY. Debbrah Alar, NP Taking Active   clopidogrel (PLAVIX) 75 MG tablet [id number] Yes TAKE 1 TABLET BY MOUTH EVERY DAY Debbrah Alar, NP Taking Active   colestipol (COLESTID) 1 g tablet [id number] Yes TAKE 1 TABLET BY MOUTH EVERY DAY Nandigam, Venia Minks, MD Taking Active   cyanocobalamin ((VITAMIN B-12))  injection 1,000 mcg [id number]   Debbrah Alar, NP  Active   cyanocobalamin ((VITAMIN B-12)) injection 1,000 mcg [id number]   Debbrah Alar, NP  Active   cyanocobalamin ((VITAMIN B-12)) injection 1,000 mcg [id number]   Debbrah Alar, NP  Active   escitalopram (LEXAPRO) 5 MG tablet [id number] No Take 1 tablet (5 mg total) by mouth daily.  Patient not taking: Reported on 08/01/2021   Debbrah Alar, NP Not Taking Active   fluticasone [facility]) 50 MCG/ACT nasal spray [id number] Yes Place 1 spray into both nostrils daily as needed for allergies or rhinitis. [provider] Taking Active Self  hydrALAZINE (APRESOLINE) 25 MG tablet [id number] Yes TAKE 1 TABLET BY MOUTH THREE TIMES A Charlton Haws, NP Taking Active   leflunomide (ARAVA) 20 MG tablet [id number] Yes Take 1 tablet (20 mg  total) by mouth daily. Debbrah Alar, NP Taking Active   losartan (COZAAR) 100 MG tablet [id number] Yes TAKE 1 TABLET BY MOUTH EVERY DAY Debbrah Alar, NP Taking Active   mycophenolate (CELLCEPT) 500 MG tablet [id number] Yes Take 500 mg by mouth daily. [provider] Taking Active Self  prednisoLONE acetate (PRED FORTE) 1 % ophthalmic suspension [id number] Yes Place 1 drop into both eyes daily.  [provider] Taking Active Self  predniSONE (DELTASONE) 5 MG tablet [id number] Yes Take 5 mg by mouth daily as needed. [provider] Taking Active Self  PROLIA 60 MG/ML SOSY injection [id number] Yes Inject 60 mg as directed every 6 (six) months. [provider] Taking Active Self  rosuvastatin (CRESTOR) 40 MG tablet [id number] No TAKE 1 TABLET BY MOUTH EVERY DAY Debbrah Alar, NP Unknown Active             Patient Active Problem List   Diagnosis Date Noted   Abnormal CT of the chest 09/12/2019   Dyspnea 09/12/2019   Tendinitis of right rotator cuff 02/24/2018   History of CVA (cerebrovascular accident) 03/16/2017   Hematuria 03/16/2017    Acute ischemic right middle cerebral artery (MCA) stroke (HCC) 03/03/2017   Subacromial bursitis of left shoulder joint 08/07/2016   Drug therapy 01/07/2016   Generalized anxiety disorder 01/07/2016   Faintness 12/09/2015   Loss of weight 10/15/2015   Anemia, iron deficiency 09/30/2015   Hyperglycemia 06/07/2015   Near syncope 05/15/2015   Expressive aphasia 12/16/2014   TIA (transient ischemic attack) 12/16/2014   Pain in joint, ankle and foot 09/06/2014   Constipation 09/06/2014   Rheumatoid arthritis ([place]) 07/25/2014   Sciatica 01/31/2014   Hypothyroidism 01/12/2012   Back pain 01/11/2012   Edema 06/15/2011   ANEMIA, B12 DEFICIENCY 11/19/2010   Anxiety state 10/14/2010   MEMORY LOSS 10/14/2010   Hyperlipidemia 12/17/2009   Iron deficiency anemia 12/17/2009   TOBACCO ABUSE 12/17/2009   Depression 12/17/2009   Essential hypertension 12/17/2009   ALLERGIC RHINITIS 12/17/2009   ARTHRITIS, RHEUMATOID 12/17/2009   OSTEOPOROSIS 12/17/2009   URINARY INCONTINENCE 12/17/2009    Immunization History  Administered Date(s) Administered   Fluad Quad(high Dose 65+) 08/01/2019, 08/06/2020   Influenza Split 10/16/2011   Influenza Whole 09/10/2009, 08/18/2010   Influenza, High Dose Seasonal PF 09/10/2015, 08/31/2016, 07/19/2017, 08/03/2018   Influenza,inj,Quad PF,6+ Mos 09/05/2014   Influenza,inj,quad, With Preservative 07/19/2017   Influenza-Unspecified 08/12/2012, 08/09/2013   PFIZER Comirnaty(Gray Top)Covid-19 Tri-Sucrose Vaccine 03/28/2021   PFIZER(Purple Top)SARS-COV-2 Vaccination 12/04/2019, 12/25/2019, 08/16/2020   Pneumococcal Conjugate-13 09/05/2014   Pneumococcal Polysaccharide-23 01/01/2009   Td 12/26/2014    Conditions to be addressed/monitored: CAD, HTN, HLD, Anxiety, Depression and chronic constipation; tobacco abuse; B12 deficiency; rheumatoid arthritis; osteoporosis  Care Plan : General Pharmacy (Adult)  Updates made by Cherre Robins, PHARMD since 08/01/2021  12:00 AM     Problem: Chronic Disease Management support, education, and care coordination needs related to HTN, Hyperlipidemia, H/o Stroke, Pre-Diabetes, Depression, Tobacco Use Disorder, Osteoporosis, RA, Chorioretinal Inflammation, Sciatica, Allergic Rhinitis   Priority: High  Onset Date: 01/24/2021  Note:   Current Barriers:  Unable to achieve control of tobacco use disorder  Unable to maintain control of hypertension Stopped escitalopram on own because she felt she no longer needed  Pharmacist Clinical Goal(s):  Over the next 90 days, patient will achieve control of tobacco use disorder as evidenced by cessation of cigarette smoking maintain control of HTN as evidenced by BP <130/80  through collaboration with PharmD and provider.  Monitor depression and anxiety since stopped escitalopram  Interventions: 1:1 collaboration with Debbrah Alar, NP regarding development and update of comprehensive plan of care as evidenced by provider attestation and co-signature Inter-disciplinary care team collaboration (see longitudinal plan of care) Comprehensive medication review performed; medication list updated in electronic medical record  Hypertension: Blood pressure control improving. BP goal <140/90 Blood pressure in office today was at goal Current treatment: Carvedilol 48m twice daily Clonidine 0.172mthree times daily  Hydralazine 2533mhree times daily Losartan 100m75mily  Current home readings: SBP ranges from 110 to 142; DBP ranges from 70 to 78 Denies hypotensive/hypertensive symptoms Per refill records and improved blood pressure readings, appears adherence with blood pressure medications has improved.  Interventions:  Requested patient check blood pressure 2 to 3 times per week and record; Discussed blood pressure goal Continue current therapy for blood pressure    Hyperlipidemia / history of stroke: Uncontrolled; LDL goal < 70 Last LDL was 122 (12/10/2020);  Current  therapy:  Rosuvastatin 40mg48mly was started 12/2020 Clopidogrel 75mg 72my for secondary stroke prevention Most recent LDL was surprisingly higher at 135 on 06/09/2021 Reviewed refill history for rosuvastatin - filled 90 day supply on 12/11/2020, 03/08/2021 and 06/09/2021 Patient admits she has filled rosuvastatin but has not been taking regularly. Denies side effects, just states she was focusing on her blood pressure and her daughter who had a stroke recently and didn't take the rosuvastatin Interventions:  Discussed importance of statin therapy in prevention for repeat stroke and heart attacks.  Patient to restart rosuvastatin Recommended start using pill container to improve adherence. Unfortunately patient did not bring medications today but I did provide weekly medication container   Tobacco Abuse:  Patient report she is using nicotine patches   Current therapy - step 2 or 14mcg 11mtine patches.  Interventions:  Commended her on not smoking - great job.  Reminded her of the benefits of smoking cessation.   B12 Deficiency:  Next B12 injection due 08/2021 Last B12 was low at 101 Interventions:  Recommend recheck serum B12 with next labs   Osteoporosis:  Last DEXA from 04/19/2020: AP Spine -1.8 (previously was -1.7 in 2018) Total femur -3.5 (previously was -3.3 in 2018) Left forearm -4.3 (no previous T-Score) Current regimen:  Prolia 60mg SQ77m months Last injection was 05/29/2021 Next due around 11/30/2021  Anxiety / Depression:  Current regimen:  None Past meds: escitalopram 5mg dail62m patient stopped on own because she felt she no longer needed Reports her mood and anxiety have improved since her daughter's health has improved.  Interventions:  None at this time will continue to follow and assess anxiety and depression  Medication management [facility] Interventions Comprehensive medication review performed. Continue current medication management  strategy Reviewed each medication and reason for taking. Provided med list with indicaiton for each medicaiton.  Provided weekly medication container to patient.  Patient self care activities - Over the next 90 days, patient will: Focus on medication adherence by filling and taking medications appropriately  Take medications as prescribed Report any questions or concerns to PharmD and/or provider(s)   Patient Goals/Self-Care Activities Over the next 90 days, patient will:  take medications as prescribed, check blood pressure 2 to 3 times per week, document, and provide at future appointments, and taking rosuvastatin every day  Follow Up Plan: Face to Face appointment with care management team member scheduled for: 1 month       Medication Assistance: None required.  Patient affirms current coverage meets needs.  Patient's preferred pharmacy is:  [id number] [place] [id number]: [phone]: [phone]  Follow Up:  Patient agrees to Care Plan and Follow-up.  Plan: Face to Face appointment with care management team member scheduled for: 1 month     TCherre Robins PharmD Clinical Pharmacist [facility]

## 2021-08-08 DIAGNOSIS — I10 Essential (primary) hypertension: Secondary | ICD-10-CM | POA: Diagnosis not present

## 2021-08-08 DIAGNOSIS — E785 Hyperlipidemia, unspecified: Secondary | ICD-10-CM

## 2021-08-29 DIAGNOSIS — H26491 Other secondary cataract, right eye: Secondary | ICD-10-CM | POA: Diagnosis not present

## 2021-08-29 DIAGNOSIS — H40013 Open angle with borderline findings, low risk, bilateral: Secondary | ICD-10-CM | POA: Diagnosis not present

## 2021-08-29 DIAGNOSIS — H15052 Scleromalacia perforans, left eye: Secondary | ICD-10-CM | POA: Diagnosis not present

## 2021-08-29 DIAGNOSIS — H43813 Vitreous degeneration, bilateral: Secondary | ICD-10-CM | POA: Diagnosis not present

## 2021-08-29 DIAGNOSIS — H31092 Other chorioretinal scars, left eye: Secondary | ICD-10-CM | POA: Diagnosis not present

## 2021-08-29 DIAGNOSIS — Z961 Presence of intraocular lens: Secondary | ICD-10-CM | POA: Diagnosis not present

## 2021-09-02 ENCOUNTER — Ambulatory Visit: Payer: Medicare Other

## 2021-09-02 DIAGNOSIS — M170 Bilateral primary osteoarthritis of knee: Secondary | ICD-10-CM | POA: Diagnosis not present

## 2021-09-02 DIAGNOSIS — Z681 Body mass index (BMI) 19 or less, adult: Secondary | ICD-10-CM | POA: Diagnosis not present

## 2021-09-02 DIAGNOSIS — H15042 Scleritis with corneal involvement, left eye: Secondary | ICD-10-CM | POA: Diagnosis not present

## 2021-09-02 DIAGNOSIS — M0579 Rheumatoid arthritis with rheumatoid factor of multiple sites without organ or systems involvement: Secondary | ICD-10-CM | POA: Diagnosis not present

## 2021-09-02 DIAGNOSIS — R5383 Other fatigue: Secondary | ICD-10-CM | POA: Diagnosis not present

## 2021-09-02 DIAGNOSIS — Z79899 Other long term (current) drug therapy: Secondary | ICD-10-CM | POA: Diagnosis not present

## 2021-09-03 ENCOUNTER — Other Ambulatory Visit: Payer: Self-pay

## 2021-09-03 ENCOUNTER — Ambulatory Visit (INDEPENDENT_AMBULATORY_CARE_PROVIDER_SITE_OTHER): Payer: Medicare Other

## 2021-09-03 DIAGNOSIS — E538 Deficiency of other specified B group vitamins: Secondary | ICD-10-CM | POA: Diagnosis not present

## 2021-09-03 NOTE — Progress Notes (Signed)
Pt here for monthly B12 injection per Melissa  B12 1041mcg given  left IM, and pt tolerated injection well.  Next B12 injection scheduled for 10/01/21 at 10:00 am

## 2021-09-04 ENCOUNTER — Ambulatory Visit: Payer: Medicare Other | Admitting: Gastroenterology

## 2021-09-05 ENCOUNTER — Other Ambulatory Visit: Payer: Self-pay | Admitting: Family

## 2021-09-24 ENCOUNTER — Encounter: Payer: Self-pay | Admitting: Gastroenterology

## 2021-09-24 ENCOUNTER — Ambulatory Visit (INDEPENDENT_AMBULATORY_CARE_PROVIDER_SITE_OTHER): Payer: Medicare Other | Admitting: Gastroenterology

## 2021-09-24 VITALS — BP 110/78 | HR 77 | Ht 63.0 in | Wt 118.2 lb

## 2021-09-24 DIAGNOSIS — K5900 Constipation, unspecified: Secondary | ICD-10-CM

## 2021-09-24 MED ORDER — POLYETHYLENE GLYCOL 3350 17 GM/SCOOP PO POWD: 17.0000 g | Freq: Every day | ORAL | 11 refills | Status: AC

## 2021-09-24 NOTE — Patient Instructions (Addendum)
Start Miralax 1 capful daily in 8 ounces of liquid.  Call or send mychart message in 3-4 with an update.   If you are age 80 or older, your body mass index should be between 23-30. Your Body mass index is 20.94 kg/m. If this is out of the aforementioned range listed, please consider follow up with your Primary Care Provider.  If you are age 49 or younger, your body mass index should be between 19-25. Your Body mass index is 20.94 kg/m. If this is out of the aformentioned range listed, please consider follow up with your Primary Care Provider.   ________________________________________________________  The [place] [facility] providers would like to encourage you to use [facility] to communicate with providers for non-urgent requests or questions.  Due to long hold times on the telephone, sending your provider a message by [facility] may be a faster and more efficient way to get a response.  Please allow 48 business hours for a response.  Please remember that this is for non-urgent requests.  _______________________________________________________

## 2021-09-24 NOTE — Progress Notes (Signed)
09/24/2021 Nancy [name] [id number] 06-25-1941   HISTORY OF PRESENT ILLNESS: This is an 80 year old female who is a patient of Dr. Woodward Ku.  She is here today for complaints of constipation.  This is somewhat new for her as previously she had issues with diarrhea, but she learned to manage that with dietary changes such as eliminating dairy, etc.  This has just become an issue recently.  She says that it was to the point that she was just passing small balls of stool.  She is taking Metamucil, but says that that is not helping.  She denies any rectal bleeding.  No significant abdominal pain.  She says that her appetite is good.  There has been no major changes in her medications.  TSH was normal in 10/2020.  CT abdomen pelvis Mar 27, 2020: No acute abnormality.  Cluster of small bowel in mid abdomen with minimal overlying in mid mesentery, possible internal hernia.  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   Cataract    Colon polyps    CVA (cerebral vascular accident) ([place]) 02/2017   Depression    Hyperlipidemia    Hypertension    Osteoporosis 01/01/2015   Rheumatoid arteritis (HCC)    Thyroid disease    TIA (transient ischemic attack) 2016   Urinary incontinence    Past Surgical History:  Procedure Laterality Date   ABDOMINAL HYSTERECTOMY  1976   CATARACT EXTRACTION Bilateral    LOWER EXTREMITY ANGIOGRAPHY N/A 08/29/2019   Procedure: LOWER EXTREMITY ANGIOGRAPHY;  Surgeon: Serafina Mitchell, MD;  Location: [place] CV LAB;  Service: Cardiovascular;  Laterality: N/A;   PERIPHERAL VASCULAR INTERVENTION Right 08/29/2019   Procedure: PERIPHERAL VASCULAR INTERVENTION;  Surgeon: Serafina Mitchell, MD;  Location: [place] [facility];  Service: Cardiovascular;  Laterality: Right;   TUMOR  REMOVAL  last was 1976   left ankle x3    reports that she has been smoking cigarettes. She has a 16.00 pack-year smoking history. She has never used smokeless tobacco. She reports that she does not drink alcohol and does not use drugs. family history includes Alcohol abuse in her brother; Arthritis in her sister and another family member; Cirrhosis in her brother; Cirrhosis (age of onset: 64) in her brother; Colitis in her daughter; Colon cancer in her father; Colon polyps in her daughter; Coronary artery disease in an other family member; Heart attack in her daughter; Heart disease (age of onset: 66) in her sister; Hyperlipidemia in an other family member; Hypertension in her father, sister, son, and another family member; Kidney disease in her son; Parkinson's disease in her mother; Stroke in her sister; Thyroid disease in her daughter; Ulcerative colitis in her mother. Allergies  Allergen Reactions   Iron Nausea And Vomiting    Can take infusions    Norvasc [Amlodipine] Other (See Comments)    dizziness      Outpatient Encounter Medications as of 09/24/2021  Medication Sig   acetaminophen (TYLENOL) 500 MG tablet Take 500 mg by mouth every 6 (six) hours as needed for moderate pain.   Adalimumab 40 MG/0.4ML PNKT Inject 40 mg into the skin once a week. Saturday   carvedilol (COREG) 25 MG tablet TAKE 1 TABLET BY MOUTH TWICE A DAY  cloNIDine (CATAPRES) 0.1 MG tablet TAKE 1 TABLET BY MOUTH 3 TIMES DAILY.   clopidogrel (PLAVIX) 75 MG tablet TAKE 1 TABLET BY MOUTH EVERY DAY   colestipol (COLESTID) 1 g tablet TAKE 1 TABLET BY MOUTH EVERY DAY   escitalopram (LEXAPRO) 5 MG tablet Take 1 tablet (5 mg total) by mouth daily.   fluticasone (FLONASE) 50 MCG/ACT nasal spray Place 1 spray into both nostrils daily as needed for allergies or rhinitis.   hydrALAZINE (APRESOLINE) 25 MG tablet TAKE 1 TABLET BY MOUTH THREE TIMES A DAY   leflunomide (ARAVA) 20 MG tablet Take 1 tablet (20 mg total) by mouth  daily.   losartan (COZAAR) 100 MG tablet TAKE 1 TABLET BY MOUTH EVERY DAY   mycophenolate (CELLCEPT) 500 MG tablet Take 500 mg by mouth daily.   prednisoLONE acetate (PRED FORTE) 1 % ophthalmic suspension Place 1 drop into both eyes daily.    predniSONE (DELTASONE) 5 MG tablet Take 5 mg by mouth daily as needed.   PROLIA 60 MG/ML SOSY injection Inject 60 mg as directed every 6 (six) months.   rosuvastatin (CRESTOR) 40 MG tablet TAKE 1 TABLET BY MOUTH EVERY DAY   Facility-Administered Encounter Medications as of 09/24/2021  Medication   cyanocobalamin ((VITAMIN B-12)) injection 1,000 mcg   cyanocobalamin ((VITAMIN B-12)) injection 1,000 mcg    REVIEW OF SYSTEMS  : All other systems reviewed and negative except where noted in the History of Present Illness.   PHYSICAL EXAM: BP 110/78   Pulse 77   Ht 5\' 3"  (1.6 m)   Wt 118 lb 3.2 oz (53.6 kg)   SpO2 99%   BMI 20.94 kg/m  General: Well developed AA female in no acute distress Head: Normocephalic and atraumatic Eyes:  Sclerae anicteric, conjunctiva pink. Ears: Normal auditory acuity Lungs: Clear throughout to auscultation; no W/R/G. Heart: Regular rate and rhythm; no M/R/G. Abdomen: Soft, non-distended.  BS present.  Mild diffuse TTP. Musculoskeletal: Symmetrical with no gross deformities  Skin: No lesions on visible extremities Extremities: No edema  Neurological: Alert oriented x 4, grossly non-focal Psychological:  Alert and cooperative. Normal mood and affect  ASSESSMENT AND PLAN: *Constipation: This is somewhat of a change for her as previously she had issues with diarrhea or loose stools, which she has learned to manage with eliminating dairy, etc.  She is using Metamucil, which may be creating too much bulk if she has not increased her fluid intake with that.  I have asked her to discontinue the Metamucil and begin taking MiraLAX daily.  She will call us or message [place] back in 3 to 4 weeks with an update on her symptoms.  She  is on no new medications that should be causing this.  TSH 10/2020 was ok.  If this continues to be an issue despite some bowel regimens then could consider repeat colonoscopy, although it is very likely a motility issue rather than an obstructive/structural process.   CC:  Debbrah Alar, NP

## 2021-10-01 ENCOUNTER — Ambulatory Visit (INDEPENDENT_AMBULATORY_CARE_PROVIDER_SITE_OTHER): Payer: Medicare Other | Admitting: *Deleted

## 2021-10-01 ENCOUNTER — Other Ambulatory Visit: Payer: Self-pay

## 2021-10-01 DIAGNOSIS — E538 Deficiency of other specified B group vitamins: Secondary | ICD-10-CM

## 2021-10-01 MED ORDER — CYANOCOBALAMIN 1000 MCG/ML IJ SOLN
1000.0000 ug | Freq: Once | INTRAMUSCULAR | Status: AC
  Administered 2021-10-01: 1000 ug via INTRAMUSCULAR

## 2021-10-01 NOTE — Progress Notes (Signed)
Patient here for monthly b12 injection.  Injection given in left deltoid and patient tolerated well.  Advised that b12 level may need to be check at next visit 10/10/21.  Last b12 in chart was 10/29/19.   Patient scheduled for next b12 on 10/31/21.

## 2021-10-10 ENCOUNTER — Telehealth: Payer: Self-pay | Admitting: Family

## 2021-10-10 ENCOUNTER — Ambulatory Visit (INDEPENDENT_AMBULATORY_CARE_PROVIDER_SITE_OTHER): Payer: Medicare Other | Admitting: Family

## 2021-10-10 VITALS — BP 134/80 | HR 68 | Temp 98.7°F | Resp 18 | Ht 63.0 in | Wt 118.2 lb

## 2021-10-10 DIAGNOSIS — E785 Hyperlipidemia, unspecified: Secondary | ICD-10-CM

## 2021-10-10 DIAGNOSIS — J40 Bronchitis, not specified as acute or chronic: Secondary | ICD-10-CM | POA: Diagnosis not present

## 2021-10-10 DIAGNOSIS — J209 Acute bronchitis, unspecified: Secondary | ICD-10-CM

## 2021-10-10 DIAGNOSIS — F32A Depression, unspecified: Secondary | ICD-10-CM

## 2021-10-10 DIAGNOSIS — M059 Rheumatoid arthritis with rheumatoid factor, unspecified: Secondary | ICD-10-CM | POA: Diagnosis not present

## 2021-10-10 DIAGNOSIS — G8929 Other chronic pain: Secondary | ICD-10-CM

## 2021-10-10 DIAGNOSIS — R1032 Left lower quadrant pain: Secondary | ICD-10-CM | POA: Diagnosis not present

## 2021-10-10 DIAGNOSIS — E538 Deficiency of other specified B group vitamins: Secondary | ICD-10-CM | POA: Diagnosis not present

## 2021-10-10 DIAGNOSIS — E039 Hypothyroidism, unspecified: Secondary | ICD-10-CM

## 2021-10-10 DIAGNOSIS — I10 Essential (primary) hypertension: Secondary | ICD-10-CM | POA: Diagnosis not present

## 2021-10-10 LAB — COMPREHENSIVE METABOLIC PANEL
ALT: 9 U/L (ref 0–35)
AST: 12 U/L (ref 0–37)
Albumin: 3.6 g/dL (ref 3.5–5.2)
Alkaline Phosphatase: 44 U/L (ref 39–117)
BUN: 16 mg/dL (ref 6–23)
CO2: 26 mEq/L (ref 19–32)
Calcium: 9.5 mg/dL (ref 8.4–10.5)
Chloride: 105 mEq/L (ref 96–112)
Creatinine, Ser: 0.94 mg/dL (ref 0.40–1.20)
GFR: 57.29 mL/min — ABNORMAL LOW (ref >60.00)
Glucose, Bld: 94 mg/dL (ref 70–99)
Potassium: 4.5 mEq/L (ref 3.5–5.1)
Sodium: 138 mEq/L (ref 135–145)
Total Bilirubin: 0.6 mg/dL (ref 0.2–1.2)
Total Protein: 6.7 g/dL (ref 6.0–8.3)

## 2021-10-10 LAB — LIPID PANEL
Cholesterol: 202 mg/dL — ABNORMAL HIGH (ref 0–200)
HDL: 45.1 mg/dL (ref >39.00)
LDL Cholesterol: 139 mg/dL — ABNORMAL HIGH (ref 0–99)
NonHDL: 156.79
Total CHOL/HDL Ratio: 4
Triglycerides: 87 mg/dL (ref 0.0–149.0)
VLDL: 17.4 mg/dL (ref 0.0–40.0)

## 2021-10-10 LAB — VITAMIN B12: Vitamin B-12: 511 pg/mL (ref 211–911)

## 2021-10-10 MED ORDER — ALBUTEROL SULFATE HFA 108 (90 BASE) MCG/ACT IN AERS: 2.0000 | INHALATION_SPRAY | Freq: Four times a day (QID) | RESPIRATORY_TRACT | 0 refills | Status: AC | PRN

## 2021-10-10 MED ORDER — AZITHROMYCIN 250 MG PO TABS: ORAL_TABLET | ORAL | 0 refills | Status: AC

## 2021-10-10 NOTE — Assessment & Plan Note (Signed)
BP Readings from Last 3 Encounters:  10/10/21 134/80  09/24/21 110/78  08/01/21 (!) 138/58   Stable on carvedilol, clonidine, hydalazine, losartan.  Continue same.

## 2021-10-10 NOTE — Assessment & Plan Note (Signed)
She is on crestor 20 mg. LDL goal is <70 given hx of CVA. Check lipid panel.

## 2021-10-10 NOTE — Assessment & Plan Note (Signed)
Stable off of lexapro, monitor.

## 2021-10-10 NOTE — Patient Instructions (Addendum)
Begin azithromycin for bronchitis. You may use albuterol inhaler 2 puffs every 6 hrs as needed for wheezing. For chest congestion you may use mucinex (over the counter) as needed.

## 2021-10-10 NOTE — Telephone Encounter (Signed)
Jermiyah had her last injection on 05/29/2021 Next prolia due 6 months 11/30/2021 or after. Clinical info submitted to insurance company. Waiting on summary of benefits to determine coverage. Will call patient to discuss once received.

## 2021-10-10 NOTE — Assessment & Plan Note (Addendum)
Lab Results  Component Value Date   TSH 0.78 10/31/2020   Previous hx of hypothyroid with normal follow TSH.

## 2021-10-10 NOTE — Assessment & Plan Note (Signed)
New. I suspect musculoskeletal etiology. Recommended tylenol prn and let us know if symptoms worsen.

## 2021-10-10 NOTE — Progress Notes (Signed)
Subjective:   By signing my name below, I, Zite Okoli, attest that this documentation has been prepared under the direction and in the presence of Debbrah Alar, NP  10/10/2021    [name], female    DOB: 13-May-1941, 80 y.o.   MRN: [id number]  Chief Complaint  Patient presents with   Hyperlipidemia   Follow-up    Hyperlipidemia Pertinent negatives include no chest pain, myalgias or shortness of breath.  Patient is in today for an office visit.  Groin Pain- She reports that for the past month, she hs been having pain in the left groin. She denies associated back pain.  Anxiety/Depression- She reports that her moods have been good. She has stopped taking lexapro and is doing well.  Blood pressure- She manages her blood pressure with losartan, hydralazine, clonidine and carvedilol and is doing well on it. Her blood pressure is stable at this visit. BP Readings from Last 3 Encounters:  10/10/21 134/80  09/24/21 110/78  08/01/21 (!) 138/58    Sputum Production- She reports that she has been coughing and producing sputum for the past 4 months. She feels like it is coming from her chest.   Rheumatoid Arthritis- She reports she is still having pain and stiffness in her fingers. She is still following up with her rheumatologist, the pain has not increased and she has her next appointment in January 2023.   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   Cataract    Colon polyps    CVA (cerebral vascular accident) ([place]) 02/2017   Depression    Hyperlipidemia    Hypertension    Osteoporosis 01/01/2015   Rheumatoid arteritis (HCC)    Thyroid disease    TIA (transient ischemic attack) 2016   Urinary incontinence     Past Surgical History:  Procedure Laterality Date   ABDOMINAL HYSTERECTOMY  1976   CATARACT EXTRACTION Bilateral    LOWER EXTREMITY ANGIOGRAPHY N/A 08/29/2019   Procedure: LOWER  EXTREMITY ANGIOGRAPHY;  Surgeon: Serafina Mitchell, MD;  Location: [facility];  Service: Cardiovascular;  Laterality: N/A;   PERIPHERAL VASCULAR INTERVENTION Right 08/29/2019   Procedure: PERIPHERAL VASCULAR INTERVENTION;  Surgeon: Serafina Mitchell, MD;  Location: [facility];  Service: Cardiovascular;  Laterality: Right;   TUMOR REMOVAL  last was 1976   left ankle x3    Family History  Problem Relation Age of Onset   Hypertension Father    Colon cancer Father    Ulcerative colitis Mother    Parkinson's disease Mother    Heart disease Sister 15       CABG x 3   Cirrhosis Brother    Alcohol abuse Brother    Colitis Daughter    Colon polyps Daughter    Heart attack Daughter    Hypertension Son    Kidney disease Son        transplant due to kidney problems after [place]   Arthritis Other    Coronary artery disease Other    Hyperlipidemia Other    Hypertension Other    Stroke Sister        died 28   Arthritis Sister    Hypertension Sister    Thyroid disease Daughter        x 3   Cirrhosis Brother 56       ETOH abuse  Social History   Socioeconomic History   Marital status: Divorced    Spouse name: Not on file   Number of children: 8   Years of education: Not on file   Highest education level: Not on file  Occupational History   Occupation: retired    Fish farm manager: RETIRED    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  Tobacco Use   Smoking status: Some Days    Packs/day: 0.50    Years: 32.00    Pack years: 16.00    Types: Cigarettes   Smokeless tobacco: Never   Tobacco comments:    6 or 7 per day  Vaping Use   Vaping Use: Never used  Substance and Sexual Activity   Alcohol use: No    Alcohol/week: 0.0 standard drinks   Drug use: No   Sexual activity: Never  Other Topics Concern   Not on file  Social History Narrative   Lives alone   Right Handed    Drinks 6-8 cups caffeine daily   Social Determinants of Health   Financial Resource  Strain: Low Risk    Difficulty of Paying Living Expenses: Not hard at all  Food Insecurity: No Food Insecurity   Worried About Charity fundraiser in the Last Year: Never true   [place] in the Last Year: Never true  Transportation Needs: No Transportation Needs   Lack of Transportation (Medical): No   Lack of Transportation (Non-Medical): No  Physical Activity: Insufficiently Active   Days of Exercise per Week: 2 days   Minutes of Exercise per Session: 30 min  Stress: No Stress Concern Present   Feeling of Stress : Only a little  Social Connections: Moderately Isolated   Frequency of Communication with Friends and Family: More than three times a week   Frequency of Social Gatherings with Friends and Family: More than three times a week   Attends Religious Services: More than 4 times per year   Active Member of [employer] or Organizations: No   Attends Archivist Meetings: Never   Marital Status: Divorced  Human resources officer Violence: Not At Risk   Fear of Current or Ex-Partner: No   Emotionally Abused: No   Physically Abused: No   Sexually Abused: No    Outpatient Medications Prior to Visit  Medication Sig Dispense Refill   acetaminophen (TYLENOL) 500 MG tablet Take 500 mg by mouth every 6 (six) hours as needed for moderate pain.     Adalimumab 40 MG/0.4ML PNKT Inject 40 mg into the skin once a week. Saturday     carvedilol (COREG) 25 MG tablet TAKE 1 TABLET BY MOUTH TWICE A DAY 180 tablet 1   cloNIDine (CATAPRES) 0.1 MG tablet TAKE 1 TABLET BY MOUTH 3 TIMES DAILY. 270 tablet 1   clopidogrel (PLAVIX) 75 MG tablet TAKE 1 TABLET BY MOUTH EVERY DAY 90 tablet 1   colestipol (COLESTID) 1 g tablet TAKE 1 TABLET BY MOUTH EVERY DAY 90 tablet 1   fluticasone (FLONASE) 50 MCG/ACT nasal spray Place 1 spray into both nostrils daily as needed for allergies or rhinitis.     hydrALAZINE (APRESOLINE) 25 MG tablet TAKE 1 TABLET BY MOUTH THREE TIMES A DAY 270 tablet 1   leflunomide  (ARAVA) 20 MG tablet Take 1 tablet (20 mg total) by mouth daily.     losartan (COZAAR) 100 MG tablet TAKE 1 TABLET BY MOUTH EVERY DAY 90 tablet 1   mycophenolate (CELLCEPT) 500 MG tablet Take 500 mg  by mouth daily.     polyethylene glycol powder (GLYCOLAX/MIRALAX) 17 GM/SCOOP powder Take 17 g by mouth daily. 119 g 11   prednisoLONE acetate (PRED FORTE) 1 % ophthalmic suspension Place 1 drop into both eyes daily.      predniSONE (DELTASONE) 5 MG tablet Take 5 mg by mouth daily as needed.     PROLIA 60 MG/ML SOSY injection Inject 60 mg as directed every 6 (six) months.     rosuvastatin (CRESTOR) 40 MG tablet TAKE 1 TABLET BY MOUTH EVERY DAY 90 tablet 1   escitalopram (LEXAPRO) 5 MG tablet Take 1 tablet (5 mg total) by mouth daily. 90 tablet 1   No facility-administered medications prior to visit.    Allergies  Allergen Reactions   Iron Nausea And Vomiting    Can take infusions    Norvasc [Amlodipine] Other (See Comments)    dizziness    Review of Systems  Constitutional:  Negative for fever.  HENT:  Negative for ear pain and hearing loss.        (-)nystagmus (-)adenopathy  Eyes:  Negative for blurred vision.  Respiratory:  Positive for cough and sputum production. Negative for shortness of breath and wheezing.   Cardiovascular:  Negative for chest pain and leg swelling.  Gastrointestinal:  Positive for abdominal pain (left side). Negative for blood in stool, diarrhea, nausea and vomiting.  Genitourinary:  Negative for dysuria and frequency.  Musculoskeletal:  Negative for joint pain and myalgias.       (+) pelvic pain   Skin:  Negative for rash.  Neurological:  Negative for headaches.  Psychiatric/Behavioral:  Negative for depression. The patient is not nervous/anxious.       Objective:    Physical Exam Constitutional:      General: She is not in acute distress.    Appearance: Normal appearance. She is not ill-appearing.  HENT:     Head: Normocephalic and atraumatic.      Right Ear: External ear normal.     Left Ear: External ear normal.  Eyes:     Extraocular Movements: Extraocular movements intact.     Pupils: Pupils are equal, round, and reactive to light.  Cardiovascular:     Rate and Rhythm: Normal rate and regular rhythm.     Pulses: Normal pulses.     Heart sounds: Normal heart sounds. No murmur heard. Pulmonary:     Effort: Pulmonary effort is normal. No respiratory distress.     Breath sounds: Examination of the right-lower field reveals wheezing. Wheezing present. No rhonchi.     Comments: Right expiratory wheeze in right lower lobe  Abdominal:     General: Bowel sounds are normal. There is no distension.     Palpations: Abdomen is soft.     Tenderness: There is no abdominal tenderness. There is no guarding or rebound.  Musculoskeletal:     Cervical back: Neck supple.     Comments: No tenderness right groin, no hernia of lymphadenopathy in this area.   Lymphadenopathy:     Cervical: No cervical adenopathy.  Skin:    General: Skin is warm and dry.  Neurological:     Mental Status: She is alert and oriented to person, place, and time.  Psychiatric:        Behavior: Behavior normal.        Judgment: Judgment normal.    BP 134/80 (BP Location: Left Arm, Patient Position: Sitting, Cuff Size: Normal)   Pulse 68   Temp 98.7 F (  37.1 C) (Oral)   Resp 18   Ht 5' 3"  (1.6 m)   Wt 118 lb 3.2 oz (53.6 kg)   SpO2 98%   BMI 20.94 kg/m  Wt Readings from Last 3 Encounters:  10/10/21 118 lb 3.2 oz (53.6 kg)  09/24/21 118 lb 3.2 oz (53.6 kg)  06/09/21 112 lb (50.8 kg)    Diabetic Foot Exam - Simple   No data filed    Lab Results  Component Value Date   WBC 8.9 12/25/2020   HGB 11.4 (L) 12/25/2020   HCT 34.9 (L) 12/25/2020   PLT 283.0 12/25/2020   GLUCOSE 95 06/09/2021   CHOL 201 (H) 06/09/2021   TRIG 80.0 06/09/2021   HDL 50.00 06/09/2021   LDLCALC 135 (H) 06/09/2021   ALT 15 12/25/2020   AST 13 12/25/2020   NA 139 06/09/2021    K 4.8 06/09/2021   CL 105 06/09/2021   CREATININE 0.75 06/09/2021   BUN 15 06/09/2021   CO2 27 06/09/2021   TSH 0.78 10/31/2020   INR 1.1 06/06/2019   HGBA1C 6.2 06/09/2021    Lab Results  Component Value Date   TSH 0.78 10/31/2020   Lab Results  Component Value Date   WBC 8.9 12/25/2020   HGB 11.4 (L) 12/25/2020   HCT 34.9 (L) 12/25/2020   MCV 86.2 12/25/2020   PLT 283.0 12/25/2020   Lab Results  Component Value Date   NA 139 06/09/2021   K 4.8 06/09/2021   CHLORIDE 108 09/24/2015   CO2 27 06/09/2021   GLUCOSE 95 06/09/2021   BUN 15 06/09/2021   CREATININE 0.75 06/09/2021   BILITOT 0.5 12/25/2020   ALKPHOS 50 12/25/2020   AST 13 12/25/2020   ALT 15 12/25/2020   PROT 7.4 12/25/2020   ALBUMIN 3.8 12/25/2020   CALCIUM 8.8 06/09/2021   ANIONGAP 10 06/06/2019   EGFR 89 (L) 09/24/2015   GFR 75.30 06/09/2021   Lab Results  Component Value Date   CHOL 201 (H) 06/09/2021   Lab Results  Component Value Date   HDL 50.00 06/09/2021   Lab Results  Component Value Date   LDLCALC 135 (H) 06/09/2021   Lab Results  Component Value Date   TRIG 80.0 06/09/2021   Lab Results  Component Value Date   CHOLHDL 4 06/09/2021   Lab Results  Component Value Date   HGBA1C 6.2 06/09/2021       Assessment & Plan:   Problem List Items Addressed This Visit       Unprioritized   Rheumatoid arthritis ([place])    Reports fair control. Management per rheumatology.       Hypothyroidism    Lab Results  Component Value Date   TSH 0.78 10/31/2020  Previous hx of hypothyroid with normal follow TSH.       Hyperlipidemia - Primary    She is on crestor 20 mg. LDL goal is <70 given hx of CVA. Check lipid panel.       Relevant Orders   Lipid panel   Groin pain, chronic, left    New. I suspect musculoskeletal etiology. Recommended tylenol prn and let us know if symptoms worsen.       Essential hypertension    BP Readings from Last 3 Encounters:  10/10/21 134/80   09/24/21 110/78  08/01/21 (!) 138/58  Stable on carvedilol, clonidine, hydalazine, losartan.  Continue same.       Relevant Orders   Comp Met (CMET)   Depression  Stable off of lexapro, monitor.       B12 deficiency    Clinically stable on monthly injections. Check follow up b12 level.       Relevant Orders   B12   Acute bronchitis    New. Will rx with azithromycin and prn albuterol.       Other Visit Diagnoses     Bronchitis          Meds ordered this encounter  Medications   azithromycin (ZITHROMAX) 250 MG tablet    Sig: Take 2 tablets on day 1, then 1 tablet daily on days 2 through 5    Dispense:  6 tablet    Refill:  0    Order Specific Question:   Supervising Provider    Answer:   Penni Homans A [[id number]]   albuterol (VENTOLIN HFA) 108 (90 Base) MCG/ACT inhaler    Sig: Inhale 2 puffs into the lungs every 6 (six) hours as needed for wheezing or shortness of breath.    Dispense:  8 g    Refill:  0    Order Specific Question:   Supervising Provider    Answer:   Penni Homans A [[id number]]     I,Zite Okoli,acting as a scribe for [name], NP.,have documented all relevant documentation on the behalf of [name], NP,as directed by  [name], NP while in the presence of [name], NP.   I, Debbrah Alar, NP , personally preformed the services described in this documentation.  All medical record entries made by the scribe were at my direction and in my presence.  I have reviewed the chart and discharge instructions (if applicable) and agree that the record reflects my personal performance and is accurate and complete. 10/10/2021

## 2021-10-10 NOTE — Assessment & Plan Note (Signed)
Reports fair control. Management per rheumatology.

## 2021-10-10 NOTE — Progress Notes (Signed)
Subjective:   By signing my name below, I, Zite Okoli, attest that this documentation has been prepared under the direction and in the presence of Debbrah Alar, NP  10/10/2021    [name], female    DOB: 1941-04-08, 80 y.o.   MRN: [id number]  Chief Complaint  Patient presents with   Hyperlipidemia   Follow-up    HPI Patient is in today for an office visit.  B12-  Past Medical History:  Diagnosis Date   Allergy    Anemia    iron deficiency   Cancer ([place]) 1995   vaginal   Cataract    Colon polyps    CVA (cerebral vascular accident) ([place]) 02/2017   Depression    Hyperlipidemia    Hypertension    Osteoporosis 01/01/2015   Rheumatoid arteritis (HCC)    Thyroid disease    TIA (transient ischemic attack) 2016   Urinary incontinence     Past Surgical History:  Procedure Laterality Date   ABDOMINAL HYSTERECTOMY  1976   CATARACT EXTRACTION Bilateral    LOWER EXTREMITY ANGIOGRAPHY N/A 08/29/2019   Procedure: LOWER EXTREMITY ANGIOGRAPHY;  Surgeon: Serafina Mitchell, MD;  Location: [facility];  Service: Cardiovascular;  Laterality: N/A;   PERIPHERAL VASCULAR INTERVENTION Right 08/29/2019   Procedure: PERIPHERAL VASCULAR INTERVENTION;  Surgeon: Serafina Mitchell, MD;  Location: [facility];  Service: Cardiovascular;  Laterality: Right;   TUMOR REMOVAL  last was 1976   left ankle x3    Family History  Problem Relation Age of Onset   Hypertension Father    Colon cancer Father    Ulcerative colitis Mother    Parkinson's disease Mother    Heart disease Sister 23       CABG x 3   Cirrhosis Brother    Alcohol abuse Brother    Colitis Daughter    Colon polyps Daughter    Heart attack Daughter    Hypertension Son    Kidney disease Son        transplant due to kidney problems after [place]   Arthritis Other    Coronary artery disease Other    Hyperlipidemia Other    Hypertension Other    Stroke Sister        died 41    Arthritis Sister    Hypertension Sister    Thyroid disease Daughter        x 3   Cirrhosis Brother 1       ETOH abuse    Social History   Socioeconomic History   Marital status: Divorced    Spouse name: Not on file   Number of children: 8   Years of education: Not on file   Highest education level: Not on file  Occupational History   Occupation: retired    Fish farm manager: RETIRED    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  Tobacco Use   Smoking status: Some Days    Packs/day: 0.50    Years: 32.00    Pack years: 16.00    Types: Cigarettes   Smokeless tobacco: Never   Tobacco comments:    6 or 7 per day  Vaping Use   Vaping Use: Never used  Substance and Sexual Activity   Alcohol use: No    Alcohol/week: 0.0 standard drinks   Drug use: No   Sexual activity: Never  Other Topics Concern   Not on file  Social History Narrative   Lives alone  Right Handed    Drinks 6-8 cups caffeine daily   Social Determinants of Health   Financial Resource Strain: Low Risk    Difficulty of Paying Living Expenses: Not hard at all  Food Insecurity: No Food Insecurity   Worried About Charity fundraiser in the Last Year: Never true   Arboriculturist in the Last Year: Never true  Transportation Needs: No Transportation Needs   Lack of Transportation (Medical): No   Lack of Transportation (Non-Medical): No  Physical Activity: Insufficiently Active   Days of Exercise per Week: 2 days   Minutes of Exercise per Session: 30 min  Stress: No Stress Concern Present   Feeling of Stress : Only a little  Social Connections: Moderately Isolated   Frequency of Communication with Friends and Family: More than three times a week   Frequency of Social Gatherings with Friends and Family: More than three times a week   Attends Religious Services: More than 4 times per year   Active Member of [employer] or Organizations: No   Attends Archivist Meetings: Never   Marital Status: Divorced  Arboriculturist Violence: Not At Risk   Fear of Current or Ex-Partner: No   Emotionally Abused: No   Physically Abused: No   Sexually Abused: No    Outpatient Medications Prior to Visit  Medication Sig Dispense Refill   acetaminophen (TYLENOL) 500 MG tablet Take 500 mg by mouth every 6 (six) hours as needed for moderate pain.     Adalimumab 40 MG/0.4ML PNKT Inject 40 mg into the skin once a week. Saturday     carvedilol (COREG) 25 MG tablet TAKE 1 TABLET BY MOUTH TWICE A DAY 180 tablet 1   cloNIDine (CATAPRES) 0.1 MG tablet TAKE 1 TABLET BY MOUTH 3 TIMES DAILY. 270 tablet 1   clopidogrel (PLAVIX) 75 MG tablet TAKE 1 TABLET BY MOUTH EVERY DAY 90 tablet 1   colestipol (COLESTID) 1 g tablet TAKE 1 TABLET BY MOUTH EVERY DAY 90 tablet 1   escitalopram (LEXAPRO) 5 MG tablet Take 1 tablet (5 mg total) by mouth daily. 90 tablet 1   fluticasone (FLONASE) 50 MCG/ACT nasal spray Place 1 spray into both nostrils daily as needed for allergies or rhinitis.     hydrALAZINE (APRESOLINE) 25 MG tablet TAKE 1 TABLET BY MOUTH THREE TIMES A DAY 270 tablet 1   leflunomide (ARAVA) 20 MG tablet Take 1 tablet (20 mg total) by mouth daily.     losartan (COZAAR) 100 MG tablet TAKE 1 TABLET BY MOUTH EVERY DAY 90 tablet 1   mycophenolate (CELLCEPT) 500 MG tablet Take 500 mg by mouth daily.     polyethylene glycol powder (GLYCOLAX/MIRALAX) 17 GM/SCOOP powder Take 17 g by mouth daily. 119 g 11   prednisoLONE acetate (PRED FORTE) 1 % ophthalmic suspension Place 1 drop into both eyes daily.      predniSONE (DELTASONE) 5 MG tablet Take 5 mg by mouth daily as needed.     PROLIA 60 MG/ML SOSY injection Inject 60 mg as directed every 6 (six) months.     rosuvastatin (CRESTOR) 40 MG tablet TAKE 1 TABLET BY MOUTH EVERY DAY 90 tablet 1   No facility-administered medications prior to visit.    Allergies  Allergen Reactions   Iron Nausea And Vomiting    Can take infusions    Norvasc [Amlodipine] Other (See Comments)    dizziness     Review of Systems  Constitutional:  Negative  for fever.  HENT:  Negative for ear pain and hearing loss.        (-)nystagmus (-)adenopathy  Eyes:  Negative for blurred vision.  Respiratory:  Negative for cough, shortness of breath and wheezing.   Cardiovascular:  Negative for chest pain and leg swelling.  Gastrointestinal:  Negative for blood in stool, diarrhea, nausea and vomiting.  Genitourinary:  Negative for dysuria and frequency.  Musculoskeletal:  Negative for joint pain and myalgias.  Skin:  Negative for rash.  Neurological:  Negative for headaches.  Psychiatric/Behavioral:  Negative for depression. The patient is not nervous/anxious.       Objective:    Physical Exam Constitutional:      General: She is not in acute distress.    Appearance: Normal appearance. She is not ill-appearing.  HENT:     Head: Normocephalic and atraumatic.     Right Ear: External ear normal.     Left Ear: External ear normal.  Eyes:     Extraocular Movements: Extraocular movements intact.     Pupils: Pupils are equal, round, and reactive to light.  Cardiovascular:     Rate and Rhythm: Normal rate and regular rhythm.     Pulses: Normal pulses.     Heart sounds: Normal heart sounds. No murmur heard. Pulmonary:     Effort: Pulmonary effort is normal. No respiratory distress.     Breath sounds: Normal breath sounds. No wheezing or rhonchi.  Abdominal:     General: Bowel sounds are normal. There is no distension.     Palpations: Abdomen is soft.     Tenderness: There is no abdominal tenderness. There is no guarding or rebound.  Musculoskeletal:     Cervical back: Neck supple.  Lymphadenopathy:     Cervical: No cervical adenopathy.  Skin:    General: Skin is warm and dry.  Neurological:     Mental Status: She is alert and oriented to person, place, and time.  Psychiatric:        Behavior: Behavior normal.        Judgment: Judgment normal.    BP 134/80 (BP Location: Left Arm, Patient  Position: Sitting, Cuff Size: Normal)   Pulse 68   Temp 98.7 F (37.1 C) (Oral)   Resp 18   Ht 5' 3"  (1.6 m)   Wt 118 lb 3.2 oz (53.6 kg)   SpO2 98%   BMI 20.94 kg/m  Wt Readings from Last 3 Encounters:  10/10/21 118 lb 3.2 oz (53.6 kg)  09/24/21 118 lb 3.2 oz (53.6 kg)  06/09/21 112 lb (50.8 kg)    Diabetic Foot Exam - Simple   No data filed    Lab Results  Component Value Date   WBC 8.9 12/25/2020   HGB 11.4 (L) 12/25/2020   HCT 34.9 (L) 12/25/2020   PLT 283.0 12/25/2020   GLUCOSE 95 06/09/2021   CHOL 201 (H) 06/09/2021   TRIG 80.0 06/09/2021   HDL 50.00 06/09/2021   LDLCALC 135 (H) 06/09/2021   ALT 15 12/25/2020   AST 13 12/25/2020   NA 139 06/09/2021   K 4.8 06/09/2021   CL 105 06/09/2021   CREATININE 0.75 06/09/2021   BUN 15 06/09/2021   CO2 27 06/09/2021   TSH 0.78 10/31/2020   INR 1.1 06/06/2019   HGBA1C 6.2 06/09/2021    Lab Results  Component Value Date   TSH 0.78 10/31/2020   Lab Results  Component Value Date   WBC 8.9 12/25/2020   HGB  11.4 (L) 12/25/2020   HCT 34.9 (L) 12/25/2020   MCV 86.2 12/25/2020   PLT 283.0 12/25/2020   Lab Results  Component Value Date   NA 139 06/09/2021   K 4.8 06/09/2021   CHLORIDE 108 09/24/2015   CO2 27 06/09/2021   GLUCOSE 95 06/09/2021   BUN 15 06/09/2021   CREATININE 0.75 06/09/2021   BILITOT 0.5 12/25/2020   ALKPHOS 50 12/25/2020   AST 13 12/25/2020   ALT 15 12/25/2020   PROT 7.4 12/25/2020   ALBUMIN 3.8 12/25/2020   CALCIUM 8.8 06/09/2021   ANIONGAP 10 06/06/2019   EGFR 89 (L) 09/24/2015   GFR 75.30 06/09/2021   Lab Results  Component Value Date   CHOL 201 (H) 06/09/2021   Lab Results  Component Value Date   HDL 50.00 06/09/2021   Lab Results  Component Value Date   LDLCALC 135 (H) 06/09/2021   Lab Results  Component Value Date   TRIG 80.0 06/09/2021   Lab Results  Component Value Date   CHOLHDL 4 06/09/2021   Lab Results  Component Value Date   HGBA1C 6.2 06/09/2021        Assessment & Plan:   Problem List Items Addressed This Visit   None  No orders of the defined types were placed in this encounter.   I,Zite Okoli,acting as a Education administrator for [employer], NP.,have documented all relevant documentation on the behalf of [name], NP,as directed by  [name], NP while in the presence of [name], NP.   I, Debbrah Alar, NP , personally preformed the services described in this documentation.  All medical record entries made by the scribe were at my direction and in my presence.  I have reviewed the chart and discharge instructions (if applicable) and agree that the record reflects my personal performance and is accurate and complete. 10/10/2021

## 2021-10-10 NOTE — Telephone Encounter (Signed)
Please advise pt that her cholesterol is still above goal. I would like her to continue crestor 40mg  once daily and refer her to the lipid clinic for further med management.  B12 level is improved, continue monthly injections.

## 2021-10-10 NOTE — Telephone Encounter (Signed)
Can you please check the date of her last prolia shot and if it is do, work on Ship broker please?

## 2021-10-10 NOTE — Assessment & Plan Note (Signed)
New. Will rx with azithromycin and prn albuterol.

## 2021-10-10 NOTE — Assessment & Plan Note (Signed)
Clinically stable on monthly injections. Check follow up b12 level.

## 2021-10-13 NOTE — Telephone Encounter (Signed)
Per patient she has not been taking the crestor daily as prescrived "sometimes don't take if for a whole week". Patient will like for provider to be informed "she will start taking daily now"

## 2021-10-31 ENCOUNTER — Ambulatory Visit (INDEPENDENT_AMBULATORY_CARE_PROVIDER_SITE_OTHER): Payer: Medicare Other | Admitting: *Deleted

## 2021-10-31 DIAGNOSIS — E538 Deficiency of other specified B group vitamins: Secondary | ICD-10-CM

## 2021-10-31 MED ORDER — CYANOCOBALAMIN 1000 MCG/ML IJ SOLN
1000.0000 ug | Freq: Once | INTRAMUSCULAR | Status: AC
  Administered 2021-10-31: 10:00:00 1000 ug via INTRAMUSCULAR

## 2021-10-31 NOTE — Progress Notes (Signed)
Patient here for monthly b12 injection per physician orders.   Injection given in right deltoid and patient tolerated well.   Patient scheduled for next b12 on 12/02/21.

## 2021-11-07 ENCOUNTER — Ambulatory Visit (INDEPENDENT_AMBULATORY_CARE_PROVIDER_SITE_OTHER): Payer: Medicare Other

## 2021-11-07 VITALS — Ht 63.0 in | Wt 115.0 lb

## 2021-11-07 DIAGNOSIS — Z0000 Encounter for general adult medical examination without abnormal findings: Secondary | ICD-10-CM | POA: Diagnosis not present

## 2021-11-07 NOTE — Progress Notes (Signed)
Subjective:   Nancy [name] is a 80 y.o. female who presents for Medicare Annual (Subsequent) preventive examination.  I connected with Kang today by telephone and verified that I am speaking with the correct person using two identifiers. Location patient: home Location provider: work Persons participating in the virtual visit: patient, Marine scientist.    I discussed the limitations, risks, security and privacy concerns of performing an evaluation and management service by telephone and the availability of in person appointments. I also discussed with the patient that there may be a patient responsible charge related to this service. The patient expressed understanding and verbally consented to this telephonic visit.    Interactive audio and video telecommunications were attempted between this provider and patient, however failed, due to patient having technical difficulties OR patient did not have access to video capability.  We continued and completed visit with audio only.  Some vital signs may be absent or patient reported.   Time Spent with patient on telephone encounter: 25 minutes   Review of Systems     Cardiac Risk Factors include: advanced age (>20men, >5 women);hypertension;dyslipidemia;smoking/ tobacco exposure     Objective:    Today's Vitals   11/07/21 0941  Weight: 115 lb (52.2 kg)  Height: 5\' 3"  (1.6 m)  PainSc: 7    Body mass index is 20.37 kg/m.  Advanced Directives 11/07/2021 10/17/2020 10/09/2019 08/29/2019 07/31/2019 07/25/2019 06/06/2019  Does Patient Have a Medical Advance Directive? No No Yes Yes Yes Yes No  Type of Advance Directive - Public librarian;Living will [place];Living will [place];Living will [place];Living will -  Does patient want to make changes to medical advance directive? - Yes (MAU/Ambulatory/Procedural Areas - Information given) No - Patient declined No - Patient  declined No - Patient declined No - Patient declined -  Copy of [place] in Chart? - - - No - copy requested - No - copy requested -  Would patient like information on creating a medical advance directive? No - Patient declined Yes (MAU/Ambulatory/Procedural Areas - Information given) - - - - -    Current Medications (verified) Outpatient Encounter Medications as of 11/07/2021  Medication Sig   acetaminophen (TYLENOL) 500 MG tablet Take 500 mg by mouth every 6 (six) hours as needed for moderate pain.   Adalimumab 40 MG/0.4ML PNKT Inject 40 mg into the skin once a week. Saturday   albuterol (VENTOLIN HFA) 108 (90 Base) MCG/ACT inhaler Inhale 2 puffs into the lungs every 6 (six) hours as needed for wheezing or shortness of breath.   carvedilol (COREG) 25 MG tablet TAKE 1 TABLET BY MOUTH TWICE A DAY   cloNIDine (CATAPRES) 0.1 MG tablet TAKE 1 TABLET BY MOUTH 3 TIMES DAILY.   clopidogrel (PLAVIX) 75 MG tablet TAKE 1 TABLET BY MOUTH EVERY DAY   colestipol (COLESTID) 1 g tablet TAKE 1 TABLET BY MOUTH EVERY DAY   fluticasone (FLONASE) 50 MCG/ACT nasal spray Place 1 spray into both nostrils daily as needed for allergies or rhinitis.   hydrALAZINE (APRESOLINE) 25 MG tablet TAKE 1 TABLET BY MOUTH THREE TIMES A DAY   leflunomide (ARAVA) 20 MG tablet Take 1 tablet (20 mg total) by mouth daily.   losartan (COZAAR) 100 MG tablet TAKE 1 TABLET BY MOUTH EVERY DAY   mycophenolate (CELLCEPT) 500 MG tablet Take 500 mg by mouth daily.   polyethylene glycol powder (GLYCOLAX/MIRALAX) 17 GM/SCOOP powder Take 17 g by  mouth daily.   prednisoLONE acetate (PRED FORTE) 1 % ophthalmic suspension Place 1 drop into both eyes daily.    predniSONE (DELTASONE) 5 MG tablet Take 5 mg by mouth daily as needed.   PROLIA 60 MG/ML SOSY injection Inject 60 mg as directed every 6 (six) months.   rosuvastatin (CRESTOR) 40 MG tablet TAKE 1 TABLET BY MOUTH EVERY DAY   No facility-administered encounter medications  on file as of 11/07/2021.    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[facility])    Thyroid disease    TIA (transient ischemic attack) 2016   Urinary incontinence    Past Surgical History:  Procedure Laterality Date   ABDOMINAL HYSTERECTOMY  1976   CATARACT EXTRACTION Bilateral    LOWER EXTREMITY ANGIOGRAPHY N/A 08/29/2019   Procedure: LOWER EXTREMITY ANGIOGRAPHY;  Surgeon: Serafina Mitchell, MD;  Location: [facility];  Service: Cardiovascular;  Laterality: N/A;   PERIPHERAL VASCULAR INTERVENTION Right 08/29/2019   Procedure: PERIPHERAL VASCULAR INTERVENTION;  Surgeon: Serafina Mitchell, MD;  Location: [facility];  Service: Cardiovascular;  Laterality: Right;   TUMOR REMOVAL  last was 1976   left ankle x3   Family History  Problem Relation Age of Onset   Hypertension Father    Colon cancer Father    Ulcerative colitis Mother    Parkinson's disease Mother    Heart disease Sister 46       CABG x 3   Cirrhosis Brother    Alcohol abuse Brother    Colitis Daughter    Colon polyps Daughter    Heart attack Daughter    Hypertension Son    Kidney disease Son        transplant due to kidney problems after [place] Storm   Arthritis Other    Coronary artery disease Other    Hyperlipidemia Other    Hypertension Other    Stroke Sister        died 44   Arthritis Sister    Hypertension Sister    Thyroid disease Daughter        x 3   Cirrhosis Brother 40       ETOH abuse   Social History   Socioeconomic History   Marital status: Divorced    Spouse name: Not on file   Number of children: 8   Years of education: Not on file   Highest education level: Not on  file  Occupational History   Occupation: retired    Fish farm manager: RETIRED    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  Tobacco Use   Smoking status: Some Days    Packs/day: 0.50    Years: 32.00    Pack years: 16.00    Types: Cigarettes   Smokeless tobacco: Never   Tobacco comments:    6 or 7 per day  Vaping Use   Vaping Use: Never used  Substance and Sexual Activity   Alcohol use: No    Alcohol/week: 0.0 standard drinks   Drug use: No   Sexual activity: Never  Other Topics Concern   Not on file  Social History Narrative  Lives alone   Right Handed    Drinks 6-8 cups caffeine daily   Social Determinants of Health   Financial Resource Strain: Low Risk    Difficulty of Paying Living Expenses: Not hard at all  Food Insecurity: No Food Insecurity   Worried About Charity fundraiser in the Last Year: Never true   Arboriculturist in the Last Year: Never true  Transportation Needs: No Transportation Needs   Lack of Transportation (Medical): No   Lack of Transportation (Non-Medical): No  Physical Activity: Inactive   Days of Exercise per Week: 0 days   Minutes of Exercise per Session: 0 min  Stress: No Stress Concern Present   Feeling of Stress : Not at all  Social Connections: Moderately Isolated   Frequency of Communication with Friends and Family: More than three times a week   Frequency of Social Gatherings with Friends and Family: More than three times a week   Attends Religious Services: More than 4 times per year   Active Member of [employer] or Organizations: No   Attends Music therapist: Never   Marital Status: Divorced    Tobacco Counseling Ready to quit: Yes Counseling given: Not Answered Tobacco comments: 6 or 7 per day   Clinical Intake:  Pre-visit preparation completed: Yes  Pain : 0-10 Pain Score: 7  Pain Type: Chronic pain Pain Location: Back (and feet) Pain Onset: More than a month ago Pain Frequency: Constant     BMI - recorded:  20.37 Nutritional Status: BMI of 19-24  Normal Nutritional Risks: None Diabetes: No  How often do you need to have someone help you when you read instructions, pamphlets, or other written materials from your doctor or pharmacy?: 1 - Never  Diabetic?No  Interpreter Needed?: No  Information entered by :: Caroleen Hamman LPN   Activities of Daily Living In your present state of health, do you have any difficulty performing the following activities: 11/07/2021  Hearing? N  Vision? N  Difficulty concentrating or making decisions? Y  Comment sees neurology  Walking or climbing stairs? N  Dressing or bathing? N  Doing errands, shopping? N  Preparing Food and eating ? N  Using the Toilet? N  In the past six months, have you accidently leaked urine? Y  Do you have problems with loss of bowel control? N  Managing your Medications? N  Managing your Finances? N  Housekeeping or managing your Housekeeping? N  Some recent data might be hidden    Patient Care Team: Debbrah Alar, NP as PCP - General Phadke, Karsten Ro, MD as Consulting Physician (Endocrinology) [place] Crane, MD as Referring Physician (Rheumatology) Bo Merino, MD as Consulting Physician (Rheumatology) Cherre Robins, [place] (Pharmacist)  Indicate any recent Medical Services you may have received from other than [facility] providers in the past year (date may be approximate).     Assessment:   This is a routine wellness examination for [place].  Hearing/Vision screen Hearing Screening - Comments:: No issues Vision Screening - Comments:: Last eye exam-Dr. Ladell Heads months ago  Dietary issues and exercise activities discussed: Current Exercise Habits: The patient does not participate in regular exercise at present, Exercise limited by: None identified   Goals Addressed             This Visit's Progress    Patient Stated   On track    Drink more water     Quit Smoking       renew strength  and balance    Not on track      Depression Screen PHQ 2/9 Scores 11/07/2021 03/26/2021 01/24/2021 10/24/2020 10/17/2020 08/09/2020 07/25/2019  PHQ - 2 Score 0 1 1 0 0 1 1  PHQ- 9 Score - - - 2 - - -    Fall Risk Fall Risk  11/07/2021 10/17/2020 08/09/2020 07/25/2019 07/21/2018  Falls in the past year? 0 0 0 0 No  Number falls in past yr: 0 0 0 - -  Injury with Fall? 0 0 0 - -  Follow up Falls prevention discussed Falls prevention discussed - - -    FALL RISK PREVENTION PERTAINING TO THE HOME:  Any stairs in or around the home? No  Home free of loose throw rugs in walkways, pet beds, electrical cords, etc? Yes  Adequate lighting in your home to reduce risk of falls? Yes   ASSISTIVE DEVICES UTILIZED TO PREVENT FALLS:  Life alert? No  Use of a cane, walker or w/c? Yes cane occasionally Grab bars in the bathroom? Yes  Shower chair or bench in shower? Yes  Elevated toilet seat or a handicapped toilet? Yes   TIMED UP AND GO:  Was the test performed? No . Phone visit   Cognitive Function:Patient has current diagnosis of memory loss. Patient is followed by neurology for ongoing assessment.    MMSE - Mini Mental State Exam 07/21/2018 07/19/2017  Orientation to time 5 5  Orientation to Place 5 5  Registration 3 3  Attention/ Calculation 5 5  Recall 3 2  Language- name 2 objects 2 2  Language- repeat 1 0  Language- follow 3 step command 3 3  Language- read & follow direction 1 1  Write a sentence 1 1  Copy design 1 1  Total score 30 28     6CIT Screen 07/25/2019  What Year? 0 points  What month? 0 points  What time? 0 points  Count back from 20 0 points  Months in reverse 0 points  Repeat phrase 4 points  Total Score 4    Immunizations Immunization History  Administered Date(s) Administered   Fluad Quad(high Dose 65+) 08/01/2019, 08/06/2020   Influenza Split 10/16/2011   Influenza Whole 09/10/2009, 08/18/2010   Influenza, High Dose Seasonal PF 09/10/2015, 08/31/2016, 07/19/2017,  08/03/2018, 08/06/2021   Influenza,inj,Quad PF,6+ Mos 09/05/2014   Influenza,inj,quad, With Preservative 07/19/2017   Influenza-Unspecified 08/12/2012, 08/09/2013   PFIZER Comirnaty(Gray Top)Covid-19 Tri-Sucrose Vaccine 03/28/2021   PFIZER(Purple Top)SARS-COV-2 Vaccination 12/04/2019, 12/25/2019, 08/16/2020   Pfizer Covid-19 Vaccine Bivalent Booster 56yrs & up 08/06/2021   Pneumococcal Conjugate-13 09/05/2014   Pneumococcal Polysaccharide-23 01/01/2009   Td 12/26/2014    TDAP status: Up to date  Flu Vaccine status: Up to date  Pneumococcal vaccine status: Up to date  Covid-19 vaccine status: Completed vaccines  Qualifies for Shingles Vaccine? Yes   Zostavax completed No   Shingrix Completed?: No.    Education has been provided regarding the importance of this vaccine. Patient has been advised to call insurance company to determine out of pocket expense if they have not yet received this vaccine. Advised may also receive vaccine at local pharmacy or Health Dept. Verbalized acceptance and understanding.  Screening Tests Health Maintenance  Topic Date Due   Zoster Vaccines- Shingrix (1 of 2) Never done   COLONOSCOPY (Pts 45-45yrs Insurance coverage will need to be confirmed)  10/22/2020   TETANUS/TDAP  12/26/2024   Pneumonia Vaccine 33+ Years old  Completed   INFLUENZA VACCINE  Completed  DEXA SCAN  Completed   COVID-19 Vaccine  Completed   HPV VACCINES  Aged Out    Health Maintenance  Health Maintenance Due  Topic Date Due   Zoster Vaccines- Shingrix (1 of 2) Never done   COLONOSCOPY (Pts 45-65yrs Insurance coverage will need to be confirmed)  10/22/2020    Colorectal cancer screening: No longer required.   Mammogram status: Completed bilateral 01/07/2021. Repeat every year  Bone Density status: Completed 04/19/2020. Results reflect: Bone density results: OSTEOPOROSIS. Repeat every 2 years.  Lung Cancer Screening: (Low Dose CT Chest recommended if Age 86-80 years, 30  pack-year currently smoking OR have quit w/in 15years.) does not qualify.     Additional Screening:  Hepatitis C Screening: does not qualify  Vision Screening: Recommended annual ophthalmology exams for early detection of glaucoma and other disorders of the eye. Is the patient up to date with their annual eye exam?  Yes  Who is the provider or what is the name of the office in which the patient attends annual eye exams? Dr. Katy Fitch   Dental Screening: Recommended annual dental exams for proper oral hygiene  Community Resource Referral / Chronic Care Management: CRR required this visit?  No   CCM required this visit?  No      Plan:     I have personally reviewed and noted the following in the patients chart:   Medical and social history Use of alcohol, tobacco or illicit drugs  Current medications and supplements including opioid prescriptions.  Functional ability and status Nutritional status Physical activity Advanced directives List of other physicians Hospitalizations, surgeries, and ER visits in previous 12 months Vitals Screenings to include cognitive, depression, and falls Referrals and appointments  In addition, I have reviewed and discussed with patient certain preventive protocols, quality metrics, and best practice recommendations. A written personalized care plan for preventive services as well as general preventive health recommendations were provided to patient.   Due to this being a telephonic visit, the after visit summary with patients personalized plan was offered to patient via mail or my-chart. Patient would like to access on my-chart.   Marta Antu, LPN   [id number]  Nurse Health Advisor  Nurse Notes: None

## 2021-11-07 NOTE — Patient Instructions (Signed)
Nancy [name] , Thank you for taking time to complete your Medicare Wellness Visit. I appreciate your ongoing commitment to your health goals. Please review the following plan we discussed and let me know if I can assist you in the future.   Screening recommendations/referrals: Colonoscopy: No longer required Mammogram: Completed 01/07/2021-Due 01/07/2022 Bone Density: Completed 04/19/2020-Due 04/19/2022 Recommended yearly ophthalmology/optometry visit for glaucoma screening and checkup Recommended yearly dental visit for hygiene and checkup  Vaccinations: Influenza vaccine: Up to date Pneumococcal vaccine: Up to date Tdap vaccine: Up to date Shingles vaccine: Discuss with pharmacy   Covid-19:Up to date  Advanced directives: As we discussed today, you may pick up packet at your next office visit  Conditions/risks identified: See problem list  Next appointment: Follow up in one year for your annual wellness visit 11/10/2022 @ 11:40(phone visit)   Preventive Care 65 Years and Older, Female Preventive care refers to lifestyle choices and visits with your health care provider that can promote health and wellness. What does preventive care include? A yearly physical exam. This is also called an annual well check. Dental exams once or twice a year. Routine eye exams. Ask your health care provider how often you should have your eyes checked. Personal lifestyle choices, including: Daily care of your teeth and gums. Regular physical activity. Eating a healthy diet. Avoiding tobacco and drug use. Limiting alcohol use. Practicing safe sex. Taking low-dose aspirin every day. Taking vitamin and mineral supplements as recommended by your health care provider. What happens during an annual well check? The services and screenings done by your health care provider during your annual well check will depend on your age, overall health, lifestyle risk factors, and family history of disease. Counseling   Your health care provider may ask you questions about your: Alcohol use. Tobacco use. Drug use. Emotional well-being. Home and relationship well-being. Sexual activity. Eating habits. History of falls. Memory and ability to understand (cognition). Work and work Statistician. Reproductive health. Screening  You may have the following tests or measurements: Height, weight, and BMI. Blood pressure. Lipid and cholesterol levels. These may be checked every 5 years, or more frequently if you are over 6 years old. Skin check. Lung cancer screening. You may have this screening every year starting at age 42 if you have a 30-pack-year history of smoking and currently smoke or have quit within the past 15 years. Fecal occult blood test (FOBT) of the stool. You may have this test every year starting at age 71. Flexible sigmoidoscopy or colonoscopy. You may have a sigmoidoscopy every 5 years or a colonoscopy every 10 years starting at age 24. Hepatitis C blood test. Hepatitis B blood test. Sexually transmitted disease (STD) testing. Diabetes screening. This is done by checking your blood sugar (glucose) after you have not eaten for a while (fasting). You may have this done every 1-3 years. Bone density scan. This is done to screen for osteoporosis. You may have this done starting at age 55. Mammogram. This may be done every 1-2 years. Talk to your health care provider about how often you should have regular mammograms. Talk with your health care provider about your test results, treatment options, and if necessary, the need for more tests. Vaccines  Your health care provider may recommend certain vaccines, such as: Influenza vaccine. This is recommended every year. Tetanus, diphtheria, and acellular pertussis (Tdap, Td) vaccine. You may need a Td booster every 10 years. Zoster vaccine. You may need this after age 62. Pneumococcal 13-valent  conjugate (PCV13) vaccine. One dose is recommended  after age 66. Pneumococcal polysaccharide (PPSV23) vaccine. One dose is recommended after age 79. Talk to your health care provider about which screenings and vaccines you need and how often you need them. This information is not intended to replace advice given to you by your health care provider. Make sure you discuss any questions you have with your health care provider. Document Released: 11/22/2015 Document Revised: 07/15/2016 Document Reviewed: 08/27/2015 Elsevier Interactive Patient Education  2017 [place] Prevention in the Home Falls can cause injuries. They can happen to people of all ages. There are many things you can do to make your home safe and to help prevent falls. What can I do on the outside of my home? Regularly fix the edges of walkways and driveways and fix any cracks. Remove anything that might make you trip as you walk through a door, such as a raised step or threshold. Trim any bushes or trees on the path to your home. Use bright outdoor lighting. Clear any walking paths of anything that might make someone trip, such as rocks or tools. Regularly check to see if handrails are loose or broken. Make sure that both sides of any steps have handrails. Any raised decks and porches should have guardrails on the edges. Have any leaves, snow, or ice cleared regularly. Use sand or salt on walking paths during winter. Clean up any spills in your garage right away. This includes oil or grease spills. What can I do in the bathroom? Use night lights. Install grab bars by the toilet and in the tub and shower. Do not use towel bars as grab bars. Use non-skid mats or decals in the tub or shower. If you need to sit down in the shower, use a plastic, non-slip stool. Keep the floor dry. Clean up any water that spills on the floor as soon as it happens. Remove soap buildup in the tub or shower regularly. Attach bath mats securely with double-sided non-slip rug tape. Do not  have throw rugs and other things on the floor that can make you trip. What can I do in the bedroom? Use night lights. Make sure that you have a light by your bed that is easy to reach. Do not use any sheets or blankets that are too big for your bed. They should not hang down onto the floor. Have a firm chair that has side arms. You can use this for support while you get dressed. Do not have throw rugs and other things on the floor that can make you trip. What can I do in the kitchen? Clean up any spills right away. Avoid walking on wet floors. Keep items that you use a lot in easy-to-reach places. If you need to reach something above you, use a strong step stool that has a grab bar. Keep electrical cords out of the way. Do not use floor polish or wax that makes floors slippery. If you must use wax, use non-skid floor wax. Do not have throw rugs and other things on the floor that can make you trip. What can I do with my stairs? Do not leave any items on the stairs. Make sure that there are handrails on both sides of the stairs and use them. Fix handrails that are broken or loose. Make sure that handrails are as long as the stairways. Check any carpeting to make sure that it is firmly attached to the stairs. Fix any carpet that  is loose or worn. Avoid having throw rugs at the top or bottom of the stairs. If you do have throw rugs, attach them to the floor with carpet tape. Make sure that you have a light switch at the top of the stairs and the bottom of the stairs. If you do not have them, ask someone to add them for you. What else can I do to help prevent falls? Wear shoes that: Do not have high heels. Have rubber bottoms. Are comfortable and fit you well. Are closed at the toe. Do not wear sandals. If you use a stepladder: Make sure that it is fully opened. Do not climb a closed stepladder. Make sure that both sides of the stepladder are locked into place. Ask someone to hold it for  you, if possible. Clearly mark and make sure that you can see: Any grab bars or handrails. First and last steps. Where the edge of each step is. Use tools that help you move around (mobility aids) if they are needed. These include: Canes. Walkers. Scooters. Crutches. Turn on the lights when you go into a dark area. Replace any light bulbs as soon as they burn out. Set up your furniture so you have a clear path. Avoid moving your furniture around. If any of your floors are uneven, fix them. If there are any pets around you, be aware of where they are. Review your medicines with your doctor. Some medicines can make you feel dizzy. This can increase your chance of falling. Ask your doctor what other things that you can do to help prevent falls. This information is not intended to replace advice given to you by your health care provider. Make sure you discuss any questions you have with your health care provider. Document Released: 08/22/2009 Document Revised: 04/02/2016 Document Reviewed: 11/30/2014 Elsevier Interactive Patient Education  2017 [employer].

## 2021-11-11 ENCOUNTER — Telehealth: Payer: Self-pay

## 2021-11-11 NOTE — Telephone Encounter (Signed)
Prolia VOB initiated via [email or web address]  Last OV:  Next OV:  Last Prolia inj: 05/29/21 Next Prolia inj DUE: 11/30/21

## 2021-11-14 ENCOUNTER — Ambulatory Visit (INDEPENDENT_AMBULATORY_CARE_PROVIDER_SITE_OTHER): Payer: Medicare Other | Admitting: Family

## 2021-11-14 VITALS — BP 124/58 | HR 66 | Temp 98.2°F | Resp 16 | Ht 63.0 in | Wt 117.0 lb

## 2021-11-14 DIAGNOSIS — G629 Polyneuropathy, unspecified: Secondary | ICD-10-CM

## 2021-11-14 DIAGNOSIS — M545 Low back pain, unspecified: Secondary | ICD-10-CM | POA: Diagnosis not present

## 2021-11-14 MED ORDER — GABAPENTIN 100 MG PO CAPS: 100.0000 mg | ORAL_CAPSULE | Freq: Three times a day (TID) | ORAL | 3 refills | Status: DC

## 2021-11-14 NOTE — Patient Instructions (Addendum)
Please begin gabapentin 3 times daily for nerve pain in your feet.  You can try doing the back exercises twice daily as printed for back pain. You can also use tylenol as needed for back pain and heating pad as needed (don't sleep with heating pad).

## 2021-11-14 NOTE — Progress Notes (Signed)
Subjective:     [name], female    DOB: 27-Mar-1941, 81 y.o.   MRN: [id number]  Chief Complaint  Patient presents with   Back Pain    Complains of lower back pain    Back Pain   Patient is in today to discuss two concerns:  1) low back pain- pain across lower back. Does not radiate. Worse with bendign.  2) burning pain across the tops of both feet. Experiences during the day and during the night.    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[facility])    Thyroid disease    TIA (transient ischemic attack) 2016   Urinary incontinence     Past Surgical History:  Procedure Laterality Date   ABDOMINAL HYSTERECTOMY  1976   CATARACT EXTRACTION Bilateral    LOWER EXTREMITY ANGIOGRAPHY N/A 08/29/2019   Procedure: LOWER EXTREMITY ANGIOGRAPHY;  Surgeon: Serafina Mitchell, MD;  Location: [facility];  Service: Cardiovascular;  Laterality: N/A;   PERIPHERAL VASCULAR INTERVENTION Right 08/29/2019   Procedure: PERIPHERAL VASCULAR INTERVENTION;  Surgeon: Serafina Mitchell, MD;  Location: [facility];  Service: Cardiovascular;  Laterality: Right;   TUMOR REMOVAL  last was 1976   left ankle x3    Family History  Problem Relation Age of Onset   Hypertension Father    Colon cancer Father    Ulcerative colitis Mother    Parkinson's disease Mother    Heart disease Sister 42       CABG x 3   Cirrhosis Brother    Alcohol abuse Brother    Colitis Daughter    Colon polyps Daughter    Heart attack Daughter    Hypertension Son    Kidney  disease Son        transplant due to kidney problems after [place]   Arthritis Other    Coronary artery disease Other    Hyperlipidemia Other    Hypertension Other    Stroke Sister        died 28   Arthritis Sister    Hypertension Sister    Thyroid disease Daughter        x 3   Cirrhosis Brother 92       ETOH abuse    Social History   Socioeconomic History   Marital status: Divorced    Spouse name: Not on file   Number of children: 8   Years of education: Not on file   Highest education level: Not on file  Occupational History   Occupation: retired    Fish farm manager: RETIRED    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  Tobacco Use   Smoking status: Some Days    Packs/day: 0.50    Years: 32.00    Pack years: 16.00    Types: Cigarettes   Smokeless tobacco: Never   Tobacco comments:    6 or 7 per day  Vaping Use   Vaping Use: Never  used  Substance and Sexual Activity   Alcohol use: No    Alcohol/week: 0.0 standard drinks   Drug use: No   Sexual activity: Never  Other Topics Concern   Not on file  Social History Narrative   Lives alone   Right Handed    Drinks 6-8 cups caffeine daily   Social Determinants of Health   Financial Resource Strain: Low Risk    Difficulty of Paying Living Expenses: Not hard at all  Food Insecurity: No Food Insecurity   Worried About Charity fundraiser in the Last Year: Never true   Ran Out of Food in the Last Year: Never true  Transportation Needs: No Transportation Needs   Lack of Transportation (Medical): No   Lack of Transportation (Non-Medical): No  Physical Activity: Inactive   Days of Exercise per Week: 0 days   Minutes of Exercise per Session: 0 min  Stress: No Stress Concern Present   Feeling of Stress : Not at all  Social Connections: Moderately Isolated   Frequency of Communication with Friends and Family: More than three times a week   Frequency of Social Gatherings with Friends and Family: More than three times a  week   Attends Religious Services: More than 4 times per year   Active Member of [employer] or Organizations: No   Attends Archivist Meetings: Never   Marital Status: Divorced  Human resources officer Violence: Not At Risk   Fear of Current or Ex-Partner: No   Emotionally Abused: No   Physically Abused: No   Sexually Abused: No    Outpatient Medications Prior to Visit  Medication Sig Dispense Refill   acetaminophen (TYLENOL) 500 MG tablet Take 500 mg by mouth every 6 (six) hours as needed for moderate pain.     Adalimumab 40 MG/0.4ML PNKT Inject 40 mg into the skin once a week. Saturday     albuterol (VENTOLIN HFA) 108 (90 Base) MCG/ACT inhaler Inhale 2 puffs into the lungs every 6 (six) hours as needed for wheezing or shortness of breath. 8 g 0   carvedilol (COREG) 25 MG tablet TAKE 1 TABLET BY MOUTH TWICE A DAY 180 tablet 1   cloNIDine (CATAPRES) 0.1 MG tablet TAKE 1 TABLET BY MOUTH 3 TIMES DAILY. 270 tablet 1   clopidogrel (PLAVIX) 75 MG tablet TAKE 1 TABLET BY MOUTH EVERY DAY 90 tablet 1   colestipol (COLESTID) 1 g tablet TAKE 1 TABLET BY MOUTH EVERY DAY 90 tablet 1   fluticasone (FLONASE) 50 MCG/ACT nasal spray Place 1 spray into both nostrils daily as needed for allergies or rhinitis.     hydrALAZINE (APRESOLINE) 25 MG tablet TAKE 1 TABLET BY MOUTH THREE TIMES A DAY 270 tablet 1   leflunomide (ARAVA) 20 MG tablet Take 1 tablet (20 mg total) by mouth daily.     losartan (COZAAR) 100 MG tablet TAKE 1 TABLET BY MOUTH EVERY DAY 90 tablet 1   mycophenolate (CELLCEPT) 500 MG tablet Take 500 mg by mouth daily.     polyethylene glycol powder (GLYCOLAX/MIRALAX) 17 GM/SCOOP powder Take 17 g by mouth daily. 119 g 11   prednisoLONE acetate (PRED FORTE) 1 % ophthalmic suspension Place 1 drop into both eyes daily.      predniSONE (DELTASONE) 5 MG tablet Take 5 mg by mouth daily as needed.     PROLIA 60 MG/ML SOSY injection Inject 60 mg as directed every 6 (six) months.     rosuvastatin (CRESTOR)  40 MG tablet TAKE  1 TABLET BY MOUTH EVERY DAY 90 tablet 1   No facility-administered medications prior to visit.    Allergies  Allergen Reactions   Iron Nausea And Vomiting    Can take infusions    Norvasc [Amlodipine] Other (See Comments)    dizziness    Review of Systems  Musculoskeletal:  Positive for back pain.      Objective:    Physical Exam Constitutional:      Appearance: Normal appearance.  HENT:     Head: Normocephalic and atraumatic.  Cardiovascular:     Rate and Rhythm: Normal rate.  Pulmonary:     Effort: Pulmonary effort is normal.  Skin:    General: Skin is warm.  Neurological:     Mental Status: She is alert.     Comments: Diminished foot sensation to monofilament     BP (!) 124/58 (BP Location: Right Arm, Patient Position: Sitting, Cuff Size: Small)    Pulse 66    Temp 98.2 F (36.8 C) (Oral)    Resp 16    Ht 5\' 3"  (1.6 m)    Wt 117 lb (53.1 kg)    SpO2 99%    BMI 20.73 kg/m  Wt Readings from Last 3 Encounters:  11/14/21 117 lb (53.1 kg)  11/07/21 115 lb (52.2 kg)  10/10/21 118 lb 3.2 oz (53.6 kg)       Assessment & Plan:   Problem List Items Addressed This Visit       Unprioritized   Neuropathy - Primary    New. Follow up b12 level is normal. Recommended trial of gabapentin tid.      Back pain    Pt is advised as follows:   You can try doing the back exercises twice daily as printed for back pain. You can also use tylenol as needed for back pain and heating pad as needed (don't sleep with heating pad).        I am having Conrad Floodwood start on gabapentin. I am also having her maintain her Adalimumab, Prolia, mycophenolate, predniSONE, prednisoLONE acetate, fluticasone, leflunomide, acetaminophen, colestipol, rosuvastatin, carvedilol, clopidogrel, losartan, cloNIDine, hydrALAZINE, polyethylene glycol powder, and albuterol.  Meds ordered this encounter  Medications   gabapentin (NEURONTIN) 100 MG capsule    Sig: Take 1 capsule (100  mg total) by mouth 3 (three) times daily.    Dispense:  90 capsule    Refill:  3    Order Specific Question:   Supervising Provider    Answer:   Penni Homans A [[id number]]

## 2021-11-14 NOTE — Assessment & Plan Note (Signed)
New. Follow up b12 level is normal. Recommended trial of gabapentin tid.

## 2021-11-14 NOTE — Assessment & Plan Note (Signed)
Pt is advised as follows:   You can try doing the back exercises twice daily as printed for back pain. You can also use tylenol as needed for back pain and heating pad as needed (don't sleep with heating pad).

## 2021-11-18 NOTE — Progress Notes (Signed)
Reviewed and agree with documentation and assessment and plan. K. Veena Falon Huesca , MD   

## 2021-11-21 NOTE — Telephone Encounter (Signed)
Prior auth required for Prolia ° °PA PROCESS DETAILS: Please complete the prior authorization form located at [email or web address] or call [phone]. °

## 2021-11-29 NOTE — Telephone Encounter (Addendum)
Prior auth initiated via [facility] provider portal PA#: [id number] Valid 11/29/21-11/29/22

## 2021-12-02 ENCOUNTER — Ambulatory Visit (INDEPENDENT_AMBULATORY_CARE_PROVIDER_SITE_OTHER): Payer: Medicare Other

## 2021-12-02 DIAGNOSIS — E538 Deficiency of other specified B group vitamins: Secondary | ICD-10-CM | POA: Diagnosis not present

## 2021-12-02 MED ORDER — CYANOCOBALAMIN 1000 MCG/ML IJ SOLN
1000.0000 ug | Freq: Once | INTRAMUSCULAR | Status: AC
  Administered 2021-12-02: 10:00:00 1000 ug via INTRAMUSCULAR

## 2021-12-02 NOTE — Progress Notes (Signed)
Pt here for monthly B12 injection per PCP orders.   B12 1066mcg given IM in right deltoid, and pt tolerated injection well.  Next B12 injection scheduled for one month.   Nancy [name]

## 2021-12-03 DIAGNOSIS — M0579 Rheumatoid arthritis with rheumatoid factor of multiple sites without organ or systems involvement: Secondary | ICD-10-CM | POA: Diagnosis not present

## 2021-12-09 ENCOUNTER — Emergency Department (HOSPITAL_BASED_OUTPATIENT_CLINIC_OR_DEPARTMENT_OTHER): Payer: Medicare Other

## 2021-12-09 ENCOUNTER — Other Ambulatory Visit: Payer: Self-pay

## 2021-12-09 ENCOUNTER — Encounter (HOSPITAL_BASED_OUTPATIENT_CLINIC_OR_DEPARTMENT_OTHER): Payer: Self-pay

## 2021-12-09 ENCOUNTER — Inpatient Hospital Stay (HOSPITAL_BASED_OUTPATIENT_CLINIC_OR_DEPARTMENT_OTHER)
Admission: EM | Admit: 2021-12-09 | Discharge: 2021-12-15 | DRG: 522 | Disposition: A | Discharge status: Skilled Nursing Facility | Payer: Medicare Other | Attending: Family Medicine | Admitting: Family Medicine

## 2021-12-09 DIAGNOSIS — S72012A Unspecified intracapsular fracture of left femur, initial encounter for closed fracture: Principal | ICD-10-CM | POA: Diagnosis present

## 2021-12-09 DIAGNOSIS — F32A Depression, unspecified: Secondary | ICD-10-CM | POA: Diagnosis not present

## 2021-12-09 DIAGNOSIS — G629 Polyneuropathy, unspecified: Secondary | ICD-10-CM | POA: Diagnosis present

## 2021-12-09 DIAGNOSIS — I10 Essential (primary) hypertension: Secondary | ICD-10-CM | POA: Diagnosis not present

## 2021-12-09 DIAGNOSIS — Z79899 Other long term (current) drug therapy: Secondary | ICD-10-CM | POA: Diagnosis not present

## 2021-12-09 DIAGNOSIS — Z8249 Family history of ischemic heart disease and other diseases of the circulatory system: Secondary | ICD-10-CM | POA: Diagnosis not present

## 2021-12-09 DIAGNOSIS — M25552 Pain in left hip: Secondary | ICD-10-CM | POA: Diagnosis not present

## 2021-12-09 DIAGNOSIS — M6281 Muscle weakness (generalized): Secondary | ICD-10-CM | POA: Diagnosis not present

## 2021-12-09 DIAGNOSIS — Z8673 Personal history of transient ischemic attack (TIA), and cerebral infarction without residual deficits: Secondary | ICD-10-CM

## 2021-12-09 DIAGNOSIS — S79929A Unspecified injury of unspecified thigh, initial encounter: Secondary | ICD-10-CM | POA: Diagnosis not present

## 2021-12-09 DIAGNOSIS — E039 Hypothyroidism, unspecified: Secondary | ICD-10-CM | POA: Diagnosis not present

## 2021-12-09 DIAGNOSIS — Z90710 Acquired absence of both cervix and uterus: Secondary | ICD-10-CM

## 2021-12-09 DIAGNOSIS — F17210 Nicotine dependence, cigarettes, uncomplicated: Secondary | ICD-10-CM | POA: Diagnosis present

## 2021-12-09 DIAGNOSIS — Z01818 Encounter for other preprocedural examination: Secondary | ICD-10-CM

## 2021-12-09 DIAGNOSIS — Z20822 Contact with and (suspected) exposure to covid-19: Secondary | ICD-10-CM | POA: Diagnosis present

## 2021-12-09 DIAGNOSIS — W19XXXA Unspecified fall, initial encounter: Secondary | ICD-10-CM | POA: Diagnosis not present

## 2021-12-09 DIAGNOSIS — E538 Deficiency of other specified B group vitamins: Secondary | ICD-10-CM | POA: Diagnosis not present

## 2021-12-09 DIAGNOSIS — M069 Rheumatoid arthritis, unspecified: Secondary | ICD-10-CM | POA: Diagnosis present

## 2021-12-09 DIAGNOSIS — Z743 Need for continuous supervision: Secondary | ICD-10-CM | POA: Diagnosis not present

## 2021-12-09 DIAGNOSIS — W06XXXA Fall from bed, initial encounter: Secondary | ICD-10-CM | POA: Diagnosis present

## 2021-12-09 DIAGNOSIS — R2689 Other abnormalities of gait and mobility: Secondary | ICD-10-CM | POA: Diagnosis not present

## 2021-12-09 DIAGNOSIS — I69398 Other sequelae of cerebral infarction: Secondary | ICD-10-CM | POA: Diagnosis not present

## 2021-12-09 DIAGNOSIS — K08109 Complete loss of teeth, unspecified cause, unspecified class: Secondary | ICD-10-CM | POA: Diagnosis not present

## 2021-12-09 DIAGNOSIS — S72042A Displaced fracture of base of neck of left femur, initial encounter for closed fracture: Secondary | ICD-10-CM | POA: Diagnosis not present

## 2021-12-09 DIAGNOSIS — Z823 Family history of stroke: Secondary | ICD-10-CM | POA: Diagnosis not present

## 2021-12-09 DIAGNOSIS — R2681 Unsteadiness on feet: Secondary | ICD-10-CM | POA: Diagnosis not present

## 2021-12-09 DIAGNOSIS — S72002D Fracture of unspecified part of neck of left femur, subsequent encounter for closed fracture with routine healing: Secondary | ICD-10-CM | POA: Diagnosis not present

## 2021-12-09 DIAGNOSIS — Z888 Allergy status to other drugs, medicaments and biological substances status: Secondary | ICD-10-CM | POA: Diagnosis not present

## 2021-12-09 DIAGNOSIS — Z471 Aftercare following joint replacement surgery: Secondary | ICD-10-CM | POA: Diagnosis not present

## 2021-12-09 DIAGNOSIS — Z940 Kidney transplant status: Secondary | ICD-10-CM

## 2021-12-09 DIAGNOSIS — E871 Hypo-osmolality and hyponatremia: Secondary | ICD-10-CM | POA: Diagnosis not present

## 2021-12-09 DIAGNOSIS — Z8261 Family history of arthritis: Secondary | ICD-10-CM

## 2021-12-09 DIAGNOSIS — R410 Disorientation, unspecified: Secondary | ICD-10-CM | POA: Diagnosis not present

## 2021-12-09 DIAGNOSIS — D509 Iron deficiency anemia, unspecified: Secondary | ICD-10-CM | POA: Diagnosis present

## 2021-12-09 DIAGNOSIS — Z7401 Bed confinement status: Secondary | ICD-10-CM | POA: Diagnosis not present

## 2021-12-09 DIAGNOSIS — M810 Age-related osteoporosis without current pathological fracture: Secondary | ICD-10-CM | POA: Diagnosis not present

## 2021-12-09 DIAGNOSIS — S72002A Fracture of unspecified part of neck of left femur, initial encounter for closed fracture: Secondary | ICD-10-CM

## 2021-12-09 DIAGNOSIS — Z7952 Long term (current) use of systemic steroids: Secondary | ICD-10-CM | POA: Diagnosis not present

## 2021-12-09 DIAGNOSIS — E785 Hyperlipidemia, unspecified: Secondary | ICD-10-CM | POA: Diagnosis present

## 2021-12-09 DIAGNOSIS — Z96642 Presence of left artificial hip joint: Secondary | ICD-10-CM

## 2021-12-09 DIAGNOSIS — I69828 Other speech and language deficits following other cerebrovascular disease: Secondary | ICD-10-CM | POA: Diagnosis not present

## 2021-12-09 DIAGNOSIS — Z419 Encounter for procedure for purposes other than remedying health state, unspecified: Secondary | ICD-10-CM

## 2021-12-09 DIAGNOSIS — Z043 Encounter for examination and observation following other accident: Secondary | ICD-10-CM | POA: Diagnosis not present

## 2021-12-09 DIAGNOSIS — R6889 Other general symptoms and signs: Secondary | ICD-10-CM | POA: Diagnosis not present

## 2021-12-09 LAB — BASIC METABOLIC PANEL
Anion gap: 7 (ref 5–15)
BUN: 14 mg/dL (ref 8–23)
CO2: 28 mmol/L (ref 22–32)
Calcium: 9.4 mg/dL (ref 8.9–10.3)
Chloride: 104 mmol/L (ref 98–111)
Creatinine, Ser: 0.79 mg/dL (ref 0.44–1.00)
GFR, Estimated: >60 mL/min (ref >60)
Glucose, Bld: 107 mg/dL — ABNORMAL HIGH (ref 70–99)
Potassium: 3.9 mmol/L (ref 3.5–5.1)
Sodium: 139 mmol/L (ref 135–145)

## 2021-12-09 LAB — CBC
HCT: 37.2 % (ref 36.0–46.0)
Hemoglobin: 11.9 g/dL — ABNORMAL LOW (ref 12.0–15.0)
MCH: 28.3 pg (ref 26.0–34.0)
MCHC: 32 g/dL (ref 30.0–36.0)
MCV: 88.4 fL (ref 80.0–100.0)
Platelets: 210 10*3/uL (ref 150–400)
RBC: 4.21 MIL/uL (ref 3.87–5.11)
RDW: 15.8 % — ABNORMAL HIGH (ref 11.5–15.5)
WBC: 13.3 10*3/uL — ABNORMAL HIGH (ref 4.0–10.5)
nRBC: 0 % (ref 0.0–0.2)

## 2021-12-09 LAB — TYPE AND SCREEN
ABO/RH(D): AB POS
Antibody Screen: NEGATIVE

## 2021-12-09 LAB — RESP PANEL BY RT-PCR (FLU A&B, COVID) ARPGX2
Influenza A by PCR: NEGATIVE
Influenza B by PCR: NEGATIVE
SARS Coronavirus 2 by RT PCR: NEGATIVE

## 2021-12-09 LAB — SURGICAL PCR SCREEN
MRSA, PCR: NEGATIVE
Staphylococcus aureus: NEGATIVE

## 2021-12-09 MED ORDER — ALBUTEROL SULFATE HFA 108 (90 BASE) MCG/ACT IN AERS: 2.0000 | INHALATION_SPRAY | Freq: Four times a day (QID) | RESPIRATORY_TRACT | Status: DC | PRN

## 2021-12-09 MED ORDER — MORPHINE SULFATE (PF) 2 MG/ML IV SOLN
2.0000 mg | INTRAVENOUS | Status: DC | PRN
  Administered 2021-12-09 – 2021-12-10 (×3): 2 mg via INTRAVENOUS
  Filled 2021-12-09 (×3): qty 1

## 2021-12-09 MED ORDER — ALBUTEROL SULFATE (2.5 MG/3ML) 0.083% IN NEBU: 2.5000 mg | INHALATION_SOLUTION | Freq: Four times a day (QID) | RESPIRATORY_TRACT | Status: DC | PRN

## 2021-12-09 MED ORDER — POLYETHYLENE GLYCOL 3350 17 G PO PACK
17.0000 g | PACK | Freq: Every day | ORAL | Status: DC | PRN
  Administered 2021-12-14: 17 g via ORAL
  Filled 2021-12-09: qty 1

## 2021-12-09 MED ORDER — CLONIDINE HCL 0.1 MG PO TABS
0.1000 mg | ORAL_TABLET | Freq: Three times a day (TID) | ORAL | Status: DC
  Administered 2021-12-09 – 2021-12-15 (×15): 0.1 mg via ORAL
  Filled 2021-12-09 (×15): qty 1

## 2021-12-09 MED ORDER — HYDROCODONE-ACETAMINOPHEN 5-325 MG PO TABS
1.0000 | ORAL_TABLET | Freq: Four times a day (QID) | ORAL | Status: DC | PRN
  Administered 2021-12-09 – 2021-12-14 (×6): 1 via ORAL
  Filled 2021-12-09 (×6): qty 1

## 2021-12-09 MED ORDER — HYDRALAZINE HCL 25 MG PO TABS
25.0000 mg | ORAL_TABLET | Freq: Three times a day (TID) | ORAL | Status: DC
  Administered 2021-12-09 – 2021-12-14 (×14): 25 mg via ORAL
  Filled 2021-12-09 (×14): qty 1

## 2021-12-09 MED ORDER — METHOCARBAMOL 500 MG PO TABS: 500.0000 mg | ORAL_TABLET | Freq: Four times a day (QID) | ORAL | Status: DC | PRN

## 2021-12-09 MED ORDER — CARVEDILOL 25 MG PO TABS
25.0000 mg | ORAL_TABLET | Freq: Two times a day (BID) | ORAL | Status: DC
  Administered 2021-12-09 – 2021-12-15 (×12): 25 mg via ORAL
  Filled 2021-12-09 (×12): qty 1

## 2021-12-09 MED ORDER — POVIDONE-IODINE 10 % EX SWAB
2.0000 "application " | Freq: Once | CUTANEOUS | Status: AC
  Administered 2021-12-10: 2 via TOPICAL

## 2021-12-09 MED ORDER — MUPIROCIN 2 % EX OINT
1.0000 "application " | TOPICAL_OINTMENT | Freq: Two times a day (BID) | CUTANEOUS | Status: AC
  Administered 2021-12-09 – 2021-12-14 (×10): 1 via NASAL
  Filled 2021-12-09 (×3): qty 22

## 2021-12-09 MED ORDER — ENSURE PRE-SURGERY PO LIQD
296.0000 mL | Freq: Once | ORAL | Status: DC
  Filled 2021-12-09: qty 296

## 2021-12-09 MED ORDER — MORPHINE SULFATE (PF) 4 MG/ML IV SOLN
4.0000 mg | Freq: Every day | INTRAVENOUS | Status: DC | PRN
  Administered 2021-12-09: 4 mg via INTRAVENOUS
  Filled 2021-12-09: qty 1

## 2021-12-09 MED ORDER — FENTANYL CITRATE PF 50 MCG/ML IJ SOSY
75.0000 ug | PREFILLED_SYRINGE | Freq: Once | INTRAMUSCULAR | Status: AC
  Administered 2021-12-09: 75 ug via INTRAVENOUS
  Filled 2021-12-09: qty 2

## 2021-12-09 MED ORDER — METHOCARBAMOL 1000 MG/10ML IJ SOLN
500.0000 mg | Freq: Four times a day (QID) | INTRAVENOUS | Status: DC | PRN
  Administered 2021-12-10: 500 mg via INTRAVENOUS
  Filled 2021-12-09: qty 500
  Filled 2021-12-09: qty 5

## 2021-12-09 MED ORDER — GABAPENTIN 100 MG PO CAPS
100.0000 mg | ORAL_CAPSULE | Freq: Three times a day (TID) | ORAL | Status: DC
  Administered 2021-12-09 – 2021-12-15 (×16): 100 mg via ORAL
  Filled 2021-12-09 (×16): qty 1

## 2021-12-09 MED ORDER — DOCUSATE SODIUM 100 MG PO CAPS
100.0000 mg | ORAL_CAPSULE | Freq: Two times a day (BID) | ORAL | Status: DC
  Administered 2021-12-09 – 2021-12-15 (×12): 100 mg via ORAL
  Filled 2021-12-09 (×12): qty 1

## 2021-12-09 MED ORDER — CHLORHEXIDINE GLUCONATE 4 % EX LIQD
60.0000 mL | Freq: Once | CUTANEOUS | Status: AC
  Administered 2021-12-10: 4 via TOPICAL
  Filled 2021-12-09: qty 60

## 2021-12-09 NOTE — Assessment & Plan Note (Addendum)
Preoperative medical evaluation No Coronary revascularization / CVA  In last 5 years. No Recent stress test. She can Climb flight of stair, participates in recreational activity, does household chores. No Prior adverse event with anesthesia. No Alcohol use, drug use.  Cardiac risk: Based on RCRI She is a low risk for adverse Cardiac outcome from surgery. No further work-up necessary prior to surgery.

## 2021-12-09 NOTE — Assessment & Plan Note (Addendum)
Patient takes leflunomide,  adalimumab and CellCept on  scheduled basis and prednisone on as-needed basis. Has not used prednisone more than 2 times a month. Does not require stress dose steroids.. Hold RA medications and will resume postoperatively in 2 to 4 weeks depend upon the healing of incision.

## 2021-12-09 NOTE — H&P (Signed)
History and Physical    Patient: Nancy [name] ENI:[id number] DOB: Jun 02, 1941 DOA: 12/09/2021 DOS: the patient was seen and examined on 12/09/2021 PCP: Debbrah Alar, NP  Patient coming from: Home  Chief Complaint:  Chief Complaint  Patient presents with   Fall    HPI: Nancy [name] is a 81 y.o. female with medical history significant of rheumatoid arthritis, TIA, uncontrolled hypertension, HLD, depression. Patient presented with a mechanical fall. Patient was in her kitchen, turned around and had a lost her balance and had a fall.  She denies any head injury or neck injury.  No fever no chills.  No nausea vomiting or diarrhea prior to the event.  No new medication that she started recently. No loss of bowel or bladder function. No focal deficit at the time of my evaluation. No chest pain abdominal pain. Can ambulate around the hallway without any complaints of shortness of breath prior to the event. No prior history of cardiac procedures or surgeries.  No prior history of surgical complications.  Review of Systems: As mentioned in the history of present illness. All other systems reviewed and are negative. 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   Cataract    Colon polyps    CVA (cerebral vascular accident) ([employer]) 02/2017   Depression    Hyperlipidemia    Hypertension    Osteoporosis 01/01/2015   Rheumatoid arteritis (HCC)    Thyroid disease    TIA (transient ischemic attack) 2016   Urinary incontinence    Past Surgical History:  Procedure Laterality Date   ABDOMINAL HYSTERECTOMY  1976   CATARACT EXTRACTION Bilateral    LOWER EXTREMITY ANGIOGRAPHY N/A 08/29/2019   Procedure: LOWER EXTREMITY ANGIOGRAPHY;  Surgeon: Serafina Mitchell, MD;  Location: [place] [facility];  Service: Cardiovascular;  Laterality: N/A;   PERIPHERAL VASCULAR INTERVENTION Right 08/29/2019   Procedure: PERIPHERAL VASCULAR  INTERVENTION;  Surgeon: Serafina Mitchell, MD;  Location: [place] [facility];  Service: Cardiovascular;  Laterality: Right;   TUMOR REMOVAL  last was 1976   left ankle x3   Social History:  reports that she has been smoking cigarettes. She has a 16.00 pack-year smoking history. She has never used smokeless tobacco. She reports that she does not drink alcohol and does not use drugs.  Allergies  Allergen Reactions   Iron Nausea And Vomiting    Can take infusions     Family History  Problem Relation Age of Onset   Hypertension Father    Colon cancer Father    Ulcerative colitis Mother    Parkinson's disease Mother    Heart disease Sister 55       CABG x 3   Cirrhosis Brother    Alcohol abuse Brother    Colitis Daughter    Colon polyps Daughter    Heart attack Daughter    Hypertension Son    Kidney disease Son        transplant due to kidney problems after Desert Storm   Arthritis Other    Coronary artery disease Other    Hyperlipidemia Other    Hypertension Other    Stroke Sister        died 51   Arthritis Sister    Hypertension Sister    Thyroid disease Daughter        x 3   Cirrhosis Brother 42  ETOH abuse    Prior to Admission medications   Medication Sig Start Date End Date Taking? Authorizing Provider  acetaminophen (TYLENOL) 500 MG tablet Take 500 mg by mouth every 6 (six) hours as needed for moderate pain.    [provider]  Adalimumab 40 MG/0.4ML PNKT Inject 40 mg into the skin once a week. Saturday    [provider]  albuterol (VENTOLIN HFA) 108 (90 Base) MCG/ACT inhaler Inhale 2 puffs into the lungs every 6 (six) hours as needed for wheezing or shortness of breath. 10/10/21   Debbrah Alar, NP  carvedilol (COREG) 25 MG tablet TAKE 1 TABLET BY MOUTH TWICE A DAY 07/24/21   Debbrah Alar, NP  cloNIDine (CATAPRES) 0.1 MG tablet TAKE 1 TABLET BY MOUTH 3 TIMES DAILY. 08/01/21   Debbrah Alar, NP  colestipol (COLESTID) 1 g  tablet TAKE 1 TABLET BY MOUTH EVERY DAY 05/08/21   Mauri Pole, MD  fluticasone (FLONASE) 50 MCG/ACT nasal spray Place 1 spray into both nostrils daily as needed for allergies or rhinitis.    [provider]  gabapentin (NEURONTIN) 100 MG capsule Take 1 capsule (100 mg total) by mouth 3 (three) times daily. 11/14/21   Debbrah Alar, NP  hydrALAZINE (APRESOLINE) 25 MG tablet TAKE 1 TABLET BY MOUTH THREE TIMES A DAY 09/05/21   Debbrah Alar, NP  leflunomide (ARAVA) 20 MG tablet Take 1 tablet (20 mg total) by mouth daily. 02/20/20   Debbrah Alar, NP  losartan (COZAAR) 100 MG tablet TAKE 1 TABLET BY MOUTH EVERY DAY 08/01/21   Debbrah Alar, NP  mycophenolate (CELLCEPT) 500 MG tablet Take 500 mg by mouth daily. 12/12/18   [provider]  polyethylene glycol powder (GLYCOLAX/MIRALAX) 17 GM/SCOOP powder Take 17 g by mouth daily. 09/24/21   Zehr, Laban Emperor, PA-C  prednisoLONE acetate (PRED FORTE) 1 % ophthalmic suspension Place 1 drop into both eyes daily.     [provider]  predniSONE (DELTASONE) 5 MG tablet Take 5 mg by mouth daily as needed.    [provider]  PROLIA 60 MG/ML SOSY injection Inject 60 mg as directed every 6 (six) months. 05/11/18   [provider]    Physical Exam: Vitals:   12/09/21 1330 12/09/21 1500 12/09/21 1545 12/09/21 1603  BP: (!) 188/71 (!) 198/57 (!) 191/61 (!) 177/78  Pulse: 71 68 70 74  Resp:  17 17 18   Temp:   99.7 F (37.6 C) 99.2 F (37.3 C)  TempSrc:   Oral Oral  SpO2: 94% 95% 93% 99%  Weight:      Height:       General: Appear in mild distress, no Rash; Oral Mucosa Clear, moist. no Abnormal Neck Mass Or lumps, Conjunctiva normal  Cardiovascular: S1 and S2 Present, no Murmur, Respiratory: good respiratory effort, Bilateral Air entry present and CTA, no Crackles, no wheezes Abdomen: Bowel Sound present, Soft and no tenderness Extremities: no Pedal edema Neurology: alert and oriented to  time, place, and person affect appropriate. no new focal deficit Gait not checked due to patient safety concerns   Data Reviewed:  I have Reviewed nursing notes, Vitals, and Lab results since pt's last encounter. Pertinent lab results CBC and CMP shows stable hemoglobin, elevated WBC, stable creatinine I have ordered test including CBC and BMP and type and screen I have independently visualized and interpreted imaging x-ray chest which showed no evidence of cardiopulmonary disease. I have independently visualized and interpreted old EKG which showed EKG: normal sinus  rhythm, nonspecific ST and T waves changes.  EKG for 1/31 2023 ordered. I have reviewed the last note from orthopedics Dr. Lyla Glassing,  and discussed pt's care plan and test results with them.   Assessment and Plan: * Subcapital fracture of neck of left femur, closed, initial encounter ([place])- (present on admission) History of osteoporosis. Presents with a mechanical fall. Patient was in the kitchen and turned and lost her balance and fell on the ground. No head injury or neck injury. X-ray shows evidence of left subcapital fracture femur. On Prolia for osteoporosis. Orthopedics were consulted. Patient scheduled for surgery on 12/10/2021. No chemical DVT prophylaxis for now. Does not take Plavix for last 6 months. N.p.o. after midnight.    Preop examination Preoperative medical evaluation No Coronary revascularization/CVA within 5 years. No Recent stress test. Can Climb flight of stair, participates in recreational activity,does household chores. No Prior adverse event with anesthesia. No Alcohol use, drug use.  Cardiac risk: Based on RCRI the patient is a low risk for adverse Cardiac outcome from surgery. No further work-up necessary prior to surgery.  Rheumatoid arthritis ([place])- (present on admission) Patient is on leflunomide adalimumab and CellCept on a scheduled basis and prednisone on as-needed basis. Does not  use prednisone more than 2 times a month. Does not require stress dose steroids. Holding rheumatoid arthritis medications preop.  Essential hypertension- (present on admission) Appears to have uncontrolled hypertension outpatient. Currently on carvedilol 25 mg twice daily, clonidine 0.1 mg 3 times daily, losartan 100 mg daily, hydralazine 25 mg 3 times daily. Will continue carvedilol, hydralazine and clonidine.  Hold losartan.  Iron deficiency anemia- (present on admission) H&H relatively stable. Currently no active bleeding. Monitor. At risk for postop blood loss anemia.  History of CVA (cerebrovascular accident) Prior history of TIA/CVA although I am unable to confirm on MRI findings. Patient stopped taking Plavix on her own 6 months ago. Would recommend resuming at least a baby aspirin postop.  Hyperlipidemia- (present on admission) On colestipol. Monitor.  Depression- (present on admission) Mood stable. Currently no issues.  Neuropathy On gabapentin.  Will resume.       Advance Care Planning:   Code Status: Full Code discussed with the patient and daughter. Recommend to arrange for healthcare power of attorney although patient would like her daughter to be her power of attorney. Recommended to have further granular discussion about goals of care as the patient would like to go for CPR when she would like to be transition to comfort.  Consults: Orthopedics  Family Communication: Daughter at bedside.  Questions answered.  Severity of Illness: The appropriate patient status for this patient is OBSERVATION. Observation status is judged to be reasonable and necessary in order to provide the required intensity of service to ensure the patient's safety. The patient's presenting symptoms, physical exam findings, and initial radiographic and laboratory data in the context of their medical condition is felt to place them at decreased risk for further clinical deterioration.  Furthermore, it is anticipated that the patient will be medically stable for discharge from the hospital within 2 midnights of admission.   Author: Berle Mull, MD 12/09/2021 8:08 PM  For on call review [email or web address].

## 2021-12-09 NOTE — ED Notes (Signed)
Received care of this patient from [place]. Per patient she had a fall this morning and C/O left leg/hip pain. Pt alert and oriented x 4.

## 2021-12-09 NOTE — Assessment & Plan Note (Addendum)
Patient with history of osteoporosis presented status post mechanical fall. X-ray showed evidence of left subcapital femoral fracture. Orthopedic consulted.  Patient underwent ORIF (left total hip arthroplasty) on 2/1 Postoperative day 5.  Mobilize out of bed with PT. Weightbearing as tolerated.  Lovenox for DVT prophylaxis in hospital. Aspirin 81 mg twice daily for DVT prophylaxis for 6 weeks after discharge. Hold Prolia for at least 6 months postoperatively. Hold Humira for 2 to 4 weeks postoperatively depending upon healing of her incision. Follow-up outpatient in 2 weeks for routine postoperative care. Patient being discharged to SNF today.

## 2021-12-09 NOTE — Assessment & Plan Note (Addendum)
Continue home medications. Mood remains stable.

## 2021-12-09 NOTE — Assessment & Plan Note (Addendum)
Continue carvedilol, losartan and clonidine. Resume hydralazine if blood pressure remains elevated.

## 2021-12-09 NOTE — Progress Notes (Signed)
Patient discussed with EDP at [facility].  Will plan to transfer to [facility].  Will plan for OR 12/10/2021 with Dr. Rod Can.    - [facility] LLE - NPO at midnight for OR 12/10/2021 - Hold anticoagulation for OR 12/10/2021

## 2021-12-09 NOTE — Assessment & Plan Note (Addendum)
Continue Colestipol

## 2021-12-09 NOTE — Assessment & Plan Note (Addendum)
Patient reported prior history of TIA/CVA.  MRI did not show prior stroke. Patient stopped taking Plavix on her own 6 months ago. Resume aspirin 81 mg twice daily for 6 weeks and then daily

## 2021-12-09 NOTE — ED Notes (Signed)
Patient transported to X-ray 

## 2021-12-09 NOTE — Assessment & Plan Note (Addendum)
Continue gabapentin.

## 2021-12-09 NOTE — Progress Notes (Signed)
Patient presented to the ER at [facility] after a ground-level fall at home with left hip pain and inability to weight-bear.  X-rays of the left hip and femur showed a displaced subcapital left femoral neck fracture with severe left knee arthritis.  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  I was asked to take over care by Dr. Georgeanna Harrison due to my expertise in adult reconstruction.  She requires a left total hip arthroplasty for pain control and to allow immediate mobilization out of bed.  The patient has been seen by [facility] for admission at [facility].  This transfer needs to be completed sometime today.  Surgery will be tomorrow.  N.p.o. after midnight.  Hold chemical DVT prophylaxis.  Full consult tomorrow.

## 2021-12-09 NOTE — Assessment & Plan Note (Addendum)
There is no obvious visible bleeding. H&H remains reasonably stable.

## 2021-12-09 NOTE — ED Notes (Signed)
Report called to charge nurse at [facility] and report  to [facility]

## 2021-12-09 NOTE — ED Provider Notes (Signed)
[facility] Provider Note   CSN: [id number] Arrival date & time: 12/09/21  1058     History  CC: Left hip pain   Nancy [name] is a 81 y.o. female presenting to the ED by EMS with a fall at home onto her left hip.  The patient ports that she fell getting out of bed last night and landed on her left hip.  She had difficulty getting off the ground all night.  She was able to crawl to the phone to call family who then called EMS this morning.  She has not been able to walk on the hip.   She adamantly denies that she struck her head after the fall last night.  Her pain is moderate while lying still, severe with movement.  EMS did not give any medications in route to the hospital.  Patient reports that she lives independently at baseline, she does have a walker at home but does not generally need to use it to get around.  She denies that she has never had a hip fracture before and does not have an orthopedic surgeon per her recollection.  The patient reports that she is no longer taking any blood thinners including aspirin or Plavix, which she had been on for a while after stroke.  She stopped taking these medications many months ago.  HPI     Home Medications Prior to Admission medications   Medication Sig Start Date End Date Taking? Authorizing Provider  acetaminophen (TYLENOL) 500 MG tablet Take 500 mg by mouth every 6 (six) hours as needed for moderate pain.    [provider]  Adalimumab 40 MG/0.4ML PNKT Inject 40 mg into the skin once a week. Saturday    [provider]  albuterol (VENTOLIN HFA) 108 (90 Base) MCG/ACT inhaler Inhale 2 puffs into the lungs every 6 (six) hours as needed for wheezing or shortness of breath. 10/10/21   Debbrah Alar, NP  carvedilol (COREG) 25 MG tablet TAKE 1 TABLET BY MOUTH TWICE A DAY 07/24/21   Debbrah Alar, NP  cloNIDine (CATAPRES) 0.1 MG tablet TAKE 1 TABLET BY MOUTH 3 TIMES DAILY. 08/01/21    Debbrah Alar, NP  clopidogrel (PLAVIX) 75 MG tablet TAKE 1 TABLET BY MOUTH EVERY DAY 07/24/21   Debbrah Alar, NP  colestipol (COLESTID) 1 g tablet TAKE 1 TABLET BY MOUTH EVERY DAY 05/08/21   Nandigam, Venia Minks, MD  fluticasone (FLONASE) 50 MCG/ACT nasal spray Place 1 spray into both nostrils daily as needed for allergies or rhinitis.    [provider]  gabapentin (NEURONTIN) 100 MG capsule Take 1 capsule (100 mg total) by mouth 3 (three) times daily. 11/14/21   Debbrah Alar, NP  hydrALAZINE (APRESOLINE) 25 MG tablet TAKE 1 TABLET BY MOUTH THREE TIMES A DAY 09/05/21   Debbrah Alar, NP  leflunomide (ARAVA) 20 MG tablet Take 1 tablet (20 mg total) by mouth daily. 02/20/20   Debbrah Alar, NP  losartan (COZAAR) 100 MG tablet TAKE 1 TABLET BY MOUTH EVERY DAY 08/01/21   Debbrah Alar, NP  mycophenolate (CELLCEPT) 500 MG tablet Take 500 mg by mouth daily. 12/12/18   [provider]  polyethylene glycol powder (GLYCOLAX/MIRALAX) 17 GM/SCOOP powder Take 17 g by mouth daily. 09/24/21   Zehr, Laban Emperor, PA-C  prednisoLONE acetate (PRED FORTE) 1 % ophthalmic suspension Place 1 drop into both eyes daily.     [provider]  predniSONE (DELTASONE) 5 MG tablet Take 5 mg by  mouth daily as needed.    [provider]  PROLIA 60 MG/ML SOSY injection Inject 60 mg as directed every 6 (six) months. 05/11/18   [provider]  rosuvastatin (CRESTOR) 40 MG tablet TAKE 1 TABLET BY MOUTH EVERY DAY 06/09/21   Debbrah Alar, NP      Allergies    Iron and Norvasc [amlodipine]    Review of Systems   Review of Systems  Physical Exam Updated Vital Signs BP (!) 180/61    Pulse 68    Temp 98.2 F (36.8 C) (Oral)    Resp 17    Ht 5\' 3"  (1.6 m)    Wt 52.2 kg    SpO2 96%    BMI 20.37 kg/m  Physical Exam Constitutional:      General: She is not in acute distress. HENT:     Head: Normocephalic and atraumatic.  Eyes:     Conjunctiva/sclera:  Conjunctivae normal.     Pupils: Pupils are equal, round, and reactive to light.  Cardiovascular:     Rate and Rhythm: Normal rate and regular rhythm.     Pulses: Normal pulses.  Pulmonary:     Effort: Pulmonary effort is normal. No respiratory distress.  Musculoskeletal:     Comments: Left hip pain with range of motion testing, limited by pain.  No visible deformity or shortening or inversion of the left lower extremity.  Skin:    General: Skin is warm and dry.  Neurological:     General: No focal deficit present.     Mental Status: She is alert. Mental status is at baseline.  Psychiatric:        Mood and Affect: Mood normal.        Behavior: Behavior normal.    ED Results / Procedures / Treatments   Labs (all labs ordered are listed, but only abnormal results are displayed) Labs Reviewed  BASIC METABOLIC PANEL - Abnormal; Notable for the following components:      Result Value   Glucose, Bld 107 (*)    All other components within normal limits  CBC - Abnormal; Notable for the following components:   WBC 13.3 (*)    Hemoglobin 11.9 (*)    RDW 15.8 (*)    All other components within normal limits  RESP PANEL BY RT-PCR (FLU A&B, COVID) [id number]    EKG None  Radiology DG Chest 1 View  Result Date: 12/09/2021 CLINICAL DATA:  Fall. EXAM: CHEST  1 VIEW COMPARISON:  August 22, 2015. FINDINGS: The heart size and mediastinal contours are within normal limits. Both lungs are clear. The visualized skeletal structures are unremarkable. IMPRESSION: No active disease. Electronically Signed   By: Marijo Conception M.D.   On: 12/09/2021 11:56   DG HIP UNILAT WITH PELVIS 2-3 VIEWS LEFT  Result Date: 12/09/2021 CLINICAL DATA:  Fall, left hip pain EXAM: DG HIP (WITH OR WITHOUT PELVIS) 2-3V LEFT; LEFT FEMUR 2 VIEWS COMPARISON:  CT abdomen/pelvis 03/27/2020 FINDINGS: Hip: There is a minimally displaced subcapital left femoral neck fracture. The femoral head remains seated within the acetabulum.  No fracture is seen on the right. The SI joints and symphysis pubis are intact. There is mild degenerative change about both hips. Femur: No other fracture is seen. Knee alignment is maintained. There is degenerative change of the knee joint, primarily affecting the medial compartment. The soft tissues are unremarkable. IMPRESSION: Minimally displaced subcapital left femoral neck fracture. Electronically Signed   By: Court Joy.D.  On: 12/09/2021 11:56   DG FEMUR MIN 2 VIEWS LEFT  Result Date: 12/09/2021 CLINICAL DATA:  Fall, left hip pain EXAM: DG HIP (WITH OR WITHOUT PELVIS) 2-3V LEFT; LEFT FEMUR 2 VIEWS COMPARISON:  CT abdomen/pelvis 03/27/2020 FINDINGS: Hip: There is a minimally displaced subcapital left femoral neck fracture. The femoral head remains seated within the acetabulum. No fracture is seen on the right. The SI joints and symphysis pubis are intact. There is mild degenerative change about both hips. Femur: No other fracture is seen. Knee alignment is maintained. There is degenerative change of the knee joint, primarily affecting the medial compartment. The soft tissues are unremarkable. IMPRESSION: Minimally displaced subcapital left femoral neck fracture. Electronically Signed   By: Valetta Mole M.D.   On: 12/09/2021 11:56    Procedures Procedures    Medications Ordered in ED Medications  morphine 4 MG/ML injection 4 mg (has no administration in time range)  fentaNYL (SUBLIMAZE) injection 75 mcg (75 mcg Intravenous Given 12/09/21 1154)    ED Course/ Medical Decision Making/ A&P Clinical Course as of 12/09/21 1315  Tue Dec 09, 2021  1211 DG FEMUR MIN 2 VIEWS LEFT [MT]  1212 Patient here with a femoral neck fracture per my review of the x-rays.  We will place consult orthopedics [MT]    Clinical Course User Index [MT] Arcelia Pals, Carola Rhine, MD                           Medical Decision Making Amount and/or Complexity of Data Reviewed Labs: ordered. Radiology: ordered.  Decision-making details documented in ED Course.  Risk Prescription drug management. Decision regarding hospitalization.   Patient is here with a mechanical fall, found to have a closed left femoral neck fracture.  Neurovascularly intact.  IV fentanyl given for pain with some relief.  Her pain is minimal while lying still.  Labs reviewed showing no significant abnormalities.   I spoke to Dr Alvan Dame orthopedics at [facility] who does feel that this would likely need a surgical intervention, advised that we attempt to transfer the patient as soon as possible, possibly ER to ER if needed, as they would like to evaluate the patient for possible operation even tonight.  If not the patient would be n.p.o. at midnight.  I spoke to bed management -there are no beds available at [facility] at this time.  Therefore I have spoken to the ER provider Dr Pattricia Boss regarding an ER to ER transfer, and she is in agreement with this plan.  I have also spoken to the hospitalist for admission, Dr Olevia Bowens, who has placed a bed request.  He asks that the [facility] ED team page the admitting service for admission upon the patient's arrival, as there may be a different hospitalist working then.  Both the patient and her daughter at bedside were updated regarding the transfer process and the need for likely surgery.  I confirmed that she is not currently taking any blood thinners and that  she has been n.p.o. since this morning.  1:15  - awaiting [facility] transportation to [facility]         Final Clinical Impression(s) / ED Diagnoses Final diagnoses:  Closed fracture of neck of left femur, initial encounter [facility])    Rx / [place] Orders ED Discharge Orders     None         Wyvonnia Dusky, MD 12/09/21 1315

## 2021-12-09 NOTE — ED Triage Notes (Signed)
States was in kitchen today and turned around and fell to ground. C/o pain to left hip/pelvis/femur. States crawled to bedroom after fall. Denies hitting head/LOC. States hasnt taken blood thinner "in a while".

## 2021-12-09 NOTE — Progress Notes (Signed)
Plan of care note  Plan of Care Note for accepted transfer  Patient: Nancy [name]    TDS:[id number]  DOA: 12/09/2021     Facility requesting transfer: [facility] Requesting Provider: Octaviano Glow, MD Reason for transfer: Left subcapital femoral neck fracture. Facility course: 81 year old female with a past medical history of hypertension, osteoporosis, hypothyroidism, iron deficiency anemia, history of other nonhemorrhagic CVA, depression, B12 deficiency, rheumatoid arthritis, history of  tobacco abuse who is coming to [facility] ED from [place] due to left hip fracture.  Orthopedic surgery is planning to perform surgery today.  Accepted to telemetry bed.  Plan of care: The patient is accepted for admission to Telemetry unit, at [facility].   Author: Reubin Milan, MD  12/09/2021  Check [email or web address] for on-call coverage.  Nursing staff, Please call [place] [facility] number on Amion as soon as patient's arrival, so appropriate admitting provider can evaluate the pt.

## 2021-12-10 ENCOUNTER — Encounter (HOSPITAL_COMMUNITY): Payer: Self-pay | Admitting: *Deleted

## 2021-12-10 ENCOUNTER — Other Ambulatory Visit: Payer: Self-pay

## 2021-12-10 ENCOUNTER — Inpatient Hospital Stay (HOSPITAL_COMMUNITY): Payer: Medicare Other | Admitting: Anesthesiology

## 2021-12-10 ENCOUNTER — Inpatient Hospital Stay (HOSPITAL_COMMUNITY): Payer: Medicare Other

## 2021-12-10 ENCOUNTER — Encounter (HOSPITAL_COMMUNITY)
Admission: EM | Disposition: A | Discharge status: Skilled Nursing Facility | Payer: Self-pay | Source: Home / Self Care | Attending: Family Medicine

## 2021-12-10 DIAGNOSIS — S72002A Fracture of unspecified part of neck of left femur, initial encounter for closed fracture: Secondary | ICD-10-CM | POA: Diagnosis not present

## 2021-12-10 DIAGNOSIS — I10 Essential (primary) hypertension: Secondary | ICD-10-CM | POA: Diagnosis not present

## 2021-12-10 DIAGNOSIS — Z8673 Personal history of transient ischemic attack (TIA), and cerebral infarction without residual deficits: Secondary | ICD-10-CM | POA: Diagnosis not present

## 2021-12-10 HISTORY — PX: TOTAL HIP ARTHROPLASTY: SHX124

## 2021-12-10 LAB — CBC
HCT: 37.2 % (ref 36.0–46.0)
Hemoglobin: 11.4 g/dL — ABNORMAL LOW (ref 12.0–15.0)
MCH: 28 pg (ref 26.0–34.0)
MCHC: 30.6 g/dL (ref 30.0–36.0)
MCV: 91.4 fL (ref 80.0–100.0)
Platelets: 194 10*3/uL (ref 150–400)
RBC: 4.07 MIL/uL (ref 3.87–5.11)
RDW: 15.8 % — ABNORMAL HIGH (ref 11.5–15.5)
WBC: 10.6 10*3/uL — ABNORMAL HIGH (ref 4.0–10.5)
nRBC: 0 % (ref 0.0–0.2)

## 2021-12-10 LAB — ABO/RH: ABO/RH(D): AB POS

## 2021-12-10 LAB — BASIC METABOLIC PANEL WITH GFR
Anion gap: 9 (ref 5–15)
BUN: 18 mg/dL (ref 8–23)
CO2: 25 mmol/L (ref 22–32)
Calcium: 9.2 mg/dL (ref 8.9–10.3)
Chloride: 99 mmol/L (ref 98–111)
Creatinine, Ser: 0.84 mg/dL (ref 0.44–1.00)
GFR, Estimated: >60 mL/min (ref >60)
Glucose, Bld: 118 mg/dL — ABNORMAL HIGH (ref 70–99)
Potassium: 4.7 mmol/L (ref 3.5–5.1)
Sodium: 133 mmol/L — ABNORMAL LOW (ref 135–145)

## 2021-12-10 SURGERY — ARTHROPLASTY, HIP, TOTAL, ANTERIOR APPROACH
Anesthesia: Monitor Anesthesia Care | Site: Hip | Laterality: Left

## 2021-12-10 MED ORDER — FENTANYL CITRATE (PF) 100 MCG/2ML IJ SOLN
INTRAMUSCULAR | Status: AC
  Filled 2021-12-10: qty 2

## 2021-12-10 MED ORDER — DOCUSATE SODIUM 100 MG PO CAPS: 100.0000 mg | ORAL_CAPSULE | Freq: Two times a day (BID) | ORAL | Status: DC

## 2021-12-10 MED ORDER — SUGAMMADEX SODIUM 200 MG/2ML IV SOLN
INTRAVENOUS | Status: DC | PRN
  Administered 2021-12-10: 120 mg via INTRAVENOUS

## 2021-12-10 MED ORDER — PHENYLEPHRINE 40 MCG/ML (10ML) SYRINGE FOR IV PUSH (FOR BLOOD PRESSURE SUPPORT)
PREFILLED_SYRINGE | INTRAVENOUS | Status: AC
  Filled 2021-12-10: qty 10

## 2021-12-10 MED ORDER — TRANEXAMIC ACID-NACL 1000-0.7 MG/100ML-% IV SOLN
1000.0000 mg | Freq: Once | INTRAVENOUS | Status: AC
  Administered 2021-12-10: 1000 mg via INTRAVENOUS
  Filled 2021-12-10: qty 100

## 2021-12-10 MED ORDER — PHENOL 1.4 % MT LIQD: 1.0000 | OROMUCOSAL | Status: DC | PRN

## 2021-12-10 MED ORDER — POVIDONE-IODINE 10 % EX SWAB: 2.0000 "application " | Freq: Once | CUTANEOUS | Status: DC

## 2021-12-10 MED ORDER — ISOPROPYL ALCOHOL 70 % SOLN
Status: DC | PRN
  Administered 2021-12-10: 1 via TOPICAL

## 2021-12-10 MED ORDER — SODIUM CHLORIDE 0.9 % IR SOLN
Status: DC | PRN
  Administered 2021-12-10 (×2): 1000 mL

## 2021-12-10 MED ORDER — WATER FOR IRRIGATION, STERILE IR SOLN
Status: DC | PRN
  Administered 2021-12-10: 2000 mL

## 2021-12-10 MED ORDER — FENTANYL CITRATE PF 50 MCG/ML IJ SOSY: 25.0000 ug | PREFILLED_SYRINGE | INTRAMUSCULAR | Status: DC | PRN

## 2021-12-10 MED ORDER — EPHEDRINE SULFATE-NACL 50-0.9 MG/10ML-% IV SOSY
PREFILLED_SYRINGE | INTRAVENOUS | Status: DC | PRN
Start: 2021-12-10 — End: 2021-12-10
  Administered 2021-12-10 (×2): 10 mg via INTRAVENOUS

## 2021-12-10 MED ORDER — DEXAMETHASONE SODIUM PHOSPHATE 10 MG/ML IJ SOLN
INTRAMUSCULAR | Status: DC | PRN
  Administered 2021-12-10: 8 mg via INTRAVENOUS

## 2021-12-10 MED ORDER — PROPOFOL 10 MG/ML IV BOLUS
INTRAVENOUS | Status: DC | PRN
  Administered 2021-12-10: 100 mg via INTRAVENOUS

## 2021-12-10 MED ORDER — FENTANYL CITRATE (PF) 100 MCG/2ML IJ SOLN
INTRAMUSCULAR | Status: DC | PRN
  Administered 2021-12-10 (×4): 50 ug via INTRAVENOUS

## 2021-12-10 MED ORDER — ONDANSETRON HCL 4 MG/2ML IJ SOLN
INTRAMUSCULAR | Status: AC
  Filled 2021-12-10: qty 2

## 2021-12-10 MED ORDER — DEXAMETHASONE SODIUM PHOSPHATE 10 MG/ML IJ SOLN
INTRAMUSCULAR | Status: AC
  Filled 2021-12-10: qty 1

## 2021-12-10 MED ORDER — PHENYLEPHRINE 40 MCG/ML (10ML) SYRINGE FOR IV PUSH (FOR BLOOD PRESSURE SUPPORT)
PREFILLED_SYRINGE | INTRAVENOUS | Status: DC | PRN
  Administered 2021-12-10: 120 ug via INTRAVENOUS
  Administered 2021-12-10: 40 ug via INTRAVENOUS

## 2021-12-10 MED ORDER — PROPOFOL 10 MG/ML IV BOLUS
INTRAVENOUS | Status: AC
  Filled 2021-12-10: qty 20

## 2021-12-10 MED ORDER — MENTHOL 3 MG MT LOZG: 1.0000 | LOZENGE | OROMUCOSAL | Status: DC | PRN

## 2021-12-10 MED ORDER — KETOROLAC TROMETHAMINE 30 MG/ML IJ SOLN
INTRAMUSCULAR | Status: DC | PRN
  Administered 2021-12-10: 30 mg

## 2021-12-10 MED ORDER — ENSURE SURGERY PO LIQD
237.0000 mL | ORAL | Status: DC
  Administered 2021-12-11 – 2021-12-15 (×5): 237 mL via ORAL
  Filled 2021-12-10 (×5): qty 237

## 2021-12-10 MED ORDER — CHLORHEXIDINE GLUCONATE CLOTH 2 % EX PADS
6.0000 | MEDICATED_PAD | Freq: Every day | CUTANEOUS | Status: DC
  Administered 2021-12-10 – 2021-12-13 (×4): 6 via TOPICAL

## 2021-12-10 MED ORDER — EPHEDRINE 5 MG/ML INJ
INTRAVENOUS | Status: AC
  Filled 2021-12-10: qty 5

## 2021-12-10 MED ORDER — SODIUM CHLORIDE (PF) 0.9 % IJ SOLN
INTRAMUSCULAR | Status: DC | PRN
  Administered 2021-12-10: 30 mL

## 2021-12-10 MED ORDER — ENOXAPARIN SODIUM 40 MG/0.4ML IJ SOSY
40.0000 mg | PREFILLED_SYRINGE | INTRAMUSCULAR | Status: DC
  Administered 2021-12-11 – 2021-12-15 (×5): 40 mg via SUBCUTANEOUS
  Filled 2021-12-10 (×5): qty 0.4

## 2021-12-10 MED ORDER — METHOCARBAMOL 500 MG PO TABS: 500.0000 mg | ORAL_TABLET | Freq: Four times a day (QID) | ORAL | Status: DC | PRN

## 2021-12-10 MED ORDER — LACTATED RINGERS IV SOLN: INTRAVENOUS | Status: DC

## 2021-12-10 MED ORDER — ROCURONIUM BROMIDE 100 MG/10ML IV SOLN
INTRAVENOUS | Status: DC | PRN
  Administered 2021-12-10: 50 mg via INTRAVENOUS

## 2021-12-10 MED ORDER — CEFAZOLIN SODIUM-DEXTROSE 2-4 GM/100ML-% IV SOLN
2.0000 g | Freq: Four times a day (QID) | INTRAVENOUS | Status: AC
  Administered 2021-12-10 – 2021-12-11 (×2): 2 g via INTRAVENOUS
  Filled 2021-12-10 (×2): qty 100

## 2021-12-10 MED ORDER — SODIUM CHLORIDE 0.9 % IV SOLN: INTRAVENOUS | Status: DC

## 2021-12-10 MED ORDER — METOCLOPRAMIDE HCL 5 MG/ML IJ SOLN: 5.0000 mg | Freq: Three times a day (TID) | INTRAMUSCULAR | Status: DC | PRN

## 2021-12-10 MED ORDER — ONDANSETRON HCL 4 MG PO TABS: 4.0000 mg | ORAL_TABLET | Freq: Four times a day (QID) | ORAL | Status: DC | PRN

## 2021-12-10 MED ORDER — BUPIVACAINE-EPINEPHRINE 0.25% -1:200000 IJ SOLN
INTRAMUSCULAR | Status: DC | PRN
  Administered 2021-12-10: 30 mL

## 2021-12-10 MED ORDER — TRANEXAMIC ACID-NACL 1000-0.7 MG/100ML-% IV SOLN
1000.0000 mg | INTRAVENOUS | Status: DC
  Filled 2021-12-10: qty 100

## 2021-12-10 MED ORDER — CEFAZOLIN SODIUM-DEXTROSE 2-4 GM/100ML-% IV SOLN
2.0000 g | INTRAVENOUS | Status: AC
  Administered 2021-12-10: 2 g via INTRAVENOUS
  Filled 2021-12-10: qty 100

## 2021-12-10 MED ORDER — ONDANSETRON HCL 4 MG/2ML IJ SOLN
4.0000 mg | Freq: Four times a day (QID) | INTRAMUSCULAR | Status: DC | PRN
  Administered 2021-12-10: 4 mg via INTRAVENOUS
  Filled 2021-12-10: qty 2

## 2021-12-10 MED ORDER — PROPOFOL 1000 MG/100ML IV EMUL
INTRAVENOUS | Status: AC
  Filled 2021-12-10: qty 100

## 2021-12-10 MED ORDER — ONDANSETRON HCL 4 MG/2ML IJ SOLN
INTRAMUSCULAR | Status: DC | PRN
Start: 2021-12-10 — End: 2021-12-10
  Administered 2021-12-10: 4 mg via INTRAVENOUS

## 2021-12-10 MED ORDER — METHOCARBAMOL 500 MG IVPB - SIMPLE MED
500.0000 mg | Freq: Four times a day (QID) | INTRAVENOUS | Status: DC | PRN
Start: 2021-12-10 — End: 2021-12-15
  Filled 2021-12-10: qty 50

## 2021-12-10 MED ORDER — METOCLOPRAMIDE HCL 5 MG PO TABS: 5.0000 mg | ORAL_TABLET | Freq: Three times a day (TID) | ORAL | Status: DC | PRN

## 2021-12-10 SURGICAL SUPPLY — 62 items
BAG COUNTER SPONGE SURGICOUNT (BAG) ×1 IMPLANT
BAG DECANTER FOR FLEXI CONT (MISCELLANEOUS) ×1 IMPLANT
BAG ZIPLOCK 12X15 (MISCELLANEOUS) ×1 IMPLANT
CHLORAPREP W/TINT 26 (MISCELLANEOUS) ×2 IMPLANT
COVER PERINEAL POST (MISCELLANEOUS) ×2 IMPLANT
COVER SURGICAL LIGHT HANDLE (MISCELLANEOUS) ×2 IMPLANT
DERMABOND ADVANCED (GAUZE/BANDAGES/DRESSINGS) ×1
DERMABOND ADVANCED .7 DNX12 (GAUZE/BANDAGES/DRESSINGS) ×2 IMPLANT
DRAPE IMP U-DRAPE 54X76 (DRAPES) ×2 IMPLANT
DRAPE SHEET LG 3/4 BI-LAMINATE (DRAPES) ×6 IMPLANT
DRAPE STERI IOBAN 125X83 (DRAPES) ×2 IMPLANT
DRAPE U-SHAPE 47X51 STRL (DRAPES) ×4 IMPLANT
DRSG AQUACEL AG ADV 3.5X10 (GAUZE/BANDAGES/DRESSINGS) ×2 IMPLANT
ELECT REM PT RETURN 15FT ADLT (MISCELLANEOUS) ×2 IMPLANT
GAUZE 4X4 16PLY ~~LOC~~+RFID DBL (SPONGE) ×1 IMPLANT
GAUZE SPONGE 4X4 12PLY STRL (GAUZE/BANDAGES/DRESSINGS) ×2 IMPLANT
GLOVE SRG 8 PF TXTR STRL LF DI (GLOVE) ×1 IMPLANT
GLOVE SURG ENC MOIS LTX SZ8.5 (GLOVE) ×4 IMPLANT
GLOVE SURG ENC TEXT LTX SZ7.5 (GLOVE) ×4 IMPLANT
GLOVE SURG UNDER POLY LF SZ8 (GLOVE) ×1
GLOVE SURG UNDER POLY LF SZ8.5 (GLOVE) ×2 IMPLANT
GOWN SPEC L3 XXLG W/TWL (GOWN DISPOSABLE) ×2 IMPLANT
GOWN STRL REUS W/TWL XL LVL3 (GOWN DISPOSABLE) ×2 IMPLANT
HANDPIECE INTERPULSE COAX TIP (DISPOSABLE) ×1
HEAD FEM -3XOFST 36XMDLR (Head) IMPLANT
HEAD FEM -6XOFST 36XMDLR (Orthopedic Implant) IMPLANT
HEAD MODULAR 36MM (Head) ×1 IMPLANT
HEAD MODULAR 36MM (Orthopedic Implant) ×1 IMPLANT
HOLDER FOLEY CATH W/STRAP (MISCELLANEOUS) ×2 IMPLANT
HOOD PEEL AWAY FLYTE STAYCOOL (MISCELLANEOUS) ×6 IMPLANT
JET LAVAGE IRRISEPT WOUND (IRRIGATION / IRRIGATOR) ×2
KIT TURNOVER KIT A (KITS) ×1 IMPLANT
LAVAGE JET IRRISEPT WOUND (IRRIGATION / IRRIGATOR) ×1 IMPLANT
LINER ACETAB ~~LOC~~ D 36 (Liner) ×1 IMPLANT
MANIFOLD NEPTUNE II (INSTRUMENTS) ×2 IMPLANT
MARKER SKIN DUAL TIP RULER LAB (MISCELLANEOUS) ×2 IMPLANT
NDL SAFETY ECLIPSE 18X1.5 (NEEDLE) ×1 IMPLANT
NDL SPNL 18GX3.5 QUINCKE PK (NEEDLE) ×1 IMPLANT
NEEDLE HYPO 18GX1.5 SHARP (NEEDLE) ×1
NEEDLE SPNL 18GX3.5 QUINCKE PK (NEEDLE) ×2 IMPLANT
PACK ANTERIOR HIP CUSTOM (KITS) ×2 IMPLANT
PENCIL SMOKE EVACUATOR (MISCELLANEOUS) IMPLANT
SAW OSC TIP CART 19.5X105X1.3 (SAW) ×2 IMPLANT
SEALER BIPOLAR AQUA 6.0 (INSTRUMENTS) ×2 IMPLANT
SET HNDPC FAN SPRY TIP SCT (DISPOSABLE) ×1 IMPLANT
SHELL ACET G7 3H 50 SZD (Shell) ×1 IMPLANT
SPIKE FLUID TRANSFER (MISCELLANEOUS) ×2 IMPLANT
SPONGE T-LAP 18X18 ~~LOC~~+RFID (SPONGE) ×6 IMPLANT
STAPLER INSORB 30 2030 C-SECTI (MISCELLANEOUS) IMPLANT
STAPLER VISISTAT 35W (STAPLE) ×1 IMPLANT
STEM POROUS COATED 11X142 (Stem) ×1 IMPLANT
SUT MNCRL AB 3-0 PS2 18 (SUTURE) ×2 IMPLANT
SUT MON AB 2-0 CT1 36 (SUTURE) ×2 IMPLANT
SUT STRATAFIX PDO 1 14 VIOLET (SUTURE) ×1
SUT STRATFX PDO 1 14 VIOLET (SUTURE) ×1
SUT VIC AB 2-0 CT1 27 (SUTURE)
SUT VIC AB 2-0 CT1 TAPERPNT 27 (SUTURE) IMPLANT
SUTURE STRATFX PDO 1 14 VIOLET (SUTURE) ×1 IMPLANT
SYR 3ML LL SCALE MARK (SYRINGE) ×2 IMPLANT
TRAY FOLEY MTR SLVR 16FR STAT (SET/KITS/TRAYS/PACK) IMPLANT
TUBE SUCTION HIGH CAP CLEAR NV (SUCTIONS) ×2 IMPLANT
WATER STERILE IRR 1000ML POUR (IV SOLUTION) ×2 IMPLANT

## 2021-12-10 NOTE — Plan of Care (Signed)

## 2021-12-10 NOTE — Op Note (Signed)
OPERATIVE REPORT  SURGEON: Rod Can, MD   ASSISTANT: Nehemiah Massed, PA-C  PREOPERATIVE DIAGNOSIS: Displaced Left femoral neck fracture.   POSTOPERATIVE DIAGNOSIS: Displaced Left femoral neck fracture.   PROCEDURE: Left total hip arthroplasty, anterior approach.   IMPLANTS: Biomet Taperloc Reduced Distal stem, size 11x142 mm,  high offset. Biomet G7 OsseoTi Cup, size 50 mm, D. Biomet Vivacit-E liner, size 36 mm, D, neutral. Biomet metal head ball, size 36 - 3 mm.  ANESTHESIA:  General  ANTIBIOTICS: 2g ancef.  ESTIMATED BLOOD LOSS:-150 mL    DRAINS: None.  COMPLICATIONS: None   CONDITION: PACU - hemodynamically stable.   BRIEF CLINICAL NOTE: Nancy [name] is a 81 y.o. female with a displaced Left femoral neck fracture. The patient was admitted to the hospitalist service and underwent perioperative risk stratification and medical optimization. The risks, benefits, and alternatives to total hip arthroplasty were explained, and the patient elected to proceed.  PROCEDURE IN DETAIL: The patient was taken to the operating room and general anesthesia was induced on the hospital bed.  The patient was then positioned on the Hana table.  All bony prominences were well padded.  The hip was prepped and draped in the normal sterile surgical fashion.  A time-out was called verifying side and site of surgery. Antibiotics were given within 60 minutes of beginning the procedure.   Bikini incision was made, and the direct anterior approach to the hip was performed through the Hueter interval.  Lateral femoral circumflex vessels were treated with the Auqumantys. The anterior capsule was exposed and an inverted T capsulotomy was made.  Fracture hematoma was encountered and evacuated. The patient was found to have a comminuted Left subcapital femoral neck fracture.  I freshened the femoral neck cut with a saw.  I removed the femoral neck fragment.  A corkscrew was placed into the head and the head  was removed.  This was passed to the back table and was measured. The pubofemoral ligament was released subperiosteally to the lesser trochanter.  Acetabular exposure was achieved, and the pulvinar and labrum were excised. Sequential reaming of the acetabulum was then performed up to a size 49 mm reamer under direct visulization. A 50 mm cup was then opened and impacted into place at approximately 40 degrees of abduction and 20 degrees of anteversion. The final polyethylene liner was impacted into place and acetabular osteophytes were removed.    I then gained femoral exposure taking care to protect the abductors and greater trochanter.  This was performed using standard external rotation, extension, and adduction.  A cookie cutter was used to enter the femoral canal, and then the femoral canal finder was placed.  Sequential broaching was performed up to a size 11.  Calcar planer was used on the femoral neck remnant.  I placed a high offset neck and a trial head ball.  The hip was reduced.  Leg lengths and offset were checked fluoroscopically.  The hip was dislocated and trial components were removed.  The final implants were placed, and the hip was reduced.  Fluoroscopy was used to confirm component position and leg lengths.  At 90 degrees of external rotation and full extension, the hip was stable to an anterior directed force.   The wound was copiously irrigated with Irrisept solution and normal saline using pule lavage.  Marcaine solution was injected into the periarticular soft tissue.  The wound was closed in layers using #1 Stratafix for the fascia, 2-0 Vicryl for the subcutaneous fat, 2-0 Monocryl  for the deep dermal layer, and staples + Dermabond for the skin.  Once the glue was fully dried, an Aquacell Ag dressing was applied.  The patient was transported to the recovery room in stable condition.  Sponge, needle, and instrument counts were correct at the end of the case x2.  The patient tolerated the  procedure well and there were no known complications.  Please note that a surgical assistant was a medical necessity for this procedure to perform it in a safe and expeditious manner. Assistant was necessary to provide appropriate retraction of vital neurovascular structures, to prevent femoral fracture, and to allow for anatomic placement of the prosthesis.  POSTOPERATIVE PLAN: Postoperatively, the patient be readmitted to the hospitalist service.  Mobilize out of bed with PT.  Weightbearing as tolerated left lower extremity with a walker.  Begin Lovenox for DVT prophylaxis while in house, and discharge home on 6 weeks of aspirin 81 mg p.o. twice daily. Her Prolia will need to be held for 6 months postoperatively.  Hold Humira for 2 to 4 weeks postoperatively depending on healing of her incision.  Return to the office in 2 weeks for routine postoperative care.

## 2021-12-10 NOTE — Anesthesia Procedure Notes (Signed)
Procedure Name: Intubation Date/Time: 12/10/2021 2:49 PM Performed by: Niel Hummer, CRNA Pre-anesthesia Checklist: Patient identified, Emergency Drugs available, Suction available and Patient being monitored Patient Re-evaluated:Patient Re-evaluated prior to induction Oxygen Delivery Method: Circle system utilized Preoxygenation: Pre-oxygenation with 100% oxygen Induction Type: IV induction Ventilation: Mask ventilation without difficulty Laryngoscope Size: Mac and 4 Grade View: Grade I Tube type: Oral Tube size: 7.0 mm Number of attempts: 1 Airway Equipment and Method: Stylet Placement Confirmation: ETT inserted through vocal cords under direct vision, positive ETCO2 and breath sounds checked- equal and bilateral Secured at: 21 cm Tube secured with: Tape Dental Injury: Teeth and Oropharynx as per pre-operative assessment

## 2021-12-10 NOTE — Progress Notes (Signed)
Updated daughter Tye Maryland with patient's condition and surgery status.

## 2021-12-10 NOTE — Anesthesia Postprocedure Evaluation (Signed)
Anesthesia Post Note  Patient: Nancy [name]  Procedure(s) Performed: TOTAL HIP ARTHROPLASTY ANTERIOR APPROACH (Left: Hip)     Patient location during evaluation: PACU Anesthesia Type: General Level of consciousness: awake and alert Pain management: pain level controlled Vital Signs Assessment: post-procedure vital signs reviewed and stable Respiratory status: spontaneous breathing, nonlabored ventilation, respiratory function stable and patient connected to nasal cannula oxygen Cardiovascular status: blood pressure returned to baseline and stable Postop Assessment: no apparent nausea or vomiting Anesthetic complications: no   No notable events documented.  Last Vitals:  Vitals:   12/10/21 1715 12/10/21 1742  BP: (!) 140/49 (!) 158/55  Pulse: (!) 58 60  Resp: 13 16  Temp: 36.7 C 36.5 C  SpO2: 97% 92%    Last Pain:  Vitals:   12/10/21 1742  TempSrc: Oral  PainSc:                  March Rummage Berlynn Warsame

## 2021-12-10 NOTE — Discharge Instructions (Signed)
? ?Dr. Dmario Russom ?Joint Replacement Specialist ?[facility] ?[address] ?[place], [place] [address] ?[phone] ? ? ?TOTAL HIP REPLACEMENT POSTOPERATIVE DIRECTIONS ? ? ? ?Hip Rehabilitation, Guidelines Following Surgery  ? ?WEIGHT BEARING ?Weight bearing as tolerated with assist device (walker, cane, etc) as directed, use it as long as suggested by your surgeon or therapist, typically at least 4-6 weeks. ? ?The results of a hip operation are greatly improved after range of motion and muscle strengthening exercises. Follow all safety measures which are given to protect your hip. If any of these exercises cause increased pain or swelling in your joint, decrease the amount until you are comfortable again. Then slowly increase the exercises. Call your caregiver if you have problems or questions.  ? ?HOME CARE INSTRUCTIONS  ?Most of the following instructions are designed to prevent the dislocation of your new hip.  ?Remove items at home which could result in a fall. This includes throw rugs or furniture in walking pathways.  ?Continue medications as instructed at time of discharge. ?You may have some home medications which will be placed on hold until you complete the course of blood thinner medication. ?You may start showering once you are discharged home. Do not remove your dressing. ?Do not put on socks or shoes without following the instructions of your caregivers.   ?Sit on chairs with arms. Use the chair arms to help push yourself up when arising.  ?Arrange for the use of a toilet seat elevator so you are not sitting low.  ?Walk with walker as instructed.  ?You may resume a sexual relationship in one month or when given the OK by your caregiver.  ?Use walker as long as suggested by your caregivers.  ?You may put full weight on your legs and walk as much as is comfortable. ?Avoid periods of inactivity such as sitting longer than an hour when not asleep. This helps prevent blood  clots.  ?You may return to work once you are cleared by your surgeon.  ?Do not drive a car for 6 weeks or until released by your surgeon.  ?Do not drive while taking narcotics.  ?Wear elastic stockings for two weeks following surgery during the day but you may remove then at night.  ?Make sure you keep all of your appointments after your operation with all of your doctors and caregivers. You should call the office at the above phone number and make an appointment for approximately two weeks after the date of your surgery. ?Please pick up a stool softener and laxative for home use as long as you are requiring pain medications. ?ICE to the affected hip every three hours for 30 minutes at a time and then as needed for pain and swelling. Continue to use ice on the hip for pain and swelling from surgery. You may notice swelling that will progress down to the foot and ankle.  This is normal after surgery.  Elevate the leg when you are not up walking on it.   ?It is important for you to complete the blood thinner medication as prescribed by your doctor. ?Continue to use the breathing machine which will help keep your temperature down.  It is common for your temperature to cycle up and down following surgery, especially at night when you are not up moving around and exerting yourself.  The breathing machine keeps your lungs expanded and your temperature down. ? ?RANGE OF MOTION AND STRENGTHENING EXERCISES  ?These exercises are designed to help you   keep full movement of your hip joint. Follow your caregiver's or physical therapist's instructions. Perform all exercises about fifteen times, three times per day or as directed. Exercise both hips, even if you have had only one joint replacement. These exercises can be done on a training (exercise) mat, on the floor, on a table or on a bed. Use whatever works the best and is most comfortable for you. Use music or television while you are exercising so that the exercises are a  pleasant break in your day. This will make your life better with the exercises acting as a break in routine you can look forward to.  ?Lying on your back, slowly slide your foot toward your buttocks, raising your knee up off the floor. Then slowly slide your foot back down until your leg is straight again.  ?Lying on your back spread your legs as far apart as you can without causing discomfort.  ?Lying on your side, raise your upper leg and foot straight up from the floor as far as is comfortable. Slowly lower the leg and repeat.  ?Lying on your back, tighten up the muscle in the front of your thigh (quadriceps muscles). You can do this by keeping your leg straight and trying to raise your heel off the floor. This helps strengthen the largest muscle supporting your knee.  ?Lying on your back, tighten up the muscles of your buttocks both with the legs straight and with the knee bent at a comfortable angle while keeping your heel on the floor.  ? ?SKILLED REHAB INSTRUCTIONS: ?If the patient is transferred to a skilled rehab facility following release from the hospital, a list of the current medications will be sent to the facility for the patient to continue.  When discharged from the skilled rehab facility, please have the facility set up the patient's Home Health Physical Therapy prior to being released. Also, the skilled facility will be responsible for providing the patient with their medications at time of release from the facility to include their pain medication and their blood thinner medication. If the patient is still at the rehab facility at time of the two week follow up appointment, the skilled rehab facility will also need to assist the patient in arranging follow up appointment in our office and any transportation needs. ? ?POST-OPERATIVE OPIOID TAPER INSTRUCTIONS: ?It is important to wean off of your opioid medication as soon as possible. If you do not need pain medication after your surgery it is ok  to stop day one. ?Opioids include: ?Codeine, Hydrocodone(Norco, Vicodin), Oxycodone(Percocet, oxycontin) and hydromorphone amongst others.  ?Long term and even short term use of opiods can cause: ?Increased pain response ?Dependence ?Constipation ?Depression ?Respiratory depression ?And more.  ?Withdrawal symptoms can include ?Flu like symptoms ?Nausea, vomiting ?And more ?Techniques to manage these symptoms ?Hydrate well ?Eat regular healthy meals ?Stay active ?Use relaxation techniques(deep breathing, meditating, yoga) ?Do Not substitute Alcohol to help with tapering ?If you have been on opioids for less than two weeks and do not have pain than it is ok to stop all together.  ?Plan to wean off of opioids ?This plan should start within one week post op of your joint replacement. ?Maintain the same interval or time between taking each dose and first decrease the dose.  ?Cut the total daily intake of opioids by one tablet each day ?Next start to increase the time between doses. ?The last dose that should be eliminated is the evening dose.  ? ? ?MAKE   SURE YOU:  ?Understand these instructions.  ?Will watch your condition.  ?Will get help right away if you are not doing well or get worse. ? ?Pick up stool softner and laxative for home use following surgery while on pain medications. ?Do not remove your dressing. ?The dressing is waterproof--it is OK to take showers. ?Continue to use ice for pain and swelling after surgery. ?Do not use any lotions or creams on the incision until instructed by your surgeon. ?Total Hip Protocol. ? ?

## 2021-12-10 NOTE — Progress Notes (Signed)
Chaplain engaged in an initial visit with Nancy [name].  Nancy [name] shared about her healthcare journey and family.  Nancy [name] is the mother of 8 children.  One of her sons has since passed.  She talked about having 8 children by the age of 21 and being married to a very traditional and conservative husband.  She voiced how difficult that time in her life was but that she depended God and her faith to get her through. Nancy [name] voiced that she loves her independence now and even though her children have offered to have her stay with them, she values being on her own.  She is well supported.   Chaplain offered listening, presence, support and a blessing over her before her procedure.    12/10/21 1300  Clinical Encounter Type  Visited With Patient  Visit Type Initial;Spiritual support

## 2021-12-10 NOTE — Progress Notes (Signed)
Initial Nutrition Assessment  DOCUMENTATION CODES:   Not applicable  INTERVENTION:  - diet advancement as medically feasible post-op. - will order Ensure Surgery once/day, each supplement provides 330 kcal and 18 grams of protein.   NUTRITION DIAGNOSIS:   Increased nutrient needs related to hip fracture, post-op healing as evidenced by estimated needs.  GOAL:   Patient will meet greater than or equal to 90% of their needs  MONITOR:   Diet advancement, PO intake, Supplement acceptance, Labs, Weight trends, Skin  REASON FOR ASSESSMENT:   Consult Hip fracture protocol  ASSESSMENT:   81 y.o. female with medical history significant of rheumatoid arthritis, TIA, anemia, HTN, HLD, depression, osteoporosis, and cancer. She presented to the ED after a fall in her kitchen. She was found to have closed L femur fracture.  Patient laying in bed with no visitors present at the time of RD visit. She has been NPO since midnight pending surgery later today.   Patient reports very good appetite PTA. She usually eats a large breakfast meal and large dinner meal and has a snack-like meal for lunch. She occasionally drinks Boost at home and is agreeable to receiving Ensure post-op.  She denies any chewing or swallowing issues. She has upper and lower dentures. She does not like wearing her dentures and reports that they are not overly comfortable. Sounds like they are a bit loose.   She reports losing weight about 20 lb 3 years ago and and she reports gaining weight more recently and that she now weighs 116 lb. Weight yesterday documented as 115 lb and weight appears to be mainly stable over the past 1 year--weight on 12/10/20 was 114 lb.    Labs reviewed; Na: 133 mmol/l. Medications reviewed; 100 mg colace BID.  IVF; NS @ 75 ml/hr.     NUTRITION - FOCUSED PHYSICAL EXAM:  Flowsheet Row Most Recent Value  Orbital Region No depletion  Upper Arm Region No depletion  Thoracic and Lumbar  Region Unable to assess  Buccal Region No depletion  Temple Region No depletion  Clavicle Bone Region No depletion  Clavicle and Acromion Bone Region No depletion  Scapular Bone Region No depletion  Dorsal Hand No depletion  Patellar Region No depletion  Anterior Thigh Region No depletion  Posterior Calf Region No depletion  Edema (RD Assessment) Mild  [BLE]  Hair Reviewed  Eyes Reviewed  Mouth Reviewed  Skin Reviewed  Nails Reviewed       Diet Order:   Diet Order             Diet NPO time specified Except for: Sips with Meds  Diet effective midnight                   EDUCATION NEEDS:   Education needs have been addressed  Skin:  Skin Assessment: Reviewed RN Assessment  Last BM:  PTA/unknown  Height:   Ht Readings from Last 1 Encounters:  12/09/21 5\' 3"  (1.6 m)    Weight:   Wt Readings from Last 1 Encounters:  12/09/21 52.2 kg     Estimated Nutritional Needs:  Kcal:  1650-1900 kcal Protein:  80-95 grams Fluid:  >/= 1.8 L/day     Jarome Matin, MS, RD, LDN Inpatient Clinical Dietitian RD pager # available in [facility]  After hours/weekend pager # available in [facility]

## 2021-12-10 NOTE — Progress Notes (Signed)
Messaged left for daughter Tye Maryland regarding patient returning back from surgery.

## 2021-12-10 NOTE — Transfer of Care (Signed)
Immediate Anesthesia Transfer of Care Note  Patient: LISSETT FAVORITE  Procedure(s) Performed: TOTAL HIP ARTHROPLASTY ANTERIOR APPROACH (Left: Hip)  Patient Location: PACU  Anesthesia Type:General  Level of Consciousness: drowsy  Airway & Oxygen Therapy: Patient Spontanous Breathing and Patient connected to face mask oxygen  Post-op Assessment: Report given to RN, Post -op Vital signs reviewed and stable and Patient moving all extremities X 4  Post vital signs: Reviewed and stable  Last Vitals:  Vitals Value Taken Time  BP 129/44   Temp    Pulse 56   Resp 12 12/10/21 1643  SpO2 100   Vitals shown include unvalidated device data.  Last Pain:  Vitals:   12/10/21 1340  TempSrc: Oral  PainSc: 5       Patients Stated Pain Goal: 3 (58/30/74 6002)  Complications: No notable events documented.

## 2021-12-10 NOTE — Anesthesia Preprocedure Evaluation (Addendum)
Anesthesia Evaluation  Patient identified by MRN, date of birth, ID band Patient awake    Reviewed: Allergy & Precautions, H&P , NPO status , Patient's Chart, lab work & pertinent test results, reviewed documented beta blocker date and time   Airway Mallampati: II  TM Distance: >3 FB Neck ROM: full    Dental no notable dental hx. (+) Edentulous Upper, Edentulous Lower,    Pulmonary neg pulmonary ROS, Current Smoker and Patient abstained from smoking.,    Pulmonary exam normal breath sounds clear to auscultation       Cardiovascular Exercise Tolerance: Good hypertension, Pt. on medications negative cardio ROS   Rhythm:regular Rate:Normal     Neuro/Psych PSYCHIATRIC DISORDERS Anxiety Depression TIACVA    GI/Hepatic negative GI ROS, Neg liver ROS,   Endo/Other  Hypothyroidism   Renal/GU negative Renal ROS  negative genitourinary   Musculoskeletal  (+) Arthritis , Rheumatoid disorders,    Abdominal   Peds  Hematology negative hematology ROS (+) Blood dyscrasia, anemia ,   Anesthesia Other Findings   Reproductive/Obstetrics negative OB ROS                           Anesthesia Physical Anesthesia Plan  ASA: 3  Anesthesia Plan: General   Post-op Pain Management:    Induction: Intravenous  PONV Risk Score and Plan: 2 and Ondansetron and Treatment may vary due to age or medical condition  Airway Management Planned: Oral ETT and LMA  Additional Equipment: None  Intra-op Plan:   Post-operative Plan: Extubation in OR  Informed Consent: I have reviewed the patients History and Physical, chart, labs and discussed the procedure including the risks, benefits and alternatives for the proposed anesthesia with the patient or authorized representative who has indicated his/her understanding and acceptance.     Dental Advisory Given  Plan Discussed with: CRNA and  Anesthesiologist  Anesthesia Plan Comments: (  )      Anesthesia Quick Evaluation

## 2021-12-10 NOTE — Consult Note (Addendum)
ORTHOPAEDIC CONSULTATION  REQUESTING PHYSICIAN: Shawna Clamp, MD  PCP:  Debbrah Alar, NP  Chief Complaint: Left hip injury  HPI: Nancy [name] is a 81 y.o. female with a past medical history of kidney transplant, rheumatoid arthritis, TIA, uncontrolled hypertension, hyperlipidemia, and depression who lost her balance and fell at home.  She fell onto her left hip.  She had immediate hip pain and inability to weight-bear.  She was brought to the emergency department at [facility], where x-rays revealed a displaced left femoral neck fracture.  She was admitted by the hospitalist service for perioperative restratification and medical optimization.  Orthopedic consultation was placed for management of her left hip fracture.  She denies any other injuries.  She is extremely independent.  She ambulates without any assist device.  She drives and does her own grocery shopping.  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   Cataract    Colon polyps    CVA (cerebral vascular accident) ([place]) 02/2017   Depression    Hyperlipidemia    Hypertension    Osteoporosis 01/01/2015   Rheumatoid arteritis (HCC)    Thyroid disease    TIA (transient ischemic attack) 2016   Urinary incontinence    Past Surgical History:  Procedure Laterality Date   ABDOMINAL HYSTERECTOMY  1976   CATARACT EXTRACTION Bilateral    LOWER EXTREMITY ANGIOGRAPHY N/A 08/29/2019   Procedure: LOWER EXTREMITY ANGIOGRAPHY;  Surgeon: Serafina Mitchell, MD;  Location: [place] [facility];  Service: Cardiovascular;  Laterality: N/A;   PERIPHERAL VASCULAR INTERVENTION Right 08/29/2019   Procedure: PERIPHERAL VASCULAR INTERVENTION;  Surgeon: Serafina Mitchell, MD;  Location: [place] [facility];  Service: Cardiovascular;  Laterality: Right;   TUMOR REMOVAL  last was 1976   left ankle x3   Social History   Socioeconomic History   Marital status:  Divorced    Spouse name: Not on file   Number of children: 8   Years of education: Not on file   Highest education level: Not on file  Occupational History   Occupation: retired    Fish farm manager: RETIRED    Comment: Retired from [employer]   Smoking status: Some Days    Packs/day: 0.50    Years: 32.00    Pack years: 16.00    Types: Cigarettes   Smokeless tobacco: Never   Tobacco comments:    6 or 7 per day  Vaping Use   Vaping Use: Never used  Substance and Sexual Activity   Alcohol use: No    Alcohol/week: 0.0 standard drinks   Drug use: No   Sexual activity: Never  Other Topics Concern   Not on file  Social History Narrative   Lives alone   Right Handed    Drinks 6-8 cups caffeine daily   Social Determinants of Health   Financial Resource Strain: Low Risk    Difficulty of Paying Living Expenses: Not hard at all  Food Insecurity: No Food Insecurity   Worried About Charity fundraiser in the Last Year: Never true   Ran Out of Food in the Last Year: Never true  Transportation Needs: No Transportation Needs   Lack of Transportation (Medical): No   Lack of Transportation (Non-Medical): No  Physical Activity: Inactive   Days of Exercise per Week: 0 days   Minutes of Exercise per Session:  0 min  Stress: No Stress Concern Present   Feeling of Stress : Not at all  Social Connections: Moderately Isolated   Frequency of Communication with Friends and Family: More than three times a week   Frequency of Social Gatherings with Friends and Family: More than three times a week   Attends Religious Services: More than 4 times per year   Active Member of [employer] or Organizations: No   Attends Music therapist: Never   Marital Status: Divorced   Family History  Problem Relation Age of Onset   Hypertension Father    Colon cancer Father    Ulcerative colitis Mother    Parkinson's disease Mother    Heart disease Sister 35       CABG x 3   Cirrhosis  Brother    Alcohol abuse Brother    Colitis Daughter    Colon polyps Daughter    Heart attack Daughter    Hypertension Son    Kidney disease Son        transplant due to kidney problems after [place] Storm   Arthritis Other    Coronary artery disease Other    Hyperlipidemia Other    Hypertension Other    Stroke Sister        died 70   Arthritis Sister    Hypertension Sister    Thyroid disease Daughter        x 3   Cirrhosis Brother 42       ETOH abuse   Allergies  Allergen Reactions   Iron Nausea And Vomiting    Can take infusions    Prior to Admission medications   Medication Sig Start Date End Date Taking? Authorizing Provider  carvedilol (COREG) 25 MG tablet TAKE 1 TABLET BY MOUTH TWICE A DAY 07/24/21  Yes Debbrah Alar, NP  cloNIDine (CATAPRES) 0.1 MG tablet TAKE 1 TABLET BY MOUTH 3 TIMES DAILY. 08/01/21  Yes Debbrah Alar, NP  losartan (COZAAR) 100 MG tablet TAKE 1 TABLET BY MOUTH EVERY DAY 08/01/21  Yes Debbrah Alar, NP  acetaminophen (TYLENOL) 500 MG tablet Take 500 mg by mouth every 6 (six) hours as needed for moderate pain.    [provider]  Adalimumab 40 MG/0.4ML PNKT Inject 40 mg into the skin once a week. Saturday    [provider]  albuterol (VENTOLIN HFA) 108 (90 Base) MCG/ACT inhaler Inhale 2 puffs into the lungs every 6 (six) hours as needed for wheezing or shortness of breath. 10/10/21   Debbrah Alar, NP  colestipol (COLESTID) 1 g tablet TAKE 1 TABLET BY MOUTH EVERY DAY 05/08/21   Mauri Pole, MD  fluticasone (FLONASE) 50 MCG/ACT nasal spray Place 1 spray into both nostrils daily as needed for allergies or rhinitis.    [provider]  gabapentin (NEURONTIN) 100 MG capsule Take 1 capsule (100 mg total) by mouth 3 (three) times daily. 11/14/21   Debbrah Alar, NP  hydrALAZINE (APRESOLINE) 25 MG tablet TAKE 1 TABLET BY MOUTH THREE TIMES A DAY 09/05/21   Debbrah Alar, NP  leflunomide (ARAVA) 20  MG tablet Take 1 tablet (20 mg total) by mouth daily. 02/20/20   Debbrah Alar, NP  mycophenolate (CELLCEPT) 500 MG tablet Take 500 mg by mouth daily. 12/12/18   [provider]  polyethylene glycol powder (GLYCOLAX/MIRALAX) 17 GM/SCOOP powder Take 17 g by mouth daily. 09/24/21   Zehr, Laban Emperor, PA-C  prednisoLONE acetate (PRED FORTE) 1 % ophthalmic suspension Place 1 drop into  both eyes daily.     [provider]  predniSONE (DELTASONE) 5 MG tablet Take 5 mg by mouth daily as needed.    [provider]  PROLIA 60 MG/ML SOSY injection Inject 60 mg as directed every 6 (six) months. 05/11/18   [provider]   DG Chest 1 View  Result Date: 12/09/2021 CLINICAL DATA:  Fall. EXAM: CHEST  1 VIEW COMPARISON:  August 22, 2015. FINDINGS: The heart size and mediastinal contours are within normal limits. Both lungs are clear. The visualized skeletal structures are unremarkable. IMPRESSION: No active disease. Electronically Signed   By: Marijo Conception M.D.   On: 12/09/2021 11:56   DG HIP UNILAT WITH PELVIS 2-3 VIEWS LEFT  Result Date: 12/09/2021 CLINICAL DATA:  Fall, left hip pain EXAM: DG HIP (WITH OR WITHOUT PELVIS) 2-3V LEFT; LEFT FEMUR 2 VIEWS COMPARISON:  CT abdomen/pelvis 03/27/2020 FINDINGS: Hip: There is a minimally displaced subcapital left femoral neck fracture. The femoral head remains seated within the acetabulum. No fracture is seen on the right. The SI joints and symphysis pubis are intact. There is mild degenerative change about both hips. Femur: No other fracture is seen. Knee alignment is maintained. There is degenerative change of the knee joint, primarily affecting the medial compartment. The soft tissues are unremarkable. IMPRESSION: Minimally displaced subcapital left femoral neck fracture. Electronically Signed   By: Valetta Mole M.D.   On: 12/09/2021 11:56   DG FEMUR MIN 2 VIEWS LEFT  Result Date: 12/09/2021 CLINICAL DATA:  Fall, left hip pain  EXAM: DG HIP (WITH OR WITHOUT PELVIS) 2-3V LEFT; LEFT FEMUR 2 VIEWS COMPARISON:  CT abdomen/pelvis 03/27/2020 FINDINGS: Hip: There is a minimally displaced subcapital left femoral neck fracture. The femoral head remains seated within the acetabulum. No fracture is seen on the right. The SI joints and symphysis pubis are intact. There is mild degenerative change about both hips. Femur: No other fracture is seen. Knee alignment is maintained. There is degenerative change of the knee joint, primarily affecting the medial compartment. The soft tissues are unremarkable. IMPRESSION: Minimally displaced subcapital left femoral neck fracture. Electronically Signed   By: Valetta Mole M.D.   On: 12/09/2021 11:56    Positive ROS: All other systems have been reviewed and were otherwise negative with the exception of those mentioned in the HPI and as above.  Physical Exam: General: Alert, no acute distress Cardiovascular: No pedal edema Respiratory: No cyanosis, no use of accessory musculature GI: No organomegaly, abdomen is soft and non-tender Skin: No lesions in the area of chief complaint Neurologic: Sensation intact distally Psychiatric: Patient is competent for consent with normal mood and affect Lymphatic: No axillary or cervical lymphadenopathy  MUSCULOSKELETAL: Examination of the left hip reveals no skin wounds or lesions.  She is shortened and externally rotated.  She has pain with attempted logrolling of the hip.  She is neurovascularly intact distally.  Assessment: Displaced left femoral neck fracture Osteoporosis on Prolia injections Rheumatoid arthritis on weekly Humira injections, PO prednisone 5 mg usually 3x per week, cellcept, and leuflonamide  Plan: I discussed the findings with the patient.  She has a displaced left femoral neck fracture.  I recommend left total hip arthroplasty for pain control and to allow for immediate mobilization out of bed.  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  Nonetheless, I recommend proceeding with left hip surgery.  We did discuss the risk,  benefits, and alternatives.  Please see statement of risk.  Her Prolia will need to be held for 6 months postoperatively.  Hold Humira for 2 to 4 weeks postoperatively depending on healing of her incision.  Plan for surgery today.  All questions were solicited and answered.  The risks, benefits, and alternatives were discussed with the patient. There are risks associated with the surgery including, but not limited to, problems with anesthesia (death), infection, instability (giving out of the joint), dislocation, differences in leg length/angulation/rotation, fracture of bones, loosening or failure of implants, hematoma (blood accumulation) which may require surgical drainage, blood clots, pulmonary embolism, nerve injury (foot drop and lateral thigh numbness), and blood vessel injury. The patient understands these risks and elects to proceed. Continue NPO.   Bertram Savin, MD [phone]    12/10/2021 2:01 PM

## 2021-12-10 NOTE — TOC Initial Note (Signed)
Transition of Care [facility]) - Initial/Assessment Note    Patient Details  Name: Nancy [name] MRN: [id number] Date of Birth: Dec 18, 1940  Transition of [facility]) CM/SW Contact:    Dessa Phi, RN Phone Number: 12/10/2021, 9:56 AM  Clinical Narrative: Noted for ortho sx today L [facility]A-await recc.                  Expected Discharge Plan: Skilled Nursing Facility Barriers to Discharge: Continued Medical Work up   Patient Goals and CMS Choice Patient states their goals for this hospitalization and ongoing recovery are:: Home CMS Medicare.gov Compare Post Acute Care list provided to:: Patient Represenative (must comment) (Cathy dtr [phone]) Choice offered to / list presented to : Adult Children  Expected Discharge Plan and Services Expected Discharge Plan: [place]   Discharge Planning Services: CM Consult Post Acute Care Choice: [place] Living arrangements for the past 2 months: Single Family Home                                      Prior Living Arrangements/Services Living arrangements for the past 2 months: Single Family Home Lives with:: Self Patient language and need for interpreter reviewed:: Yes Do you feel safe going back to the place where you live?: Yes      Need for Family Participation in Patient Care: No (Comment) Care giver support system in place?: Yes (comment)   Criminal Activity/Legal Involvement Pertinent to Current Situation/Hospitalization: No - Comment as needed  Activities of Daily Living Home Assistive Devices/Equipment: Dentures (specify type), Blood pressure cuff, Cane (specify quad or straight), Walker (specify type) ADL Screening (condition at time of admission) Patient's cognitive ability adequate to safely complete daily activities?: Yes Is the patient deaf or have difficulty hearing?: No Does the patient have difficulty seeing, even when wearing glasses/contacts?: No Does the patient have difficulty  concentrating, remembering, or making decisions?: No Patient able to express need for assistance with ADLs?: Yes Does the patient have difficulty dressing or bathing?: No Independently performs ADLs?: Yes (appropriate for developmental age) Does the patient have difficulty walking or climbing stairs?: Yes Weakness of Legs: None Weakness of Arms/Hands: None  Permission Sought/Granted Permission sought to share information with : Case Manager Permission granted to share information with : Yes, Verbal Permission Granted  Share Information with NAME: Case manager           Emotional Assessment Appearance:: Appears stated age            Admission diagnosis:  Fall [W19.XXXA] Closed fracture of neck of left femur, initial encounter ([place]) [S72.002A] Subcapital fracture of neck of left femur, closed, initial encounter ([place]) [S72.012A] Patient Active Problem List   Diagnosis Date Noted   Subcapital fracture of neck of left femur, closed, initial encounter ([place]) 12/09/2021   Preop examination 12/09/2021   Neuropathy 11/14/2021   B12 deficiency 10/10/2021   Groin pain, chronic, left 10/10/2021   Abnormal CT of the chest 09/12/2019   Dyspnea 09/12/2019   Tendinitis of right rotator cuff 02/24/2018   History of CVA (cerebrovascular accident) 03/16/2017   Hematuria 03/16/2017   Subacromial bursitis of left shoulder joint 08/07/2016   Generalized anxiety disorder 01/07/2016   Anemia, iron deficiency 09/30/2015   Hyperglycemia 06/07/2015   Expressive aphasia 12/16/2014   TIA (transient ischemic attack) 12/16/2014   Rheumatoid arthritis ([place]) 07/25/2014   Hypothyroidism 01/12/2012  Back pain 01/11/2012   Edema 06/15/2011   ANEMIA, B12 DEFICIENCY 11/19/2010   Anxiety state 10/14/2010   MEMORY LOSS 10/14/2010   Hyperlipidemia 12/17/2009   Iron deficiency anemia 12/17/2009   TOBACCO ABUSE 12/17/2009   Depression 12/17/2009   Essential hypertension 12/17/2009   ALLERGIC RHINITIS  12/17/2009   ARTHRITIS, RHEUMATOID 12/17/2009   Osteoporosis 12/17/2009   URINARY INCONTINENCE 12/17/2009   PCP:  Debbrah Alar, NP Pharmacy:   [id number] - [place], [place] [address] Phone: [phone] Fax: [phone]     Social Determinants of Health (SDOH) Interventions    Readmission Risk Interventions No flowsheet data found.

## 2021-12-10 NOTE — Progress Notes (Signed)
Progress Note   Patient: Nancy [name] BSW:[id number] DOB: Mar 25, 1941 DOA: 12/09/2021     1  DOS: the patient was seen and examined on 12/10/2021   Brief hospital course: This 81 years old female with PMH significant for rheumatoid arthritis, TIA, uncontrolled hypertension, hyperlipidemia, depression presented to the ED status post mechanical fall.  Patient was in her kitchen and turned around and had lost her balance and had a fall.  She denies any head or neck injury.  She also denies any prior prodromal symptoms.  There is no focal neurological deficits noted at the time of evaluation.  X-ray showed evidence of left subcapital femoral fracture.  Orthopedic consulted, Patient is scheduled to have ORIF on 12/10/2021.  Assessment and Plan: * Subcapital fracture of neck of left femur, closed, initial encounter ([place])- (present on admission) Patient with history of osteoporosis presented status post mechanical fall. She lost her balance and fell on the ground.  Denies any head or neck injury. X-ray showed evidence of left subcapital femoral fracture. Orthopedic consulted.  Patient is scheduled for ORIF on 12/10/21 Patient takes Prolia for osteoporosis. Follow-up postoperative recommendation.    Preop examination Preoperative medical evaluation No Coronary revascularization / CVA  In last 5 years. No Recent stress test. She can Climb flight of stair, participates in recreational activity, does household chores. No Prior adverse event with anesthesia. No Alcohol use, drug use.  Cardiac risk: Based on RCRI She is a low risk for adverse Cardiac outcome from surgery. No further work-up necessary prior to surgery.  Neuropathy Continue gabapentin  History of CVA (cerebrovascular accident) Patient reported prior history of TIA/CVA.  MRI does not showed prior stroke. Patient stopped taking Plavix on her own 6 months ago. Would recommend resuming at least a baby aspirin postop.  Rheumatoid  arthritis ([place])- (present on admission) Patient takes leflunomide,  adalimumab and CellCept on  scheduled basis and prednisone on as-needed basis. Has not use prednisone more than 2 times a month. Does not require stress dose steroids.. We will keep rheumatoid arthritis medication on hold and will resume postoperatively  Essential hypertension- (present on admission)  Continue carvedilol, hydralazine and clonidine.  Hold losartan.  Depression- (present on admission) Mood remains stable. Continue home medications.  Iron deficiency anemia- (present on admission) H&H relatively stable. There is no obvious visible bleeding. Continue to monitor H&H. At risk for postop blood loss anemia.  Hyperlipidemia- (present on admission) Continue Colestipol   Subjective:  Patient was seen and examined at bedside.  Overnight events noted. Patient reports having pain in the left hip, when she moves her leg. She is scheduled to have ORIF today.  Physical Exam: Vitals:   12/09/21 1603 12/09/21 2045 12/09/21 2330 12/10/21 0423  BP: (!) 177/78 (!) 152/64 (!) 111/50 (!) 143/54  Pulse: 74 73 64 62  Resp: 18 18 18 18   Temp: 99.2 F (37.3 C) 99.6 F (37.6 C) 98.2 F (36.8 C) 98.5 F (36.9 C)  TempSrc: Oral Oral Oral Oral  SpO2: 99% 94% 94% (!) 89%  Weight:      Height:      Physical Exam Constitutional:      General: She is not in acute distress.    Appearance: Normal appearance.  HENT:     Head: Atraumatic.  Cardiovascular:     Rate and Rhythm: Normal rate and regular rhythm.     Pulses: Normal pulses.  Pulmonary:     Effort: Pulmonary effort is normal.     Breath  sounds: Normal breath sounds. No wheezing or rales.  Abdominal:     General: Abdomen is flat. Bowel sounds are normal. There is no distension.     Palpations: Abdomen is soft.     Tenderness: There is no rebound.  Musculoskeletal:        General: Tenderness present.     Cervical back: Neck supple.     Left lower leg: No  edema.     Comments: Left thigh tenderness noted, restricted movement.  Skin:    General: Skin is warm.     Findings: No rash.  Neurological:     General: No focal deficit present.     Mental Status: She is alert and oriented to person, place, and time.  Psychiatric:        Mood and Affect: Mood normal.        Behavior: Behavior normal.     Data Reviewed: I have reviewed vitals, lab results, independently visualized and interpreted imaging x-ray.  I reviewed the last note from Dr. Lyla Glassing.  Family Communication: No family at bedside  Disposition: Status is: Inpatient Remains inpatient appropriate because:  Admitted s/p fall with left femoral fracture.  Ortho was consulted scheduled to have ORIF today  Planned Discharge Destination: Skilled nursing facility  CODE STATUS: Full code Consults: Orthopedics DVT prophylaxis: SCDs     Time spent: 50 minutes.  Author: Shawna Clamp, MD 12/10/2021 11:56 AM  For on call review [email or web address].

## 2021-12-10 NOTE — Progress Notes (Signed)
Patient arrived to floor awake alert. Moderate nausea treated. Diet ordered. Family updated and at bedside.No pain. Dressing clean dry and intact

## 2021-12-10 NOTE — Progress Notes (Signed)
Dentures removed patient down to OR. Will update [place] daughter

## 2021-12-11 ENCOUNTER — Other Ambulatory Visit: Payer: Self-pay | Admitting: Family

## 2021-12-11 ENCOUNTER — Encounter (HOSPITAL_COMMUNITY): Payer: Self-pay | Admitting: Orthopedic Surgery

## 2021-12-11 LAB — CBC
HCT: 33 % — ABNORMAL LOW (ref 36.0–46.0)
Hemoglobin: 10.3 g/dL — ABNORMAL LOW (ref 12.0–15.0)
MCH: 28.3 pg (ref 26.0–34.0)
MCHC: 31.2 g/dL (ref 30.0–36.0)
MCV: 90.7 fL (ref 80.0–100.0)
Platelets: 163 10*3/uL (ref 150–400)
RBC: 3.64 MIL/uL — ABNORMAL LOW (ref 3.87–5.11)
RDW: 15.7 % — ABNORMAL HIGH (ref 11.5–15.5)
WBC: 14.5 10*3/uL — ABNORMAL HIGH (ref 4.0–10.5)
nRBC: 0 % (ref 0.0–0.2)

## 2021-12-11 LAB — BASIC METABOLIC PANEL
Anion gap: 7 (ref 5–15)
BUN: 24 mg/dL — ABNORMAL HIGH (ref 8–23)
CO2: 22 mmol/L (ref 22–32)
Calcium: 8.2 mg/dL — ABNORMAL LOW (ref 8.9–10.3)
Chloride: 103 mmol/L (ref 98–111)
Creatinine, Ser: 0.99 mg/dL (ref 0.44–1.00)
GFR, Estimated: 58 mL/min — ABNORMAL LOW (ref >60)
Glucose, Bld: 143 mg/dL — ABNORMAL HIGH (ref 70–99)
Potassium: 4.3 mmol/L (ref 3.5–5.1)
Sodium: 132 mmol/L — ABNORMAL LOW (ref 135–145)

## 2021-12-11 NOTE — Hospital Course (Addendum)
This 81 years old female with PMH significant for rheumatoid arthritis, TIA, uncontrolled hypertension, hyperlipidemia, depression presented to the ED status post mechanical fall. Patient was in her kitchen and turned around and had lost her balance and had a fall.  She denies any head or neck injury.  She also denies any prior prodromal symptoms.  There is no focal neurological deficits noted at the time of evaluation.  X-ray showed evidence of left subcapital femoral fracture. Orthopedic consulted, Patient underwent ORIF on 12/10/2021.  Postoperative day 5.  She is overall doing much better.  PT recommended skilled nursing facility, patient's blood pressure medications were adjusted.  Patient is doing much better and wants to be discharged.  Patient being discharged to SNF.

## 2021-12-11 NOTE — Progress Notes (Signed)
Progress Note   Patient: Nancy [name]:[id number] DOB: 1940/11/16 DOA: 12/09/2021     2 DOS: the patient was seen and examined on 12/11/2021   Brief hospital course: This 81 years old female with PMH significant for rheumatoid arthritis, TIA, uncontrolled hypertension, hyperlipidemia, depression presented to the ED status post mechanical fall.  Patient was in her kitchen and turned around and had lost her balance and had a fall.  She denies any head or neck injury.  She also denies any prior prodromal symptoms.  There is no focal neurological deficits noted at the time of evaluation.  X-ray showed evidence of left subcapital femoral fracture.  Orthopedic consulted, Patient is scheduled to have ORIF on 12/10/2021.  Assessment and Plan: * Subcapital fracture of neck of left femur, closed, initial encounter ([place])- (present on admission) Patient with history of osteoporosis presented status post mechanical fall. She lost her balance and fell on the ground.  Denies any head or neck injury. X-ray showed evidence of left subcapital femoral fracture. Orthopedic consulted.  Patient underwent ORIF (left total hip arthroplasty) on 2/1 Postoperative day 1.  Mobilize out of bed with PT. Weightbearing as tolerated.  Lovenox for DVT prophylaxis. Aspirin 81 mg twice daily for DVT prophylaxis for 6 weeks after discharge. Hold Prolia for at least 6 months postoperatively. Hold Humira for 2 to 4 weeks postoperatively depending upon healing of her incision. Follow-up outpatient in 2 weeks for routine postoperative care.    Neuropathy Continue gabapentin  History of CVA (cerebrovascular accident) Patient reported prior history of TIA/CVA.  MRI does not showed prior stroke. Patient stopped taking Plavix on her own 6 months ago. Would recommend resuming at least a baby aspirin postop.  Rheumatoid arthritis ([place])- (present on admission) Patient takes leflunomide,  adalimumab and CellCept on  scheduled basis  and prednisone on as-needed basis. Has not use prednisone more than 2 times a month. Does not require stress dose steroids.. We will keep rheumatoid arthritis medication on hold and will resume postoperatively in 2 to 4 weeks depend upon the healing of incision.  Essential hypertension- (present on admission)  Continue carvedilol, hydralazine and clonidine.  Hold losartan.  Depression- (present on admission) Mood remains stable. Continue home medications.  Iron deficiency anemia- (present on admission) H&H relatively stable. There is no obvious visible bleeding.   Hyperlipidemia- (present on admission) Continue Colestipol  Preop examination-resolved as of 12/11/2021 Preoperative medical evaluation No Coronary revascularization / CVA  In last 5 years. No Recent stress test. She can Climb flight of stair, participates in recreational activity, does household chores. No Prior adverse event with anesthesia. No Alcohol use, drug use.  Cardiac risk: Based on RCRI She is a low risk for adverse Cardiac outcome from surgery. No further work-up necessary prior to surgery.   Subjective:  Patient was seen and examined at bedside.  Overnight events noted.   Patient is s/p ORIF ( total hip hemiarthroplasty) postoperative day 1.   Patient reports having slight pain in the left.  Otherwise able to move her leg.  Physical Exam: Vitals:   12/10/21 1742 12/10/21 2127 12/11/21 0105 12/11/21 0454  BP: (!) 158/55 (!) 144/55 (!) 150/57 (!) 145/54  Pulse: 60 62 (!) 56 (!) 56  Resp: 16  18 16   Temp: 97.7 F (36.5 C) 98 F (36.7 C) 97.7 F (36.5 C) 97.7 F (36.5 C)  TempSrc: Oral Oral Oral Oral  SpO2: 92% 95% 98% 99%  Weight:      Height:  Physical Exam Constitutional:      Appearance: Normal appearance.  Eyes:     Extraocular Movements: Extraocular movements intact.     Pupils: Pupils are equal, round, and reactive to light.  Cardiovascular:     Rate and Rhythm: Normal rate and  regular rhythm.     Heart sounds: No murmur heard. Pulmonary:     Effort: Pulmonary effort is normal.     Breath sounds: Normal breath sounds.  Abdominal:     General: Abdomen is flat.     Palpations: Abdomen is soft.  Musculoskeletal:        General: Normal range of motion.     Cervical back: Normal range of motion and neck supple.     Comments: S/p ORIF, mild left hip tenderness.  Skin:    General: Skin is warm and dry.  Neurological:     General: No focal deficit present.     Mental Status: She is alert and oriented to person, place, and time.  Psychiatric:        Mood and Affect: Mood normal.        Behavior: Behavior normal.    Data Reviewed: I have reviewed vitals, lab results, independently visualized and interpreted imaging x-ray.  I reviewed the last note from Dr. Lyla Glassing.  Family Communication: No family around bedside  Disposition: Status is: Inpatient Remains inpatient appropriate because: Admitted for left hip fracture underwent ORIF. Awaiting PT and OT eval for possible rehab placement.   Planned Discharge Destination: Skilled nursing facility  CODE STATUS: Full code Consults orthopedics DVT prophylaxis: Lovenox.  Time spent: 35 minutes  Author: Shawna Clamp, MD 12/11/2021 10:42 AM  For on call review [email or web address].

## 2021-12-11 NOTE — Evaluation (Signed)
Physical Therapy Evaluation Patient Details Name: Nancy [name] MRN: [id number] DOB: 1941-04-28 Today's Date: 12/11/2021  History of Present Illness  81 years old female with PMH significant for rheumatoid arthritis, TIA, uncontrolled hypertension, hyperlipidemia, depression presented to the ED status post mechanical fall and sustained left subcapital femoral fracture.  Pt s/p left THA direct anterior approach on 12/10/21  Clinical Impression  Pt admitted with above diagnosis. Pt currently with functional limitations due to the deficits listed below (see PT Problem List). Pt will benefit from skilled PT to increase their independence and safety with mobility to allow discharge to the venue listed below.  Pt reports typically being very independent and lives alone.  Pt assisted with ambulating in hallway and requiring min-mod assist for mobility at this time.  Pt uncertain if her children would be able to assist her upon d/c so she is open to d/c to SNF.  She would like to return to previous level of independence as soon as possible.      Recommendations for follow up therapy are one component of a multi-disciplinary discharge planning process, led by the attending physician.  Recommendations may be updated based on patient status, additional functional criteria and insurance authorization.  Follow Up Recommendations Skilled nursing-short term rehab (<3 hours/day)    Assistance Recommended at Discharge    Patient can return home with the following  A little help with walking and/or transfers;A little help with bathing/dressing/bathroom;Assist for transportation;Assistance with cooking/housework    Equipment Recommendations None recommended by PT  Recommendations for Other Services       Functional Status Assessment Patient has had a recent decline in their functional status and demonstrates the ability to make significant improvements in function in a reasonable and predictable amount of time.      Precautions / Restrictions Precautions Precautions: None;Fall Restrictions Weight Bearing Restrictions: No Other Position/Activity Restrictions: WBAT      Mobility  Bed Mobility Overal bed mobility: Needs Assistance Bed Mobility: Supine to Sit     Supine to sit: HOB elevated, Mod assist     General bed mobility comments: assist for Lt LE over EOB and trunk upright    Transfers Overall transfer level: Needs assistance Equipment used: Rolling walker (2 wheels) Transfers: Sit to/from Stand Sit to Stand: Min assist           General transfer comment: verbal cues for UE and LE positioning to assist with pain    Ambulation/Gait Ambulation/Gait assistance: Min assist Gait Distance (Feet): 120 Feet Assistive device: Rolling walker (2 wheels) Gait Pattern/deviations: Step-to pattern, Decreased stance time - left, Antalgic       General Gait Details: pt very unsteady and ataxic gait observed, pt reports being "a little off balance" at baseline, cues for RW positioning, posture, step length, RW positioning  Stairs            Wheelchair Mobility    Modified Rankin (Stroke Patients Only)       Balance Overall balance assessment: History of Falls                                           Pertinent Vitals/Pain Pain Assessment Pain Assessment: 0-10 Pain Score: 5  Pain Location: left hip Pain Descriptors / Indicators: Sore, Tender Pain Intervention(s): Monitored during session, Premedicated before session, Repositioned    Home Living Family/patient expects to be discharged to::  Private residence Living Arrangements: Alone   Type of Home: House Home Access: Level entry       [place]: One [place]: Conservation officer, nature (2 wheels);Cane - single point      Prior Function Prior Level of Function : Independent/Modified Independent                     Hand Dominance        Extremity/Trunk Assessment   Upper  Extremity Assessment Upper Extremity Assessment:  (bil hand deformities from RA)    Lower Extremity Assessment Lower Extremity Assessment: LLE deficits/detail LLE Deficits / Details: anticipated post op hip weakness, hip grossly 2+/5 at best; pt able to perform ankle pumps       Communication   Communication: HOH  Cognition Arousal/Alertness: Awake/alert Behavior During Therapy: WFL for tasks assessed/performed Overall Cognitive Status: Within Functional Limits for tasks assessed                                          General Comments      Exercises     Assessment/Plan    PT Assessment Patient needs continued PT services  PT Problem List Decreased strength;Decreased range of motion;Decreased balance;Decreased mobility;Decreased knowledge of precautions;Decreased knowledge of use of DME;Pain       PT Treatment Interventions DME instruction;Gait training;Balance training;Therapeutic exercise;Functional mobility training;Patient/family education;Therapeutic activities;Stair training    PT Goals (Current goals can be found in the Care Plan section)  Acute Rehab PT Goals PT Goal Formulation: With patient Time For Goal Achievement: 12/25/21 Potential to Achieve Goals: Good    Frequency Min 5X/week     Co-evaluation               AM-PAC PT "6 Clicks" Mobility  Outcome Measure Help needed turning from your back to your side while in a flat bed without using bedrails?: A Lot Help needed moving from lying on your back to sitting on the side of a flat bed without using bedrails?: A Lot Help needed moving to and from a bed to a chair (including a wheelchair)?: A Lot Help needed standing up from a chair using your arms (e.g., wheelchair or bedside chair)?: A Lot Help needed to walk in hospital room?: A Lot Help needed climbing 3-5 steps with a railing? : A Lot 6 Click Score: 12    End of Session Equipment Utilized During Treatment: Gait  belt Activity Tolerance: Patient tolerated treatment well Patient left: in chair;with call bell/phone within reach;with chair alarm set Nurse Communication: Mobility status PT Visit Diagnosis: Difficulty in walking, not elsewhere classified (R26.2)    Time: 2395-3202 PT Time Calculation (min) (ACUTE ONLY): 26 min   Charges:   PT Evaluation $PT Eval Low Complexity: 1 Low PT Treatments $Gait Training: 8-22 mins       Jannette Spanner PT, DPT Acute Rehabilitation Services Pager: [phone] Office: [place] 12/11/2021, 2:35 PM

## 2021-12-12 LAB — BASIC METABOLIC PANEL
Anion gap: 6 (ref 5–15)
BUN: 20 mg/dL (ref 8–23)
CO2: 23 mmol/L (ref 22–32)
Calcium: 7.8 mg/dL — ABNORMAL LOW (ref 8.9–10.3)
Chloride: 107 mmol/L (ref 98–111)
Creatinine, Ser: 0.7 mg/dL (ref 0.44–1.00)
GFR, Estimated: >60 mL/min (ref >60)
Glucose, Bld: 129 mg/dL — ABNORMAL HIGH (ref 70–99)
Potassium: 4.8 mmol/L (ref 3.5–5.1)
Sodium: 136 mmol/L (ref 135–145)

## 2021-12-12 LAB — CBC
HCT: 26.6 % — ABNORMAL LOW (ref 36.0–46.0)
Hemoglobin: 8.3 g/dL — ABNORMAL LOW (ref 12.0–15.0)
MCH: 28.2 pg (ref 26.0–34.0)
MCHC: 31.2 g/dL (ref 30.0–36.0)
MCV: 90.5 fL (ref 80.0–100.0)
Platelets: 148 10*3/uL — ABNORMAL LOW (ref 150–400)
RBC: 2.94 MIL/uL — ABNORMAL LOW (ref 3.87–5.11)
RDW: 15.9 % — ABNORMAL HIGH (ref 11.5–15.5)
WBC: 11.4 10*3/uL — ABNORMAL HIGH (ref 4.0–10.5)
nRBC: 0 % (ref 0.0–0.2)

## 2021-12-12 LAB — HEMOGLOBIN AND HEMATOCRIT, BLOOD
HCT: 27.1 % — ABNORMAL LOW (ref 36.0–46.0)
Hemoglobin: 8.8 g/dL — ABNORMAL LOW (ref 12.0–15.0)

## 2021-12-12 MED ORDER — HYDROCODONE-ACETAMINOPHEN 5-325 MG PO TABS: 1.0000 | ORAL_TABLET | ORAL | 0 refills | Status: AC | PRN

## 2021-12-12 MED ORDER — ASPIRIN 81 MG PO CHEW: 81.0000 mg | CHEWABLE_TABLET | Freq: Two times a day (BID) | ORAL | 0 refills | Status: AC

## 2021-12-12 NOTE — Progress Notes (Signed)
° ° °  Subjective:  Patient reports pain as mild to moderate.  Denies N/V/CP/SOB. No c/o.  Objective:   VITALS:   Vitals:   12/11/21 2140 12/12/21 0347 12/12/21 0800 12/12/21 1331  BP: 116/60 (!) 159/57  (!) 155/56  Pulse: 65 65  72  Resp: _0 Temp: 97.7 F (36.5 C) 98.1 F (36.7 C)  98.1 F (36.7 C)  TempSrc: Oral Oral  Oral  SpO2: 98% (!) 65%  100%  Weight:      Height:        NAD Sensation intact distally Intact pulses distally Dorsiflexion/Plantar flexion intact Incision: dressing C/D/I Compartment soft   Lab Results  Component Value Date   WBC 11.4 (H) 12/12/2021   HGB 8.8 (L) 12/12/2021   HCT 27.1 (L) 12/12/2021   MCV 90.5 12/12/2021   PLT 148 (L) 12/12/2021   BMET    Component Value Date/Time   NA 136 12/12/2021 0432   NA 142 09/24/2015 1113   K 4.8 12/12/2021 0432   K 4.1 09/24/2015 1113   CL 107 12/12/2021 0432   CO2 23 12/12/2021 0432   CO2 21 (L) 09/24/2015 1113   GLUCOSE 129 (H) 12/12/2021 0432   GLUCOSE 104 09/24/2015 1113   BUN 20 12/12/2021 0432   BUN 17.8 09/24/2015 1113   CREATININE 0.70 12/12/2021 0432   CREATININE 0.85 09/09/2020 1033   CREATININE 0.8 09/24/2015 1113   CALCIUM 7.8 (L) 12/12/2021 0432   CALCIUM 10.0 09/24/2015 1113   EGFR 89 (L) 09/24/2015 1113   GFRNONAA >60 12/12/2021 0432     Assessment/Plan: 2 Days Post-Op   Principal Problem:   Subcapital fracture of neck of left femur, closed, initial encounter ([place]) Active Problems:   Hyperlipidemia   Iron deficiency anemia   Depression   Essential hypertension   Osteoporosis   Rheumatoid arthritis (HCC)   History of CVA (cerebrovascular accident)   Neuropathy   WBAT with walker DVT ppx:  Lovenox in house --> d/c on ASA 81 mg PO BID for 6 weeks , SCDs, TEDS PO pain control PT/OT Dispo: SNF placement, f/u in 2 weeks, Rx for pain meds and DVT ppx on chart Hold Prolia for 6 months postop Hold Humira until follow up   Bertram Savin 12/12/2021, 2:24  PM   Rod Can, MD [phone] [place] [facility]   [place] [address], [place], [place] [address] Phone: [phone] [email or web address] Facebook   [employer]

## 2021-12-12 NOTE — TOC Progression Note (Signed)
Transition of Care [facility]) - Progression Note    Patient Details  Name: JANARA KLETT MRN: [id number] Date of Birth: [birth year]  Transition of Care [facility]) CM/SW Contact  Kabeer Hoagland, Juliann Pulse, RN Phone Number: 12/12/2021, 2:04 PM  Clinical Narrative: Per permission faxed out await bed offers prior initiating auth.      Expected Discharge Plan: [place] Barriers to Discharge: Continued Medical Work up  Expected Discharge Plan and Services Expected Discharge Plan: [place]   Discharge Planning Services: CM Consult Post Acute Care Choice: [place] Living arrangements for the past 2 months: Single Family Home                                       Social Determinants of Health (SDOH) Interventions    Readmission Risk Interventions No flowsheet data found.

## 2021-12-12 NOTE — Progress Notes (Signed)
Progress Note   Patient: Nancy [name] DOB: December 13, 1940 DOA: 12/09/2021     3 DOS: the patient was seen and examined on 12/12/2021   Brief hospital course: This 81 years old female with PMH significant for rheumatoid arthritis, TIA, uncontrolled hypertension, hyperlipidemia, depression presented to the ED status post mechanical fall.  Patient was in her kitchen and turned around and had lost her balance and had a fall.  She denies any head or neck injury.  She also denies any prior prodromal symptoms.  There is no focal neurological deficits noted at the time of evaluation.  X-ray showed evidence of left subcapital femoral fracture. Orthopedic consulted, Patient underwent ORIF on 12/10/2021.  Postoperative day 2.  She is overall doing much better.  PT recommended skilled nursing facility awaiting insurance authorization.  Assessment and Plan: * Subcapital fracture of neck of left femur, closed, initial encounter ([place])- (present on admission) Patient with history of osteoporosis presented status post mechanical fall. X-ray showed evidence of left subcapital femoral fracture. Orthopedic consulted.  Patient underwent ORIF (left total hip arthroplasty) on 2/1 Postoperative day 2.  Mobilize out of bed with PT. Weightbearing as tolerated.  Lovenox for DVT prophylaxis. Aspirin 81 mg twice daily for DVT prophylaxis for 6 weeks after discharge. Hold Prolia for at least 6 months postoperatively. Hold Humira for 2 to 4 weeks postoperatively depending upon healing of her incision. Follow-up outpatient in 2 weeks for routine postoperative care.    Neuropathy Continue gabapentin  History of CVA (cerebrovascular accident) Patient reported prior history of TIA/CVA.  MRI does not showed prior stroke. Patient stopped taking Plavix on her own 6 months ago. Would recommend resuming at least a baby aspirin postop.  Rheumatoid arthritis ([place])- (present on admission) Patient takes leflunomide,   adalimumab and CellCept on  scheduled basis and prednisone on as-needed basis. Has not used prednisone more than 2 times a month. Does not require stress dose steroids.. We will keep rheumatoid arthritis medication on hold and will resume postoperatively in 2 to 4 weeks depend upon the healing of incision.  Essential hypertension- (present on admission)  Continue carvedilol, hydralazine and clonidine.  Hold losartan.  Depression- (present on admission) Continue home medications. Mood remains stable.  Iron deficiency anemia- (present on admission) There is no obvious visible bleeding. H&H remains reasonably stable.  Hyperlipidemia- (present on admission) Continue Colestipol  Preop examination-resolved as of 12/11/2021 Preoperative medical evaluation No Coronary revascularization / CVA  In last 5 years. No Recent stress test. She can Climb flight of stair, participates in recreational activity, does household chores. No Prior adverse event with anesthesia. No Alcohol use, drug use.  Cardiac risk: Based on RCRI She is a low risk for adverse Cardiac outcome from surgery. No further work-up necessary prior to surgery.   Subjective:  Patient was seen and examined at bedside.  Overnight events noted.   Patient is s/p ORIF (total hip hemiarthroplasty) postoperative day 2.   Patient reports feeling much better, She is able to move her leg,  still has mild pain.  Physical Exam: Vitals:   12/11/21 1812 12/11/21 2140 12/12/21 0347 12/12/21 0800  BP: (!) 131/54 116/60 (!) 159/57   Pulse: 68 65 65   Resp: 20 16  17   Temp: 97.8 F (36.6 C) 97.7 F (36.5 C) 98.1 F (36.7 C)   TempSrc: Oral Oral Oral   SpO2: 98% 98% (!) 65%   Weight:      Height:  Physical Exam Constitutional:      Appearance: Normal appearance.  HENT:     Head: Normocephalic and atraumatic.  Cardiovascular:     Rate and Rhythm: Normal rate and regular rhythm.     Pulses: Normal pulses.     Heart sounds:  Normal heart sounds.  Pulmonary:     Effort: Pulmonary effort is normal.     Breath sounds: Normal breath sounds.  Abdominal:     General: Abdomen is flat. Bowel sounds are normal.     Palpations: Abdomen is soft.  Musculoskeletal:     Cervical back: Normal range of motion and neck supple.     Comments: Left hip tenderness+ able to move left lower extremity.  Skin:    General: Skin is warm and dry.  Neurological:     General: No focal deficit present.     Mental Status: She is alert and oriented to person, place, and time. Mental status is at baseline.  Psychiatric:        Mood and Affect: Mood normal.        Behavior: Behavior normal.     Data Reviewed: I have reviewed vitals, lab results, independently visualized and interpreted imaging x-ray.  I have also reviewed the last note from Dr. Lyla Glassing and Ortho recommendation postoperatively.   Family Communication: Spoke with family on the phone.  Disposition: Status is: Inpatient Remains inpatient appropriate because: Admitted for left hip fracture underwent ORIF.  PT and OT recommended SNF awaiting insurance authorization.  CODE STATUS: Full Consultation: Orthopedics  DVT prophylaxis: Low   Planned Discharge Destination: Skilled nursing facility  Time spent: 35 minutes  Author: Shawna Clamp, MD 12/12/2021 12:02 PM  For on call review [email or web address].

## 2021-12-12 NOTE — Progress Notes (Signed)
° ° °  Subjective:  Patient reports pain as mild to moderate.  Denies N/V/CP/SOB. No c/o.  Objective:   VITALS:   Vitals:   12/11/21 2140 12/12/21 0347 12/12/21 0800 12/12/21 1331  BP: 116/60 (!) 159/57  (!) 155/56  Pulse: 65 65  72  Resp: $Remo'16  17 20  'dQmxi$ Temp: 97.7 F (36.5 C) 98.1 F (36.7 C)  98.1 F (36.7 C)  TempSrc: Oral Oral  Oral  SpO2: 98% (!) 65%  100%  Weight:      Height:        NAD Sensation intact distally Intact pulses distally Dorsiflexion/Plantar flexion intact Incision: dressing C/D/I Compartment soft   Lab Results  Component Value Date   WBC 11.4 (H) 12/12/2021   HGB 8.8 (L) 12/12/2021   HCT 27.1 (L) 12/12/2021   MCV 90.5 12/12/2021   PLT 148 (L) 12/12/2021   BMET    Component Value Date/Time   NA 136 12/12/2021 0432   NA 142 09/24/2015 1113   K 4.8 12/12/2021 0432   K 4.1 09/24/2015 1113   CL 107 12/12/2021 0432   CO2 23 12/12/2021 0432   CO2 21 (L) 09/24/2015 1113   GLUCOSE 129 (H) 12/12/2021 0432   GLUCOSE 104 09/24/2015 1113   BUN 20 12/12/2021 0432   BUN 17.8 09/24/2015 1113   CREATININE 0.70 12/12/2021 0432   CREATININE 0.85 09/09/2020 1033   CREATININE 0.8 09/24/2015 1113   CALCIUM 7.8 (L) 12/12/2021 0432   CALCIUM 10.0 09/24/2015 1113   EGFR 89 (L) 09/24/2015 1113   GFRNONAA >60 12/12/2021 0432     Assessment/Plan: 2 Days Post-Op   Principal Problem:   Subcapital fracture of neck of left femur, closed, initial encounter ([place]) Active Problems:   Hyperlipidemia   Iron deficiency anemia   Depression   Essential hypertension   Osteoporosis   Rheumatoid arthritis (HCC)   History of CVA (cerebrovascular accident)   Neuropathy   WBAT with walker DVT ppx:  Lovenox in house --> d/c on ASA 81 mg PO BID for 6 weeks , SCDs, TEDS PO pain control PT/OT Dispo: d/c planning Hold Prolia for 6 months postop Hold Humira until follow up   Bertram Savin 12/12/2021, 2:22 PM   Rod Can, MD [phone] [name] now [name]   [place] [address], [place], [place] [address] Phone: [phone] [email or web address]   [employer]

## 2021-12-12 NOTE — Progress Notes (Signed)
Physical Therapy Treatment Patient Details Name: Nancy [name] MRN: [id number] DOB: 05/19/1941 Today's Date: 12/12/2021   History of Present Illness 81 years old female with PMH significant for rheumatoid arthritis, TIA, uncontrolled hypertension, hyperlipidemia, depression presented to the ED status post mechanical fall and sustained left subcapital femoral fracture.  Pt s/p left THA direct anterior approach on 12/10/21    PT Comments    Pt assisted with ambulating in hallway and performed a couple exercises upon sitting in recliner however fatigued quickly.  Pt anticipates d/c to SNF.   Recommendations for follow up therapy are one component of a multi-disciplinary discharge planning process, led by the attending physician.  Recommendations may be updated based on patient status, additional functional criteria and insurance authorization.  Follow Up Recommendations  Skilled nursing-short term rehab (<3 hours/day)     Assistance Recommended at Discharge    Patient can return home with the following A little help with walking and/or transfers;A little help with bathing/dressing/bathroom;Assist for transportation;Assistance with cooking/housework   Equipment Recommendations  None recommended by PT    Recommendations for Other Services       Precautions / Restrictions Precautions Precautions: None;Fall Restrictions Other Position/Activity Restrictions: WBAT     Mobility  Bed Mobility Overal bed mobility: Needs Assistance Bed Mobility: Supine to Sit     Supine to sit: HOB elevated, Mod assist     General bed mobility comments: assist for Lt LE over EOB and trunk upright    Transfers Overall transfer level: Needs assistance Equipment used: Rolling walker (2 wheels) Transfers: Sit to/from Stand Sit to Stand: Min assist           General transfer comment: verbal cues for UE and LE positioning; assist to steady with rise and control descent     Ambulation/Gait Ambulation/Gait assistance: Min assist Gait Distance (Feet): 120 Feet Assistive device: Rolling walker (2 wheels) Gait Pattern/deviations: Step-to pattern, Decreased stance time - left, Antalgic       General Gait Details: pt very unsteady and ataxic gait observed, cues for RW positioning, posture, step length; assist for occasional steady   Stairs             Wheelchair Mobility    Modified Rankin (Stroke Patients Only)       Balance                                            Cognition Arousal/Alertness: Awake/alert Behavior During Therapy: WFL for tasks assessed/performed Overall Cognitive Status: Within Functional Limits for tasks assessed                                          Exercises Total Joint Exercises Ankle Circles/Pumps: AROM, Both, 10 reps Quad Sets: AROM, Left, 10 reps Long Arc Quad: AROM, Left, 10 reps, Seated    General Comments        Pertinent Vitals/Pain Pain Assessment Pain Assessment: 0-10 Pain Score: 7  Pain Location: neck Pain Descriptors / Indicators: Sore Pain Intervention(s): Repositioned, Monitored during session (pt states she has asked for pain meds, RN checking,)    Home Living                          Prior Function  PT Goals (current goals can now be found in the care plan section) Progress towards PT goals: Progressing toward goals    Frequency    Min 5X/week      PT Plan Current plan remains appropriate    Co-evaluation              AM-PAC PT "6 Clicks" Mobility   Outcome Measure  Help needed turning from your back to your side while in a flat bed without using bedrails?: A Lot Help needed moving from lying on your back to sitting on the side of a flat bed without using bedrails?: A Lot Help needed moving to and from a bed to a chair (including a wheelchair)?: A Lot Help needed standing up from a chair using your arms  (e.g., wheelchair or bedside chair)?: A Lot Help needed to walk in hospital room?: A Lot Help needed climbing 3-5 steps with a railing? : A Lot 6 Click Score: 12    End of Session Equipment Utilized During Treatment: Gait belt Activity Tolerance: Patient tolerated treatment well Patient left: in chair;with call bell/phone within reach;with chair alarm set   PT Visit Diagnosis: Difficulty in walking, not elsewhere classified (R26.2)     Time: 1319-1340 PT Time Calculation (min) (ACUTE ONLY): 21 min  Charges:  $Gait Training: 8-22 mins           Arlyce Dice, DPT Acute Rehabilitation Services Pager: [phone] Office: [place] 12/12/2021, 3:46 PM

## 2021-12-12 NOTE — NC FL2 (Signed)
[place] LEVEL OF CARE SCREENING TOOL     IDENTIFICATION  Patient Name: Nancy [name] Birthdate: 09/24/1941 Sex: female Admission Date (Current Location): 12/09/2021  [facility] and [place] Number:  Herbalist and Address:  [facility],  [place], [place]      Provider Number: [id number]  Attending Physician Name and Address:  Shawna Clamp, MD  Relative Name and Phone Number:  Lidia Collum dtr [place]: Hospital Recommended Level of Care: [place] Prior Approval Number:    Date Approved/Denied:   PASRR Number: [id number]  Discharge Plan: SNF    Current Diagnoses: Patient Active Problem List   Diagnosis Date Noted   Subcapital fracture of neck of left femur, closed, initial encounter ([place]) 12/09/2021   Neuropathy 11/14/2021   B12 deficiency 10/10/2021   Groin pain, chronic, left 10/10/2021   Abnormal CT of the chest 09/12/2019   Dyspnea 09/12/2019   Tendinitis of right rotator cuff 02/24/2018   History of CVA (cerebrovascular accident) 03/16/2017   Hematuria 03/16/2017   Subacromial bursitis of left shoulder joint 08/07/2016   Generalized anxiety disorder 01/07/2016   Anemia, iron deficiency 09/30/2015   Hyperglycemia 06/07/2015   Expressive aphasia 12/16/2014   TIA (transient ischemic attack) 12/16/2014   Rheumatoid arthritis ([place]) 07/25/2014   Hypothyroidism 01/12/2012   Back pain 01/11/2012   Edema 06/15/2011   ANEMIA, B12 DEFICIENCY 11/19/2010   Anxiety state 10/14/2010   MEMORY LOSS 10/14/2010   Hyperlipidemia 12/17/2009   Iron deficiency anemia 12/17/2009   TOBACCO ABUSE 12/17/2009   Depression 12/17/2009   Essential hypertension 12/17/2009   ALLERGIC RHINITIS 12/17/2009   ARTHRITIS, RHEUMATOID 12/17/2009   Osteoporosis 12/17/2009   URINARY INCONTINENCE 12/17/2009    Orientation RESPIRATION BLADDER Height & Weight     Self, Time, Situation,  Place  Normal Continent Weight: 52.2 kg Height:  5\' 3"  (160 cm)  BEHAVIORAL SYMPTOMS/MOOD NEUROLOGICAL BOWEL NUTRITION STATUS      Continent Diet (Regular)  AMBULATORY STATUS COMMUNICATION OF NEEDS Skin   Limited Assist Verbally Surgical wounds (L hip surgical site)                       Personal Care Assistance Level of Assistance  Bathing, Feeding, Dressing Bathing Assistance: Limited assistance Feeding assistance: Limited assistance Dressing Assistance: Limited assistance     Functional Limitations Info  Sight, Hearing, Speech Sight Info: Impaired (eyeglasses) Hearing Info: Adequate Speech Info: Impaired (Bilateral dentures)    SPECIAL CARE FACTORS FREQUENCY  PT (By licensed PT), OT (By licensed OT)     PT Frequency:  (5x week) OT Frequency:  (5x week)            Contractures Contractures Info: Not present    Additional Factors Info  Code Status, Allergies Code Status Info:  (Full) Allergies Info:  (Iron)           Current Medications (12/12/2021):  This is the current hospital active medication list Current Facility-Administered Medications  Medication Dose Route Frequency Provider Last Rate Last Admin   0.9 %  sodium chloride infusion   Intravenous Continuous Rod Can, MD 75 mL/hr at 12/11/21 2208 New Bag at 12/11/21 2208   albuterol (PROVENTIL) (2.5 MG/3ML) 0.083% nebulizer solution 2.5 mg  2.5 mg Nebulization Q6H PRN Swinteck, Aaron Edelman, MD       carvedilol (COREG) tablet 25 mg  25 mg Oral BID Rod Can, MD  25 mg at 12/12/21 0539   Chlorhexidine Gluconate Cloth 2 % PADS 6 each  6 each Topical Daily Shawna Clamp, MD   6 each at 12/12/21 0919   cloNIDine (CATAPRES) tablet 0.1 mg  0.1 mg Oral TID Rod Can, MD   0.1 mg at 12/12/21 7673   docusate sodium (COLACE) capsule 100 mg  100 mg Oral BID Lavina Hamman, MD   100 mg at 12/12/21 0918   enoxaparin (LOVENOX) injection 40 mg  40 mg Subcutaneous Q24H Rod Can, MD   40 mg at  12/12/21 4193   feeding supplement (ENSURE SURGERY) liquid 237 mL  237 mL Oral Q24H Rod Can, MD   237 mL at 12/12/21 7902   gabapentin (NEURONTIN) capsule 100 mg  100 mg Oral TID Rod Can, MD   100 mg at 12/12/21 4097   hydrALAZINE (APRESOLINE) tablet 25 mg  25 mg Oral TID Rod Can, MD   25 mg at 12/12/21 3532   HYDROcodone-acetaminophen (NORCO/VICODIN) 5-325 MG per tablet 1-2 tablet  1-2 tablet Oral Q6H PRN Rod Can, MD   1 tablet at 12/11/21 1140   lactated ringers infusion   Intravenous Continuous Janeece Riggers, MD 50 mL/hr at 12/10/21 1354 New Bag at 12/10/21 1631   menthol-cetylpyridinium (CEPACOL) lozenge 3 mg  1 lozenge Oral PRN Swinteck, Aaron Edelman, MD       Or   phenol (CHLORASEPTIC) mouth spray 1 spray  1 spray Mouth/Throat PRN Swinteck, Aaron Edelman, MD       methocarbamol (ROBAXIN) tablet 500 mg  500 mg Oral Q6H PRN Swinteck, Aaron Edelman, MD       Or   methocarbamol (ROBAXIN) 500 mg in dextrose 5 % 50 mL IVPB  500 mg Intravenous Q6H PRN Swinteck, Aaron Edelman, MD       metoCLOPramide (REGLAN) tablet 5-10 mg  5-10 mg Oral Q8H PRN Swinteck, Aaron Edelman, MD       Or   metoCLOPramide (REGLAN) injection 5-10 mg  5-10 mg Intravenous Q8H PRN Swinteck, Aaron Edelman, MD       morphine 2 MG/ML injection 2 mg  2 mg Intravenous Q2H PRN Swinteck, Aaron Edelman, MD   2 mg at 12/10/21 1014   mupirocin ointment (BACTROBAN) 2 % 1 application  1 application Nasal BID Rod Can, MD   1 application at 99/24/26 0918   ondansetron (ZOFRAN) tablet 4 mg  4 mg Oral Q6H PRN Swinteck, Aaron Edelman, MD       Or   ondansetron [facility]) injection 4 mg  4 mg Intravenous Q6H PRN Rod Can, MD   4 mg at 12/10/21 1807   polyethylene glycol (MIRALAX / GLYCOLAX) packet 17 g  17 g Oral Daily PRN Rod Can, MD         Discharge Medications: Please see discharge summary for a list of discharge medications.  Relevant Imaging Results:  Relevant Lab Results:   Additional Information SS#[id number], [place],  [place]

## 2021-12-13 LAB — CBC
HCT: 25.7 % — ABNORMAL LOW (ref 36.0–46.0)
Hemoglobin: 8.3 g/dL — ABNORMAL LOW (ref 12.0–15.0)
MCH: 28.7 pg (ref 26.0–34.0)
MCHC: 32.3 g/dL (ref 30.0–36.0)
MCV: 88.9 fL (ref 80.0–100.0)
Platelets: 158 10*3/uL (ref 150–400)
RBC: 2.89 MIL/uL — ABNORMAL LOW (ref 3.87–5.11)
RDW: 16 % — ABNORMAL HIGH (ref 11.5–15.5)
WBC: 11 10*3/uL — ABNORMAL HIGH (ref 4.0–10.5)
nRBC: 0 % (ref 0.0–0.2)

## 2021-12-13 MED ORDER — HYDRALAZINE HCL 20 MG/ML IJ SOLN
10.0000 mg | Freq: Four times a day (QID) | INTRAMUSCULAR | Status: DC | PRN
  Administered 2021-12-14: 10 mg via INTRAVENOUS
  Filled 2021-12-13: qty 1

## 2021-12-13 NOTE — Telephone Encounter (Signed)
Pt ready for scheduling on or after 11/30/21  Out-of-pocket cost due at time of visit: $276  Primary: [facility] Medicare Prol[place] co-insurance: 20% (approximately $276) Admin fee co-insurance: 0%   Secondary: n/a Prol[place] co-insurance:  Admin fee co-insurance:   Deductible: does not apply  Prior Auth: APPROVED PA# [id number] Valid: 11/29/21-11/29/22  ** This summary of benefits is an estimation of the patient's out-of-pocket cost. Exact cost may vary based on individual plan coverage.

## 2021-12-13 NOTE — Progress Notes (Signed)
°   12/13/21 1400  Mobility  Activity Ambulated with assistance in hallway  Level of Assistance Contact guard assist, steadying assist  Assistive Device Front wheel walker  Distance Ambulated (ft) 80 ft  Activity Response Tolerated well  $Mobility charge 1 Mobility   Pt agreeable to mobilize this afternoon. Ambulated about 4ft in hall with RW, tolerated well. Pt stated she was experiencing 8/10 pain to the right hip/upper thigh on return trip. Otherwise no complaints.  Left pt in bed, call bell at side. RN/NT notified of session.   [place] Specialist Acute Rehab Services Office: [phone]

## 2021-12-13 NOTE — TOC Progression Note (Addendum)
Transition of Care [facility]) - Progression Note    Patient Details  Name: PAULINE PEGUES MRN: [id number] Date of Birth: [birth year]  Transition of [facility]) CM/SW Contact  Ross Ludwig, [place] Phone Number: 12/13/2021, 5:56 PM  Clinical Narrative:    CSW spoke to patient's daughter Tye [place] and presented bed offers.  Daughter would like to review choices.  Patient's son in law stated that he a retired IT trainer and knows the admissions worker at [facility] and he is going to talk with her to see if they can change their mind.  CSW will contact admissions worker to see if she can review again.  CSW uploaded clinicals to [facility].  CSW to continue to follow patient's progress throughout discharge planning.  Expected Discharge Plan: [place] Barriers to Discharge: Continued Medical Work up  Expected Discharge Plan and Services Expected Discharge Plan: [place]   Discharge Planning Services: CM Consult Post Acute Care Choice: [place] Living arrangements for the past 2 months: Single Family Home                                       Social Determinants of Health (SDOH) Interventions    Readmission Risk Interventions No flowsheet data found.

## 2021-12-13 NOTE — Progress Notes (Signed)
Progress Note   Patient: Nancy [name] YPP:[id number] DOB: 1941/10/30 DOA: 12/09/2021     4  DOS: the patient was seen and examined on 12/13/2021   Brief hospital course: This 81 years old female with PMH significant for rheumatoid arthritis, TIA, uncontrolled hypertension, hyperlipidemia, depression presented to the ED status post mechanical fall.  Patient was in her kitchen and turned around and had lost her balance and had a fall.  She denies any head or neck injury.  She also denies any prior prodromal symptoms.  There is no focal neurological deficits noted at the time of evaluation.  X-ray showed evidence of left subcapital femoral fracture. Orthopedic consulted, Patient underwent ORIF on 12/10/2021.  Postoperative day 2.  She is overall doing much better.  PT recommended skilled nursing facility awaiting insurance authorization.  Assessment and Plan: * Subcapital fracture of neck of left femur, closed, initial encounter ([place])- (present on admission) Patient with history of osteoporosis presented status post mechanical fall. X-ray showed evidence of left subcapital femoral fracture. Orthopedic consulted.  Patient underwent ORIF (left total hip arthroplasty) on 2/1 Postoperative day 3.  Mobilize out of bed with PT. Weightbearing as tolerated.  Lovenox for DVT prophylaxis in hospital. Aspirin 81 mg twice daily for DVT prophylaxis for 6 weeks after discharge. Hold Prolia for at least 6 months postoperatively. Hold Humira for 2 to 4 weeks postoperatively depending upon healing of her incision. Follow-up outpatient in 2 weeks for routine postoperative care.    Neuropathy Continue gabapentin  History of CVA (cerebrovascular accident) Patient reported prior history of TIA/CVA.  MRI did not show prior stroke. Patient stopped taking Plavix on her own 6 months ago. Resume aspirin 81 mg daily  Rheumatoid arthritis ([place])- (present on admission) Patient takes leflunomide,  adalimumab and  CellCept on  scheduled basis and prednisone on as-needed basis. Has not used prednisone more than 2 times a month. Does not require stress dose steroids.. We will keep rheumatoid arthritis medication on hold and will resume postoperatively in 2 to 4 weeks depend upon the healing of incision.  Essential hypertension- (present on admission)  Continue carvedilol, hydralazine and clonidine.  Hold losartan.  Depression- (present on admission) Continue home medications. Mood remains stable.  Iron deficiency anemia- (present on admission) There is no obvious visible bleeding. H&H remains reasonably stable.  Hyperlipidemia- (present on admission) Continue Colestipol  Preop examination-resolved as of 12/11/2021 Preoperative medical evaluation No Coronary revascularization / CVA  In last 5 years. No Recent stress test. She can Climb flight of stair, participates in recreational activity, does household chores. No Prior adverse event with anesthesia. No Alcohol use, drug use.  Cardiac risk: Based on RCRI She is a low risk for adverse Cardiac outcome from surgery. No further work-up necessary prior to surgery.   Subjective: Patient was seen and examined at bedside.  Overnight events noted.  Patient reports feeling much improved.  She reports no pain unless moving her leg.   Physical Exam: Vitals:   12/12/21 2130 12/13/21 0516 12/13/21 0659 12/13/21 0800  BP: (!) 150/75 (!) 178/58 (!) 173/56   Pulse: 67 64 63   Resp: 18 18  16   Temp: 98.3 F (36.8 C) 99.1 F (37.3 C)    TempSrc: Oral Oral    SpO2: 98% 98%    Weight:      Height:        Physical Exam Constitutional:      Appearance: Normal appearance. She is normal weight.  HENT:  Head: Normocephalic and atraumatic.  Cardiovascular:     Rate and Rhythm: Normal rate and regular rhythm.     Heart sounds: Normal heart sounds.  Pulmonary:     Effort: Pulmonary effort is normal.     Breath sounds: Normal breath sounds. No  wheezing or rales.  Abdominal:     General: Abdomen is flat. Bowel sounds are normal.     Palpations: Abdomen is soft.  Musculoskeletal:     Cervical back: Normal range of motion and neck supple.     Comments: Left hip tenderness+ able to move left leg.  Skin:    General: Skin is warm and dry.  Neurological:     General: No focal deficit present.     Mental Status: She is alert and oriented to person, place, and time.  Psychiatric:        Mood and Affect: Mood normal.        Behavior: Behavior normal.     Data Reviewed: I have reviewed vitals, lab results, independently visualized and interpreted imaging x-ray.  I have also reviewed the last note from Dr. Lyla Glassing and Ortho recommendation postoperatively.    Family Communication: No family at bedside.  Disposition: Status is: Inpatient Remains inpatient appropriate because: Admitted s/p fall with right intertrochanteric fracture requiring ORIF.  Postoperative day 4.  Awaiting SNF insurance authorization.    Planned Discharge Destination: Skilled nursing facility  CODE STATUS: Full. DVT prophylaxis: Geralynn Ochs  Time spent: 35 minutes  Author: Shawna Clamp, MD 12/13/2021 11:00 AM  For on call review [email or web address].

## 2021-12-14 LAB — CBC
HCT: 28.5 % — ABNORMAL LOW (ref 36.0–46.0)
Hemoglobin: 9 g/dL — ABNORMAL LOW (ref 12.0–15.0)
MCH: 28.4 pg (ref 26.0–34.0)
MCHC: 31.6 g/dL (ref 30.0–36.0)
MCV: 89.9 fL (ref 80.0–100.0)
Platelets: 201 10*3/uL (ref 150–400)
RBC: 3.17 MIL/uL — ABNORMAL LOW (ref 3.87–5.11)
RDW: 15.9 % — ABNORMAL HIGH (ref 11.5–15.5)
WBC: 9.7 10*3/uL (ref 4.0–10.5)
nRBC: 0 % (ref 0.0–0.2)

## 2021-12-14 MED ORDER — LOSARTAN POTASSIUM 50 MG PO TABS
100.0000 mg | ORAL_TABLET | Freq: Every day | ORAL | Status: DC
  Administered 2021-12-15: 100 mg via ORAL
  Filled 2021-12-14: qty 2

## 2021-12-14 MED ORDER — SODIUM CHLORIDE 0.9 % IV BOLUS
500.0000 mL | Freq: Once | INTRAVENOUS | Status: AC
  Administered 2021-12-14: 500 mL via INTRAVENOUS

## 2021-12-14 MED ORDER — SODIUM CHLORIDE 0.9 % IV SOLN: INTRAVENOUS | Status: DC

## 2021-12-14 NOTE — TOC Progression Note (Addendum)
Transition of Care [facility]) - Progression Note    Patient Details  Name: Nancy [name] MRN: [id number] Date of Birth: 12/21/1940  [facility]) CM/SW Contact  Ross Ludwig, [place] Phone Number: 12/14/2021, 11:55 AM  Clinical Narrative:     CSW received phone call from patient's daughter Nancy [name] and she would like [facility] for short term rehab.  CSW attempted to contact [facility] and had to leave a message to confirm bed availability.  CSW awaiting for a call back.  5:35pm Insurance authorization started for pending SNF placement.  Reference number is [id number].  CSW to continue to follow patient's progress throughout discharge planning.   Expected Discharge Plan: [place] Barriers to Discharge: Continued Medical Work up  Expected Discharge Plan and Services Expected Discharge Plan: [place]   Discharge Planning Services: CM Consult Post Acute Care Choice: [place] Living arrangements for the past 2 months: Single Family Home                                       Social Determinants of Health (SDOH) Interventions    Readmission Risk Interventions No flowsheet data found.

## 2021-12-14 NOTE — Progress Notes (Signed)
PHYSICAL THERAPY  Assisted OOB to amb in hallway approx 18 feet when pt started to c/o "feeling weak" with B knees buckle had to sit pt on my knee to prevent total fall.  RN assisted Games developer.  Quickly placed in recliner fully supine.  BP in vital section Epic.  Standing BP 71/34 with HR 114. Lefdt pt in recliner with RN in room.  Rica Koyanagi  PTA Acute  Rehabilitation Services Pager      [phone] Office      [phone]

## 2021-12-14 NOTE — Progress Notes (Signed)
Physical Therapy Treatment Patient Details Name: Nancy [name] MRN: [id number] DOB: 18-Jan-1941 Today's Date: 12/14/2021   History of Present Illness 81 years old female with PMH significant for rheumatoid arthritis, TIA, uncontrolled hypertension, hyperlipidemia, depression presented to the ED status post mechanical fall and sustained left subcapital femoral fracture.  Pt s/p left THA direct anterior approach on 12/10/21    PT Comments    POD # 4 am session Pt AxO x 3 very pleasant Lady.  Assisted to EOB required Max Assist and use of bed pad to complete scooting to EOB.  Pt c/o max fatigue and a little bit nausea.  Assisted with amb.  General Gait Details: started to amb from bed into hallway when at approx 18 feet pt started to collapse, stating she didn'd feel good.  RN called to retrieve recliner.  Had to sit pt on my knee to prevent total fall to floor.Total Assist to place pt in recliner then fully lay flat as RN grabbed Dynamate.  BP laying was 126/56 HR 114.  Assisted with standing again with RN BP dropped to 71/34. Returned to room in recliner pt started to feel a little bit better.  RN paging MD .   Recommendations for follow up therapy are one component of a multi-disciplinary discharge planning process, led by the attending physician.  Recommendations may be updated based on patient status, additional functional criteria and insurance authorization.  Follow Up Recommendations  Skilled nursing-short term rehab (<3 hours/day)     Assistance Recommended at Discharge    Patient can return home with the following A little help with walking and/or transfers;A little help with bathing/dressing/bathroom;Assist for transportation;Assistance with cooking/housework   Equipment Recommendations  None recommended by PT    Recommendations for Other Services       Precautions / Restrictions Precautions Precautions: None Restrictions Weight Bearing Restrictions: No Other Position/Activity  Restrictions: WBAT     Mobility  Bed Mobility Overal bed mobility: Needs Assistance Bed Mobility: Supine to Sit     Supine to sit: HOB elevated, Mod assist     General bed mobility comments: assist for Lt LE over EOB and trunk upright    Transfers Overall transfer level: Needs assistance Equipment used: Rolling walker (2 wheels) Transfers: Sit to/from Stand Sit to Stand: Min assist, Mod assist           General transfer comment: verbal cues for UE and LE positioning; assist to steady with rise and control descent    Ambulation/Gait Ambulation/Gait assistance: Min assist Gait Distance (Feet): 18 Feet   Gait Pattern/deviations: Step-to pattern, Decreased stance time - left, Antalgic Gait velocity: decreased     General Gait Details: started to amb from bed into hallway when at approx 18 feet pt srated to collapse, stating she didn'd feel good.  RN called to retrieve recliner.  Had to sit pt on my knee to prevent total fall to floor.Total Assist to place pt in recliner then fully lay flat as RN grabbed Dynamate.  BP laying was 126/56 HR 114.  Assisted with standing again with RN BP dropped to 71/34.   Stairs             Wheelchair Mobility    Modified Rankin (Stroke Patients Only)       Balance  Cognition Arousal/Alertness: Awake/alert Behavior During Therapy: WFL for tasks assessed/performed Overall Cognitive Status: Within Functional Limits for tasks assessed                                 General Comments: AxO x 3 very pleasant lady        Exercises      General Comments        Pertinent Vitals/Pain Pain Assessment Pain Assessment: Faces Faces Pain Scale: Hurts a little bit Pain Location: L hip Pain Descriptors / Indicators: Grimacing, Discomfort, Operative site guarding Pain Intervention(s): Monitored during session, Premedicated before session, Repositioned,  Ice applied    Home Living                          Prior Function            PT Goals (current goals can now be found in the care plan section) Progress towards PT goals: Progressing toward goals    Frequency    Min 5X/week      PT Plan Current plan remains appropriate    Co-evaluation              AM-PAC PT "6 Clicks" Mobility   Outcome Measure  Help needed turning from your back to your side while in a flat bed without using bedrails?: A Lot Help needed moving from lying on your back to sitting on the side of a flat bed without using bedrails?: A Lot Help needed moving to and from a bed to a chair (including a wheelchair)?: A Lot Help needed standing up from a chair using your arms (e.g., wheelchair or bedside chair)?: Total Help needed to walk in hospital room?: Total Help needed climbing 3-5 steps with a railing? : Total 6 Click Score: 9    End of Session Equipment Utilized During Treatment: Gait belt Activity Tolerance: Other (comment) (orthostatic) Patient left: in chair;with call bell/phone within reach;with chair alarm set Nurse Communication: Mobility status PT Visit Diagnosis: Difficulty in walking, not elsewhere classified (R26.2)     Time: 6945-0388 PT Time Calculation (min) (ACUTE ONLY): 47 min  Charges:  $Gait Training: 8-22 mins $Therapeutic Activity: 23-37 mins                     Rica Koyanagi  PTA Acute  Rehabilitation Services Pager      [phone] Office      [phone]

## 2021-12-14 NOTE — Progress Notes (Signed)
Progress Note   Patient: Nancy [name] SEG:[id number] DOB: 1940-11-20 DOA: 12/09/2021     5 DOS: the patient was seen and examined on 12/14/2021   Brief hospital course: This 81 years old female with PMH significant for rheumatoid arthritis, TIA, uncontrolled hypertension, hyperlipidemia, depression presented to the ED status post mechanical fall.  Patient was in her kitchen and turned around and had lost her balance and had a fall.  She denies any head or neck injury.  She also denies any prior prodromal symptoms.  There is no focal neurological deficits noted at the time of evaluation.  X-ray showed evidence of left subcapital femoral fracture. Orthopedic consulted, Patient underwent ORIF on 12/10/2021.  Postoperative day 2.  She is overall doing much better.  PT recommended skilled nursing facility,  awaiting insurance authorization.  Assessment and Plan: * Subcapital fracture of neck of left femur, closed, initial encounter ([place])- (present on admission) Patient with history of osteoporosis presented status post mechanical fall. X-ray showed evidence of left subcapital femoral fracture. Orthopedic consulted.  Patient underwent ORIF (left total hip arthroplasty) on 2/1 Postoperative day 4.  Mobilize out of bed with PT. Weightbearing as tolerated.  Lovenox for DVT prophylaxis in hospital. Aspirin 81 mg twice daily for DVT prophylaxis for 6 weeks after discharge. Hold Prolia for at least 6 months postoperatively. Hold Humira for 2 to 4 weeks postoperatively depending upon healing of her incision. Follow-up outpatient in 2 weeks for routine postoperative care. Patient is awaiting SNF insurance authorization.    Neuropathy Continue gabapentin  History of CVA (cerebrovascular accident) Patient reported prior history of TIA/CVA.  MRI did not show prior stroke. Patient stopped taking Plavix on her own 6 months ago. Resume aspirin 81 mg twice daily for 6 weeks and then daily  Rheumatoid  arthritis ([place])- (present on admission) Patient takes leflunomide,  adalimumab and CellCept on  scheduled basis and prednisone on as-needed basis. Has not used prednisone more than 2 times a month. Does not require stress dose steroids.. Hold RA medications and will resume postoperatively in 2 to 4 weeks depend upon the healing of incision.  Essential hypertension- (present on admission)  Continue carvedilol, hydralazine and clonidine.   Resume Losartan, continue to monitor blood pressure.  Depression- (present on admission) Continue home medications. Mood remains stable.  Iron deficiency anemia- (present on admission) There is no obvious visible bleeding. H&H remains reasonably stable.  Hyperlipidemia- (present on admission) Continue Colestipol  Preop examination-resolved as of 12/11/2021 Preoperative medical evaluation No Coronary revascularization / CVA  In last 5 years. No Recent stress test. She can Climb flight of stair, participates in recreational activity, does household chores. No Prior adverse event with anesthesia. No Alcohol use, drug use.  Cardiac risk: Based on RCRI She is a low risk for adverse Cardiac outcome from surgery. No further work-up necessary prior to surgery.   Subjective:  Patient was seen and examined at bedside.  Overnight events noted.   Patient reports feeling much improved.  She reports pain is better controlled,  She has participated in physical therapy yesterday,  did well.  Physical Exam: Vitals:   12/13/21 2056 12/14/21 0444 12/14/21 0650 12/14/21 0817  BP: (!) 162/55 (!) 177/60 (!) 152/48   Pulse: 70 (!) 59 61   Resp: 20 20    Temp: 99.5 F (37.5 C) 98.7 F (37.1 C)  99 F (37.2 C)  TempSrc: Oral Oral  Oral  SpO2: 98% 100%    Weight:  Height:       Physical Exam Vitals and nursing note reviewed.  Constitutional:      Appearance: Normal appearance. She is normal weight.  HENT:     Head: Normocephalic and atraumatic.   Eyes:     Conjunctiva/sclera: Conjunctivae normal.  Cardiovascular:     Rate and Rhythm: Normal rate and regular rhythm.     Pulses: Normal pulses.     Heart sounds: Normal heart sounds.  Pulmonary:     Effort: Pulmonary effort is normal.     Breath sounds: Normal breath sounds.  Abdominal:     General: Abdomen is flat. Bowel sounds are normal.     Palpations: Abdomen is soft.  Musculoskeletal:     Cervical back: Normal range of motion and neck supple.     Comments: Left thigh tenderness: Improved, able to move left leg  Skin:    General: Skin is warm and dry.  Neurological:     General: No focal deficit present.     Mental Status: She is alert and oriented to person, place, and time.  Psychiatric:        Mood and Affect: Mood normal.        Behavior: Behavior normal.     Data Reviewed: Data reviewed include labs, nursing notes, vitals, imaging, independently interpreted, plan of care discussed in detail with the patient.  Family Communication: No family at bedside  Disposition: Status is: Inpatient Remains inpatient appropriate because:  Admitted for left leg pain s/p fall, found to have left femoral fracture. She underwent ORIF. Patient is medically clear, awaiting SNF placement.  Planned Discharge Destination: Skilled nursing facility.  DVT prophylaxis: Lovenox CODE STATUS: Full code   Time spent: 35 minutes  Author: Shawna Clamp, MD 12/14/2021 10:09 AM  For on call review [email or web address].

## 2021-12-15 ENCOUNTER — Ambulatory Visit: Payer: Medicare Other | Admitting: Family

## 2021-12-15 DIAGNOSIS — I69828 Other speech and language deficits following other cerebrovascular disease: Secondary | ICD-10-CM | POA: Diagnosis not present

## 2021-12-15 DIAGNOSIS — E538 Deficiency of other specified B group vitamins: Secondary | ICD-10-CM | POA: Diagnosis not present

## 2021-12-15 DIAGNOSIS — R2689 Other abnormalities of gait and mobility: Secondary | ICD-10-CM | POA: Diagnosis not present

## 2021-12-15 DIAGNOSIS — E039 Hypothyroidism, unspecified: Secondary | ICD-10-CM | POA: Diagnosis not present

## 2021-12-15 DIAGNOSIS — E119 Type 2 diabetes mellitus without complications: Secondary | ICD-10-CM | POA: Diagnosis not present

## 2021-12-15 DIAGNOSIS — S72002D Fracture of unspecified part of neck of left femur, subsequent encounter for closed fracture with routine healing: Secondary | ICD-10-CM | POA: Diagnosis not present

## 2021-12-15 DIAGNOSIS — D509 Iron deficiency anemia, unspecified: Secondary | ICD-10-CM | POA: Diagnosis not present

## 2021-12-15 DIAGNOSIS — I10 Essential (primary) hypertension: Secondary | ICD-10-CM | POA: Diagnosis not present

## 2021-12-15 DIAGNOSIS — E559 Vitamin D deficiency, unspecified: Secondary | ICD-10-CM | POA: Diagnosis not present

## 2021-12-15 DIAGNOSIS — R410 Disorientation, unspecified: Secondary | ICD-10-CM | POA: Diagnosis not present

## 2021-12-15 DIAGNOSIS — R2681 Unsteadiness on feet: Secondary | ICD-10-CM | POA: Diagnosis not present

## 2021-12-15 DIAGNOSIS — S72002A Fracture of unspecified part of neck of left femur, initial encounter for closed fracture: Secondary | ICD-10-CM | POA: Diagnosis not present

## 2021-12-15 DIAGNOSIS — S79929A Unspecified injury of unspecified thigh, initial encounter: Secondary | ICD-10-CM | POA: Diagnosis not present

## 2021-12-15 DIAGNOSIS — G629 Polyneuropathy, unspecified: Secondary | ICD-10-CM | POA: Diagnosis not present

## 2021-12-15 DIAGNOSIS — Z96642 Presence of left artificial hip joint: Secondary | ICD-10-CM | POA: Diagnosis not present

## 2021-12-15 DIAGNOSIS — M6281 Muscle weakness (generalized): Secondary | ICD-10-CM | POA: Diagnosis not present

## 2021-12-15 DIAGNOSIS — E785 Hyperlipidemia, unspecified: Secondary | ICD-10-CM | POA: Diagnosis not present

## 2021-12-15 DIAGNOSIS — S72012A Unspecified intracapsular fracture of left femur, initial encounter for closed fracture: Secondary | ICD-10-CM | POA: Diagnosis not present

## 2021-12-15 DIAGNOSIS — I69398 Other sequelae of cerebral infarction: Secondary | ICD-10-CM | POA: Diagnosis not present

## 2021-12-15 DIAGNOSIS — M8000XA Age-related osteoporosis with current pathological fracture, unspecified site, initial encounter for fracture: Secondary | ICD-10-CM | POA: Diagnosis not present

## 2021-12-15 DIAGNOSIS — M069 Rheumatoid arthritis, unspecified: Secondary | ICD-10-CM | POA: Diagnosis not present

## 2021-12-15 DIAGNOSIS — M810 Age-related osteoporosis without current pathological fracture: Secondary | ICD-10-CM | POA: Diagnosis not present

## 2021-12-15 DIAGNOSIS — Z7401 Bed confinement status: Secondary | ICD-10-CM | POA: Diagnosis not present

## 2021-12-15 LAB — BASIC METABOLIC PANEL
Anion gap: 6 (ref 5–15)
BUN: 16 mg/dL (ref 8–23)
CO2: 24 mmol/L (ref 22–32)
Calcium: 8.4 mg/dL — ABNORMAL LOW (ref 8.9–10.3)
Chloride: 107 mmol/L (ref 98–111)
Creatinine, Ser: 0.63 mg/dL (ref 0.44–1.00)
GFR, Estimated: >60 mL/min (ref >60)
Glucose, Bld: 96 mg/dL (ref 70–99)
Potassium: 3.9 mmol/L (ref 3.5–5.1)
Sodium: 137 mmol/L (ref 135–145)

## 2021-12-15 LAB — RESP PANEL BY RT-PCR (FLU A&B, COVID) ARPGX2
Influenza A by PCR: NEGATIVE
Influenza B by PCR: NEGATIVE
SARS Coronavirus 2 by RT PCR: NEGATIVE

## 2021-12-15 LAB — HEMOGLOBIN AND HEMATOCRIT, BLOOD
HCT: 25.3 % — ABNORMAL LOW (ref 36.0–46.0)
Hemoglobin: 8.3 g/dL — ABNORMAL LOW (ref 12.0–15.0)

## 2021-12-15 LAB — PHOSPHORUS: Phosphorus: 3.2 mg/dL (ref 2.5–4.6)

## 2021-12-15 LAB — MAGNESIUM: Magnesium: 1.7 mg/dL (ref 1.7–2.4)

## 2021-12-15 NOTE — Discharge Summary (Signed)
Physician Discharge Summary   Patient: Nancy [name] MRN: [id number] DOB: [birth year]  Admit date:     12/09/2021  Discharge date: 12/15/21  Discharge Physician: Shawna Clamp   PCP: Debbrah Alar, NP   Recommendations at discharge:  Advised to follow-up with primary care physician in 1 week. Advised to follow-up with orthopedics in 2 weeks. Advised to take aspirin 81 mg twice a day for 6 weeks after discharge. Advised to hold RA medications for 2-4 weeks until wound heals. Advised to hold Prolia for 6 month post operatively.  Discharge Diagnoses: Principal Problem:   Subcapital fracture of neck of left femur, closed, initial encounter ([place]) Active Problems:   Hyperlipidemia   Iron deficiency anemia   Depression   Essential hypertension   Osteoporosis   Rheumatoid arthritis ([place])   History of CVA (cerebrovascular accident)   Neuropathy  Resolved Problems:   Preop examination   Hospital Course: This 81 years old female with PMH significant for rheumatoid arthritis, TIA, uncontrolled hypertension, hyperlipidemia, depression presented to the ED status post mechanical fall. Patient was in her kitchen and turned around and had lost her balance and had a fall.  She denies any head or neck injury.  She also denies any prior prodromal symptoms.  There is no focal neurological deficits noted at the time of evaluation.  X-ray showed evidence of left subcapital femoral fracture. Orthopedic consulted, Patient underwent ORIF on 12/10/2021.  Postoperative day 5.  She is overall doing much better.  PT recommended skilled nursing facility, patient's blood pressure medications were adjusted.  Patient is doing much better and wants to be discharged.  Patient being discharged to SNF.  She was managed for below problems.  Assessment and Plan: * Subcapital fracture of neck of left femur, closed, initial encounter ([place])- (present on admission) Patient with history of osteoporosis presented status  post mechanical fall. X-ray showed evidence of left subcapital femoral fracture. Orthopedic consulted.  Patient underwent ORIF (left total hip arthroplasty) on 2/1 Postoperative day 5.  Mobilize out of bed with PT. Weightbearing as tolerated.  Lovenox for DVT prophylaxis in hospital. Aspirin 81 mg twice daily for DVT prophylaxis for 6 weeks after discharge. Hold Prolia for at least 6 months postoperatively. Hold Humira for 2 to 4 weeks postoperatively depending upon healing of her incision. Follow-up outpatient in 2 weeks for routine postoperative care. Patient being discharged to SNF today.    Neuropathy Continue gabapentin  History of CVA (cerebrovascular accident) Patient reported prior history of TIA/CVA.  MRI did not show prior stroke. Patient stopped taking Plavix on her own 6 months ago. Resume aspirin 81 mg twice daily for 6 weeks and then daily  Rheumatoid arthritis ([place])- (present on admission) Patient takes leflunomide,  adalimumab and CellCept on  scheduled basis and prednisone on as-needed basis. Has not used prednisone more than 2 times a month. Does not require stress dose steroids.. Hold RA medications and will resume postoperatively in 2 to 4 weeks depend upon the healing of incision.  Essential hypertension- (present on admission) Continue carvedilol, losartan and clonidine. Resume hydralazine if blood pressure remains elevated.   Depression- (present on admission) Continue home medications. Mood remains stable.  Iron deficiency anemia- (present on admission) There is no obvious visible bleeding. H&H remains reasonably stable.  Hyperlipidemia- (present on admission) Continue Colestipol  Preop examination-resolved as of 12/11/2021 Preoperative medical evaluation No Coronary revascularization / CVA  In last 5 years. No Recent stress test. She can Climb flight of stair, participates in  recreational activity, does household chores. No Prior adverse event  with anesthesia. No Alcohol use, drug use.  Cardiac risk: Based on RCRI She is a low risk for adverse Cardiac outcome from surgery. No further work-up necessary prior to surgery.   Pain control - [employer] Controlled Substance Reporting System database was reviewed. and patient was instructed, not to drive, operate heavy machinery, perform activities at heights, swimming or participation in water activities or provide baby-sitting services while on Pain, Sleep and Anxiety Medications; until their outpatient Physician has advised to do so again. Also recommended to not to take more than prescribed Pain, Sleep and Anxiety Medications.   Consultants: Orthopedics Procedures performed: ORIF Disposition: Skilled nursing facility  Diet recommendation:  Discharge Diet Orders (From admission, onward)     Start     Ordered   12/15/21 0000  Diet - low sodium heart healthy        12/15/21 1029   12/15/21 0000  Diet Carb Modified        12/15/21 1029           Carb modified diet  DISCHARGE MEDICATION: Allergies as of 12/15/2021       Reactions   Iron Nausea And Vomiting   Can take infusions        Medication List     STOP taking these medications    Adalimumab 40 MG/0.4ML Pnkt   clopidogrel 75 MG tablet Commonly known as: PLAVIX   hydrALAZINE 25 MG tablet Commonly known as: APRESOLINE   leflunomide 20 MG tablet Commonly known as: Arava   mycophenolate 500 MG tablet Commonly known as: CELLCEPT   naproxen sodium 220 MG tablet Commonly known as: ALEVE   predniSONE 5 MG tablet Commonly known as: DELTASONE   Prolia 60 MG/ML Sosy injection Generic drug: denosumab       TAKE these medications    acetaminophen 500 MG tablet Commonly known as: TYLENOL Take 500 mg by mouth every 6 (six) hours as needed for moderate pain.   albuterol 108 (90 Base) MCG/ACT inhaler Commonly known as: VENTOLIN HFA Inhale 2 puffs into the lungs every 6 (six) hours as needed for  wheezing or shortness of breath.   aspirin 81 MG chewable tablet Commonly known as: Aspirin Childrens Chew 1 tablet (81 mg total) by mouth 2 (two) times daily with a meal.   carvedilol 25 MG tablet Commonly known as: COREG TAKE 1 TABLET BY MOUTH TWICE A DAY What changed: when to take this   cloNIDine 0.1 MG tablet Commonly known as: CATAPRES TAKE 1 TABLET BY MOUTH 3 TIMES DAILY.   fluticasone 50 MCG/ACT nasal spray Commonly known as: FLONASE Place 1 spray into both nostrils daily as needed for allergies or rhinitis.   gabapentin 100 MG capsule Commonly known as: NEURONTIN Take 1 capsule (100 mg total) by mouth 3 (three) times daily.   HYDROcodone-acetaminophen 5-325 MG tablet Commonly known as: NORCO/VICODIN Take 1 tablet by mouth every 4 (four) hours as needed for moderate pain.   losartan 100 MG tablet Commonly known as: COZAAR TAKE 1 TABLET BY MOUTH EVERY DAY   polyethylene glycol powder 17 GM/SCOOP powder Commonly known as: GLYCOLAX/MIRALAX Take 17 g by mouth daily. What changed:  when to take this reasons to take this   prednisoLONE acetate 1 % ophthalmic suspension Commonly known as: PRED FORTE Place 1 drop into both eyes daily.               Discharge Care Instructions  (  From admission, onward)           Start     Ordered   12/15/21 0000  Discharge wound care:       Comments: Follow-up wound care at nursing home.   12/15/21 1029            Contact information for follow-up providers     Swinteck, Aaron Edelman, MD. Schedule an appointment as soon as possible for a visit in 2 week(s).   Specialty: Orthopedic Surgery Why: For suture removal, For wound re-check Contact information: [address] [address] [phone]         Debbrah Alar, NP Follow up in 1 week(s).   Specialty: Internal Medicine Contact information: [place] [address] [phone]               Contact information for after-discharge care     Destination     [facility] Preferred SNF .   Service: Skilled Nursing Contact information: [place] [phone]                    Subjective: Patient was seen and examined at bedside.  Overnight events noted.   Patient reports feeling much improved.  She has participated in therapy,  feels better.  Discharge Exam: Filed Weights   12/09/21 1105  Weight: 52.2 kg   Physical Exam Constitutional:      Appearance: Normal appearance. She is normal weight.  HENT:     Head: Atraumatic.  Eyes:     Conjunctiva/sclera: Conjunctivae normal.  Cardiovascular:     Rate and Rhythm: Normal rate and regular rhythm.     Pulses: Normal pulses.     Heart sounds: Normal heart sounds.  Pulmonary:     Effort: Pulmonary effort is normal.     Breath sounds: Normal breath sounds.  Abdominal:     General: Abdomen is flat. Bowel sounds are normal.     Palpations: Abdomen is soft.  Musculoskeletal:     Comments: Mild left hip tenderness+, ROM improved  Neurological:     General: No focal deficit present.     Mental Status: She is alert and oriented to person, place, and time.     Condition at discharge: stable  The results of significant diagnostics from this hospitalization (including imaging, microbiology, ancillary and laboratory) are listed below for reference.   Imaging Studies: DG Chest 1 View  Result Date: 12/09/2021 CLINICAL DATA:  Fall. EXAM: CHEST  1 VIEW COMPARISON:  August 22, 2015. FINDINGS: The heart size and mediastinal contours are within normal limits. Both lungs are clear. The visualized skeletal structures are unremarkable. IMPRESSION: No active disease. Electronically Signed   By: Marijo Conception M.D.   On: 12/09/2021 11:56   Pelvis Portable  Result Date: 12/10/2021 CLINICAL DATA:  Left hip replacement. EXAM: PORTABLE PELVIS 1-2 VIEWS COMPARISON:  Left  hip x-ray 12/09/2021. FINDINGS: There is a new left hip arthroplasty in anatomic alignment. There is hip soft tissue swelling and air with skin staples compatible with recent surgery. IMPRESSION: 1. New left hip arthroplasty in anatomic alignment. Electronically Signed   By: Ronney Asters M.D.   On: 12/10/2021 17:29   DG C-Arm 1-60 Min-No Report  Result Date: 12/10/2021 Fluoroscopy was utilized by the requesting physician.  No radiographic interpretation.   DG C-Arm 1-60 Min-No Report  Result Date: 12/10/2021 Fluoroscopy was utilized by  the requesting physician.  No radiographic interpretation.   DG HIP OPERATIVE UNILAT WITH PELVIS LEFT  Result Date: 12/10/2021 CLINICAL DATA:  Total hip arthroplasty. EXAM: OPERATIVE LEFT HIP (WITH PELVIS IF PERFORMED)  VIEWS TECHNIQUE: Fluoroscopic spot image(s) were submitted for interpretation post-operatively. FLUOROSCOPY TIME:  Radiation Exposure Index (as provided by the fluoroscopic device): 1.5 mGy Kerma COMPARISON:  Left hip x-rays from yesterday. FINDINGS: Two intraoperative fluoroscopic images demonstrate interval left total hip arthroplasty. Components are well aligned. No acute osseous abnormality. IMPRESSION: 1. Intraoperative fluoroscopic guidance for left total hip arthroplasty. Electronically Signed   By: Titus Dubin M.D.   On: 12/10/2021 16:36   DG HIP UNILAT WITH PELVIS 2-3 VIEWS LEFT  Result Date: 12/09/2021 CLINICAL DATA:  Fall, left hip pain EXAM: DG HIP (WITH OR WITHOUT PELVIS) 2-3V LEFT; LEFT FEMUR 2 VIEWS COMPARISON:  CT abdomen/pelvis 03/27/2020 FINDINGS: Hip: There is a minimally displaced subcapital left femoral neck fracture. The femoral head remains seated within the acetabulum. No fracture is seen on the right. The SI joints and symphysis pubis are intact. There is mild degenerative change about both hips. Femur: No other fracture is seen. Knee alignment is maintained. There is degenerative change of the knee joint, primarily affecting  the medial compartment. The soft tissues are unremarkable. IMPRESSION: Minimally displaced subcapital left femoral neck fracture. Electronically Signed   By: Valetta Mole M.D.   On: 12/09/2021 11:56   DG FEMUR MIN 2 VIEWS LEFT  Result Date: 12/09/2021 CLINICAL DATA:  Fall, left hip pain EXAM: DG HIP (WITH OR WITHOUT PELVIS) 2-3V LEFT; LEFT FEMUR 2 VIEWS COMPARISON:  CT abdomen/pelvis 03/27/2020 FINDINGS: Hip: There is a minimally displaced subcapital left femoral neck fracture. The femoral head remains seated within the acetabulum. No fracture is seen on the right. The SI joints and symphysis pubis are intact. There is mild degenerative change about both hips. Femur: No other fracture is seen. Knee alignment is maintained. There is degenerative change of the knee joint, primarily affecting the medial compartment. The soft tissues are unremarkable. IMPRESSION: Minimally displaced subcapital left femoral neck fracture. Electronically Signed   By: Valetta Mole M.D.   On: 12/09/2021 11:56    Microbiology: Results for orders placed or performed during the hospital encounter of 12/09/21  Resp Panel by RT-PCR (Flu A&B, Covid) Nasopharyngeal Swab     Status: None   Collection Time: 12/09/21 11:26 AM   Specimen: Nasopharyngeal Swab; Nasopharyngeal(NP) swabs in vial transport medium  Result Value Ref Range Status   SARS Coronavirus 2 by RT PCR NEGATIVE NEGATIVE Final    Comment: (NOTE) SARS-CoV-2 target nucleic acids are NOT DETECTED.  The SARS-CoV-2 RNA is generally detectable in upper respiratory specimens during the acute phase of infection. The lowest concentration of SARS-CoV-2 viral copies this assay can detect is 138 copies/mL. A negative result does not preclude SARS-Cov-2 infection and should not be used as the sole basis for treatment or other patient management decisions. A negative result may occur with  improper specimen collection/handling, submission of specimen other than nasopharyngeal  swab, presence of viral mutation(s) within the areas targeted by this assay, and inadequate number of viral copies(<138 copies/mL). A negative result must be combined with clinical observations, patient history, and epidemiological information. The expected result is Negative.  Fact Sheet for Patients:  EntrepreneurPulse.com.au  Fact Sheet for Healthcare Providers:  IncredibleEmployment.be  This test is no t yet approved or cleared by the [place] FDA and  has been authorized for detection and/or  diagnosis of SARS-CoV-2 by FDA under an Emergency Use Authorization (EUA). This EUA will remain  in effect (meaning this test can be used) for the duration of the COVID-19 declaration under Section 564(b)(1) of the Act, 21 U.S.C.section 360bbb-3(b)(1), unless the authorization is terminated  or revoked sooner.       Influenza A by PCR NEGATIVE NEGATIVE Final   Influenza B by PCR NEGATIVE NEGATIVE Final    Comment: (NOTE) The Xpert Xpress SARS-CoV-2/FLU/RSV plus assay is intended as an aid in the diagnosis of influenza from Nasopharyngeal swab specimens and should not be used as a sole basis for treatment. Nasal washings and aspirates are unacceptable for Xpert Xpress SARS-CoV-2/FLU/RSV testing.  Fact Sheet for Patients: EntrepreneurPulse.com.au  Fact Sheet for Healthcare Providers: IncredibleEmployment.be  This test is not yet approved or cleared by the [place] FDA and has been authorized for detection and/or diagnosis of SARS-CoV-2 by FDA under an Emergency Use Authorization (EUA). This EUA will remain in effect (meaning this test can be used) for the duration of the COVID-19 declaration under Section 564(b)(1) of the Act, 21 U.S.C. section 360bbb-3(b)(1), unless the authorization is terminated or revoked.  Performed at [facility], [address], [place], [place] [address]    Surgical PCR screen     Status: None   Collection Time: 12/09/21  9:32 PM   Specimen: Nasal Mucosa; Nasal Swab  Result Value Ref Range Status   MRSA, PCR NEGATIVE NEGATIVE Final   Staphylococcus aureus NEGATIVE NEGATIVE Final    Comment: (NOTE) The Xpert SA Assay (FDA approved for NASAL specimens in patients 26 years of age and older), is one component of a comprehensive surveillance program. It is not intended to diagnose infection nor to guide or monitor treatment. Performed at [facility], [address] [place], [place] [address]     Labs: CBC: Recent Labs  Lab 12/10/21 [phone] 12/11/21 0452 12/12/21 0432 12/12/21 0810 12/13/21 0604 12/14/21 0530 12/15/21 0439  WBC 10.6* 14.5* 11.4*  --  11.0* 9.7  --   HGB 11.4* 10.3* 8.3* 8.8* 8.3* 9.0* 8.3*  HCT 37.2 33.0* 26.6* 27.1* 25.7* 28.5* 25.3*  MCV 91.4 90.7 90.5  --  88.9 89.9  --   PLT 194 163 148*  --  158 201  --    Basic Metabolic Panel: Recent Labs  Lab 12/09/21 1126 12/10/21 0445 12/11/21 0452 12/12/21 0432 12/15/21 0439  NA 139 133* 132* 136 137  K 3.9 4.7 4.3 4.8 3.9  CL 104 99 103 107 107  CO2 28 25 22 23 24   GLUCOSE 107* 118* 143* 129* 96  BUN 14 18 24* 20 16  CREATININE 0.79 0.84 0.99 0.70 0.63  CALCIUM 9.4 9.2 8.2* 7.8* 8.4*  MG  --   --   --   --  1.7  PHOS  --   --   --   --  3.2   Liver Function Tests: No results for input(s): AST, ALT, ALKPHOS, BILITOT, PROT, ALBUMIN in the last 168 hours. CBG: No results for input(s): GLUCAP in the last 168 hours.  Discharge time spent: greater than 30 minutes.  Signed: Shawna Clamp, MD [facility] 12/15/2021

## 2021-12-15 NOTE — Progress Notes (Signed)
Physical Therapy Treatment Patient Details Name: Nancy [name] MRN: [id number] DOB: July 15, 1941 Today's Date: 12/15/2021   History of Present Illness 81 years old female with PMH significant for rheumatoid arthritis, TIA, uncontrolled hypertension, hyperlipidemia, depression presented to the ED status post mechanical fall and sustained left subcapital femoral fracture.  Pt s/p left THA direct anterior approach on 12/10/21    PT Comments    Pt assisted with ambulating in hallway.  Pt still requiring min-mod assist for mobility however denies any dizziness today.  Pt to d/c to SNF.   Recommendations for follow up therapy are one component of a multi-disciplinary discharge planning process, led by the attending physician.  Recommendations may be updated based on patient status, additional functional criteria and insurance authorization.  Follow Up Recommendations  Skilled nursing-short term rehab (<3 hours/day)     Assistance Recommended at Discharge    Patient can return home with the following A little help with walking and/or transfers;A little help with bathing/dressing/bathroom;Assist for transportation;Assistance with cooking/housework   Equipment Recommendations  None recommended by PT    Recommendations for Other Services       Precautions / Restrictions Precautions Precautions: None Restrictions Weight Bearing Restrictions: No Other Position/Activity Restrictions: WBAT     Mobility  Bed Mobility Overal bed mobility: Needs Assistance Bed Mobility: Supine to Sit     Supine to sit: Mod assist, HOB elevated     General bed mobility comments: assist for Lt LE over EOB and trunk upright    Transfers Overall transfer level: Needs assistance Equipment used: Rolling walker (2 wheels) Transfers: Sit to/from Stand Sit to Stand: Min assist           General transfer comment: verbal cues for UE and LE positioning; assist to steady with rise and control descent     Ambulation/Gait Ambulation/Gait assistance: Min assist Gait Distance (Feet): 120 Feet Assistive device: Rolling walker (2 wheels) Gait Pattern/deviations: Step-to pattern, Decreased stance time - left, Antalgic       General Gait Details: pt with ataxic gait observed, pt c/o of pain today however distance to tolerance, also denies any dizziness, cues for RW positioning, posture, step length; assist for occasional steady   Stairs             Wheelchair Mobility    Modified Rankin (Stroke Patients Only)       Balance                                            Cognition Arousal/Alertness: Awake/alert Behavior During Therapy: WFL for tasks assessed/performed Overall Cognitive Status: Within Functional Limits for tasks assessed                                          Exercises      General Comments        Pertinent Vitals/Pain Pain Assessment Pain Assessment: Faces Faces Pain Scale: Hurts even more Pain Location: L hip Pain Descriptors / Indicators: Grimacing, Discomfort, Operative site guarding Pain Intervention(s): Repositioned, Monitored during session    Home Living                          Prior Function  PT Goals (current goals can now be found in the care plan section) Progress towards PT goals: Progressing toward goals    Frequency    Min 5X/week      PT Plan Current plan remains appropriate    Co-evaluation              AM-PAC PT "6 Clicks" Mobility   Outcome Measure  Help needed turning from your back to your side while in a flat bed without using bedrails?: A Lot Help needed moving from lying on your back to sitting on the side of a flat bed without using bedrails?: A Lot Help needed moving to and from a bed to a chair (including a wheelchair)?: A Lot Help needed standing up from a chair using your arms (e.g., wheelchair or bedside chair)?: A Little Help needed to  walk in hospital room?: A Little Help needed climbing 3-5 steps with a railing? : A Lot 6 Click Score: 14    End of Session Equipment Utilized During Treatment: Gait belt Activity Tolerance: Patient tolerated treatment well Patient left: in chair;with call bell/phone within reach Nurse Communication: Mobility status PT Visit Diagnosis: Difficulty in walking, not elsewhere classified (R26.2)     Time: 0051-1021 PT Time Calculation (min) (ACUTE ONLY): 17 min  Charges:  $Gait Training: 8-22 mins                    Arlyce Dice, DPT Acute Rehabilitation Services Pager: [phone] Office: [place] 12/15/2021, 12:37 PM

## 2021-12-15 NOTE — TOC Transition Note (Addendum)
Transition of Care [facility]) - CM/SW Discharge Note   Patient Details  Name: Nancy [name] MRN: [id number] Date of Birth: 1941-08-03  Transition of [facility] CM/SW Contact:  Dessa Phi, RN Phone Number: 12/15/2021, 10:27 AM   Clinical Narrative: Aida Puffer, & provided [facility] health updated SNF-[facility] [place]#[id number]. Nikki rep aware of d/c today,has a bed.Awaiting covid results,& d/c summary.MD updated.  -2p-covid results back neg. PTAR called. Going to rm#108,nsg report tel#[phone]. No further CM needs.     Final next level of care: Skilled Nursing Facility Barriers to Discharge: No Barriers Identified   Patient Goals and CMS Choice Patient states their goals for this hospitalization and ongoing recovery are:: Home CMS Medicare.gov Compare Post Acute Care list provided to:: Patient Represenative (must comment) (Cathy dtr [phone]) Choice offered to / list presented to : Adult Children  Discharge Placement              Patient chooses bed at: [facility] Patient to be transferred to facility by: [place] Name of family member notified: Tye [place] dtr [phone] Patient and family notified of of transfer: 12/15/21  Discharge Plan and Services   Discharge Planning Services: CM Consult Post Acute Care Choice: [place]                               Social Determinants of Health (SDOH) Interventions     Readmission Risk Interventions No flowsheet data found.

## 2021-12-15 NOTE — Progress Notes (Signed)
Attempted to call report second time.  Left voice message on DON number. Andre Lefort

## 2021-12-15 NOTE — Care Management Important Message (Signed)
Important Message  Patient Details IM Letter given to the Patient. Name: Nancy [name] MRN: [id number] Date of Birth: [birth year]   Medicare Important Message Given:  Yes     Kerin Salen 12/15/2021, 2:36 PM

## 2021-12-15 NOTE — TOC Progression Note (Signed)
Transition of Care [facility]) - Progression Note    Patient Details  Name: Nancy [name] MRN: [id number] Date of Birth: [birth year]  Transition of Care [facility]) CM/SW Contact  Ross Ludwig, [place] Phone Number: 12/15/2021, 10:18 AM  Clinical Narrative:     CSW received phone call from patient's insurance company, they were asking the name of the facility, CSW informed her that it is still pending, and a different TOC worker will be covering patient today.  CSW gave message to [facility] case Freight forwarder.  CSW received phone call from patient's daughter asking if patient will be discharging today.  CSW informed her that another Juliann Pulse is covering today.   CSW updated TOC worker, to follow up with daughter.     Expected Discharge Plan: [place] Barriers to Discharge: Continued Medical Work up  Expected Discharge Plan and Services Expected Discharge Plan: [place]   Discharge Planning Services: CM Consult Post Acute Care Choice: [place] Living arrangements for the past 2 months: Single Family Home                                       Social Determinants of Health (SDOH) Interventions    Readmission Risk Interventions No flowsheet data found.

## 2021-12-15 NOTE — Progress Notes (Signed)
Attempted to call report to [employer].  No answer at nurses desk when operator transferred. Andre Lefort

## 2021-12-19 DIAGNOSIS — I10 Essential (primary) hypertension: Secondary | ICD-10-CM | POA: Diagnosis not present

## 2021-12-19 DIAGNOSIS — E119 Type 2 diabetes mellitus without complications: Secondary | ICD-10-CM | POA: Diagnosis not present

## 2021-12-19 DIAGNOSIS — E559 Vitamin D deficiency, unspecified: Secondary | ICD-10-CM | POA: Diagnosis not present

## 2021-12-23 DIAGNOSIS — E785 Hyperlipidemia, unspecified: Secondary | ICD-10-CM | POA: Diagnosis not present

## 2021-12-23 DIAGNOSIS — S72002D Fracture of unspecified part of neck of left femur, subsequent encounter for closed fracture with routine healing: Secondary | ICD-10-CM | POA: Diagnosis not present

## 2021-12-23 DIAGNOSIS — G629 Polyneuropathy, unspecified: Secondary | ICD-10-CM | POA: Diagnosis not present

## 2021-12-23 DIAGNOSIS — M069 Rheumatoid arthritis, unspecified: Secondary | ICD-10-CM | POA: Diagnosis not present

## 2021-12-23 DIAGNOSIS — S72002A Fracture of unspecified part of neck of left femur, initial encounter for closed fracture: Secondary | ICD-10-CM | POA: Diagnosis not present

## 2021-12-23 DIAGNOSIS — M810 Age-related osteoporosis without current pathological fracture: Secondary | ICD-10-CM | POA: Diagnosis not present

## 2021-12-23 DIAGNOSIS — Z96642 Presence of left artificial hip joint: Secondary | ICD-10-CM | POA: Diagnosis not present

## 2021-12-23 NOTE — Telephone Encounter (Signed)
Called to schedule- vm box was full.

## 2021-12-24 DIAGNOSIS — E119 Type 2 diabetes mellitus without complications: Secondary | ICD-10-CM | POA: Diagnosis not present

## 2021-12-24 DIAGNOSIS — I10 Essential (primary) hypertension: Secondary | ICD-10-CM | POA: Diagnosis not present

## 2021-12-25 DIAGNOSIS — M810 Age-related osteoporosis without current pathological fracture: Secondary | ICD-10-CM | POA: Diagnosis not present

## 2021-12-25 DIAGNOSIS — M069 Rheumatoid arthritis, unspecified: Secondary | ICD-10-CM | POA: Diagnosis not present

## 2021-12-25 DIAGNOSIS — G629 Polyneuropathy, unspecified: Secondary | ICD-10-CM | POA: Diagnosis not present

## 2021-12-25 DIAGNOSIS — S72002D Fracture of unspecified part of neck of left femur, subsequent encounter for closed fracture with routine healing: Secondary | ICD-10-CM | POA: Diagnosis not present

## 2021-12-25 DIAGNOSIS — Z96642 Presence of left artificial hip joint: Secondary | ICD-10-CM | POA: Diagnosis not present

## 2021-12-29 DIAGNOSIS — D509 Iron deficiency anemia, unspecified: Secondary | ICD-10-CM | POA: Diagnosis not present

## 2021-12-29 DIAGNOSIS — Z96642 Presence of left artificial hip joint: Secondary | ICD-10-CM | POA: Diagnosis not present

## 2021-12-29 DIAGNOSIS — E559 Vitamin D deficiency, unspecified: Secondary | ICD-10-CM | POA: Diagnosis not present

## 2021-12-29 DIAGNOSIS — M6281 Muscle weakness (generalized): Secondary | ICD-10-CM | POA: Diagnosis not present

## 2021-12-29 DIAGNOSIS — S72002D Fracture of unspecified part of neck of left femur, subsequent encounter for closed fracture with routine healing: Secondary | ICD-10-CM | POA: Diagnosis not present

## 2021-12-29 DIAGNOSIS — M8000XA Age-related osteoporosis with current pathological fracture, unspecified site, initial encounter for fracture: Secondary | ICD-10-CM | POA: Diagnosis not present

## 2021-12-29 DIAGNOSIS — M069 Rheumatoid arthritis, unspecified: Secondary | ICD-10-CM | POA: Diagnosis not present

## 2021-12-30 NOTE — Telephone Encounter (Signed)
Noted. Rod Holler, can you ask the patient if she remembers what her side effect was to the fosamax in the past?

## 2021-12-30 NOTE — Telephone Encounter (Signed)
Called but no answer and vm was full

## 2021-12-30 NOTE — Telephone Encounter (Signed)
Pt says this is too expensive- declines.

## 2021-12-31 ENCOUNTER — Telehealth: Payer: Self-pay | Admitting: Family

## 2021-12-31 DIAGNOSIS — W19XXXD Unspecified fall, subsequent encounter: Secondary | ICD-10-CM | POA: Diagnosis not present

## 2021-12-31 DIAGNOSIS — F17210 Nicotine dependence, cigarettes, uncomplicated: Secondary | ICD-10-CM | POA: Diagnosis not present

## 2021-12-31 DIAGNOSIS — M069 Rheumatoid arthritis, unspecified: Secondary | ICD-10-CM | POA: Diagnosis not present

## 2021-12-31 DIAGNOSIS — Z7982 Long term (current) use of aspirin: Secondary | ICD-10-CM | POA: Diagnosis not present

## 2021-12-31 DIAGNOSIS — N189 Chronic kidney disease, unspecified: Secondary | ICD-10-CM | POA: Diagnosis not present

## 2021-12-31 DIAGNOSIS — M80052D Age-related osteoporosis with current pathological fracture, left femur, subsequent encounter for fracture with routine healing: Secondary | ICD-10-CM | POA: Diagnosis not present

## 2021-12-31 DIAGNOSIS — G629 Polyneuropathy, unspecified: Secondary | ICD-10-CM | POA: Diagnosis not present

## 2021-12-31 DIAGNOSIS — Z8673 Personal history of transient ischemic attack (TIA), and cerebral infarction without residual deficits: Secondary | ICD-10-CM | POA: Diagnosis not present

## 2021-12-31 DIAGNOSIS — E079 Disorder of thyroid, unspecified: Secondary | ICD-10-CM | POA: Diagnosis not present

## 2021-12-31 DIAGNOSIS — E785 Hyperlipidemia, unspecified: Secondary | ICD-10-CM | POA: Diagnosis not present

## 2021-12-31 DIAGNOSIS — Z9181 History of falling: Secondary | ICD-10-CM | POA: Diagnosis not present

## 2021-12-31 DIAGNOSIS — I739 Peripheral vascular disease, unspecified: Secondary | ICD-10-CM | POA: Diagnosis not present

## 2021-12-31 DIAGNOSIS — I129 Hypertensive chronic kidney disease with stage 1 through stage 4 chronic kidney disease, or unspecified chronic kidney disease: Secondary | ICD-10-CM | POA: Diagnosis not present

## 2021-12-31 DIAGNOSIS — Z7951 Long term (current) use of inhaled steroids: Secondary | ICD-10-CM | POA: Diagnosis not present

## 2021-12-31 DIAGNOSIS — D509 Iron deficiency anemia, unspecified: Secondary | ICD-10-CM | POA: Diagnosis not present

## 2021-12-31 NOTE — Telephone Encounter (Signed)
Patient does not remember taking fosamax before. She wanted to let provider know she felt in January and had to have her hip fix in February for a fracture.

## 2021-12-31 NOTE — Telephone Encounter (Signed)
Caller/Agency: Museum/gallery conservator from [place] Number: [phone] Requesting OT/PT/Skilled Nursing/Social Work/Speech Therapy: Nursing Frequency: 1 x 4  Medication interaction with the clonodine and albuterol, clonodine and carvedidol. She also needs clarification on the hydralazine medication.

## 2022-01-01 DIAGNOSIS — S72002A Fracture of unspecified part of neck of left femur, initial encounter for closed fracture: Secondary | ICD-10-CM | POA: Diagnosis not present

## 2022-01-01 MED ORDER — ALENDRONATE SODIUM 70 MG PO TABS: 70.0000 mg | ORAL_TABLET | ORAL | 11 refills | Status: AC

## 2022-01-01 MED ORDER — CALTRATE 600+D PLUS MINERALS 600-800 MG-UNIT PO CHEW: 1.0000 | CHEWABLE_TABLET | Freq: Two times a day (BID) | ORAL | Status: AC

## 2022-01-01 NOTE — Telephone Encounter (Signed)
Verbal orders given to Amber to start nursing care. Also advised of provider's comments on medication interactions

## 2022-01-01 NOTE — Telephone Encounter (Signed)
Let's have her try fosamax again. 1 tablet once weekly in the AM. Sit upright for at least 90 minutes after dose.  Let me know if she has any side effects.    Also, please add caltrate + D 600mg  twice daily.

## 2022-01-01 NOTE — Telephone Encounter (Signed)
Patient advised of new medication, she is hesitant to start it and will discuss with provider next week at her scheduled appointment

## 2022-01-01 NOTE — Telephone Encounter (Signed)
Reviewed drug interactions.  Benefits > risks in this situation. Patient should not abruptly discontinue the clonidine or carvedilol.  Otherwise we will continue to monitor her heart rate/blood pressure.   My records show hydralazine was discontinued in the hospital.

## 2022-01-01 NOTE — Addendum Note (Signed)
Addended by: Debbrah Alar on: 01/01/2022 08:31 AM   Modules accepted: Orders

## 2022-01-02 ENCOUNTER — Ambulatory Visit: Payer: Medicare Other

## 2022-01-02 DIAGNOSIS — F17210 Nicotine dependence, cigarettes, uncomplicated: Secondary | ICD-10-CM | POA: Diagnosis not present

## 2022-01-02 DIAGNOSIS — M80052D Age-related osteoporosis with current pathological fracture, left femur, subsequent encounter for fracture with routine healing: Secondary | ICD-10-CM | POA: Diagnosis not present

## 2022-01-02 DIAGNOSIS — S72032D Displaced midcervical fracture of left femur, subsequent encounter for closed fracture with routine healing: Secondary | ICD-10-CM | POA: Diagnosis not present

## 2022-01-02 DIAGNOSIS — I129 Hypertensive chronic kidney disease with stage 1 through stage 4 chronic kidney disease, or unspecified chronic kidney disease: Secondary | ICD-10-CM | POA: Diagnosis not present

## 2022-01-02 DIAGNOSIS — G629 Polyneuropathy, unspecified: Secondary | ICD-10-CM | POA: Diagnosis not present

## 2022-01-02 DIAGNOSIS — Z9181 History of falling: Secondary | ICD-10-CM | POA: Diagnosis not present

## 2022-01-02 DIAGNOSIS — D509 Iron deficiency anemia, unspecified: Secondary | ICD-10-CM | POA: Diagnosis not present

## 2022-01-02 DIAGNOSIS — N189 Chronic kidney disease, unspecified: Secondary | ICD-10-CM | POA: Diagnosis not present

## 2022-01-02 DIAGNOSIS — Z8673 Personal history of transient ischemic attack (TIA), and cerebral infarction without residual deficits: Secondary | ICD-10-CM | POA: Diagnosis not present

## 2022-01-02 DIAGNOSIS — E079 Disorder of thyroid, unspecified: Secondary | ICD-10-CM | POA: Diagnosis not present

## 2022-01-02 DIAGNOSIS — I739 Peripheral vascular disease, unspecified: Secondary | ICD-10-CM | POA: Diagnosis not present

## 2022-01-02 DIAGNOSIS — W19XXXD Unspecified fall, subsequent encounter: Secondary | ICD-10-CM | POA: Diagnosis not present

## 2022-01-02 DIAGNOSIS — Z7951 Long term (current) use of inhaled steroids: Secondary | ICD-10-CM | POA: Diagnosis not present

## 2022-01-02 DIAGNOSIS — M069 Rheumatoid arthritis, unspecified: Secondary | ICD-10-CM | POA: Diagnosis not present

## 2022-01-02 DIAGNOSIS — Z7982 Long term (current) use of aspirin: Secondary | ICD-10-CM | POA: Diagnosis not present

## 2022-01-02 DIAGNOSIS — E785 Hyperlipidemia, unspecified: Secondary | ICD-10-CM | POA: Diagnosis not present

## 2022-01-05 ENCOUNTER — Other Ambulatory Visit: Payer: Self-pay

## 2022-01-05 ENCOUNTER — Ambulatory Visit (HOSPITAL_BASED_OUTPATIENT_CLINIC_OR_DEPARTMENT_OTHER)
Admission: RE | Admit: 2022-01-05 | Discharge: 2022-01-05 | Disposition: A | Discharge status: Home / Self Care | Payer: Medicare Other | Source: Ambulatory Visit | Attending: Family | Admitting: Family

## 2022-01-05 ENCOUNTER — Ambulatory Visit (INDEPENDENT_AMBULATORY_CARE_PROVIDER_SITE_OTHER): Payer: Medicare Other | Admitting: Family

## 2022-01-05 VITALS — BP 98/44 | HR 70 | Temp 98.5°F | Resp 16

## 2022-01-05 DIAGNOSIS — M810 Age-related osteoporosis without current pathological fracture: Secondary | ICD-10-CM | POA: Diagnosis not present

## 2022-01-05 DIAGNOSIS — D649 Anemia, unspecified: Secondary | ICD-10-CM | POA: Diagnosis not present

## 2022-01-05 DIAGNOSIS — R739 Hyperglycemia, unspecified: Secondary | ICD-10-CM | POA: Diagnosis not present

## 2022-01-05 DIAGNOSIS — Z96642 Presence of left artificial hip joint: Secondary | ICD-10-CM | POA: Diagnosis not present

## 2022-01-05 DIAGNOSIS — I10 Essential (primary) hypertension: Secondary | ICD-10-CM | POA: Diagnosis not present

## 2022-01-05 DIAGNOSIS — E538 Deficiency of other specified B group vitamins: Secondary | ICD-10-CM | POA: Diagnosis not present

## 2022-01-05 DIAGNOSIS — E039 Hypothyroidism, unspecified: Secondary | ICD-10-CM

## 2022-01-05 DIAGNOSIS — R102 Pelvic and perineal pain unspecified side: Secondary | ICD-10-CM

## 2022-01-05 DIAGNOSIS — R0989 Other specified symptoms and signs involving the circulatory and respiratory systems: Secondary | ICD-10-CM | POA: Diagnosis not present

## 2022-01-05 DIAGNOSIS — R918 Other nonspecific abnormal finding of lung field: Secondary | ICD-10-CM | POA: Insufficient documentation

## 2022-01-05 DIAGNOSIS — D509 Iron deficiency anemia, unspecified: Secondary | ICD-10-CM

## 2022-01-05 LAB — COMPREHENSIVE METABOLIC PANEL
ALT: 8 U/L (ref 0–35)
AST: 15 U/L (ref 0–37)
Albumin: 3.5 g/dL (ref 3.5–5.2)
Alkaline Phosphatase: 74 U/L (ref 39–117)
BUN: 16 mg/dL (ref 6–23)
CO2: 27 mEq/L (ref 19–32)
Calcium: 9.3 mg/dL (ref 8.4–10.5)
Chloride: 104 mEq/L (ref 96–112)
Creatinine, Ser: 0.98 mg/dL (ref 0.40–1.20)
GFR: 54.4 mL/min — ABNORMAL LOW (ref >60.00)
Glucose, Bld: 138 mg/dL — ABNORMAL HIGH (ref 70–99)
Potassium: 4.1 mEq/L (ref 3.5–5.1)
Sodium: 139 mEq/L (ref 135–145)
Total Bilirubin: 0.5 mg/dL (ref 0.2–1.2)
Total Protein: 6.9 g/dL (ref 6.0–8.3)

## 2022-01-05 LAB — CBC WITH DIFFERENTIAL/PLATELET
Basophils Absolute: 0 10*3/uL (ref 0.0–0.1)
Basophils Relative: 0.8 % (ref 0.0–3.0)
Eosinophils Absolute: 0.4 10*3/uL (ref 0.0–0.7)
Eosinophils Relative: 6.3 % — ABNORMAL HIGH (ref 0.0–5.0)
HCT: 33.3 % — ABNORMAL LOW (ref 36.0–46.0)
Hemoglobin: 10.5 g/dL — ABNORMAL LOW (ref 12.0–15.0)
Lymphocytes Relative: 33.9 % (ref 12.0–46.0)
Lymphs Abs: 2.3 10*3/uL (ref 0.7–4.0)
MCHC: 31.5 g/dL (ref 30.0–36.0)
MCV: 87.9 fl (ref 78.0–100.0)
Monocytes Absolute: 0.7 10*3/uL (ref 0.1–1.0)
Monocytes Relative: 9.8 % (ref 3.0–12.0)
Neutro Abs: 3.3 10*3/uL (ref 1.4–7.7)
Neutrophils Relative %: 49.2 % (ref 43.0–77.0)
Platelets: 214 10*3/uL (ref 150.0–400.0)
RBC: 3.79 Mil/uL — ABNORMAL LOW (ref 3.87–5.11)
RDW: 15.4 % (ref 11.5–15.5)
WBC: 6.7 10*3/uL (ref 4.0–10.5)

## 2022-01-05 LAB — POC URINALSYSI DIPSTICK (AUTOMATED)
Blood, UA: NEGATIVE
Glucose, UA: NEGATIVE
Ketones, UA: NEGATIVE
Leukocytes, UA: NEGATIVE
Nitrite, UA: NEGATIVE
Protein, UA: POSITIVE — AB
Spec Grav, UA: 1.01 (ref 1.010–1.025)
Urobilinogen, UA: 0.2 E.U./dL
pH, UA: 6 (ref 5.0–8.0)

## 2022-01-05 MED ORDER — CYANOCOBALAMIN 1000 MCG/ML IJ SOLN
1000.0000 ug | Freq: Once | INTRAMUSCULAR | Status: AC
  Administered 2022-01-05: 1000 ug via INTRAMUSCULAR

## 2022-01-05 MED ORDER — LOSARTAN POTASSIUM 100 MG PO TABS: 50.0000 mg | ORAL_TABLET | Freq: Every day | ORAL | 1 refills | Status: DC

## 2022-01-05 NOTE — Assessment & Plan Note (Signed)
Lab Results  Component Value Date   WBC 9.7 12/14/2021   HGB 8.3 (L) 12/15/2021   HCT 25.3 (L) 12/15/2021   MCV 89.9 12/14/2021   PLT 201 12/14/2021

## 2022-01-05 NOTE — Progress Notes (Addendum)
Subjective:   By signing my name below, I, Zite Okoli, attest that this documentation has been prepared under the direction and in the presence of Debbrah Alar, NP 01/05/2022        [name], female    DOB: 22-Jan-1941, 81 y.o.   MRN: [id number]  Chief Complaint  Patient presents with   Follow-up    Follow up after falling 1-31. Hip surgery due to fracture 12-10-21    HPI Patient is in today for a hospital follow-up. She is accompanied by her two daughters.  Hip Arthroplasty- She had a fall on 12/09/2021 and had a total hip arthroplasty on 12/10/2021. She was in rehab and was released on 11/29/2021. She has complete help at home and is staying with her daughter now.   Spells-  Her daughter reports she had a spell this morning because her blood pressure was very low. Her daughter thinks it is because of the generalized weakness and bowel movements. She also reports she has been switching between constipation and diarrhea.  Appetite- She reports her appetite has not been that good but she has been eating a little and drinking boost.  UTI- She is requesting for a urine test because she is experiencing pelvic pressure and mild dysuria.   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   Cataract    Colon polyps    CVA (cerebral vascular accident) ([place]) 02/2017   Depression    Hyperlipidemia    Hypertension    Osteoporosis 01/01/2015   Rheumatoid arteritis (HCC)    Thyroid disease    TIA (transient ischemic attack) 2016   Urinary incontinence     Past Surgical History:  Procedure Laterality Date   ABDOMINAL HYSTERECTOMY  1976   CATARACT EXTRACTION Bilateral    LOWER EXTREMITY ANGIOGRAPHY N/A 08/29/2019   Procedure: LOWER EXTREMITY ANGIOGRAPHY;  Surgeon: Serafina Mitchell, MD;  Location: [place] [facility];  Service: Cardiovascular;  Laterality: N/A;   PERIPHERAL VASCULAR INTERVENTION Right  08/29/2019   Procedure: PERIPHERAL VASCULAR INTERVENTION;  Surgeon: Serafina Mitchell, MD;  Location: [place] [facility];  Service: Cardiovascular;  Laterality: Right;   TOTAL HIP ARTHROPLASTY Left 12/10/2021   Procedure: TOTAL HIP ARTHROPLASTY ANTERIOR APPROACH;  Surgeon: Rod Can, MD;  Location: [facility];  Service: Orthopedics;  Laterality: Left;  zimmer biomet   TUMOR REMOVAL  last was 1976   left ankle x3    Family History  Problem Relation Age of Onset   Hypertension Father    Colon cancer Father    Ulcerative colitis Mother    Parkinson's disease Mother    Heart disease Sister 56       CABG x 3   Cirrhosis Brother    Alcohol abuse Brother    Colitis Daughter    Colon polyps Daughter    Heart attack Daughter    Hypertension Son    Kidney disease Son        transplant due to kidney problems after [place] Storm   Arthritis Other    Coronary artery disease Other    Hyperlipidemia Other    Hypertension Other    Stroke Sister        died 27   Arthritis Sister    Hypertension Sister    Thyroid disease Daughter        x 3   Cirrhosis Brother  23       ETOH abuse    Social History   Socioeconomic History   Marital status: Divorced    Spouse name: Not on file   Number of children: 8   Years of education: Not on file   Highest education level: Not on file  Occupational History   Occupation: retired    Fish farm manager: RETIRED    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  Tobacco Use   Smoking status: Some Days    Packs/day: 0.50    Years: 32.00    Pack years: 16.00    Types: Cigarettes   Smokeless tobacco: Never   Tobacco comments:    6 or 7 per day  Vaping Use   Vaping Use: Never used  Substance and Sexual Activity   Alcohol use: No    Alcohol/week: 0.0 standard drinks   Drug use: No   Sexual activity: Never  Other Topics Concern   Not on file  Social History Narrative   Lives alone   Right Handed    Drinks 6-8 cups caffeine daily   Social Determinants of  Health   Financial Resource Strain: Low Risk    Difficulty of Paying Living Expenses: Not hard at all  Food Insecurity: No Food Insecurity   Worried About Charity fundraiser in the Last Year: Never true   Calcium in the Last Year: Never true  Transportation Needs: No Transportation Needs   Lack of Transportation (Medical): No   Lack of Transportation (Non-Medical): No  Physical Activity: Inactive   Days of Exercise per Week: 0 days   Minutes of Exercise per Session: 0 min  Stress: No Stress Concern Present   Feeling of Stress : Not at all  Social Connections: Moderately Isolated   Frequency of Communication with Friends and Family: More than three times a week   Frequency of Social Gatherings with Friends and Family: More than three times a week   Attends Religious Services: More than 4 times per year   Active Member of [employer] or Organizations: No   Attends Archivist Meetings: Never   Marital Status: Divorced  Human resources officer Violence: Not At Risk   Fear of Current or Ex-Partner: No   Emotionally Abused: No   Physically Abused: No   Sexually Abused: No    Outpatient Medications Prior to Visit  Medication Sig Dispense Refill   acetaminophen (TYLENOL) 500 MG tablet Take 500 mg by mouth every 6 (six) hours as needed for moderate pain.     albuterol (VENTOLIN HFA) 108 (90 Base) MCG/ACT inhaler Inhale 2 puffs into the lungs every 6 (six) hours as needed for wheezing or shortness of breath. 8 g 0   alendronate (FOSAMAX) 70 MG tablet Take 1 tablet (70 mg total) by mouth every 7 (seven) days. Take with a full glass of water on an empty stomach. 4 tablet 11   aspirin (ASPIRIN CHILDRENS) 81 MG chewable tablet Chew 1 tablet (81 mg total) by mouth 2 (two) times daily with a meal. 90 tablet 0   Calcium Carbonate-Vit D-Min (CALTRATE 600+D PLUS MINERALS) 600-800 MG-UNIT CHEW Chew 1 tablet by mouth in the morning and at bedtime. 60 tablet    carvedilol (COREG) 25 MG tablet  TAKE 1 TABLET BY MOUTH TWICE A DAY (Patient taking differently: Take 25 mg by mouth 2 (two) times daily with a meal.) 180 tablet 1   fluticasone (FLONASE) 50 MCG/ACT nasal spray Place 1 spray into both nostrils  daily as needed for allergies or rhinitis.     gabapentin (NEURONTIN) 100 MG capsule Take 1 capsule (100 mg total) by mouth 3 (three) times daily. 90 capsule 3   HYDROcodone-acetaminophen (NORCO/VICODIN) 5-325 MG tablet Take 1 tablet by mouth every 4 (four) hours as needed for moderate pain. 30 tablet 0   polyethylene glycol powder (GLYCOLAX/MIRALAX) 17 GM/SCOOP powder Take 17 g by mouth daily. (Patient taking differently: Take 17 g by mouth daily as needed for mild constipation.) 119 g 11   prednisoLONE acetate (PRED FORTE) 1 % ophthalmic suspension Place 1 drop into both eyes daily.      cloNIDine (CATAPRES) 0.1 MG tablet TAKE 1 TABLET BY MOUTH 3 TIMES DAILY. (Patient taking differently: Take 0.1 mg by mouth 3 (three) times daily.) 270 tablet 1   losartan (COZAAR) 100 MG tablet TAKE 1 TABLET BY MOUTH EVERY DAY (Patient taking differently: Take 100 mg by mouth daily.) 90 tablet 1   No facility-administered medications prior to visit.    Allergies  Allergen Reactions   Iron Nausea And Vomiting    Can take infusions     Review of Systems  Constitutional:  Negative for fever.  HENT:  Negative for ear pain and hearing loss.        (-)nystagmus (-)adenopathy  Eyes:  Negative for blurred vision.  Respiratory:  Negative for cough, shortness of breath and wheezing.   Cardiovascular:  Negative for chest pain and leg swelling.  Gastrointestinal:  Positive for constipation and diarrhea. Negative for blood in stool, nausea and vomiting.  Genitourinary:  Positive for dysuria. Negative for frequency.  Musculoskeletal:  Negative for joint pain and myalgias.  Skin:  Negative for rash.  Neurological:  Positive for weakness. Negative for headaches.  Psychiatric/Behavioral:  Negative for  depression. The patient is not nervous/anxious.       Objective:    Physical Exam Constitutional:      General: She is not in acute distress.    Appearance: Normal appearance. She is not ill-appearing.     Comments: Weak appearing, seated in wheelchair  HENT:     Head: Normocephalic and atraumatic.     Right Ear: External ear normal.     Left Ear: External ear normal.  Eyes:     Extraocular Movements: Extraocular movements intact.     Pupils: Pupils are equal, round, and reactive to light.  Cardiovascular:     Rate and Rhythm: Normal rate and regular rhythm.     Pulses: Normal pulses.     Heart sounds: Normal heart sounds. No murmur heard. Pulmonary:     Effort: Pulmonary effort is normal. No respiratory distress.     Breath sounds: Normal breath sounds. No wheezing or rhonchi.     Comments: Crackles at both bases Abdominal:     General: Bowel sounds are normal. There is no distension.     Palpations: Abdomen is soft.     Tenderness: There is no abdominal tenderness. There is no guarding or rebound.  Musculoskeletal:     Cervical back: Neck supple.  Lymphadenopathy:     Cervical: No cervical adenopathy.  Skin:    General: Skin is warm and dry.  Neurological:     Mental Status: She is alert and oriented to person, place, and time.  Psychiatric:        Behavior: Behavior normal.        Judgment: Judgment normal.    BP (!) 98/44 (BP Location: Left Arm, Patient Position: Sitting, Cuff  Size: Small)    Pulse 70    Temp 98.5 F (36.9 C) (Oral)    Resp 16    SpO2 99%  Wt Readings from Last 3 Encounters:  12/09/21 115 lb (52.2 kg)  11/14/21 117 lb (53.1 kg)  11/07/21 115 lb (52.2 kg)    Diabetic Foot Exam - Simple   No data filed    Lab Results  Component Value Date   WBC 9.7 12/14/2021   HGB 8.3 (L) 12/15/2021   HCT 25.3 (L) 12/15/2021   PLT 201 12/14/2021   GLUCOSE 96 12/15/2021   CHOL 202 (H) 10/10/2021   TRIG 87.0 10/10/2021   HDL 45.10 10/10/2021   LDLCALC  139 (H) 10/10/2021   ALT 9 10/10/2021   AST 12 10/10/2021   NA 137 12/15/2021   K 3.9 12/15/2021   CL 107 12/15/2021   CREATININE 0.63 12/15/2021   BUN 16 12/15/2021   CO2 24 12/15/2021   TSH 0.78 10/31/2020   INR 1.1 06/06/2019   HGBA1C 6.2 06/09/2021    Lab Results  Component Value Date   TSH 0.78 10/31/2020   Lab Results  Component Value Date   WBC 9.7 12/14/2021   HGB 8.3 (L) 12/15/2021   HCT 25.3 (L) 12/15/2021   MCV 89.9 12/14/2021   PLT 201 12/14/2021   Lab Results  Component Value Date   NA 137 12/15/2021   K 3.9 12/15/2021   CHLORIDE 108 09/24/2015   CO2 24 12/15/2021   GLUCOSE 96 12/15/2021   BUN 16 12/15/2021   CREATININE 0.63 12/15/2021   BILITOT 0.6 10/10/2021   ALKPHOS 44 10/10/2021   AST 12 10/10/2021   ALT 9 10/10/2021   PROT 6.7 10/10/2021   ALBUMIN 3.6 10/10/2021   CALCIUM 8.4 (L) 12/15/2021   ANIONGAP 6 12/15/2021   EGFR 89 (L) 09/24/2015   GFR 57.29 (L) 10/10/2021   Lab Results  Component Value Date   CHOL 202 (H) 10/10/2021   Lab Results  Component Value Date   HDL 45.10 10/10/2021   Lab Results  Component Value Date   LDLCALC 139 (H) 10/10/2021   Lab Results  Component Value Date   TRIG 87.0 10/10/2021   Lab Results  Component Value Date   CHOLHDL 4 10/10/2021   Lab Results  Component Value Date   HGBA1C 6.2 06/09/2021       Assessment & Plan:   Problem List Items Addressed This Visit       Unprioritized   S/P hip replacement, left    Continue home health PT. She is using a walker in the home.       Osteoporosis    She is willing to try fosamax.       Hypothyroidism    Not on synthroid. Obtain follow up TSH.       Relevant Orders   TSH   Hyperglycemia    Lab Results  Component Value Date   HGBA1C 6.2 06/09/2021        Relevant Orders   Hemoglobin A1c   RESOLVED: Essential hypertension    BP Readings from Last 3 Encounters:  01/05/22 (!) 98/44  12/15/21 (!) 149/64  11/14/21 (!) 124/58  BP  appears over treated. This is likely cause for her near syncopal episode this AM. Recommended that she remain off of the clonidine (daughter has not been giving it to her). Will cut losartan 115m in half and take 1/2 tab once daily.       Relevant  Medications   losartan (COZAAR) 100 MG tablet   Other Relevant Orders   Comp Met (CMET)   B12 deficiency - Primary    b12 injection given today. Continue monthly.       Anemia, iron deficiency    Lab Results  Component Value Date   WBC 9.7 12/14/2021   HGB 8.3 (L) 12/15/2021   HCT 25.3 (L) 12/15/2021   MCV 89.9 12/14/2021   PLT 201 12/14/2021        Other Visit Diagnoses     Anemia, unspecified type       Relevant Medications   cyanocobalamin ((VITAMIN B-12)) injection 1,000 mcg (Completed)   Other Relevant Orders   CBC with Differential/Platelet   Lung field abnormal finding on examination       Relevant Orders   DG Chest 2 View   Pelvic pressure in female       Relevant Orders   Urine Culture   POCT Urinalysis Dipstick (Automated) (Completed)        Meds ordered this encounter  Medications   cyanocobalamin ((VITAMIN B-12)) injection 1,000 mcg   losartan (COZAAR) 100 MG tablet    Sig: Take 0.5 tablets (50 mg total) by mouth daily.    Dispense:  90 tablet    Refill:  1    Order Specific Question:   Supervising Provider    Answer:   Penni Homans A [[id number]]    I,Zite Okoli,acting as a scribe for [name], NP.,have documented all relevant documentation on the behalf of [name], NP,as directed by  [name], NP while in the presence of [name], NP.   I, Debbrah Alar, NP , personally preformed the services described in this documentation.  All medical record entries made by the scribe were at my direction and in my presence.  I have reviewed the chart and discharge instructions (if applicable) and agree that the record reflects my personal performance and is accurate  and complete. 01/05/2022

## 2022-01-05 NOTE — Assessment & Plan Note (Addendum)
BP Readings from Last 3 Encounters:  01/05/22 (!) 98/44  12/15/21 (!) 149/64  11/14/21 (!) 124/58   BP appears over treated. This is likely cause for her near syncopal episode this AM. Recommended that she remain off of the clonidine (daughter has not been giving it to her). Will cut losartan 100mg  in half and take 1/2 tab once daily.

## 2022-01-05 NOTE — Assessment & Plan Note (Signed)
Not on synthroid. Obtain follow up TSH.

## 2022-01-05 NOTE — Assessment & Plan Note (Signed)
Lab Results  Component Value Date   HGBA1C 6.2 06/09/2021

## 2022-01-05 NOTE — Patient Instructions (Addendum)
Remain off of clonidine. Cut losartan in half  and take 1/2 tab once daily.  Complete lab work prior to leaving.

## 2022-01-05 NOTE — Assessment & Plan Note (Signed)
b12 injection given today. Continue monthly.

## 2022-01-06 DIAGNOSIS — Z96642 Presence of left artificial hip joint: Secondary | ICD-10-CM | POA: Insufficient documentation

## 2022-01-06 LAB — URINE CULTURE
MICRO NUMBER:: 13060994
SPECIMEN QUALITY:: ADEQUATE

## 2022-01-06 LAB — TSH: TSH: 1.43 u[IU]/mL (ref 0.35–5.50)

## 2022-01-06 NOTE — Assessment & Plan Note (Signed)
She is willing to try fosamax.

## 2022-01-06 NOTE — Assessment & Plan Note (Signed)
Continue home health PT. She is using a walker in the home.

## 2022-01-07 DIAGNOSIS — M80052D Age-related osteoporosis with current pathological fracture, left femur, subsequent encounter for fracture with routine healing: Secondary | ICD-10-CM | POA: Diagnosis not present

## 2022-01-07 DIAGNOSIS — Z7951 Long term (current) use of inhaled steroids: Secondary | ICD-10-CM | POA: Diagnosis not present

## 2022-01-07 DIAGNOSIS — W19XXXD Unspecified fall, subsequent encounter: Secondary | ICD-10-CM | POA: Diagnosis not present

## 2022-01-07 DIAGNOSIS — M069 Rheumatoid arthritis, unspecified: Secondary | ICD-10-CM | POA: Diagnosis not present

## 2022-01-07 DIAGNOSIS — Z8673 Personal history of transient ischemic attack (TIA), and cerebral infarction without residual deficits: Secondary | ICD-10-CM | POA: Diagnosis not present

## 2022-01-07 DIAGNOSIS — I129 Hypertensive chronic kidney disease with stage 1 through stage 4 chronic kidney disease, or unspecified chronic kidney disease: Secondary | ICD-10-CM | POA: Diagnosis not present

## 2022-01-07 DIAGNOSIS — Z7982 Long term (current) use of aspirin: Secondary | ICD-10-CM | POA: Diagnosis not present

## 2022-01-07 DIAGNOSIS — D509 Iron deficiency anemia, unspecified: Secondary | ICD-10-CM | POA: Diagnosis not present

## 2022-01-07 DIAGNOSIS — I739 Peripheral vascular disease, unspecified: Secondary | ICD-10-CM | POA: Diagnosis not present

## 2022-01-07 DIAGNOSIS — N189 Chronic kidney disease, unspecified: Secondary | ICD-10-CM | POA: Diagnosis not present

## 2022-01-07 DIAGNOSIS — E079 Disorder of thyroid, unspecified: Secondary | ICD-10-CM | POA: Diagnosis not present

## 2022-01-07 DIAGNOSIS — Z9181 History of falling: Secondary | ICD-10-CM | POA: Diagnosis not present

## 2022-01-07 DIAGNOSIS — G629 Polyneuropathy, unspecified: Secondary | ICD-10-CM | POA: Diagnosis not present

## 2022-01-07 DIAGNOSIS — E785 Hyperlipidemia, unspecified: Secondary | ICD-10-CM | POA: Diagnosis not present

## 2022-01-07 DIAGNOSIS — F17210 Nicotine dependence, cigarettes, uncomplicated: Secondary | ICD-10-CM | POA: Diagnosis not present

## 2022-01-08 DIAGNOSIS — U071 COVID-19: Secondary | ICD-10-CM | POA: Diagnosis not present

## 2022-01-08 DIAGNOSIS — R059 Cough, unspecified: Secondary | ICD-10-CM | POA: Diagnosis not present

## 2022-01-08 DIAGNOSIS — R5383 Other fatigue: Secondary | ICD-10-CM | POA: Diagnosis not present

## 2022-01-08 DIAGNOSIS — I959 Hypotension, unspecified: Secondary | ICD-10-CM | POA: Diagnosis not present

## 2022-01-08 DIAGNOSIS — N179 Acute kidney failure, unspecified: Secondary | ICD-10-CM | POA: Diagnosis not present

## 2022-01-08 DIAGNOSIS — R2689 Other abnormalities of gait and mobility: Secondary | ICD-10-CM | POA: Diagnosis not present

## 2022-01-08 DIAGNOSIS — R0602 Shortness of breath: Secondary | ICD-10-CM | POA: Diagnosis not present

## 2022-01-08 DIAGNOSIS — R6889 Other general symptoms and signs: Secondary | ICD-10-CM | POA: Diagnosis not present

## 2022-01-08 DIAGNOSIS — Z96642 Presence of left artificial hip joint: Secondary | ICD-10-CM | POA: Diagnosis not present

## 2022-01-08 DIAGNOSIS — Z87891 Personal history of nicotine dependence: Secondary | ICD-10-CM | POA: Diagnosis not present

## 2022-01-08 DIAGNOSIS — R531 Weakness: Secondary | ICD-10-CM | POA: Diagnosis not present

## 2022-01-08 DIAGNOSIS — I10 Essential (primary) hypertension: Secondary | ICD-10-CM | POA: Diagnosis not present

## 2022-01-08 DIAGNOSIS — D509 Iron deficiency anemia, unspecified: Secondary | ICD-10-CM | POA: Diagnosis not present

## 2022-01-08 DIAGNOSIS — E785 Hyperlipidemia, unspecified: Secondary | ICD-10-CM | POA: Diagnosis not present

## 2022-01-08 DIAGNOSIS — M069 Rheumatoid arthritis, unspecified: Secondary | ICD-10-CM | POA: Diagnosis not present

## 2022-01-08 DIAGNOSIS — E7800 Pure hypercholesterolemia, unspecified: Secondary | ICD-10-CM | POA: Diagnosis not present

## 2022-01-08 DIAGNOSIS — R350 Frequency of micturition: Secondary | ICD-10-CM | POA: Diagnosis not present

## 2022-01-08 DIAGNOSIS — Z743 Need for continuous supervision: Secondary | ICD-10-CM | POA: Diagnosis not present

## 2022-01-08 DIAGNOSIS — R55 Syncope and collapse: Secondary | ICD-10-CM | POA: Diagnosis not present

## 2022-01-09 DIAGNOSIS — M069 Rheumatoid arthritis, unspecified: Secondary | ICD-10-CM | POA: Diagnosis not present

## 2022-01-09 DIAGNOSIS — E785 Hyperlipidemia, unspecified: Secondary | ICD-10-CM | POA: Diagnosis not present

## 2022-01-09 DIAGNOSIS — D509 Iron deficiency anemia, unspecified: Secondary | ICD-10-CM | POA: Diagnosis not present

## 2022-01-09 DIAGNOSIS — R001 Bradycardia, unspecified: Secondary | ICD-10-CM | POA: Diagnosis not present

## 2022-01-09 DIAGNOSIS — N179 Acute kidney failure, unspecified: Secondary | ICD-10-CM | POA: Diagnosis not present

## 2022-01-09 DIAGNOSIS — I493 Ventricular premature depolarization: Secondary | ICD-10-CM | POA: Diagnosis not present

## 2022-01-09 DIAGNOSIS — Z96642 Presence of left artificial hip joint: Secondary | ICD-10-CM | POA: Diagnosis not present

## 2022-01-09 DIAGNOSIS — U071 COVID-19: Secondary | ICD-10-CM | POA: Diagnosis not present

## 2022-01-09 DIAGNOSIS — I10 Essential (primary) hypertension: Secondary | ICD-10-CM | POA: Diagnosis not present

## 2022-01-10 DIAGNOSIS — I10 Essential (primary) hypertension: Secondary | ICD-10-CM | POA: Diagnosis not present

## 2022-01-10 DIAGNOSIS — U071 COVID-19: Secondary | ICD-10-CM | POA: Diagnosis not present

## 2022-01-10 DIAGNOSIS — E785 Hyperlipidemia, unspecified: Secondary | ICD-10-CM | POA: Diagnosis not present

## 2022-01-10 DIAGNOSIS — Z96642 Presence of left artificial hip joint: Secondary | ICD-10-CM | POA: Diagnosis not present

## 2022-01-10 DIAGNOSIS — D509 Iron deficiency anemia, unspecified: Secondary | ICD-10-CM | POA: Diagnosis not present

## 2022-01-10 DIAGNOSIS — N179 Acute kidney failure, unspecified: Secondary | ICD-10-CM | POA: Diagnosis not present

## 2022-01-12 DIAGNOSIS — Z9181 History of falling: Secondary | ICD-10-CM | POA: Diagnosis not present

## 2022-01-12 DIAGNOSIS — D509 Iron deficiency anemia, unspecified: Secondary | ICD-10-CM | POA: Diagnosis not present

## 2022-01-12 DIAGNOSIS — M80052D Age-related osteoporosis with current pathological fracture, left femur, subsequent encounter for fracture with routine healing: Secondary | ICD-10-CM | POA: Diagnosis not present

## 2022-01-12 DIAGNOSIS — Z7982 Long term (current) use of aspirin: Secondary | ICD-10-CM | POA: Diagnosis not present

## 2022-01-12 DIAGNOSIS — N189 Chronic kidney disease, unspecified: Secondary | ICD-10-CM | POA: Diagnosis not present

## 2022-01-12 DIAGNOSIS — I129 Hypertensive chronic kidney disease with stage 1 through stage 4 chronic kidney disease, or unspecified chronic kidney disease: Secondary | ICD-10-CM | POA: Diagnosis not present

## 2022-01-12 DIAGNOSIS — Z7951 Long term (current) use of inhaled steroids: Secondary | ICD-10-CM | POA: Diagnosis not present

## 2022-01-12 DIAGNOSIS — M069 Rheumatoid arthritis, unspecified: Secondary | ICD-10-CM | POA: Diagnosis not present

## 2022-01-12 DIAGNOSIS — F17210 Nicotine dependence, cigarettes, uncomplicated: Secondary | ICD-10-CM | POA: Diagnosis not present

## 2022-01-12 DIAGNOSIS — E079 Disorder of thyroid, unspecified: Secondary | ICD-10-CM | POA: Diagnosis not present

## 2022-01-12 DIAGNOSIS — W19XXXD Unspecified fall, subsequent encounter: Secondary | ICD-10-CM | POA: Diagnosis not present

## 2022-01-12 DIAGNOSIS — Z8673 Personal history of transient ischemic attack (TIA), and cerebral infarction without residual deficits: Secondary | ICD-10-CM | POA: Diagnosis not present

## 2022-01-12 DIAGNOSIS — I739 Peripheral vascular disease, unspecified: Secondary | ICD-10-CM | POA: Diagnosis not present

## 2022-01-12 DIAGNOSIS — E785 Hyperlipidemia, unspecified: Secondary | ICD-10-CM | POA: Diagnosis not present

## 2022-01-12 DIAGNOSIS — G629 Polyneuropathy, unspecified: Secondary | ICD-10-CM | POA: Diagnosis not present

## 2022-01-13 ENCOUNTER — Telehealth (INDEPENDENT_AMBULATORY_CARE_PROVIDER_SITE_OTHER): Payer: Medicare Other | Admitting: Family

## 2022-01-13 VITALS — BP 144/78 | HR 77

## 2022-01-13 DIAGNOSIS — I10 Essential (primary) hypertension: Secondary | ICD-10-CM

## 2022-01-13 DIAGNOSIS — U071 COVID-19: Secondary | ICD-10-CM | POA: Insufficient documentation

## 2022-01-13 NOTE — Assessment & Plan Note (Signed)
BP stable. Continue losartan. Follow up in 1 month.  ?

## 2022-01-13 NOTE — Progress Notes (Signed)
MyChart Video Visit    Virtual Visit via Video Note   This visit type was conducted due to national recommendations for restrictions regarding the COVID-19 Pandemic (e.g. social distancing) in an effort to limit this patient's exposure and mitigate transmission in our community. This patient is at least at moderate risk for complications without adequate follow up. This format is felt to be most appropriate for this patient at this time. Physical exam was limited by quality of the video and audio technology used for the visit. CMA was able to get the patient set up on a video visit.  Patient location: Home Patient and provider in visit Provider location: Office  I discussed the limitations of evaluation and management by telemedicine and the availability of in person appointments. The patient expressed understanding and agreed to proceed.  Visit Date: 01/13/2022  Today's healthcare provider: [name], NP     Subjective:    [name], female    DOB: Jul 14, 1941, 81 y.o.   MRN: [id number]  Chief Complaint  Patient presents with   Hypertension    Follow up after medication dose decrease   Covid Positive    Was diagnosed 01-08-2022, feeling better    HPI Patient is in today for a virtual office visit.  COVID - 76 - Patient was previously admitted to the hospital due to [place] - 19 symptoms on 01/08/2022. She felt weak, had fatigue and a lost of appetite. She was discharged on 01/10/2022. She was treated with Decadron. Her energy has been improving.   Equipment - She has adequate medical equipment.  Diet - She reports regularly eating meals.  Blood Pressure - Blood pressures has been consistent. 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[facility])    Thyroid disease    TIA (transient ischemic attack) 2016   Urinary incontinence     Past Surgical History:  Procedure Laterality Date   ABDOMINAL HYSTERECTOMY  1976   CATARACT EXTRACTION Bilateral    LOWER EXTREMITY ANGIOGRAPHY N/A 08/29/2019   Procedure: LOWER EXTREMITY ANGIOGRAPHY;  Surgeon: Serafina Mitchell, MD;  Location: [facility];  Service: Cardiovascular;  Laterality: N/A;   PERIPHERAL VASCULAR INTERVENTION Right 08/29/2019   Procedure: PERIPHERAL VASCULAR INTERVENTION;  Surgeon: Serafina Mitchell, MD;  Location: [facility];  Service: Cardiovascular;  Laterality: Right;   TOTAL HIP ARTHROPLASTY Left 12/10/2021   Procedure: TOTAL HIP ARTHROPLASTY ANTERIOR APPROACH;  Surgeon: Rod Can, MD;  Location: [facility];  Service: Orthopedics;  Laterality: Left;  zimmer biomet   TUMOR REMOVAL  last was 1976   left ankle x3    Family History  Problem Relation Age of Onset   Hypertension Father    Colon cancer Father    Ulcerative colitis Mother    Parkinson's disease Mother    Heart disease Sister 29       CABG x 3   Cirrhosis Brother    Alcohol abuse Brother    Colitis Daughter    Colon polyps Daughter  Heart attack Daughter    Hypertension Son    Kidney disease Son        transplant due to kidney problems after [place]   Arthritis Other    Coronary artery disease Other    Hyperlipidemia Other    Hypertension Other    Stroke Sister        died 53   Arthritis Sister    Hypertension Sister    Thyroid disease Daughter        x 3   Cirrhosis Brother 42       ETOH abuse    Social History   Socioeconomic History   Marital status: Divorced    Spouse name: Not on file   Number of children: 8   Years of education: Not on file   Highest education level: Not on file  Occupational History    Occupation: retired    Fish farm manager: RETIRED    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  Tobacco Use   Smoking status: Some Days    Packs/day: 0.50    Years: 32.00    Pack years: 16.00    Types: Cigarettes   Smokeless tobacco: Never   Tobacco comments:    6 or 7 per day  Vaping Use   Vaping Use: Never used  Substance and Sexual Activity   Alcohol use: No    Alcohol/week: 0.0 standard drinks   Drug use: No   Sexual activity: Never  Other Topics Concern   Not on file  Social History Narrative   Lives alone   Right Handed    Drinks 6-8 cups caffeine daily   Social Determinants of Health   Financial Resource Strain: Low Risk    Difficulty of Paying Living Expenses: Not hard at all  Food Insecurity: No Food Insecurity   Worried About Charity fundraiser in the Last Year: Never true   [place] in the Last Year: Never true  Transportation Needs: No Transportation Needs   Lack of Transportation (Medical): No   Lack of Transportation (Non-Medical): No  Physical Activity: Inactive   Days of Exercise per Week: 0 days   Minutes of Exercise per Session: 0 min  Stress: No Stress Concern Present   Feeling of Stress : Not at all  Social Connections: Moderately Isolated   Frequency of Communication with Friends and Family: More than three times a week   Frequency of Social Gatherings with Friends and Family: More than three times a week   Attends Religious Services: More than 4 times per year   Active Member of [employer] or Organizations: No   Attends Archivist Meetings: Never   Marital Status: Divorced  Human resources officer Violence: Not At Risk   Fear of Current or Ex-Partner: No   Emotionally Abused: No   Physically Abused: No   Sexually Abused: No    Outpatient Medications Prior to Visit  Medication Sig Dispense Refill   acetaminophen (TYLENOL) 500 MG tablet Take 500 mg by mouth every 6 (six) hours as needed for moderate pain.     albuterol (VENTOLIN HFA) 108 (90  Base) MCG/ACT inhaler Inhale 2 puffs into the lungs every 6 (six) hours as needed for wheezing or shortness of breath. 8 g 0   alendronate (FOSAMAX) 70 MG tablet Take 1 tablet (70 mg total) by mouth every 7 (seven) days. Take with a full glass of water on an empty stomach. 4 tablet 11   aspirin (ASPIRIN CHILDRENS) 81 MG chewable  tablet Chew 1 tablet (81 mg total) by mouth 2 (two) times daily with a meal. 90 tablet 0   Calcium Carbonate-Vit D-Min (CALTRATE 600+D PLUS MINERALS) 600-800 MG-UNIT CHEW Chew 1 tablet by mouth in the morning and at bedtime. 60 tablet    carvedilol (COREG) 25 MG tablet TAKE 1 TABLET BY MOUTH TWICE A DAY (Patient taking differently: Take 25 mg by mouth 2 (two) times daily with a meal.) 180 tablet 1   fluticasone (FLONASE) 50 MCG/ACT nasal spray Place 1 spray into both nostrils daily as needed for allergies or rhinitis.     gabapentin (NEURONTIN) 100 MG capsule Take 1 capsule (100 mg total) by mouth 3 (three) times daily. 90 capsule 3   HYDROcodone-acetaminophen (NORCO/VICODIN) 5-325 MG tablet Take 1 tablet by mouth every 4 (four) hours as needed for moderate pain. 30 tablet 0   losartan (COZAAR) 100 MG tablet Take 0.5 tablets (50 mg total) by mouth daily. 90 tablet 1   polyethylene glycol powder (GLYCOLAX/MIRALAX) 17 GM/SCOOP powder Take 17 g by mouth daily. (Patient taking differently: Take 17 g by mouth daily as needed for mild constipation.) 119 g 11   prednisoLONE acetate (PRED FORTE) 1 % ophthalmic suspension Place 1 drop into both eyes daily.      No facility-administered medications prior to visit.    Allergies  Allergen Reactions   Iron Nausea And Vomiting    Can take infusions     ROS See HPI    Objective:    Physical Exam  BP (!) 144/78    Pulse 77  Wt Readings from Last 3 Encounters:  12/09/21 115 lb (52.2 kg)  11/14/21 117 lb (53.1 kg)  11/07/21 115 lb (52.2 kg)    Gen: Awake, alert, no acute distress Resp: Breathing is even and  non-labored Psych: calm/pleasant demeanor Neuro: Alert and Oriented x 3, + facial symmetry, speech is clear.  Diabetic Foot Exam - Simple   No data filed    Lab Results  Component Value Date   WBC 6.7 01/05/2022   HGB 10.5 (L) 01/05/2022   HCT 33.3 (L) 01/05/2022   PLT 214.0 01/05/2022   GLUCOSE 138 (H) 01/05/2022   CHOL 202 (H) 10/10/2021   TRIG 87.0 10/10/2021   HDL 45.10 10/10/2021   LDLCALC 139 (H) 10/10/2021   ALT 8 01/05/2022   AST 15 01/05/2022   NA 139 01/05/2022   K 4.1 01/05/2022   CL 104 01/05/2022   CREATININE 0.98 01/05/2022   BUN 16 01/05/2022   CO2 27 01/05/2022   TSH 1.43 01/05/2022   INR 1.1 06/06/2019   HGBA1C 6.2 06/09/2021    Lab Results  Component Value Date   TSH 1.43 01/05/2022   Lab Results  Component Value Date   WBC 6.7 01/05/2022   HGB 10.5 (L) 01/05/2022   HCT 33.3 (L) 01/05/2022   MCV 87.9 01/05/2022   PLT 214.0 01/05/2022   Lab Results  Component Value Date   NA 139 01/05/2022   K 4.1 01/05/2022   CHLORIDE 108 09/24/2015   CO2 27 01/05/2022   GLUCOSE 138 (H) 01/05/2022   BUN 16 01/05/2022   CREATININE 0.98 01/05/2022   BILITOT 0.5 01/05/2022   ALKPHOS 74 01/05/2022   AST 15 01/05/2022   ALT 8 01/05/2022   PROT 6.9 01/05/2022   ALBUMIN 3.5 01/05/2022   CALCIUM 9.3 01/05/2022   ANIONGAP 6 12/15/2021   EGFR 89 (L) 09/24/2015   GFR 54.40 (L) 01/05/2022   Lab Results  Component Value Date   CHOL 202 (H) 10/10/2021   Lab Results  Component Value Date   HDL 45.10 10/10/2021   Lab Results  Component Value Date   LDLCALC 139 (H) 10/10/2021   Lab Results  Component Value Date   TRIG 87.0 10/10/2021   Lab Results  Component Value Date   CHOLHDL 4 10/10/2021   Lab Results  Component Value Date   HGBA1C 6.2 06/09/2021       Assessment & Plan:   Problem List Items Addressed This Visit       Unprioritized   Essential hypertension    BP stable. Continue losartan. Follow up in 1 month.       COVID-19  virus infection - Primary    Clinically improving. It looks like the hospital discharged her on oral steroids, but it does not appear that she was treated with antiviral therapy such as molnupiravir. It is unclear to me why.  Pt is advised to let us know if her symptoms worsen or if she does not continue to improve.         No orders of the defined types were placed in this encounter.   I discussed the assessment and treatment plan with the patient. The patient was provided an opportunity to ask questions and all were answered. The patient agreed with the plan and demonstrated an understanding of the instructions.   The patient was advised to call back or seek an in-person evaluation if the symptoms worsen or if the condition fails to improve as anticipated.  I provided 20 minutes of face-to-face time during this encounter.   I,Amber Collins,acting as a Education administrator for [employer], NP.,have documented all relevant documentation on the behalf of [name], NP,as directed by  [name], NP while in the presence of [name], NP.   [name], NP [employer] [facility] [employer] [phone] (phone) [phone] (fax)  [place]

## 2022-01-13 NOTE — Assessment & Plan Note (Signed)
Clinically improving. It looks like the hospital discharged her on oral steroids, but it does not appear that she was treated with antiviral therapy such as molnupiravir. It is unclear to me why.  Pt is advised to let us know if her symptoms worsen or if she does not continue to improve.  ?

## 2022-01-15 DIAGNOSIS — G629 Polyneuropathy, unspecified: Secondary | ICD-10-CM | POA: Diagnosis not present

## 2022-01-15 DIAGNOSIS — Z8673 Personal history of transient ischemic attack (TIA), and cerebral infarction without residual deficits: Secondary | ICD-10-CM | POA: Diagnosis not present

## 2022-01-15 DIAGNOSIS — F17210 Nicotine dependence, cigarettes, uncomplicated: Secondary | ICD-10-CM | POA: Diagnosis not present

## 2022-01-15 DIAGNOSIS — E079 Disorder of thyroid, unspecified: Secondary | ICD-10-CM | POA: Diagnosis not present

## 2022-01-15 DIAGNOSIS — D509 Iron deficiency anemia, unspecified: Secondary | ICD-10-CM | POA: Diagnosis not present

## 2022-01-15 DIAGNOSIS — Z7982 Long term (current) use of aspirin: Secondary | ICD-10-CM | POA: Diagnosis not present

## 2022-01-15 DIAGNOSIS — Z7951 Long term (current) use of inhaled steroids: Secondary | ICD-10-CM | POA: Diagnosis not present

## 2022-01-15 DIAGNOSIS — N189 Chronic kidney disease, unspecified: Secondary | ICD-10-CM | POA: Diagnosis not present

## 2022-01-15 DIAGNOSIS — I129 Hypertensive chronic kidney disease with stage 1 through stage 4 chronic kidney disease, or unspecified chronic kidney disease: Secondary | ICD-10-CM | POA: Diagnosis not present

## 2022-01-15 DIAGNOSIS — M80052D Age-related osteoporosis with current pathological fracture, left femur, subsequent encounter for fracture with routine healing: Secondary | ICD-10-CM | POA: Diagnosis not present

## 2022-01-15 DIAGNOSIS — I739 Peripheral vascular disease, unspecified: Secondary | ICD-10-CM | POA: Diagnosis not present

## 2022-01-15 DIAGNOSIS — Z9181 History of falling: Secondary | ICD-10-CM | POA: Diagnosis not present

## 2022-01-15 DIAGNOSIS — W19XXXD Unspecified fall, subsequent encounter: Secondary | ICD-10-CM | POA: Diagnosis not present

## 2022-01-15 DIAGNOSIS — M069 Rheumatoid arthritis, unspecified: Secondary | ICD-10-CM | POA: Diagnosis not present

## 2022-01-15 DIAGNOSIS — E785 Hyperlipidemia, unspecified: Secondary | ICD-10-CM | POA: Diagnosis not present

## 2022-01-19 DIAGNOSIS — D509 Iron deficiency anemia, unspecified: Secondary | ICD-10-CM | POA: Diagnosis not present

## 2022-01-19 DIAGNOSIS — Z7951 Long term (current) use of inhaled steroids: Secondary | ICD-10-CM | POA: Diagnosis not present

## 2022-01-19 DIAGNOSIS — Z7982 Long term (current) use of aspirin: Secondary | ICD-10-CM | POA: Diagnosis not present

## 2022-01-19 DIAGNOSIS — E785 Hyperlipidemia, unspecified: Secondary | ICD-10-CM | POA: Diagnosis not present

## 2022-01-19 DIAGNOSIS — F17210 Nicotine dependence, cigarettes, uncomplicated: Secondary | ICD-10-CM | POA: Diagnosis not present

## 2022-01-19 DIAGNOSIS — Z8673 Personal history of transient ischemic attack (TIA), and cerebral infarction without residual deficits: Secondary | ICD-10-CM | POA: Diagnosis not present

## 2022-01-19 DIAGNOSIS — G629 Polyneuropathy, unspecified: Secondary | ICD-10-CM | POA: Diagnosis not present

## 2022-01-19 DIAGNOSIS — I739 Peripheral vascular disease, unspecified: Secondary | ICD-10-CM | POA: Diagnosis not present

## 2022-01-19 DIAGNOSIS — W19XXXD Unspecified fall, subsequent encounter: Secondary | ICD-10-CM | POA: Diagnosis not present

## 2022-01-19 DIAGNOSIS — I129 Hypertensive chronic kidney disease with stage 1 through stage 4 chronic kidney disease, or unspecified chronic kidney disease: Secondary | ICD-10-CM | POA: Diagnosis not present

## 2022-01-19 DIAGNOSIS — M069 Rheumatoid arthritis, unspecified: Secondary | ICD-10-CM | POA: Diagnosis not present

## 2022-01-19 DIAGNOSIS — Z9181 History of falling: Secondary | ICD-10-CM | POA: Diagnosis not present

## 2022-01-19 DIAGNOSIS — N189 Chronic kidney disease, unspecified: Secondary | ICD-10-CM | POA: Diagnosis not present

## 2022-01-19 DIAGNOSIS — E079 Disorder of thyroid, unspecified: Secondary | ICD-10-CM | POA: Diagnosis not present

## 2022-01-19 DIAGNOSIS — M80052D Age-related osteoporosis with current pathological fracture, left femur, subsequent encounter for fracture with routine healing: Secondary | ICD-10-CM | POA: Diagnosis not present

## 2022-01-24 NOTE — Telephone Encounter (Signed)
Per visit note 01/05/22: ? ?Osteoporosis   ?    She is willing to try fosamax.   ?  ? ?

## 2022-01-24 NOTE — Telephone Encounter (Signed)
Pt archived in [email or web address].  Please advise if patient and/or provider wish to proceed with Prolia therpay.  

## 2022-01-26 DIAGNOSIS — Z96642 Presence of left artificial hip joint: Secondary | ICD-10-CM | POA: Diagnosis not present

## 2022-01-26 DIAGNOSIS — M069 Rheumatoid arthritis, unspecified: Secondary | ICD-10-CM | POA: Diagnosis not present

## 2022-01-26 DIAGNOSIS — S72002D Fracture of unspecified part of neck of left femur, subsequent encounter for closed fracture with routine healing: Secondary | ICD-10-CM | POA: Diagnosis not present

## 2022-01-30 DIAGNOSIS — S72032D Displaced midcervical fracture of left femur, subsequent encounter for closed fracture with routine healing: Secondary | ICD-10-CM | POA: Diagnosis not present

## 2022-02-13 ENCOUNTER — Other Ambulatory Visit: Payer: Self-pay | Admitting: Family

## 2022-02-16 ENCOUNTER — Encounter: Payer: Self-pay | Admitting: Family

## 2022-02-16 DIAGNOSIS — I739 Peripheral vascular disease, unspecified: Secondary | ICD-10-CM

## 2022-02-24 ENCOUNTER — Ambulatory Visit (INDEPENDENT_AMBULATORY_CARE_PROVIDER_SITE_OTHER): Payer: Medicare Other | Admitting: Family

## 2022-02-24 ENCOUNTER — Ambulatory Visit: Payer: Medicare Other | Admitting: Family

## 2022-02-24 VITALS — BP 144/59 | HR 74 | Temp 98.3°F | Resp 16 | Wt 117.0 lb

## 2022-02-24 DIAGNOSIS — E538 Deficiency of other specified B group vitamins: Secondary | ICD-10-CM | POA: Diagnosis not present

## 2022-02-24 DIAGNOSIS — R739 Hyperglycemia, unspecified: Secondary | ICD-10-CM

## 2022-02-24 DIAGNOSIS — I10 Essential (primary) hypertension: Secondary | ICD-10-CM | POA: Diagnosis not present

## 2022-02-24 DIAGNOSIS — I739 Peripheral vascular disease, unspecified: Secondary | ICD-10-CM | POA: Diagnosis not present

## 2022-02-24 DIAGNOSIS — G629 Polyneuropathy, unspecified: Secondary | ICD-10-CM | POA: Diagnosis not present

## 2022-02-24 MED ORDER — LOSARTAN POTASSIUM 100 MG PO TABS
100.0000 mg | ORAL_TABLET | Freq: Every day | ORAL | 1 refills | Status: AC
Start: 2022-02-24 — End: ?

## 2022-02-24 MED ORDER — GABAPENTIN 100 MG PO CAPS: 200.0000 mg | ORAL_CAPSULE | Freq: Three times a day (TID) | ORAL | 3 refills | Status: AC

## 2022-02-24 MED ORDER — CYANOCOBALAMIN 1000 MCG/ML IJ SOLN
1000.0000 ug | Freq: Once | INTRAMUSCULAR | Status: AC
  Administered 2022-02-24: 1000 ug via INTRAMUSCULAR

## 2022-02-24 MED ORDER — CARVEDILOL 25 MG PO TABS
25.0000 mg | ORAL_TABLET | Freq: Two times a day (BID) | ORAL | 1 refills | Status: AC
Start: 2022-02-24 — End: ?

## 2022-02-24 NOTE — Patient Instructions (Addendum)
Please call Dr. Stephens Shire office to schedule a follow up appointment. [phone].  ?Complete lab work prior to leaving.  ?

## 2022-02-24 NOTE — Assessment & Plan Note (Signed)
Uncontrolled. Will increase gabapentin from '100mg'$  tid to '200mg'$  tid. ?

## 2022-02-24 NOTE — Progress Notes (Signed)
? ?Subjective:  ? ?By signing my name below, I, Carylon Perches, attest that this documentation has been prepared under the direction and in the presence of Debbrah Alar NP, 02/24/2022   ? ? Patient ID: BRE PECINA, female    DOB: May 20, 1941, 81 y.o.   MRN: [id number] ? ?Chief Complaint  ?Patient presents with  ? Hypertension  ?  Here for follow up  ? Follow-up  ?  Follow up after covid, '' still feeling very tired"  ? B12 deficiency  ?  Here for B12 shot  ? ? ?HPI ?Patient is in today for an office visit. ? ?Refill - She is requesting a refill of 25 MG of Carvedilol.  ? ?Right Upper Leg Pain - She complains of persistent right upper leg pain that she believes is due to a blood clot.  ? ?Burning/ Pain - She complains of a persistent burning and pain sensation in her right arch. She is requesting an increased dosage of 100 MG of Gabapentin.  ? ?Fatigue - She complain of fatigue after being diagnosed with COVID - 19 in 01/08/2022 ? ?Blood Pressure - As of today's visit, her blood pressure is good. She is taking 100 MG of Losartan.  ?BP Readings from Last 3 Encounters:  ?02/24/22 (!) 144/59  ?01/13/22 (!) 144/78  ?01/05/22 (!) 98/44  ? ?Shingrix vaccine - She has not received her Shingrix vaccine. She is waiting to check with her rheumatologist first.  ? ?Health Maintenance Due  ?Topic Date Due  ? Zoster Vaccines- Shingrix (1 of 2) Never done  ? ? ?Past Medical History:  ?Diagnosis Date  ? Allergy   ? Anemia   ? iron deficiency  ? Cancer [facility]) 1995  ? vaginal  ? Cataract   ? Colon polyps   ? CVA (cerebral vascular accident) ([place]) 02/2017  ? Depression   ? Hyperlipidemia   ? Hypertension   ? Osteoporosis 01/01/2015  ? Rheumatoid arteritis ([place])   ? Thyroid disease   ? TIA (transient ischemic attack) 2016  ? Urinary incontinence   ? ? ?Past Surgical History:  ?Procedure Laterality Date  ? ABDOMINAL HYSTERECTOMY  1976  ? CATARACT EXTRACTION Bilateral   ? LOWER EXTREMITY ANGIOGRAPHY N/A 08/29/2019  ? Procedure: LOWER  EXTREMITY ANGIOGRAPHY;  Surgeon: Serafina Mitchell, MD;  Location: [facility];  Service: Cardiovascular;  Laterality: N/A;  ? PERIPHERAL VASCULAR INTERVENTION Right 08/29/2019  ? Procedure: PERIPHERAL VASCULAR INTERVENTION;  Surgeon: Serafina Mitchell, MD;  Location: [facility];  Service: Cardiovascular;  Laterality: Right;  ? TOTAL HIP ARTHROPLASTY Left 12/10/2021  ? Procedure: TOTAL HIP ARTHROPLASTY ANTERIOR APPROACH;  Surgeon: Rod Can, MD;  Location: [facility];  Service: Orthopedics;  Laterality: Left;  zimmer biomet  ? TUMOR REMOVAL  last was 1976  ? left ankle x3  ? ? ?Family History  ?Problem Relation Age of Onset  ? Hypertension Father   ? Colon cancer Father   ? Ulcerative colitis Mother   ? Parkinson's disease Mother   ? Heart disease Sister 55  ?     CABG x 3  ? Cirrhosis Brother   ? Alcohol abuse Brother   ? Colitis Daughter   ? Colon polyps Daughter   ? Heart attack Daughter   ? Hypertension Son   ? Kidney disease Son   ?     transplant due to kidney problems after [facility]  ? Arthritis Other   ? Coronary artery disease Other   ?  Hyperlipidemia Other   ? Hypertension Other   ? Stroke Sister   ?     died 26  ? Arthritis Sister   ? Hypertension Sister   ? Thyroid disease Daughter   ?     x 3  ? Cirrhosis Brother 38  ?     ETOH abuse  ? ? ?Social History  ? ?Socioeconomic History  ? Marital status: Divorced  ?  Spouse name: Not on file  ? Number of children: 8  ? Years of education: Not on file  ? Highest education level: Not on file  ?Occupational History  ? Occupation: retired  ?  Employer: RETIRED  ?  Comment: Retired from [employer]  ?Tobacco Use  ? Smoking status: Some Days  ?  Packs/day: 0.50  ?  Years: 32.00  ?  Pack years: 16.00  ?  Types: Cigarettes  ? Smokeless tobacco: Never  ? Tobacco comments:  ?  6 or 7 per day  ?Vaping Use  ? Vaping Use: Never used  ?Substance and Sexual Activity  ? Alcohol use: No  ?  Alcohol/week: 0.0 standard drinks  ? Drug use: No  ? Sexual  activity: Never  ?Other Topics Concern  ? Not on file  ?Social History Narrative  ? Lives alone  ? Right Handed   ? Drinks 6-8 cups caffeine daily  ? ?Social Determinants of Health  ? ?Financial Resource Strain: Low Risk   ? Difficulty of Paying Living Expenses: Not hard at all  ?Food Insecurity: No Food Insecurity  ? Worried About Charity fundraiser in the Last Year: Never true  ? Ran Out of Food in the Last Year: Never true  ?Transportation Needs: No Transportation Needs  ? Lack of Transportation (Medical): No  ? Lack of Transportation (Non-Medical): No  ?Physical Activity: Inactive  ? Days of Exercise per Week: 0 days  ? Minutes of Exercise per Session: 0 min  ?Stress: No Stress Concern Present  ? Feeling of Stress : Not at all  ?Social Connections: Moderately Isolated  ? Frequency of Communication with Friends and Family: More than three times a week  ? Frequency of Social Gatherings with Friends and Family: More than three times a week  ? Attends Religious Services: More than 4 times per year  ? Active Member of Clubs or Organizations: No  ? Attends Archivist Meetings: Never  ? Marital Status: Divorced  ?Intimate Partner Violence: Not At Risk  ? Fear of Current or Ex-Partner: No  ? Emotionally Abused: No  ? Physically Abused: No  ? Sexually Abused: No  ? ? ?Outpatient Medications Prior to Visit  ?Medication Sig Dispense Refill  ? acetaminophen (TYLENOL) 500 MG tablet Take 500 mg by mouth every 6 (six) hours as needed for moderate pain.    ? albuterol (VENTOLIN HFA) 108 (90 Base) MCG/ACT inhaler Inhale 2 puffs into the lungs every 6 (six) hours as needed for wheezing or shortness of breath. 8 g 0  ? alendronate (FOSAMAX) 70 MG tablet Take 1 tablet (70 mg total) by mouth every 7 (seven) days. Take with a full glass of water on an empty stomach. 4 tablet 11  ? Calcium Carbonate-Vit D-Min (CALTRATE 600+D PLUS MINERALS) 600-800 MG-UNIT CHEW Chew 1 tablet by mouth in the morning and at bedtime. 60 tablet    ? fluticasone (FLONASE) 50 MCG/ACT nasal spray Place 1 spray into both nostrils daily as needed for allergies or rhinitis.    ?  HYDROcodone-acetaminophen (NORCO/VICODIN) 5-325 MG tablet Take 1 tablet by mouth every 4 (four) hours as needed for moderate pain. 30 tablet 0  ? polyethylene glycol powder (GLYCOLAX/MIRALAX) 17 GM/SCOOP powder Take 17 g by mouth daily. (Patient taking differently: Take 17 g by mouth daily as needed for mild constipation.) 119 g 11  ? prednisoLONE acetate (PRED FORTE) 1 % ophthalmic suspension Place 1 drop into both eyes daily.     ? carvedilol (COREG) 25 MG tablet TAKE 1 TABLET BY MOUTH TWICE A DAY (Patient taking differently: Take 25 mg by mouth 2 (two) times daily with a meal.) 180 tablet 1  ? gabapentin (NEURONTIN) 100 MG capsule Take 1 capsule (100 mg total) by mouth 3 (three) times daily. 90 capsule 3  ? losartan (COZAAR) 100 MG tablet TAKE 1 TABLET BY MOUTH EVERY DAY 90 tablet 1  ? ?No facility-administered medications prior to visit.  ? ? ?Allergies  ?Allergen Reactions  ? Iron Nausea And Vomiting  ?  Can take infusions ?  ? ? ?Review of Systems  ?Constitutional:  Positive for malaise/fatigue.  ?Musculoskeletal:  Positive for joint pain (Right Arch) and myalgias (Right Upper Leg).  ?     (+) Burning in Right Arch  ? ?   ?Objective:  ?  ?Physical Exam ?Constitutional:   ?   General: She is not in acute distress. ?   Appearance: Normal appearance. She is not ill-appearing.  ?HENT:  ?   Head: Normocephalic and atraumatic.  ?   Right Ear: External ear normal.  ?   Left Ear: External ear normal.  ?Eyes:  ?   Extraocular Movements: Extraocular movements intact.  ?   Pupils: Pupils are equal, round, and reactive to light.  ?Cardiovascular:  ?   Rate and Rhythm: Normal rate and regular rhythm.  ?   Pulses:     ?     Dorsalis pedis pulses are 1+ on the right side and 1+ on the left side.  ?     Posterior tibial pulses are 1+ on the right side.  ?   Heart sounds: Normal heart sounds. No  murmur heard. ?  No gallop.  ?   Comments: Trace pulse Posterior Tibialis (Left Foot) ? ?Pulmonary:  ?   Effort: Pulmonary effort is normal. No respiratory distress.  ?   Breath sounds: Normal breath sounds.

## 2022-02-24 NOTE — Assessment & Plan Note (Signed)
She has not heard back from the vascular team re: follow up appointment. I gave her the number to call for Dr. Trula Slade. ?

## 2022-02-24 NOTE — Assessment & Plan Note (Signed)
Will give shot today.  ?

## 2022-02-24 NOTE — Assessment & Plan Note (Addendum)
BP is acceptable. Continue losartan.carvedilol.  ?

## 2022-02-25 LAB — HEMOGLOBIN A1C: Hgb A1c MFr Bld: 6.3 % (ref 4.6–6.5)

## 2022-02-25 LAB — BASIC METABOLIC PANEL
BUN: 18 mg/dL (ref 6–23)
CO2: 26 mEq/L (ref 19–32)
Calcium: 10.1 mg/dL (ref 8.4–10.5)
Chloride: 106 mEq/L (ref 96–112)
Creatinine, Ser: 0.88 mg/dL (ref 0.40–1.20)
GFR: 61.84 mL/min (ref >60.00)
Glucose, Bld: 87 mg/dL (ref 70–99)
Potassium: 4.8 mEq/L (ref 3.5–5.1)
Sodium: 140 mEq/L (ref 135–145)

## 2022-02-26 DIAGNOSIS — M069 Rheumatoid arthritis, unspecified: Secondary | ICD-10-CM | POA: Diagnosis not present

## 2022-02-26 DIAGNOSIS — Z96642 Presence of left artificial hip joint: Secondary | ICD-10-CM | POA: Diagnosis not present

## 2022-02-26 DIAGNOSIS — S72002D Fracture of unspecified part of neck of left femur, subsequent encounter for closed fracture with routine healing: Secondary | ICD-10-CM | POA: Diagnosis not present

## 2022-03-03 DIAGNOSIS — Z6820 Body mass index (BMI) 20.0-20.9, adult: Secondary | ICD-10-CM | POA: Diagnosis not present

## 2022-03-03 DIAGNOSIS — M810 Age-related osteoporosis without current pathological fracture: Secondary | ICD-10-CM | POA: Diagnosis not present

## 2022-03-03 DIAGNOSIS — M1991 Primary osteoarthritis, unspecified site: Secondary | ICD-10-CM | POA: Diagnosis not present

## 2022-03-03 DIAGNOSIS — H209 Unspecified iridocyclitis: Secondary | ICD-10-CM | POA: Diagnosis not present

## 2022-03-03 DIAGNOSIS — Z79899 Other long term (current) drug therapy: Secondary | ICD-10-CM | POA: Diagnosis not present

## 2022-03-03 DIAGNOSIS — M0579 Rheumatoid arthritis with rheumatoid factor of multiple sites without organ or systems involvement: Secondary | ICD-10-CM | POA: Diagnosis not present

## 2022-03-03 DIAGNOSIS — H15042 Scleritis with corneal involvement, left eye: Secondary | ICD-10-CM | POA: Diagnosis not present

## 2022-03-04 ENCOUNTER — Ambulatory Visit (HOSPITAL_COMMUNITY)
Admission: RE | Admit: 2022-03-04 | Discharge: 2022-03-04 | Disposition: A | Discharge status: Home / Self Care | Payer: Medicare Other | Source: Ambulatory Visit | Attending: Family | Admitting: Family

## 2022-03-04 DIAGNOSIS — I739 Peripheral vascular disease, unspecified: Secondary | ICD-10-CM | POA: Diagnosis not present

## 2022-03-06 ENCOUNTER — Telehealth: Payer: Self-pay | Admitting: Family

## 2022-03-06 ENCOUNTER — Encounter: Payer: Self-pay | Admitting: Family

## 2022-03-06 DIAGNOSIS — I739 Peripheral vascular disease, unspecified: Secondary | ICD-10-CM

## 2022-03-06 DIAGNOSIS — M25561 Pain in right knee: Secondary | ICD-10-CM

## 2022-03-06 NOTE — Telephone Encounter (Signed)
Patient advised of result and provider's comments about going to ed if increased pain, discoloration or coolness. She verbalized understanding. ?

## 2022-03-06 NOTE — Telephone Encounter (Signed)
Arterial study shows very poor blood flow to the right leg.  I have placed a referral to her vascular dr. (Dr. Trula Slade).  In the meantime if increased pain, discoloration, coolness of right leg/foot occur please go to the ER.  ?

## 2022-03-06 NOTE — Telephone Encounter (Signed)
Opened in error

## 2022-03-09 ENCOUNTER — Telehealth: Payer: Self-pay | Admitting: Family

## 2022-03-09 NOTE — Telephone Encounter (Signed)
-----   Message from Serafina Mitchell, MD sent at 03/08/2022  7:27 PM EDT ----- ?I will get her in soon ?----- Message ----- ?From: Debbrah Alar, NP ?Sent: 03/06/2022   7:49 AM EDT ?To: Serafina Mitchell, MD ? ?Dr. Trula Slade, ? ?Can you please look at her ABI.  Severe R PVD. This pt is known to you. I have placed a referral request but wanted to bring this to your attention in case you want to work her in sooner. ? ?Thanks, ? ?Debbrah Alar NP ?[phone] ? ? ?

## 2022-03-16 ENCOUNTER — Ambulatory Visit (INDEPENDENT_AMBULATORY_CARE_PROVIDER_SITE_OTHER): Payer: Medicare Other | Admitting: Surgery

## 2022-03-16 ENCOUNTER — Other Ambulatory Visit: Payer: Self-pay | Admitting: Family

## 2022-03-16 ENCOUNTER — Encounter: Payer: Self-pay | Admitting: Surgery

## 2022-03-16 ENCOUNTER — Other Ambulatory Visit: Payer: Self-pay

## 2022-03-16 VITALS — BP 130/72 | HR 65 | Temp 98.2°F | Resp 20 | Ht 63.0 in | Wt 118.0 lb

## 2022-03-16 DIAGNOSIS — I70211 Atherosclerosis of native arteries of extremities with intermittent claudication, right leg: Secondary | ICD-10-CM | POA: Diagnosis not present

## 2022-03-16 NOTE — Progress Notes (Signed)
? ?[facility] [place] ? ?Patient name: Nancy [name] MRN: [id number] DOB: [birth year] Sex: female ? ? ?REASON FOR VISIT:  ? ? ?Follow up ? ?HISOTRY OF PRESENT ILLNESS:  ? ? ?Nancy [name] is a 81 y.o. female who I last saw for claudication in 2020.  At that time she found difficult to specifically describe her symptoms however her right leg bothers her more than the left and her symptoms appear to get worse with walking approximately 100 yards.  Her ABI was 0.5 on the right and 1 on the left.  She was started on a statin as well as cilostazol and exercise program.  She did not tolerate the cilostazol because of rehab.  She underwent angiography on 08/29/2019.  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  We discussed the possibility of an aortobifemoral bypass graft versus a left right femoral-femoral bypass, however she felt that her symptoms have been more manageable since her arteriogram, and so she did not undergo surgery. ? ?She continues to have right leg pain particular in the thigh with ambulation.  She does not have wounds or rest pain.  She is contemplating right knee replacement. ? ?The patient is medically managed for hypertension with an ARB.  She also suffers from rheumatoid arthritis which is been stable.  She continues to take her statin. ?PAST MEDICAL HISTORY:  ? ?Past Medical History:  ?Diagnosis Date  ? Allergy   ? Anemia   ? iron deficiency  ? 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 Cataract   ? Colon polyps   ? CVA (cerebral vascular accident) ([place]) 02/2017  ? Depression   ? Hyperlipidemia   ? Hypertension   ? Osteoporosis 01/01/2015  ? Rheumatoid arteritis ([place])   ? Thyroid disease   ? TIA (transient ischemic attack) 2016  ? Urinary incontinence   ? ? ? ?FAMILY HISTORY:  ? ?Family History  ?Problem Relation Age of Onset  ? Hypertension Father   ? Colon cancer Father   ? Ulcerative colitis Mother   ? Parkinson's  disease Mother   ? Heart disease Sister 56  ?     CABG x 3  ? Cirrhosis Brother   ? Alcohol abuse Brother   ? Colitis Daughter   ? Colon polyps Daughter   ? Heart attack Daughter   ? Hypertension Son   ? Kidney disease Son   ?     transplant due to kidney problems after [facility]  ? Arthritis Other   ? Coronary artery disease Other   ? Hyperlipidemia Other   ? Hypertension Other   ? Stroke Sister   ?     died 26  ? Arthritis Sister   ? Hypertension Sister   ? Thyroid disease Daughter   ?     x 3  ? Cirrhosis Brother 60  ?     ETOH abuse  ? ? ?SOCIAL HISTORY:  ? ?Social History  ? ?Tobacco Use  ? Smoking status: Some Days  ?  Packs/day: 0.50  ?  Years: 32.00  ?  Pack years: 16.00  ?  Types: Cigarettes  ? Smokeless tobacco: Never  ? Tobacco comments:  ?  6 or 7 per day  ?Substance Use Topics  ? Alcohol use: No  ?  Alcohol/week: 0.0 standard drinks  ? ? ? ?ALLERGIES:  ? ?Allergies  ?Allergen Reactions  ? Iron Nausea And Vomiting  ?  Can  take infusions ?  ? ? ? ?CURRENT MEDICATIONS:  ? ?Current Outpatient Medications  ?Medication Sig Dispense Refill  ? acetaminophen (TYLENOL) 500 MG tablet Take 500 mg by mouth every 6 (six) hours as needed for moderate pain.    ? albuterol (VENTOLIN HFA) 108 (90 Base) MCG/ACT inhaler Inhale 2 puffs into the lungs every 6 (six) hours as needed for wheezing or shortness of breath. 8 g 0  ? alendronate (FOSAMAX) 70 MG tablet Take 1 tablet (70 mg total) by mouth every 7 (seven) days. Take with a full glass of water on an empty stomach. 4 tablet 11  ? Calcium Carbonate-Vit D-Min (CALTRATE 600+D PLUS MINERALS) 600-800 MG-UNIT CHEW Chew 1 tablet by mouth in the morning and at bedtime. 60 tablet   ? carvedilol (COREG) 25 MG tablet Take 1 tablet (25 mg total) by mouth 2 (two) times daily. 180 tablet 1  ? fluticasone (FLONASE) 50 MCG/ACT nasal spray Place 1 spray into both nostrils daily as needed for allergies or rhinitis.    ? gabapentin (NEURONTIN) 100 MG capsule Take 2 capsules (200 mg  total) by mouth 3 (three) times daily. 180 capsule 3  ? HYDROcodone-acetaminophen (NORCO/VICODIN) 5-325 MG tablet Take 1 tablet by mouth every 4 (four) hours as needed for moderate pain. 30 tablet 0  ? losartan (COZAAR) 100 MG tablet Take 1 tablet (100 mg total) by mouth daily. 90 tablet 1  ? polyethylene glycol powder (GLYCOLAX/MIRALAX) 17 GM/SCOOP powder Take 17 g by mouth daily. (Patient taking differently: Take 17 g by mouth daily as needed for mild constipation.) 119 g 11  ? prednisoLONE acetate (PRED FORTE) 1 % ophthalmic suspension Place 1 drop into both eyes daily.     ? ?No current facility-administered medications for this visit.  ? ? ?REVIEW OF SYSTEMS:  ? ?'[X]'$  denotes positive finding, '[ ]'$  denotes negative finding ?Cardiac  Comments:  ?Chest pain or chest pressure:    ?Shortness of breath upon exertion:    ?Short of breath when lying flat:    ?Irregular heart rhythm:    ?    ?Vascular    ?Pain in calf, thigh, or hip brought on by ambulation: x   ?Pain in feet at night that wakes you up from your sleep:     ?Blood clot in your veins:    ?Leg swelling:     ?    ?Pulmonary    ?Oxygen at home:    ?Productive cough:     ?Wheezing:     ?    ?Neurologic    ?Sudden weakness in arms or legs:     ?Sudden numbness in arms or legs:     ?Sudden onset of difficulty speaking or slurred speech:    ?Temporary loss of vision in one eye:     ?Problems with dizziness:     ?    ?Gastrointestinal    ?Blood in stool:     ?Vomited blood:     ?    ?Genitourinary    ?Burning when urinating:     ?Blood in urine:    ?    ?Psychiatric    ?Major depression:     ?    ?Hematologic    ?Bleeding problems:    ?Problems with blood clotting too easily:    ?    ?Skin    ?Rashes or ulcers:    ?    ?Constitutional    ?Fever or chills:    ? ? ?PHYSICAL EXAM:  ? ?  Vitals:  ? 03/16/22 0903  ?BP: 130/72  ?Pulse: 65  ?Resp: 20  ?Temp: 98.2 ?F (36.8 ?C)  ?SpO2: 96%  ?Weight: 118 lb (53.5 kg)  ?Height: '5\' 3"'$  (1.6 m)  ? ? ?GENERAL: The patient is a  well-nourished female, in no acute distress. The vital signs are documented above. ?CARDIAC: There is a regular rate and rhythm.  ?VASCULAR: Nonpalpable right femoral or pedal pulses ?PULMONARY: Non-labored respirations ?MUSCULOSKELETAL: There are no major deformities or cyanosis. ?NEUROLOGIC: No focal weakness or paresthesias are detected. ?SKIN: There are no ulcers or rashes noted. ?PSYCHIATRIC: The patient has a normal affect. ? ?STUDIES:  ? ?I have reviewed the following: ? ?+-------+-----------+-----------+------------+------------+  ?ABI/TBIToday's ABIToday's TBIPrevious ABIPrevious TBI  ?+-------+-----------+-----------+------------+------------+  ?Right  0.45       0.24       0.51        0.43          ?+-------+-----------+-----------+------------+------------+  ?Left   1.12       0.62       1.01        0.68          ?+-------+-----------+-----------+------------+------------+  ? ?   ? ?MEDICAL ISSUES:  ? ?Right leg claudication: The patient continues to have symptoms in her right leg.  I had previously, 3 years ago, tried to recanalize her occluded right external iliac artery but was unsuccessful.  With her persistent symptoms, we discussed attempting to open up her artery again.  This potentially could require bilateral access.  This has been scheduled for Tuesday, May 16.  I would like to try to get this stented so as to avoid surgery. ? ? ? ?[name], IV, MD, FACS ?[facility] [place] ?Tel [phone] ?Pager [phone]  ?

## 2022-03-16 NOTE — H&P (View-Only) (Signed)
? ?[facility] [place] ? ?Patient name: Nancy [name] MRN: [id number] DOB: 1941/02/26 Sex: female ? ? ?REASON FOR VISIT:  ? ? ?Follow up ? ?HISOTRY OF PRESENT ILLNESS:  ? ? ?Nancy [name] is a 81 y.o. female who I last saw for claudication in 2020.  At that time she found difficult to specifically describe her symptoms however her right leg bothers her more than the left and her symptoms appear to get worse with walking approximately 100 yards.  Her ABI was 0.5 on the right and 1 on the left.  She was started on a statin as well as cilostazol and exercise program.  She did not tolerate the cilostazol because of rehab.  She underwent angiography on 08/29/2019.  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  We discussed the possibility of an aortobifemoral bypass graft versus a left right femoral-femoral bypass, however she felt that her symptoms have been more manageable since her arteriogram, and so she did not undergo surgery. ? ?She continues to have right leg pain particular in the thigh with ambulation.  She does not have wounds or rest pain.  She is contemplating right knee replacement. ? ?The patient is medically managed for hypertension with an ARB.  She also suffers from rheumatoid arthritis which is been stable.  She continues to take her statin. ?PAST MEDICAL HISTORY:  ? ?Past Medical History:  ?Diagnosis Date  ? Allergy   ? Anemia   ? iron deficiency  ? 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 Cataract   ? Colon polyps   ? CVA (cerebral vascular accident) ([place]) 02/2017  ? Depression   ? Hyperlipidemia   ? Hypertension   ? Osteoporosis 01/01/2015  ? Rheumatoid arteritis ([place])   ? Thyroid disease   ? TIA (transient ischemic attack) 2016  ? Urinary incontinence   ? ? ? ?FAMILY HISTORY:  ? ?Family History  ?Problem Relation Age of Onset  ? Hypertension Father   ? Colon cancer Father   ? Ulcerative colitis Mother   ? Parkinson's  disease Mother   ? Heart disease Sister 77  ?     CABG x 3  ? Cirrhosis Brother   ? Alcohol abuse Brother   ? Colitis Daughter   ? Colon polyps Daughter   ? Heart attack Daughter   ? Hypertension Son   ? Kidney disease Son   ?     transplant due to kidney problems after [facility]  ? Arthritis Other   ? Coronary artery disease Other   ? Hyperlipidemia Other   ? Hypertension Other   ? Stroke Sister   ?     died 10  ? Arthritis Sister   ? Hypertension Sister   ? Thyroid disease Daughter   ?     x 3  ? Cirrhosis Brother 69  ?     ETOH abuse  ? ? ?SOCIAL HISTORY:  ? ?Social History  ? ?Tobacco Use  ? Smoking status: Some Days  ?  Packs/day: 0.50  ?  Years: 32.00  ?  Pack years: 16.00  ?  Types: Cigarettes  ? Smokeless tobacco: Never  ? Tobacco comments:  ?  6 or 7 per day  ?Substance Use Topics  ? Alcohol use: No  ?  Alcohol/week: 0.0 standard drinks  ? ? ? ?ALLERGIES:  ? ?Allergies  ?Allergen Reactions  ? Iron Nausea And Vomiting  ?  Can  take infusions ?  ? ? ? ?CURRENT MEDICATIONS:  ? ?Current Outpatient Medications  ?Medication Sig Dispense Refill  ? acetaminophen (TYLENOL) 500 MG tablet Take 500 mg by mouth every 6 (six) hours as needed for moderate pain.    ? albuterol (VENTOLIN HFA) 108 (90 Base) MCG/ACT inhaler Inhale 2 puffs into the lungs every 6 (six) hours as needed for wheezing or shortness of breath. 8 g 0  ? alendronate (FOSAMAX) 70 MG tablet Take 1 tablet (70 mg total) by mouth every 7 (seven) days. Take with a full glass of water on an empty stomach. 4 tablet 11  ? Calcium Carbonate-Vit D-Min (CALTRATE 600+D PLUS MINERALS) 600-800 MG-UNIT CHEW Chew 1 tablet by mouth in the morning and at bedtime. 60 tablet   ? carvedilol (COREG) 25 MG tablet Take 1 tablet (25 mg total) by mouth 2 (two) times daily. 180 tablet 1  ? fluticasone (FLONASE) 50 MCG/ACT nasal spray Place 1 spray into both nostrils daily as needed for allergies or rhinitis.    ? gabapentin (NEURONTIN) 100 MG capsule Take 2 capsules (200 mg  total) by mouth 3 (three) times daily. 180 capsule 3  ? HYDROcodone-acetaminophen (NORCO/VICODIN) 5-325 MG tablet Take 1 tablet by mouth every 4 (four) hours as needed for moderate pain. 30 tablet 0  ? losartan (COZAAR) 100 MG tablet Take 1 tablet (100 mg total) by mouth daily. 90 tablet 1  ? polyethylene glycol powder (GLYCOLAX/MIRALAX) 17 GM/SCOOP powder Take 17 g by mouth daily. (Patient taking differently: Take 17 g by mouth daily as needed for mild constipation.) 119 g 11  ? prednisoLONE acetate (PRED FORTE) 1 % ophthalmic suspension Place 1 drop into both eyes daily.     ? ?No current facility-administered medications for this visit.  ? ? ?REVIEW OF SYSTEMS:  ? ?'[X]'$  denotes positive finding, '[ ]'$  denotes negative finding ?Cardiac  Comments:  ?Chest pain or chest pressure:    ?Shortness of breath upon exertion:    ?Short of breath when lying flat:    ?Irregular heart rhythm:    ?    ?Vascular    ?Pain in calf, thigh, or hip brought on by ambulation: x   ?Pain in feet at night that wakes you up from your sleep:     ?Blood clot in your veins:    ?Leg swelling:     ?    ?Pulmonary    ?Oxygen at home:    ?Productive cough:     ?Wheezing:     ?    ?Neurologic    ?Sudden weakness in arms or legs:     ?Sudden numbness in arms or legs:     ?Sudden onset of difficulty speaking or slurred speech:    ?Temporary loss of vision in one eye:     ?Problems with dizziness:     ?    ?Gastrointestinal    ?Blood in stool:     ?Vomited blood:     ?    ?Genitourinary    ?Burning when urinating:     ?Blood in urine:    ?    ?Psychiatric    ?Major depression:     ?    ?Hematologic    ?Bleeding problems:    ?Problems with blood clotting too easily:    ?    ?Skin    ?Rashes or ulcers:    ?    ?Constitutional    ?Fever or chills:    ? ? ?PHYSICAL EXAM:  ? ?  Vitals:  ? 03/16/22 0903  ?BP: 130/72  ?Pulse: 65  ?Resp: 20  ?Temp: 98.2 ?F (36.8 ?C)  ?SpO2: 96%  ?Weight: 118 lb (53.5 kg)  ?Height: '5\' 3"'$  (1.6 m)  ? ? ?GENERAL: The patient is a  well-nourished female, in no acute distress. The vital signs are documented above. ?CARDIAC: There is a regular rate and rhythm.  ?VASCULAR: Nonpalpable right femoral or pedal pulses ?PULMONARY: Non-labored respirations ?MUSCULOSKELETAL: There are no major deformities or cyanosis. ?NEUROLOGIC: No focal weakness or paresthesias are detected. ?SKIN: There are no ulcers or rashes noted. ?PSYCHIATRIC: The patient has a normal affect. ? ?STUDIES:  ? ?I have reviewed the following: ? ?+-------+-----------+-----------+------------+------------+  ?ABI/TBIToday's ABIToday's TBIPrevious ABIPrevious TBI  ?+-------+-----------+-----------+------------+------------+  ?Right  0.45       0.24       0.51        0.43          ?+-------+-----------+-----------+------------+------------+  ?Left   1.12       0.62       1.01        0.68          ?+-------+-----------+-----------+------------+------------+  ? ?   ? ?MEDICAL ISSUES:  ? ?Right leg claudication: The patient continues to have symptoms in her right leg.  I had previously, 3 years ago, tried to recanalize her occluded right external iliac artery but was unsuccessful.  With her persistent symptoms, we discussed attempting to open up her artery again.  This potentially could require bilateral access.  This has been scheduled for Tuesday, May 16.  I would like to try to get this stented so as to avoid surgery. ? ? ? ?[name], IV, MD, FACS ?[facility] [place] ?Tel [phone] ?Pager [phone]  ?

## 2022-03-24 ENCOUNTER — Ambulatory Visit (HOSPITAL_COMMUNITY): Admission: RE | Disposition: E | Payer: Self-pay | Source: Home / Self Care | Attending: Surgery

## 2022-03-24 ENCOUNTER — Encounter (HOSPITAL_COMMUNITY): Payer: Self-pay | Admitting: Certified Registered"

## 2022-03-24 ENCOUNTER — Other Ambulatory Visit: Payer: Self-pay

## 2022-03-24 ENCOUNTER — Ambulatory Visit (HOSPITAL_COMMUNITY): Payer: Medicare Other | Admitting: Certified Registered"

## 2022-03-24 ENCOUNTER — Encounter (HOSPITAL_COMMUNITY): Payer: Self-pay | Admitting: Surgery

## 2022-03-24 ENCOUNTER — Ambulatory Visit (HOSPITAL_COMMUNITY)
Admission: RE | Admit: 2022-03-24 | Discharge: 2022-04-09 | Disposition: E | Payer: Medicare Other | Attending: Surgery | Admitting: Surgery

## 2022-03-24 DIAGNOSIS — I97711 Intraoperative cardiac arrest during other surgery: Secondary | ICD-10-CM | POA: Diagnosis not present

## 2022-03-24 DIAGNOSIS — F17210 Nicotine dependence, cigarettes, uncomplicated: Secondary | ICD-10-CM | POA: Insufficient documentation

## 2022-03-24 DIAGNOSIS — I10 Essential (primary) hypertension: Secondary | ICD-10-CM | POA: Insufficient documentation

## 2022-03-24 DIAGNOSIS — M069 Rheumatoid arthritis, unspecified: Secondary | ICD-10-CM | POA: Diagnosis not present

## 2022-03-24 DIAGNOSIS — R092 Respiratory arrest: Secondary | ICD-10-CM | POA: Insufficient documentation

## 2022-03-24 DIAGNOSIS — I739 Peripheral vascular disease, unspecified: Secondary | ICD-10-CM | POA: Diagnosis not present

## 2022-03-24 DIAGNOSIS — Y839 Surgical procedure, unspecified as the cause of abnormal reaction of the patient, or of later complication, without mention of misadventure at the time of the procedure: Secondary | ICD-10-CM | POA: Diagnosis not present

## 2022-03-24 DIAGNOSIS — I779 Disorder of arteries and arterioles, unspecified: Secondary | ICD-10-CM | POA: Diagnosis not present

## 2022-03-24 DIAGNOSIS — I745 Embolism and thrombosis of iliac artery: Secondary | ICD-10-CM | POA: Insufficient documentation

## 2022-03-24 DIAGNOSIS — I70211 Atherosclerosis of native arteries of extremities with intermittent claudication, right leg: Secondary | ICD-10-CM | POA: Diagnosis not present

## 2022-03-24 DIAGNOSIS — D509 Iron deficiency anemia, unspecified: Secondary | ICD-10-CM | POA: Diagnosis not present

## 2022-03-24 DIAGNOSIS — E039 Hypothyroidism, unspecified: Secondary | ICD-10-CM | POA: Diagnosis not present

## 2022-03-24 HISTORY — PX: ABDOMINAL AORTOGRAM W/LOWER EXTREMITY: CATH118223

## 2022-03-24 HISTORY — PX: PERIPHERAL VASCULAR INTERVENTION: CATH118257

## 2022-03-24 LAB — POCT I-STAT 7, (LYTES, BLD GAS, ICA,H+H)
Acid-Base Excess: 15 mmol/L — ABNORMAL HIGH (ref 0.0–2.0)
Acid-base deficit: 11 mmol/L — ABNORMAL HIGH (ref 0.0–2.0)
Bicarbonate: 19.5 mmol/L — ABNORMAL LOW (ref 20.0–28.0)
Bicarbonate: 39 mmol/L — ABNORMAL HIGH (ref 20.0–28.0)
Calcium, Ion: 0.45 mmol/L — CL (ref 1.15–1.40)
Calcium, Ion: 0.82 mmol/L — CL (ref 1.15–1.40)
HCT: 24 % — ABNORMAL LOW (ref 36.0–46.0)
HCT: 34 % — ABNORMAL LOW (ref 36.0–46.0)
Hemoglobin: 11.6 g/dL — ABNORMAL LOW (ref 12.0–15.0)
Hemoglobin: 8.2 g/dL — ABNORMAL LOW (ref 12.0–15.0)
O2 Saturation: 100 %
O2 Saturation: 100 %
Potassium: 7.1 mmol/L (ref 3.5–5.1)
Potassium: 7.5 mmol/L (ref 3.5–5.1)
Sodium: 143 mmol/L (ref 135–145)
Sodium: 162 mmol/L (ref 135–145)
TCO2: 21 mmol/L — ABNORMAL LOW (ref 22–32)
TCO2: 40 mmol/L — ABNORMAL HIGH (ref 22–32)
pCO2 arterial: 43.6 mmHg (ref 32–48)
pCO2 arterial: 64.9 mmHg — ABNORMAL HIGH (ref 32–48)
pH, Arterial: 7.085 — CL (ref 7.35–7.45)
pH, Arterial: 7.56 — ABNORMAL HIGH (ref 7.35–7.45)
pO2, Arterial: 428 mmHg — ABNORMAL HIGH (ref 83–108)
pO2, Arterial: 536 mmHg — ABNORMAL HIGH (ref 83–108)

## 2022-03-24 LAB — POCT I-STAT, CHEM 8
BUN: 13 mg/dL (ref 8–23)
BUN: 9 mg/dL (ref 8–23)
Calcium, Ion: 1.25 mmol/L (ref 1.15–1.40)
Calcium, Ion: 0.8 mmol/L — CL (ref 1.15–1.40)
Chloride: 108 mmol/L (ref 98–111)
Chloride: 104 mmol/L (ref 98–111)
Creatinine, Ser: 1 mg/dL (ref 0.44–1.00)
Creatinine, Ser: 0.5 mg/dL (ref 0.44–1.00)
Glucose, Bld: 126 mg/dL — ABNORMAL HIGH (ref 70–99)
Glucose, Bld: >700 mg/dL (ref 70–99)
HCT: 32 % — ABNORMAL LOW (ref 36.0–46.0)
HCT: 32 % — ABNORMAL LOW (ref 36.0–46.0)
Hemoglobin: 10.9 g/dL — ABNORMAL LOW (ref 12.0–15.0)
Hemoglobin: 10.9 g/dL — ABNORMAL LOW (ref 12.0–15.0)
Potassium: 4 mmol/L (ref 3.5–5.1)
Potassium: 7.6 mmol/L (ref 3.5–5.1)
Sodium: 141 mmol/L (ref 135–145)
Sodium: 142 mmol/L (ref 135–145)
TCO2: 26 mmol/L (ref 22–32)
TCO2: 23 mmol/L (ref 22–32)

## 2022-03-24 LAB — BPAM CRYOPRECIPITATE
Blood Product Expiration Date: 202305161554
ISSUE DATE / TIME: 202305161016
Unit Type and Rh: 6200

## 2022-03-24 LAB — PREPARE CRYOPRECIPITATE: Unit division: 0

## 2022-03-24 LAB — PREPARE PLATELET PHERESIS: Unit division: 0

## 2022-03-24 LAB — BPAM PLATELET PHERESIS
Blood Product Expiration Date: 202305172359
Unit Type and Rh: 6200

## 2022-03-24 SURGERY — ABDOMINAL AORTOGRAM W/LOWER EXTREMITY
Anesthesia: LOCAL

## 2022-03-24 MED ORDER — CEFAZOLIN SODIUM-DEXTROSE 2-4 GM/100ML-% IV SOLN
INTRAVENOUS | Status: AC
  Filled 2022-03-24: qty 100

## 2022-03-24 MED ORDER — MIDAZOLAM HCL 2 MG/2ML IJ SOLN
INTRAMUSCULAR | Status: DC | PRN
  Administered 2022-03-24: 1 mg via INTRAVENOUS

## 2022-03-24 MED ORDER — MIDAZOLAM HCL 2 MG/2ML IJ SOLN
INTRAMUSCULAR | Status: AC
  Filled 2022-03-24: qty 2

## 2022-03-24 MED ORDER — ATROPINE SULFATE 1 MG/10ML IJ SOSY
PREFILLED_SYRINGE | INTRAMUSCULAR | Status: DC | PRN
  Administered 2022-03-24 (×3): 1 mg via INTRAVENOUS

## 2022-03-24 MED ORDER — EPINEPHRINE 1 MG/10ML IJ SOSY
PREFILLED_SYRINGE | INTRAMUSCULAR | Status: DC | PRN
  Administered 2022-03-24 (×4): 1 mg via INTRAVENOUS
  Administered 2022-03-24: .1 mg via INTRAVENOUS
  Administered 2022-03-24: 1 mg via INTRAVENOUS
  Administered 2022-03-24 (×2): .1 mg via INTRAVENOUS
  Administered 2022-03-24: 1 mg via INTRAVENOUS

## 2022-03-24 MED ORDER — EPINEPHRINE HCL 5 MG/250ML IV SOLN IN NS
INTRAVENOUS | Status: DC | PRN
  Administered 2022-03-24: 10 ug/min via INTRAVENOUS

## 2022-03-24 MED ORDER — LIDOCAINE HCL (PF) 1 % IJ SOLN
INTRAMUSCULAR | Status: AC
  Filled 2022-03-24: qty 30

## 2022-03-24 MED ORDER — HEPARIN (PORCINE) IN NACL 1000-0.9 UT/500ML-% IV SOLN
INTRAVENOUS | Status: AC
  Filled 2022-03-24: qty 1000

## 2022-03-24 MED ORDER — HEPARIN SODIUM (PORCINE) 1000 UNIT/ML IJ SOLN
INTRAMUSCULAR | Status: AC
  Filled 2022-03-24: qty 10

## 2022-03-24 MED ORDER — SODIUM BICARBONATE 8.4 % IV SOLN
INTRAVENOUS | Status: DC | PRN
  Administered 2022-03-24 (×6): 50 meq via INTRAVENOUS

## 2022-03-24 MED ORDER — EPINEPHRINE 1 MG/10ML IJ SOSY
PREFILLED_SYRINGE | INTRAMUSCULAR | Status: AC
  Filled 2022-03-24: qty 10

## 2022-03-24 MED ORDER — EPINEPHRINE 1 MG/10ML IJ SOSY
PREFILLED_SYRINGE | INTRAMUSCULAR | Status: AC
Start: 2022-03-24 — End: ?
  Filled 2022-03-24: qty 10

## 2022-03-24 MED ORDER — CEFAZOLIN SODIUM-DEXTROSE 2-3 GM-%(50ML) IV SOLR
INTRAVENOUS | Status: DC | PRN
  Administered 2022-03-24: 2 g via INTRAVENOUS

## 2022-03-24 MED ORDER — LIDOCAINE HCL (PF) 1 % IJ SOLN
INTRAMUSCULAR | Status: DC | PRN
  Administered 2022-03-24 (×2): 12 mL

## 2022-03-24 MED ORDER — FENTANYL CITRATE (PF) 100 MCG/2ML IJ SOLN
INTRAMUSCULAR | Status: AC
  Filled 2022-03-24: qty 2

## 2022-03-24 MED ORDER — FENTANYL CITRATE (PF) 100 MCG/2ML IJ SOLN
INTRAMUSCULAR | Status: DC | PRN
  Administered 2022-03-24: 50 ug via INTRAVENOUS

## 2022-03-24 MED ORDER — HEPARIN SODIUM (PORCINE) 1000 UNIT/ML IJ SOLN
INTRAMUSCULAR | Status: DC | PRN
  Administered 2022-03-24: 5000 [IU] via INTRAVENOUS

## 2022-03-24 MED ORDER — SODIUM CHLORIDE 0.9 % IV SOLN: INTRAVENOUS | Status: DC

## 2022-03-24 MED ORDER — ATROPINE SULFATE 1 MG/10ML IJ SOSY
PREFILLED_SYRINGE | INTRAMUSCULAR | Status: AC
  Filled 2022-03-24: qty 10

## 2022-03-24 MED ORDER — CALCIUM CHLORIDE 10 % IV SOLN
INTRAVENOUS | Status: DC | PRN
  Administered 2022-03-24 (×2): 1 g via INTRAVENOUS

## 2022-03-24 MED ORDER — INSULIN ASPART 100 UNIT/ML IJ SOLN
INTRAMUSCULAR | Status: DC | PRN
  Administered 2022-03-24 (×2): 10 [IU] via INTRAVENOUS

## 2022-03-24 MED ORDER — HEPARIN (PORCINE) IN NACL 1000-0.9 UT/500ML-% IV SOLN
INTRAVENOUS | Status: DC | PRN
Start: 2022-03-24 — End: 2022-03-24
  Administered 2022-03-24 (×2): 500 mL

## 2022-03-24 MED ORDER — IODIXANOL 320 MG/ML IV SOLN
INTRAVENOUS | Status: DC | PRN
  Administered 2022-03-24: 155 mL

## 2022-03-24 MED ORDER — DEXTROSE 50 % IV SOLN
INTRAVENOUS | Status: AC
  Filled 2022-03-24: qty 50

## 2022-03-24 SURGICAL SUPPLY — 31 items
BALLN MUSTANG 6.0X100X75 (BALLOONS) ×3
BALLN MUSTANG 7.0X20 75 (BALLOONS) ×3
BALLN MUSTANG 7X60X75 (BALLOONS) ×3
BALLOON MUSTANG 6.0X100X75 (BALLOONS) IMPLANT
BALLOON MUSTANG 7.0X20 75 (BALLOONS) IMPLANT
BALLOON MUSTANG 7X60X75 (BALLOONS) IMPLANT
CATH ANGIO 5F BER2 65CM (CATHETERS) ×1 IMPLANT
CATH OMNI FLUSH 5F 65CM (CATHETERS) ×1 IMPLANT
DEVICE ONE SNARE 15MM (VASCULAR PRODUCTS) ×1 IMPLANT
DEVICE TORQUE H2O (MISCELLANEOUS) ×1 IMPLANT
DEVICE VASC CLSR CELT ART 5 (Vascular Products) ×1 IMPLANT
DEVICE VASC CLSR CELT ART 7 (Vascular Products) ×1 IMPLANT
GLIDEWIRE ADV .035X260CM (WIRE) ×1 IMPLANT
GUIDEWIRE ANGLED .035X150CM (WIRE) ×1 IMPLANT
KIT ENCORE 26 ADVANTAGE (KITS) ×1 IMPLANT
KIT MICROPUNCTURE NIT STIFF (SHEATH) ×2 IMPLANT
KIT PV (KITS) ×3 IMPLANT
SHEATH BRITE TIP 6FR 35CM (SHEATH) ×1 IMPLANT
SHEATH BRITE TIP 7FR 35CM (SHEATH) ×2 IMPLANT
SHEATH FLEX ANSEL ANG 5F 45CM (SHEATH) ×1 IMPLANT
SHEATH PINNACLE 5F 10CM (SHEATH) ×1 IMPLANT
SHEATH PINNACLE 7F 10CM (SHEATH) ×2 IMPLANT
STENT ELUVIA 7X100X130 (Permanent Stent) ×1 IMPLANT
STENT ELUVIA 7X40X130 (Permanent Stent) ×1 IMPLANT
STENT VIABAHN 7X100X120 (Permanent Stent) ×1 IMPLANT
SYR MEDRAD MARK V 150ML (SYRINGE) ×1 IMPLANT
TRANSDUCER W/STOPCOCK (MISCELLANEOUS) ×3 IMPLANT
TRAY PV CATH (CUSTOM PROCEDURE TRAY) ×3 IMPLANT
WIRE BENTSON .035X145CM (WIRE) ×1 IMPLANT
WIRE G V18X300CM (WIRE) ×1 IMPLANT
WIRE SPARTACORE .014X300CM (WIRE) ×1 IMPLANT

## 2022-03-25 LAB — PREPARE FRESH FROZEN PLASMA
Unit division: 0
Unit division: 0
Unit division: 0
Unit division: 0
Unit division: 0
Unit division: 0
Unit division: 0
Unit division: 0

## 2022-03-25 LAB — BPAM FFP
Blood Product Expiration Date: 202305202359
Blood Product Expiration Date: 202305202359
Blood Product Expiration Date: 202305202359
Blood Product Expiration Date: 202305202359
Blood Product Expiration Date: 202305312359
Blood Product Expiration Date: 202306012359
Blood Product Expiration Date: 202306052359
Blood Product Expiration Date: 202306052359
ISSUE DATE / TIME: 202305160945
ISSUE DATE / TIME: 202305160945
Unit Type and Rh: 6200
Unit Type and Rh: 6200
Unit Type and Rh: 6200
Unit Type and Rh: 6200
Unit Type and Rh: 6200
Unit Type and Rh: 6200
Unit Type and Rh: 6200
Unit Type and Rh: 6200

## 2022-03-25 LAB — TYPE AND SCREEN
ABO/RH(D): AB POS
Antibody Screen: NEGATIVE
Unit division: 0
Unit division: 0
Unit division: 0
Unit division: 0
Unit division: 0
Unit division: 0
Unit division: 0
Unit division: 0
Unit division: 0
Unit division: 0
Unit division: 0
Unit division: 0
Unit division: 0
Unit division: 0
Unit division: 0
Unit division: 0
Unit division: 0
Unit division: 0

## 2022-03-25 LAB — BPAM RBC
Blood Product Expiration Date: 202305192359
Blood Product Expiration Date: 202305312359
Blood Product Expiration Date: 202305312359
Blood Product Expiration Date: 202306012359
Blood Product Expiration Date: 202306012359
Blood Product Expiration Date: 202306012359
Blood Product Expiration Date: 202306082359
Blood Product Expiration Date: 202306092359
Blood Product Expiration Date: 202306092359
Blood Product Expiration Date: 202306092359
Blood Product Expiration Date: 202306092359
Blood Product Expiration Date: 202306092359
Blood Product Expiration Date: 202306092359
Blood Product Expiration Date: 202306092359
Blood Product Expiration Date: 202306092359
Blood Product Expiration Date: 202306092359
Blood Product Expiration Date: 202306092359
Blood Product Expiration Date: 202306142359
ISSUE DATE / TIME: 202305160917
ISSUE DATE / TIME: 202305160929
ISSUE DATE / TIME: 202305160929
ISSUE DATE / TIME: 202305160929
ISSUE DATE / TIME: 202305160945
ISSUE DATE / TIME: 202305160945
ISSUE DATE / TIME: 202305160945
ISSUE DATE / TIME: 202305160945
ISSUE DATE / TIME: 202305160945
ISSUE DATE / TIME: 202305160945
ISSUE DATE / TIME: 202305161006
ISSUE DATE / TIME: 202305161006
ISSUE DATE / TIME: 202305161006
ISSUE DATE / TIME: 202305161006
ISSUE DATE / TIME: 202305161006
ISSUE DATE / TIME: 202305161006
ISSUE DATE / TIME: 202305161006
ISSUE DATE / TIME: 202305161444
Unit Type and Rh: 5100
Unit Type and Rh: 5100
Unit Type and Rh: 5100
Unit Type and Rh: 5100
Unit Type and Rh: 5100
Unit Type and Rh: 5100
Unit Type and Rh: 5100
Unit Type and Rh: 5100
Unit Type and Rh: 5100
Unit Type and Rh: 5100
Unit Type and Rh: 5100
Unit Type and Rh: 5100
Unit Type and Rh: 5100
Unit Type and Rh: 5100
Unit Type and Rh: 5100
Unit Type and Rh: 5100
Unit Type and Rh: 5100
Unit Type and Rh: 9500

## 2022-03-26 MED FILL — Dextrose Inj 50%: INTRAVENOUS | Qty: 50 | Status: AC

## 2022-03-28 DIAGNOSIS — M069 Rheumatoid arthritis, unspecified: Secondary | ICD-10-CM | POA: Diagnosis not present

## 2022-03-28 DIAGNOSIS — S72002D Fracture of unspecified part of neck of left femur, subsequent encounter for closed fracture with routine healing: Secondary | ICD-10-CM | POA: Diagnosis not present

## 2022-03-28 DIAGNOSIS — Z96642 Presence of left artificial hip joint: Secondary | ICD-10-CM | POA: Diagnosis not present

## 2022-03-31 ENCOUNTER — Telehealth: Payer: Self-pay

## 2022-03-31 MED FILL — Medication: Qty: 1 | Status: AC

## 2022-03-31 NOTE — Telephone Encounter (Signed)
Nancy [name] from [employer] [phone]) called- they have been trying to contact Dr. Trula Slade to get him to sign off on death certificate as she passed during surgery with him- they have not received a response and wants to know if Nancy [name] can sign off on death certificate.

## 2022-03-31 NOTE — Telephone Encounter (Signed)
Spoke with [employer]. Advised her that I will sign the death certificate. She will send it over to me in [place].

## 2022-04-09 NOTE — Transfer of Care (Signed)
Immediate Anesthesia Transfer of Care Note ? ?Patient: Nancy [name] ? ?Procedure(s) Performed: ABDOMINAL AORTOGRAM W/LOWER EXTREMITY ? ?Patient Location: pt intubated at code in [facility] ?

## 2022-04-09 NOTE — Interval H&P Note (Signed)
History and Physical Interval Note: ? ?04-09-2022 ?7:26 AM ? 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  Illac artery accul..  The various methods of treatment have been discussed with the patient and family. After consideration of risks, benefits and other options for treatment, the patient has consented to  Procedure(s): ?ABDOMINAL AORTOGRAM W/LOWER EXTREMITY (N/A) as a surgical intervention.  The patient's history has been reviewed, patient examined, no change in status, stable for surgery.  I have reviewed the patient's chart and labs.  Questions were answered to the patient's satisfaction.   ? ? ?[name] ? ? ?

## 2022-04-09 NOTE — Discharge Summary (Signed)
Nancy [name] is an 81 year old female with history of right leg claudication and known right external iliac artery occlusion.  Her symptoms had progressed to where she needed intervention.  She was scheduled for angiography.  She was able to successfully have the external iliac lesion crossed and stented.  Upon completion of stent placement, there was a rupture within the artery.  A balloon was quickly inflated to control the bleeding and ultimately a covered stent was placed.  During this time however the patient became unresponsive.  A code was called.  The patient was intubated.  MTP protocol was initiated.  The patient was transfused and given multiple rounds of pressors including an epi drip.  She had multiple rounds of chest compressions.  She would intermittently regain a blood pressure and heart rate and then, it would dissipate.  She was shocked 1 time for V-fib.  She received calcium insulin and glucose for hyperkalemia.  She was coded for over 16 minutes.  Ultimately we felt that this was not survivable. ? ?[name] ? ?

## 2022-04-09 NOTE — Op Note (Signed)
? ? ?Patient name: Nancy [name] MRN: [id number] DOB: 1941-03-01 Sex: female ? ?Apr 01, 2022 ?Pre-operative Diagnosis: Right leg claudication ?Post-operative diagnosis:  Same ?Surgeon:  [name] ?Procedure Performed: ? 1.  Ultrasound-guided access, left femoral artery ? 2.  Ultrasound-guided access, right femoral artery ? 3.  Abdominal aortogram ? 4.  Bilateral lower extremity runoff ? 5.  Stent, right external iliac artery ? 6.  Conscious sedation, 147 minutes ? 7.  Ultrasound-guided central venous line placement into the left common femoral vein ? 8.  Closure device, Celt ? ? ?Indications: This is an 81 year old female with right leg claudication.  She has a known occlusion of the right external iliac artery that was unable to be crossed 3 years ago.  She is now having worsening symptoms and wants to proceed with intervention. ? ?Procedure:  The patient was identified in the holding area and taken to room 8.  The patient was then placed supine on the table and prepped and draped in the usual sterile fashion.  A time out was called.  Conscious sedation was administered with the use of IV fentanyl and Versed under continuous physician and nurse monitoring.  Heart rate, blood pressure, and oxygen saturation were continuously monitored.  Total sedation time was 147 minutes.  Ultrasound was used to evaluate the left common femoral artery.  It was patent .  A digital ultrasound image was acquired.  A micropuncture needle was used to access the left common femoral artery under ultrasound guidance.  An 018 wire was advanced without resistance and a micropuncture sheath was placed.  The 018 wire was removed and a benson wire was placed.  The micropuncture sheath was exchanged for a 5 french sheath.  An omniflush catheter was advanced over the wire to the level of L-1.  An abdominal angiogram was obtained.  Next, the catheter was pulled out of the aortic bifurcation and bilateral runoff was performed. ?Findings:   ? Aortogram: No significant renal artery stenosis.  The infrarenal abdominal aorta is heavily calcified but patent without stenosis.  The left common and external iliac artery heavily calcified without hemodynamically significant stenosis.  The right common iliac artery is heavily calcified but patent without stenosis.  The internal iliac artery on the right is widely patent.  There is occlusion of the right external iliac artery at its origin with reconstitution at the level of the femoral head ? Right Lower Extremity: The right common femoral profundofemoral and superficial femoral artery are heavily calcified but patent without significant stenosis.  The popliteal artery is patent throughout its course with three-vessel runoff. ? Left Lower Extremity: The left common femoral profundofemoral and superficial femoral artery are patent without significant stenosis.  The popliteal artery is widely patent with three-vessel runoff ? ?Intervention: After the above images were acquired the decision was made to proceed with intervention.  I then elected to get access in the right groin.  The common femoral was evaluated with ultrasound.  It was calcified but patent.  The artery was then cannulated under ultrasound guidance with a micropuncture needle.  A 018 wire Was advanced resistance and a micropuncture sheath was placed.  Next, a 035 wire was inserted followed by placement of a 6 French Brite tip sheath.  The patient was fully heparinized.  I then used a Glidewire advantage to try and perform recanalization.  This ended up in a dissection plane.  Next, I returned to the sheath in the left groin and crossed over the bifurcation and placed  a 5 French 45 cm sheath into the right common iliac artery.  Then using a Glidewire and a Berenstein 2 catheter, I was able to get into the stump of the external iliac artery.  The wire was then advanced into the femoral artery.  I confirmed successful recanalization with a contrast  injection.  I then placed a 10 mm snare through the sheath in the right groin and snared the Glidewire coming over from the left.  The wire was brought out through the 6 French sheath in the right groin.  A Berenstein 2 catheter was loaded over this wire and advanced up into the aorta.  I then inserted a Glidewire advantage.  The dilator was placed on the bright tip sheath and was then advanced into the common iliac artery.  A contrast injection was performed showing that I was in the true lumen.  I elected to primarily stent this.  I selected a 7 x 100 Elluvia stent and deployed this in the external iliac artery and postdilated it with a 6 mm balloon.  There was still another 2 cm of untreated artery and so then I inserted a 7 x 40 distal extension that landed at the top of the femoral head.  I then dilated this with a 6 mm balloon.  There was still narrowing within the midportion of the stent.  I then upsized to a 7 x 60 Mustang balloon to postdilate the narrowed midportion.  Upon completion a contrast injection was performed that showed rupture of the external iliac artery.  I had not taken the 7 x 60 balloon out of the artery yet and so I quickly reinflated this.  I then set up to place a covered stent.  I came from the left side and got wire access into the iliac artery so that a 7 x 20 balloon was able to be inflated for control.  I had to exchange out the sheath in the right groin for a 7 French sheath and then inserted a V-18 wire.  Once this was in place, a 7 x 100 Viabahn stent was deployed and then postdilated.  Completion imaging showed no further bleeding. ? ?During the time after the rupture, the patient became unresponsive.  A code ended up being called.  She was intubated.  She was aggressively resuscitated with pressors as well as blood transfusion using the MTP protocol.  We were able to get her back several times however ultimately attempts at resuscitation were unsuccessful.  We coded her for  over an hour.  During the code, I placed a 7 French venous sheath under ultrasound guidance in the left common femoral vein and I also closed the sheath in the right groin with a Celt ? ?Impression: ? #1  Right external iliac artery occlusion successfully crossed and stented with drug-eluting stents.  All upon post dilation there was a tear in the artery which was treated with covered stent placement.  During this time, the patient arrested and was unable to be resuscitated despite all of our efforts, she expired on the table. ?  ? ?V. [name], M.D., FACS ?[facility] ?Office: [phone] ?Pager:  [phone]  ?

## 2022-04-09 NOTE — Progress Notes (Signed)
Chaplain provided presence at Cath lab as medical team worked to stabilize Nancy [name]. Chaplain provided reflective presence with pt's daughters Tye [place] and Hilda Blades as they learned that their mother did not survive her procedure and remained with the family for 3 hours as they notified their relatives. Chaplain engaged family in life review and provided hospitality and coordination of visitations as many members from the extended family arrived at the hospital to say good bye to their mother. The family celebrated Mother's 80 and Mrs Olivier 81st birthday together this weekend. ? ?Please page as further needs arise. ? ?Donald Prose. Elyn Peers, M.Div. [facility] ?Chaplain ?Pager [phone] ?Office [phone] ? ?

## 2022-04-09 NOTE — Progress Notes (Signed)
Emergency transfusion and Rapid Administration of FFP was given as follows: ? ?Unit # [id number] ?  Thawed Plasma ? Infusion Date: 2022-04-11 ? Exp. Date: 03/28/2022 ? Start time: 1005 End Time 1005 ? Amt. Transfused: 331 ? ?2. Unit # [id number] E ? Thawed Plasma ? Infusion Date: 04-11-2022 ? Exp. Date: 5/20/202 ? Start time: 1007 End Time: 1007 ? Amt. Transfused: 348 ? ?

## 2022-04-09 NOTE — Progress Notes (Signed)
Blood transfusions Emergency Release and Rapid Administration was given as follows: ? ?Unit # [id number] M ?O negative ? Infusion Date: Mar 30, 2022 ?Exp. Date 03/27/2022 ?Start Time: [place] End Time 6333 ?Amt. Transfused: 315 ? ?2. Unit # [id number] R ? O positive ( Positive for C Antigen) ? Infusion Date: 04/17/2022 ? Exp. Date 03/27/2022 ? Start Time: 0936 End Time: 4287 ? Amt. Transfused:315 ? ?3. Unit #[id number] ? O positive ? Infusion Date Mar 30, 2022 ? Exp. Date 04/17/2022 ? Start time: [place] End Time 0938 ? Amt. Transfused:315 ? ?4. Unit #[id number] ? O positive ? Infusion Date Mar 30, 2022 ? Exp. Date 04/16/2022 ? Start Time: 0940 End Time: 3212 ? Amt. Transfused: 315 ? ?5. Unit #[id number] ? O positive ? Infusion Date: 03-30-22 ? Exp. Date: 04/08/2022 ? Start time: [place] End Time 0950 ? Amt: Transfused: 315 ? ?6. Unit #[id number] S ? O positive ? Infusion Date: 2022-03-30 ? Exp: Date 04/08/2022 ? Start time: [place] End Time 0952 ? Amt Transfused: 315 ? ?7. Unit # [id number] ? O positive ? Infusion Date: 03/30/22 ? Exp. Date: 04/09/2022 ? Start Time: [place] End Time 7948 ? Amt Transfused: 315 ? ?8. Unit # [id number] O ? O positive ? Infusion Date: 30-Mar-2022 ? Exp. Date: 04/09/2022 ? Start Time: 0956 End Time 0956 ? Amt Transfused: 285 ? ?9. Unit # [id number] G ? O positive ? Infusion Date Mar 30, 2022 ? Exp. Date: 04/22/2022 ? Start Time: 1219 End Time: 0958 ? Amt Transfused: 315 ? ?10. Unit # [id number] C ? O positive ? Infusion Date: 30-Mar-2022 ? Exp. Date: 04/09/2022 ? Start Time: 1000 End Time: 1000 ? Amt. Transfused 315 ? ? ?  ?

## 2022-04-09 NOTE — Progress Notes (Signed)
?  2022/04/15 1350  ?Clinical Encounter Type  ?Visited With Family;Health care provider ?Kelton Pillar, Medical Examiner's Representative)  ?Visit Type Death  ?Referral From Chaplain;Family ?(ChaplainAmanda Davee Lomax; Later paged byPaged by 2[employer] Receptionist)  ?Consult/Referral To Chaplain ?Albertina Parr [place])  ? ?Met with Amariyah Bazar family regarding pre-arranged viewing of deceased in [place]. ?Chaplain provided condolences and grief support to patient's family. ?Coordinated families viewing of deceased with Kelton Pillar, Medical Examiner's Representative. ?Escorted six of Ms. Racette's family members to morgue and then escorted them back to [employer]. ?[address] [place], [place] [phone]. ?

## 2022-04-09 NOTE — Progress Notes (Signed)
This chaplain responded to the Pt. Code Blue in the Cath Lab with the medical team. The chaplain remained outside the room as the team cared for the Pt. The chaplain updated Orbie Pyo as she stepped in to provide ongoing spiritual care for the Pt. and medical team. ? ?Chaplain Sallyanne Kuster ?[phone] ? ?

## 2022-04-09 NOTE — Anesthesia Procedure Notes (Signed)
Procedure Name: Intubation ?Date/Time: 04/05/2022 9:19 AM ?Performed by: Lance Coon, CRNA ?Pre-anesthesia Checklist: Patient identified, Emergency Drugs available, Suction available, Patient being monitored and Timeout performed ?Patient Re-evaluated:Patient Re-evaluated prior to induction ?Preoxygenation: Pre-oxygenation with 100% oxygen ?Ventilation: Mask ventilation without difficulty ?Laryngoscope Size: Sabra Heck and 3 ?Grade View: Grade II ?Tube type: Oral ?Tube size: 7.0 mm ?Number of attempts: 1 ?Airway Equipment and Method: Stylet ?Placement Confirmation: ETT inserted through vocal cords under direct vision, positive ETCO2 and breath sounds checked- equal and bilateral ?Secured at: 20 cm ?Tube secured with: Tape ?Dental Injury: Teeth and Oropharynx as per pre-operative assessment  ? ? ? ? ?

## 2022-04-09 NOTE — Progress Notes (Signed)
?   04/09/2022 1232  ?Clinical Encounter Type  ?Visited With Health care provider;[name]  ?Visit Type Death  ?Referral From Chaplain ?(Karsten Ro [place])  ?Consult/Referral To Chaplain ?Albertina Parr [place])  ?Spiritual Encounters  ?Spiritual Needs Emotional;Grief support  ? ?Introduced to the [name] of Nancy [name] in the the PACU Consultation Room following her death. ?Offered condolences and grief support to the fifteen members of Nancy [name]'s [name] gathered by Nancy [name].  The Nancy [name] [name] found comfort and emotional support within one another's presence as they shared stories of their loved one, Nancy [name], and planned for the celebration of her life. ? ?The Nancy [name] has chosen [employer], [address], [place], [place] to handle funeral planning. Chaplain provided [name] with Patient Placement contact information. ?Chaplain provided the [name] space and time to grieve Nancy [name] death. ?[name] will have [place] paged if further assistance is required. ?[address] [place], [place] [phone]. ?

## 2022-04-09 DEATH — deceased

## 2022-04-10 ENCOUNTER — Ambulatory Visit: Payer: Medicare Other | Admitting: Family

## 2022-05-26 ENCOUNTER — Ambulatory Visit: Payer: Medicare Other | Admitting: Family

## 2022-11-10 ENCOUNTER — Ambulatory Visit: Payer: Medicare Other
